# Patient Record
Sex: Female | Born: 1960 | Race: Asian | Hispanic: No | Marital: Married | Smoking: Former smoker
Health system: Southern US, Community
[De-identification: ages and names within clinical notes are randomized; demographics above are authoritative.]

## PROBLEM LIST (undated history)

## (undated) DIAGNOSIS — C541 Malignant neoplasm of endometrium: Secondary | ICD-10-CM

## (undated) DIAGNOSIS — D649 Anemia, unspecified: Secondary | ICD-10-CM

## (undated) DIAGNOSIS — I10 Essential (primary) hypertension: Secondary | ICD-10-CM

## (undated) DIAGNOSIS — C189 Malignant neoplasm of colon, unspecified: Secondary | ICD-10-CM

## (undated) DIAGNOSIS — R42 Dizziness and giddiness: Secondary | ICD-10-CM

## (undated) DIAGNOSIS — J301 Allergic rhinitis due to pollen: Secondary | ICD-10-CM

## (undated) DIAGNOSIS — M1990 Unspecified osteoarthritis, unspecified site: Secondary | ICD-10-CM

## (undated) DIAGNOSIS — T753XXA Motion sickness, initial encounter: Secondary | ICD-10-CM

## (undated) HISTORY — PX: ABDOMINAL HYSTERECTOMY: SHX81

## (undated) HISTORY — DX: Essential (primary) hypertension: I10

## (undated) HISTORY — DX: Allergic rhinitis due to pollen: J30.1

## (undated) HISTORY — DX: Malignant neoplasm of endometrium: C54.1

## (undated) MED FILL — Fosaprepitant Dimeglumine For IV Infusion 150 MG (Base Eq): INTRAVENOUS | Qty: 5 | Status: AC

---

## 2009-01-26 ENCOUNTER — Ambulatory Visit: Payer: Self-pay

## 2009-02-16 ENCOUNTER — Ambulatory Visit: Payer: Self-pay | Admitting: Pain Medicine

## 2009-03-01 ENCOUNTER — Ambulatory Visit: Payer: Self-pay | Admitting: Pain Medicine

## 2009-07-18 ENCOUNTER — Ambulatory Visit: Payer: Self-pay | Admitting: Family Medicine

## 2012-10-06 ENCOUNTER — Other Ambulatory Visit (HOSPITAL_COMMUNITY)
Admission: RE | Admit: 2012-10-06 | Discharge: 2012-10-06 | Disposition: A | Discharge status: Home / Self Care | Payer: BC Managed Care – PPO | Source: Ambulatory Visit | Attending: Family Medicine | Admitting: Family Medicine

## 2012-10-06 ENCOUNTER — Ambulatory Visit (INDEPENDENT_AMBULATORY_CARE_PROVIDER_SITE_OTHER): Payer: BC Managed Care – PPO | Admitting: Family Medicine

## 2012-10-06 ENCOUNTER — Encounter: Payer: Self-pay | Admitting: Family Medicine

## 2012-10-06 VITALS — BP 120/70 | HR 72 | Temp 98.2°F | Ht 62.5 in | Wt 162.5 lb

## 2012-10-06 DIAGNOSIS — R5383 Other fatigue: Secondary | ICD-10-CM

## 2012-10-06 DIAGNOSIS — Z01419 Encounter for gynecological examination (general) (routine) without abnormal findings: Secondary | ICD-10-CM

## 2012-10-06 DIAGNOSIS — Z1239 Encounter for other screening for malignant neoplasm of breast: Secondary | ICD-10-CM

## 2012-10-06 DIAGNOSIS — Z0000 Encounter for general adult medical examination without abnormal findings: Secondary | ICD-10-CM

## 2012-10-06 DIAGNOSIS — Z131 Encounter for screening for diabetes mellitus: Secondary | ICD-10-CM

## 2012-10-06 DIAGNOSIS — R5381 Other malaise: Secondary | ICD-10-CM

## 2012-10-06 DIAGNOSIS — J301 Allergic rhinitis due to pollen: Secondary | ICD-10-CM | POA: Insufficient documentation

## 2012-10-06 DIAGNOSIS — Z13220 Encounter for screening for lipoid disorders: Secondary | ICD-10-CM

## 2012-10-06 LAB — CBC WITH DIFFERENTIAL/PLATELET
Eosinophils Relative: 1.2 % (ref 0.0–5.0)
HCT: 27.9 % — ABNORMAL LOW (ref 36.0–46.0)
Lymphs Abs: 1.3 10*3/uL (ref 0.7–4.0)
Monocytes Relative: 9.1 % (ref 3.0–12.0)
Platelets: 314 10*3/uL (ref 150.0–400.0)
WBC: 4 10*3/uL — ABNORMAL LOW (ref 4.5–10.5)

## 2012-10-06 LAB — LDL CHOLESTEROL, DIRECT: Direct LDL: 127 mg/dL

## 2012-10-06 LAB — HEPATIC FUNCTION PANEL
ALT: 24 U/L (ref 0–35)
AST: 20 U/L (ref 0–37)
Alkaline Phosphatase: 32 U/L — ABNORMAL LOW (ref 39–117)
Bilirubin, Direct: 0 mg/dL (ref 0.0–0.3)
Total Bilirubin: 0.4 mg/dL (ref 0.3–1.2)

## 2012-10-06 LAB — BASIC METABOLIC PANEL
BUN: 10 mg/dL (ref 6–23)
GFR: 93.22 mL/min (ref >60.00)
Potassium: 3.8 mEq/L (ref 3.5–5.1)
Sodium: 140 mEq/L (ref 135–145)

## 2012-10-06 NOTE — Progress Notes (Signed)
Nature conservation officer at [facility] [address] [place] [address] Phone: [phone] Fax: [phone]  Date:  10/06/2012   Name:  Jaime Johnson   DOB:  1961/03/27   MRN:  [id number] Gender: female Age: 51 y.o.  PCP:  Hannah Beat, MD  Evaluating MD: Hannah Beat, MD   Chief Complaint: Establish Care   History of Present Illness:  Jaime Johnson is a 51 y.o. pleasant patient who presents with the following:  Used to go to Dr. Lorri Frederick in [place], had all checkups with him. Needs a mammogram.  Colonoscopy - declines referral.  Mammo - needs referral.   Health Maintenance Summary Reviewed and updated, unless pt declines services.  Tobacco History Reviewed. Non-smoker  Alcohol: No concerns, no excessive use Exercise Habits: Some activity STD concerns: none Drug Use: None Lumps or breast concerns: no  Patient Active Problem List  Diagnosis  . Allergic rhinitis due to pollen    Past Medical History  Diagnosis Date  . Allergic rhinitis due to pollen     Past Surgical History  Procedure Date  . Cesarean section     History  Substance Use Topics  . Smoking status: Former Games developer  . Smokeless tobacco: Not on file  . Alcohol Use: No    Family History  Problem Relation Age of Onset  . Hepatitis C Mother 60    d/c at 50  . Hepatitis C Brother   . Hypertension Father     No Known Allergies  Current Outpatient Prescriptions on File Prior to Visit  Medication Sig Dispense Refill  . loratadine (ALAVERT) 10 MG tablet Take 10 mg by mouth daily.         Review of Systems:   General: Denies fever, chills, sweats. No significant weight loss. Eyes: Denies blurring,significant itching ENT: Denies earache, sore throat, and hoarseness.  Cardiovascular: Denies chest pains, palpitations, dyspnea on exertion,  Respiratory: Denies cough, dyspnea at rest,wheeezing Breast: no concerns about lumps GI: Denies nausea, vomiting, diarrhea, constipation, change  in bowel habits, abdominal pain, melena, hematochezia GU: Denies dysuria, hematuria, urinary hesitancy, nocturia, denies STD risk, no concerns about discharge Musculoskeletal: Denies back pain, joint pain Derm: Denies rash, itching Neuro: Denies  paresthesias, frequent falls, frequent headaches Psych: Denies depression, anxiety Endocrine: Denies cold intolerance, heat intolerance, polydipsia Heme: Denies enlarged lymph nodes Allergy: No hayfever  Physical Examination: Filed Vitals:   10/06/12 1352  BP: 120/70  Pulse: 72  Temp: 98.2 F (36.8 C)  TempSrc: Oral  Height: 5' 2.5" (1.588 m)  Weight: 162 lb 8 oz (73.71 kg)  SpO2: 99%    Body mass index is 29.25 kg/(m^2). Ideal Body Weight: Weight in (lb) to have BMI = 25: 138.6    Wt Readings from Last 3 Encounters:  10/06/12 162 lb 8 oz (73.71 kg)    GEN: well developed, well nourished, no acute distress Eyes: conjunctiva and lids normal, PERRLA, EOMI ENT: TM clear, nares clear, oral exam WNL Neck: supple, no lymphadenopathy, no thyromegaly, no JVD Pulm: clear to auscultation and percussion, respiratory effort normal CV: regular rate and rhythm, S1-S2, no murmur, rub or gallop, no bruits Chest: no scars, masses, no lumps BREAST: no lumps, no axillary LAD, no nipple discharge GI: soft, non-tender; no hepatosplenomegaly, masses; active bowel sounds all quadrants GU: Normal external female genitalia. Cervix appears intact without lesions or irritation. Vaginal canal normal without ulceration or lesion. Cervix NT to exam. Ovaries neither enlarged nor tender. Lymph: no cervical, axillary or inguinal  adenopathy MSK: gait normal, muscle tone and strength WNL, no joint swelling, effusions, discoloration, crepitus  SKIN: clear, good turgor, color WNL, no rashes, lesions, or ulcerations Neuro: normal mental status, normal strength, sensation, and motion Psych: alert; oriented to person, place and time, normally interactive and not  anxious or depressed in appearance.  Assessment and Plan:  1. Routine general medical examination at a health care facility    2. Breast screening, unspecified  MM Digital Screening  3. Routine gynecological examination  Cytology - PAP  4. Screening for lipoid disorders    5. Screening for diabetes mellitus    6. Allergic rhinitis due to pollen     The patient's preventative maintenance and recommended screening tests for an annual wellness exam were reviewed in full today. Brought up to date unless services declined.  Counselled on the importance of diet, exercise, and its role in overall health and mortality. The patient's FH and SH was reviewed, including their home life, tobacco status, and drug and alcohol status.    Orders Today:  Orders Placed This Encounter  Procedures  . MM Digital Screening    Standing Status: Future     Number of Occurrences:      Standing Expiration Date: 12/06/2013    Order Specific Question:  Is the patient pregnant?    Answer:  No    Order Specific Question:  Preferred imaging location?    Answer:  External    Order Specific Question:  Reason for exam:    Answer:  [facility], screening mammo    Updated Medication List: (Includes new medications, updates to list, dose adjustments) Meds ordered this encounter  Medications  . loratadine (ALAVERT) 10 MG tablet    Sig: Take 10 mg by mouth daily.    Medications Discontinued: There are no discontinued medications.   Hannah Beat, MD

## 2012-10-06 NOTE — Addendum Note (Signed)
Addended by: Hannah Beat on: 10/06/2012 02:39 PM   Modules accepted: Orders

## 2012-10-07 ENCOUNTER — Ambulatory Visit: Payer: BC Managed Care – PPO

## 2012-10-07 DIAGNOSIS — D649 Anemia, unspecified: Secondary | ICD-10-CM

## 2012-10-07 LAB — IBC PANEL: Saturation Ratios: 2.4 % — ABNORMAL LOW (ref 20.0–50.0)

## 2012-10-13 ENCOUNTER — Encounter: Payer: Self-pay | Admitting: Family Medicine

## 2012-10-13 ENCOUNTER — Ambulatory Visit: Payer: Self-pay | Admitting: Family Medicine

## 2012-11-14 ENCOUNTER — Telehealth: Payer: Self-pay | Admitting: Family Medicine

## 2012-11-14 NOTE — Telephone Encounter (Signed)
Patient Information:  Caller Name: Early[place]  Phone: [phone]  Patient: Jaime Johnson, Jaime Johnson  Gender: Female  DOB: 04/10/1969  Age: 52 Years  PCP: Hannah Beat (Family Practice)  Pregnant: No  Office Follow Up:  Does the office need to follow up with this patient?: No  Instructions For The Office: N/A   Symptoms  Reason For Call & Symptoms: Pt calling that she has a cold x 1 week, since 11/07/12.   Started with a sore throat  and now she has a cough and is coughing so hard she is having urinary leakage.  She has tried some OTC couch meds and Alka Seltzer cold med.   She is calling to get an antibiotic and a cough med like Tussionex.  Can anything be called in for her?   Reviewed Health History In EMR: Yes  Reviewed Medications In EMR: Yes  Reviewed Allergies In EMR: N/A  Reviewed Surgeries / Procedures: Yes  Date of Onset of Symptoms: 11/07/2012  Treatments Tried: OTC cough meds and Alka Seltzer Cold  Treatments Tried Worked: No OB / GYN:  LMP: 10/28/2012  Guideline(s) Used:  Cough  Disposition Per Guideline:   See Today or Tomorrow in Office  Reason For Disposition Reached:   Continuous (nonstop) coughing interferes with work or school and no improvement using cough treatment per Care Advice  Advice Given:  Home care advice given for cough, steam, push fluids, rest and monitor temp. Call back instructions given.  Patient Refused Recommendation:  Patient Requests Prescription  Pt requesting an antibiotic and something like Tussionex for cough.  She does not wnat to go out in the cold to be seen because that makes the cough worse.

## 2012-11-17 NOTE — Telephone Encounter (Signed)
If she is still symptomatic, she should have office visit to check out -- sorry, but against office policy to ever call in ABX over phone

## 2012-11-17 NOTE — Telephone Encounter (Signed)
Patient advised via message on machine she will need appt before antibiotics can be given

## 2014-05-26 ENCOUNTER — Encounter: Payer: Self-pay | Admitting: Family Medicine

## 2014-05-26 ENCOUNTER — Ambulatory Visit (INDEPENDENT_AMBULATORY_CARE_PROVIDER_SITE_OTHER): Payer: BC Managed Care – PPO | Admitting: Family Medicine

## 2014-05-26 VITALS — BP 130/72 | HR 68 | Temp 97.8°F | Ht 62.5 in | Wt 153.5 lb

## 2014-05-26 DIAGNOSIS — R5381 Other malaise: Secondary | ICD-10-CM

## 2014-05-26 DIAGNOSIS — Z1211 Encounter for screening for malignant neoplasm of colon: Secondary | ICD-10-CM

## 2014-05-26 DIAGNOSIS — Z0000 Encounter for general adult medical examination without abnormal findings: Secondary | ICD-10-CM

## 2014-05-26 DIAGNOSIS — N950 Postmenopausal bleeding: Secondary | ICD-10-CM

## 2014-05-26 DIAGNOSIS — Z13220 Encounter for screening for lipoid disorders: Secondary | ICD-10-CM

## 2014-05-26 DIAGNOSIS — R5383 Other fatigue: Secondary | ICD-10-CM

## 2014-05-26 DIAGNOSIS — D62 Acute posthemorrhagic anemia: Secondary | ICD-10-CM

## 2014-05-26 LAB — CBC WITH DIFFERENTIAL/PLATELET
BASOS ABS: 0 10*3/uL (ref 0.0–0.1)
Basophils Relative: 0.4 % (ref 0.0–3.0)
Eosinophils Absolute: 0 10*3/uL (ref 0.0–0.7)
Eosinophils Relative: 0.7 % (ref 0.0–5.0)
HEMATOCRIT: 41 % (ref 36.0–46.0)
Hemoglobin: 14 g/dL (ref 12.0–15.0)
LYMPHS ABS: 1.5 10*3/uL (ref 0.7–4.0)
Lymphocytes Relative: 22.5 % (ref 12.0–46.0)
MCHC: 34 g/dL (ref 30.0–36.0)
MCV: 78.9 fl (ref 78.0–100.0)
MONOS PCT: 6.4 % (ref 3.0–12.0)
Monocytes Absolute: 0.4 10*3/uL (ref 0.1–1.0)
Neutro Abs: 4.6 10*3/uL (ref 1.4–7.7)
Neutrophils Relative %: 70 % (ref 43.0–77.0)
PLATELETS: 251 10*3/uL (ref 150.0–400.0)
RBC: 5.2 Mil/uL — ABNORMAL HIGH (ref 3.87–5.11)
RDW: 16.4 % — AB (ref 11.5–15.5)
WBC: 6.5 10*3/uL (ref 4.0–10.5)

## 2014-05-26 LAB — HEPATIC FUNCTION PANEL
ALK PHOS: 35 U/L — AB (ref 39–117)
ALT: 26 U/L (ref 0–35)
AST: 25 U/L (ref 0–37)
Albumin: 3.9 g/dL (ref 3.5–5.2)
BILIRUBIN DIRECT: 0 mg/dL (ref 0.0–0.3)
TOTAL PROTEIN: 6.9 g/dL (ref 6.0–8.3)
Total Bilirubin: 0.4 mg/dL (ref 0.2–1.2)

## 2014-05-26 LAB — BASIC METABOLIC PANEL
BUN: 9 mg/dL (ref 6–23)
CALCIUM: 8.9 mg/dL (ref 8.4–10.5)
CO2: 26 meq/L (ref 19–32)
Chloride: 103 mEq/L (ref 96–112)
Creatinine, Ser: 0.9 mg/dL (ref 0.4–1.2)
GFR: 74.04 mL/min (ref >60.00)
Glucose, Bld: 93 mg/dL (ref 70–99)
Potassium: 4.1 mEq/L (ref 3.5–5.1)
SODIUM: 138 meq/L (ref 135–145)

## 2014-05-26 LAB — FERRITIN: Ferritin: 10.2 ng/mL (ref 10.0–291.0)

## 2014-05-26 LAB — LIPID PANEL
CHOL/HDL RATIO: 4
Cholesterol: 209 mg/dL — ABNORMAL HIGH (ref 0–200)
HDL: 56.2 mg/dL (ref >39.00)
NONHDL: 152.8
Triglycerides: 248 mg/dL — ABNORMAL HIGH (ref 0.0–149.0)
VLDL: 49.6 mg/dL — AB (ref 0.0–40.0)

## 2014-05-26 LAB — LDL CHOLESTEROL, DIRECT: Direct LDL: 128 mg/dL

## 2014-05-26 LAB — IBC PANEL
Iron: 65 ug/dL (ref 42–145)
Saturation Ratios: 12.7 % — ABNORMAL LOW (ref 20.0–50.0)
Transferrin: 366.7 mg/dL — ABNORMAL HIGH (ref 212.0–360.0)

## 2014-05-26 LAB — TSH: TSH: 2.65 u[IU]/mL (ref 0.35–4.50)

## 2014-05-26 NOTE — Progress Notes (Signed)
[place] [address]                Phone: [phone]                  Fax: [phone] 05/26/2014  ID: JIAH BARI   MRN: [id number]  DOB: 1961-08-19  Primary Physician:  Owens Loffler, MD  Chief Complaint: Annual Exam and Menorrhagia  Subjective:   History of Present Illness:  Jaime Johnson is a 53 y.o. pleasant patient who presents with the following:  Health Maintenance Summary Reviewed and updated, unless pt declines services.  Tobacco History Reviewed. Non-smoker Alcohol: No concerns, no excessive use Exercise Habits: Some activity, rec at least 30 mins 5 times a week STD concerns: none Drug Use: None Birth control method: none Menses regular: no see below Lumps or breast concerns: no Breast Cancer Family History: no  Health Maintenance  Topic Date Due  . Tetanus/tdap  04/11/1979  . Mammogram  04/10/2010  . Influenza Vaccine  05/22/2014  . Colonoscopy  05/29/2015 (Originally 04/10/2010)  . Pap Smear  10/07/2015   No Mammo, no pap Colon - declines Labs needed  Post-menopausal bleeding vs. Menorrhagia: Bleeding 3 weeks out of the month. For about a year. Heavy almost all the time. More regular, extended period.  - bleeding more in 50's than in her 68's.  Occ dizzy sometimes.  H/o significant anemia last year.  Has been taking daily iron, and she is feeling better.     There is no immunization history on file for this patient.  Patient Active Problem List   Diagnosis Date Noted  . Acute blood loss anemia 05/26/2014  . Allergic rhinitis due to pollen    Past Medical History  Diagnosis Date  . Allergic rhinitis due to pollen    Past Surgical History  Procedure Laterality Date  . Cesarean section     History   Social History  . Marital Status: Married    Spouse Name: N/A    Number of Children: N/A  . Years of Education: N/A   Occupational History  . Not on file.   Social History Main Topics  .  Smoking status: Former Research scientist (life sciences)  . Smokeless tobacco: Not on file  . Alcohol Use: No  . Drug Use: No  . Sexual Activity: Not on file   Other Topics Concern  . Not on file   Social History Narrative  . No narrative on file   Family History  Problem Relation Age of Onset  . Hepatitis C Mother 7    d/c at 57  . Hepatitis C Brother   . Hypertension Father    No Known Allergies Medication list has been reviewed and updated.  Review of Systems:   General: Denies fever, chills, sweats. No significant weight loss. Eyes: Denies blurring,significant itching ENT: Denies earache, sore throat, and hoarseness.  Cardiovascular: Denies chest pains, palpitations, dyspnea on exertion,  Respiratory: Denies cough, dyspnea at rest,wheeezing Breast: no concerns about lumps GI: Denies nausea, vomiting, diarrhea, constipation, change in bowel habits, abdominal pain, melena, hematochezia GU: Denies dysuria, hematuria, urinary hesitancy, nocturia, denies STD risk, no concerns about discharge Musculoskeletal: Denies back pain, joint pain Derm: Denies rash, itching Neuro: Denies  paresthesias, frequent falls, frequent headaches Psych: Denies depression, anxiety Endocrine: Denies cold intolerance, heat intolerance, polydipsia Heme: Denies enlarged  lymph nodes Allergy: No hayfever  Objective:   Physical Examination: BP 130/72  Pulse 68  Temp(Src) 97.8 F (36.6 C) (Oral)  Ht 5' 2.5" (1.588 m)  Wt 153 lb 8 oz (69.627 kg)  BMI 27.61 kg/m2  SpO2 98%  LMP 05/26/2014  No exam data present  GEN: well developed, well nourished, no acute distress Eyes: conjunctiva and lids normal, PERRLA, EOMI ENT: TM clear, nares clear, oral exam WNL Neck: supple, no lymphadenopathy, no thyromegaly, no JVD Pulm: clear to auscultation and percussion, respiratory effort normal CV: regular rate and rhythm, S1-S2, no murmur, rub or gallop, no bruits Chest: no scars, masses, no lumps BREAST: breast exam  declined GI: soft, non-tender; no hepatosplenomegaly, masses; active bowel sounds all quadrants GU: GU exam declined Lymph: no cervical, axillary or inguinal adenopathy MSK: gait normal, muscle tone and strength WNL, no joint swelling, effusions, discoloration, crepitus  SKIN: clear, good turgor, color WNL, no rashes, lesions, or ulcerations Neuro: normal mental status, normal strength, sensation, and motion Psych: alert; oriented to person, place and time, normally interactive and not anxious or depressed in appearance.   All labs reviewed with patient. Lipids:    Component Value Date/Time   CHOL 209* 05/26/2014 1004   TRIG 248.0* 05/26/2014 1004   HDL 56.20 05/26/2014 1004   LDLDIRECT 128.0 05/26/2014 1004   VLDL 49.6* 05/26/2014 1004   CHOLHDL 4 05/26/2014 1004   CBC: CBC Latest Ref Rng 05/26/2014 10/06/2012  WBC 4.0 - 10.5 K/uL 6.5 4.0(L)  Hemoglobin 12.0 - 15.0 g/dL 14.0 8.6 Repeated and verified X2.(L)  Hematocrit 36.0 - 46.0 % 41.0 27.9(L)  Platelets 150.0 - 400.0 K/uL 251.0 329.9    Basic Metabolic Panel:    Component Value Date/Time   NA 138 05/26/2014 1004   K 4.1 05/26/2014 1004   CL 103 05/26/2014 1004   CO2 26 05/26/2014 1004   BUN 9 05/26/2014 1004   CREATININE 0.9 05/26/2014 1004   GLUCOSE 93 05/26/2014 1004   CALCIUM 8.9 05/26/2014 1004   Hepatic Function Latest Ref Rng 05/26/2014 10/06/2012  Total Protein 6.0 - 8.3 g/dL 6.9 7.0  Albumin 3.5 - 5.2 g/dL 3.9 4.2  AST 0 - 37 U/L 25 20  ALT 0 - 35 U/L 26 24  Alk Phosphatase 39 - 117 U/L 35(L) 32(L)  Total Bilirubin 0.2 - 1.2 mg/dL 0.4 0.4  Bilirubin, Direct 0.0 - 0.3 mg/dL 0.0 0.0    Lab Results  Component Value Date   TSH 2.65 05/26/2014    No results found.  Assessment & Plan:   Routine general medical examination at a health care facility  Special screening for malignant neoplasms, colon - Plan: Ambulatory referral to Gastroenterology, initially, the patient was not interested in pursuing colonoscopy, and after our  discussion she decided that this was a positive thing that could impact her health, so we have made a referral for this.  Acute blood loss anemia - Plan: Ferritin, IBC panel: anemia is improved from a hemoglobin of 8 last year. Ferritin is still borderline low.  Other malaise and fatigue - Plan: CBC with Differential, Basic metabolic panel, Hepatic function panel, TSH  Screening for lipoid disorders - Plan: Lipid panel, Perlon elevated cholesterol.  Postmenopausal bleeding - Plan: Ambulatory referral to Gynecology: I discussed all with the patient, and given her age at age 78 with such excessive bleeding, she needs to have a gynecological evaluation. Recommended gynecological evaluation and potential ultrasound and potential endometrial biopsy. I appreciate their assistance.  Health Maintenance Exam: The patient's preventative maintenance and recommended screening tests for an annual wellness exam were reviewed in full today. Brought up to date unless services declined.  Counselled on the importance of diet, exercise, and its role in overall health and mortality. The patient's FH and SH was reviewed, including their home life, tobacco status, and drug and alcohol status.  Follow-up: No Follow-up on file. Or follow-up in 1 year for complete physical examination  New Prescriptions   No medications on file   Discontinued Medications   LORATADINE (ALAVERT) 10 MG TABLET    Take 10 mg by mouth daily.   Modified Medications   No medications on file   Signed,  Maud Deed. Rushton Early, MD, [facility]  Orders Placed This Encounter  Procedures  . CBC with Differential  . Basic metabolic panel  . Hepatic function panel  . Lipid panel  . TSH  . Ferritin  . IBC panel  . LDL cholesterol, direct  . Ambulatory referral to Gastroenterology  . Ambulatory referral to Gynecology   Current Medications at Discharge:   Medication List    Notice As of 05/26/2014 11:59 PM   You have not  been prescribed any medications.

## 2014-05-26 NOTE — Progress Notes (Signed)
Pre visit review using our clinic review tool, if applicable. No additional management support is needed unless otherwise documented below in the visit note. 

## 2014-05-27 ENCOUNTER — Encounter: Payer: Self-pay | Admitting: *Deleted

## 2014-06-08 ENCOUNTER — Ambulatory Visit (INDEPENDENT_AMBULATORY_CARE_PROVIDER_SITE_OTHER): Payer: Self-pay | Admitting: Obstetrics & Gynecology

## 2014-06-08 ENCOUNTER — Encounter: Payer: Self-pay | Admitting: Obstetrics & Gynecology

## 2014-06-08 VITALS — BP 145/95 | HR 72 | Ht 62.0 in | Wt 156.0 lb

## 2014-06-08 DIAGNOSIS — N939 Abnormal uterine and vaginal bleeding, unspecified: Secondary | ICD-10-CM

## 2014-06-08 DIAGNOSIS — N926 Irregular menstruation, unspecified: Secondary | ICD-10-CM

## 2014-06-08 DIAGNOSIS — N852 Hypertrophy of uterus: Secondary | ICD-10-CM

## 2014-06-08 MED ORDER — MEGESTROL ACETATE 40 MG PO TABS: 40.0000 mg | ORAL_TABLET | Freq: Two times a day (BID) | ORAL | Status: DC

## 2014-06-08 NOTE — Patient Instructions (Signed)
Dysfunctional Uterine Bleeding Normally, menstrual periods begin between ages 11 to 17 in young women. A normal menstrual cycle/period may begin every 23 days up to 35 days and lasts from 1 to 7 days. Around 12 to 14 days before your menstrual period starts, ovulation (ovary produces an egg) occurs. When counting the time between menstrual periods, count from the first day of bleeding of the previous period to the first day of bleeding of the next period. Dysfunctional (abnormal) uterine bleeding is bleeding that is different from a normal menstrual period. Your periods may come earlier or later than usual. They may be lighter, have blood clots or be heavier. You may have bleeding between periods, or you may skip one period or more. You may have bleeding after sexual intercourse, bleeding after menopause, or no menstrual period. CAUSES   Pregnancy (normal, miscarriage, tubal).  IUDs (intrauterine device, birth control).  Birth control pills.  Hormone treatment.  Menopause.  Infection of the cervix.  Blood clotting problems.  Infection of the inside lining of the uterus.  Endometriosis, inside lining of the uterus growing in the pelvis and other female organs.  Adhesions (scar tissue) inside the uterus.  Obesity or severe weight loss.  Uterine polyps inside the uterus.  Cancer of the vagina, cervix, or uterus.  Ovarian cysts or polycystic ovary syndrome.  Medical problems (diabetes, thyroid disease).  Uterine fibroids (noncancerous tumor).  Problems with your female hormones.  Endometrial hyperplasia, very thick lining and enlarged cells inside of the uterus.  Medicines that interfere with ovulation.  Radiation to the pelvis or abdomen.  Chemotherapy. DIAGNOSIS   Your doctor will discuss the history of your menstrual periods, medicines you are taking, changes in your weight, stress in your life, and any medical problems you may have.  Your doctor will do a physical  and pelvic examination.  Your doctor may want to perform certain tests to make a diagnosis, such as:  Pap test.  Blood tests.  Cultures for infection.  CT scan.  Ultrasound.  Hysteroscopy.  Laparoscopy.  MRI.  Hysterosalpingography.  D and C.  Endometrial biopsy. TREATMENT  Treatment will depend on the cause of the dysfunctional uterine bleeding (DUB). Treatment may include:  Observing your menstrual periods for a couple of months.  Prescribing medicines for medical problems, including:  Antibiotics.  Hormones.  Birth control pills.  Removing an IUD (intrauterine device, birth control).  Surgery:  D and C (scrape and remove tissue from inside the uterus).  Laparoscopy (examine inside the abdomen with a lighted tube).  Uterine ablation (destroy lining of the uterus with electrical current, laser, heat, or freezing).  Hysteroscopy (examine cervix and uterus with a lighted tube).  Hysterectomy (remove the uterus). HOME CARE INSTRUCTIONS   If medicines were prescribed, take exactly as directed. Do not change or switch medicines without consulting your caregiver.  Long term heavy bleeding may result in iron deficiency. Your caregiver may have prescribed iron pills. They help replace the iron that your body lost from heavy bleeding. Take exactly as directed.  Do not take aspirin or medicines that contain aspirin one week before or during your menstrual period. Aspirin may make the bleeding worse.  If you need to change your sanitary pad or tampon more than once every 2 hours, stay in bed with your feet elevated and a cold pack on your lower abdomen. Rest as much as possible, until the bleeding stops or slows down.  Eat well-balanced meals. Eat foods high in iron. Examples   are:  Leafy green vegetables.  Whole-grain breads and cereals.  Eggs.  Meat.  Liver.  Do not try to lose weight until the abnormal bleeding has stopped and your blood iron level is  back to normal. Do not lift more than ten pounds or do strenuous activities when you are bleeding.  For a couple of months, make note on your calendar, marking the start and ending of your period, and the type of bleeding (light, medium, heavy, spotting, clots or missed periods). This is for your caregiver to better evaluate your problem. SEEK MEDICAL CARE IF:   You develop nausea (feeling sick to your stomach) and vomiting, dizziness, or diarrhea while you are taking your medicine.  You are getting lightheaded or weak.  You have any problems that may be related to the medicine you are taking.  You develop pain with your DUB.  You want to remove your IUD.  You want to stop or change your birth control pills or hormones.  You have any type of abnormal bleeding mentioned above.  You are over 16 years old and have not had a menstrual period yet.  You are 54 years old and you are still having menstrual periods.  You have any of the symptoms mentioned above.  You develop a rash. SEEK IMMEDIATE MEDICAL CARE IF:   An oral temperature above 102 F (38.9 C) develops.  You develop chills.  You are changing your sanitary pad or tampon more than once an hour.  You develop abdominal pain.  You pass out or faint. Document Released: 10/05/2000 Document Revised: 12/31/2011 Document Reviewed: 09/06/2009 ExitCare Patient Information 2015 [employer]. This information is not intended to replace advice given to you by your health care provider. Make sure you discuss any questions you have with your health care provider.  

## 2014-06-08 NOTE — Progress Notes (Signed)
   CLINIC ENCOUNTER NOTE  History:  53 y.o. G1P1001 here today for evaluation of AUB x 1 year. She had an episode of AUB in her 26s, had negative endometrial biopsy and evaluation.  Saw her PCP recently; Hemoglobin 14, Ferritin 10.2, normal TSH. Patient is taking oral iron therapy.  Bleeding is variable can have lots of clots one day and spotting the next; but always has some bleeding for most days in a month.  No symptoms of anemia. No history of any gynecologic disorders.  The following portions of the patient's history were reviewed and updated as appropriate: allergies, current medications, past family history, past medical history, past social history, past surgical history and problem list. Normal pap in 09/2012.  Review of Systems:  Pertinent items are noted in HPI.  Objective:  Physical Exam BP 145/95  Pulse 72  Ht 5\' 2"  (1.575 m)  Wt 156 lb (70.761 kg)  BMI 28.53 kg/m2  LMP 05/17/2014 Gen: NAD Abd: Soft, nontender and nondistended Pelvic: Normal appearing external genitalia; normal appearing vaginal mucosa and cervix.  Moderate amount of blood in vault, no active bleeding from cervix.  15 week sized uterus, no other palpable masses, no uterine or adnexal tenderness  Assessment & Plan:  Pelvic ultrasound ordered for evaluation of enlarged uterus Megace ordered for bleeding Patient declines endometrial biopsy today, will consider it at next visit Bleeding precautions reviewed.    Verita Schneiders, MD, [facility] Attending [place] for [employer], [place]

## 2014-06-18 ENCOUNTER — Ambulatory Visit (HOSPITAL_COMMUNITY)
Admission: RE | Admit: 2014-06-18 | Discharge: 2014-06-18 | Disposition: A | Discharge status: Home / Self Care | Payer: Self-pay | Source: Ambulatory Visit | Attending: Obstetrics & Gynecology | Admitting: Obstetrics & Gynecology

## 2014-06-18 DIAGNOSIS — N938 Other specified abnormal uterine and vaginal bleeding: Secondary | ICD-10-CM | POA: Insufficient documentation

## 2014-06-18 DIAGNOSIS — N939 Abnormal uterine and vaginal bleeding, unspecified: Secondary | ICD-10-CM

## 2014-06-18 DIAGNOSIS — N852 Hypertrophy of uterus: Secondary | ICD-10-CM

## 2014-06-18 DIAGNOSIS — N949 Unspecified condition associated with female genital organs and menstrual cycle: Secondary | ICD-10-CM | POA: Insufficient documentation

## 2014-06-22 ENCOUNTER — Ambulatory Visit (INDEPENDENT_AMBULATORY_CARE_PROVIDER_SITE_OTHER): Payer: Self-pay | Admitting: Obstetrics & Gynecology

## 2014-06-22 ENCOUNTER — Encounter: Payer: Self-pay | Admitting: Obstetrics & Gynecology

## 2014-06-22 VITALS — BP 130/100 | HR 78 | Ht 62.0 in | Wt 156.0 lb

## 2014-06-22 DIAGNOSIS — Z712 Person consulting for explanation of examination or test findings: Secondary | ICD-10-CM

## 2014-06-22 DIAGNOSIS — Z7189 Other specified counseling: Secondary | ICD-10-CM

## 2014-06-22 DIAGNOSIS — N939 Abnormal uterine and vaginal bleeding, unspecified: Secondary | ICD-10-CM

## 2014-06-22 DIAGNOSIS — N926 Irregular menstruation, unspecified: Secondary | ICD-10-CM

## 2014-06-22 NOTE — Progress Notes (Signed)
   CLINIC ENCOUNTER NOTE  History:  53 y.o. G1P1001 here today for follow up ultrasound results for AUB. Had heavy bleeding episode treated with Megace. No other symptoms.  The following portions of the patient's history were reviewed and updated as appropriate: allergies, current medications, past family history, past medical history, past social history, past surgical history and problem list.  Review of Systems:  Pertinent items are noted in HPI.  Objective:  BP 130/100  Pulse 78  Ht 5\' 2"  (1.575 m)  Wt 156 lb (70.761 kg)  BMI 28.53 kg/m2  LMP 05/17/2014 Physical Exam deferred  Labs and Imaging 06/18/2014    TRANSABDOMINAL AND TRANSVAGINAL ULTRASOUND OF PELVIS  CLINICAL DATA:  Abnormal uterine bleeding x1 year TECHNIQUE: Both transabdominal and transvaginal ultrasound examinations of the pelvis were performed. Transabdominal technique was performed for global imaging of the pelvis including uterus, ovaries, adnexal regions, and pelvic cul-de-sac. It was necessary to proceed with endovaginal exam following the transabdominal exam to visualize the endometrium.  COMPARISON:  None  FINDINGS: Uterus  Measurements: 11.7 x 5.7 x 6.9 cm. No fibroids or other mass visualized.  Endometrium  Thickness: 15 mm.  No focal abnormality visualized.  Right ovary  Measurements: 3.5 x 1.6 x 1.5 cm. Normal appearance/no adnexal mass.  Left ovary  Measurements: 3.0 x 1.5 x 1.8 cm. Normal appearance/no adnexal mass.  Other findings  No free fluid.  IMPRESSION: Negative pelvic ultrasound.   Electronically Signed   By: Julian Hy M.D.   On: 06/18/2014 12:10   Assessment & Plan:  Discussed other management options for abnormal uterine bleeding including Mirena IUD, endometrial ablation (Novasure/Hydrothermal Ablation) or hysterectomy as definitive surgical management.  Discussed risks and benefits of each method.  Printed patient education handouts were given to the patient to review at home.  Patient desires  to continue Megace for now,  bleeding precautions reviewed.     Verita Schneiders, MD, [place] Attending [place] for [employer], [place]

## 2014-06-22 NOTE — Patient Instructions (Signed)
Endometrial Ablation Endometrial ablation removes the lining of the uterus (endometrium). It is usually a same-day, outpatient treatment. Ablation helps avoid major surgery, such as surgery to remove the cervix and uterus (hysterectomy). After endometrial ablation, you will have little or no menstrual bleeding and may not be able to have children. However, if you are premenopausal, you will need to use a reliable method of birth control following the procedure because of the small chance that pregnancy can occur. There are different reasons to have this procedure, which include:  Heavy periods.  Bleeding that is causing anemia.  Irregular bleeding.  Bleeding fibroids on the lining inside the uterus if they are smaller than 3 centimeters. This procedure should not be done if:  You want children in the future.  You have severe cramps with your menstrual period.  You have precancerous or cancerous cells in your uterus.  You were recently pregnant.  You have gone through menopause.  You have had major surgery on the uterus, such as a cesarean delivery. LET YOUR HEALTH CARE PROVIDER KNOW ABOUT:  Any allergies you have.  All medicines you are taking, including vitamins, herbs, eye drops, creams, and over-the-counter medicines.  Previous problems you or members of your family have had with the use of anesthetics.  Any blood disorders you have.  Previous surgeries you have had.  Medical conditions you have. RISKS AND COMPLICATIONS  Generally, this is a safe procedure. However, as with any procedure, complications can occur. Possible complications include:  Perforation of the uterus.  Bleeding.  Infection of the uterus, bladder, or vagina.  Injury to surrounding organs.  An air bubble to the lung (air embolus).  Pregnancy following the procedure.  Failure of the procedure to help the problem, requiring hysterectomy.  Decreased ability to diagnose cancer in the lining of  the uterus. BEFORE THE PROCEDURE  The lining of the uterus must be tested to make sure there is no pre-cancerous or cancer cells present.  An ultrasound may be performed to look at the size of the uterus and to check for abnormalities.  Medicines may be given to thin the lining of the uterus. PROCEDURE  During the procedure, your health care provider will use a tool called a resectoscope to help see inside your uterus. There are different ways to remove the lining of your uterus.   Radiofrequency - This method uses a radiofrequency-alternating electric current to remove the lining of the uterus.  Cryotherapy - This method uses extreme cold to freeze the lining of the uterus.  Heated-Free Liquid - This method uses heated salt (saline) solution to remove the lining of the uterus.  Microwave - This method uses high-energy microwaves to heat up the lining of the uterus to remove it.  Thermal balloon - This method involves inserting a catheter with a balloon tip into the uterus. The balloon tip is filled with heated fluid to remove the lining of the uterus. AFTER THE PROCEDURE  After your procedure, do not have sexual intercourse or insert anything into your vagina until permitted by your health care provider. After the procedure, you may experience:  Cramps.  Vaginal discharge.  Frequent urination. Document Released: 08/17/2004 Document Revised: 06/10/2013 Document Reviewed: 03/11/2013 ExitCare Patient Information 2015 [employer]. This information is not intended to replace advice given to you by your health care provider. Make sure you discuss any questions you have with your health care provider.  

## 2014-08-23 ENCOUNTER — Encounter: Payer: Self-pay | Admitting: Obstetrics & Gynecology

## 2015-05-17 ENCOUNTER — Telehealth: Payer: Self-pay

## 2015-05-17 NOTE — Telephone Encounter (Signed)
Left a voicemail for patient in regards to scheduling a Mammogram.  

## 2016-06-28 ENCOUNTER — Telehealth: Payer: Self-pay | Admitting: Family Medicine

## 2016-06-28 NOTE — Telephone Encounter (Signed)
Pt has appt with Dr Diona Browner on 06/29/16 at 9 AM.

## 2016-06-28 NOTE — Telephone Encounter (Signed)
Patient Name: Jaime Johnson DOB: 12-15-1960 Initial Comment Caller states she was in [facility] 3 wks ago, dx pneumonia and shingles. Rx z-pack, shingles gone, still has cough. Went to UC again, pneumonia gone, but has a lot of mucus, also noticed heart enlarged. bp 190/90 at the time. SOB right now and coughing a lot, dizzy. Nurse Assessment Nurse: Ronnald Ramp, RN, [place] Date/Time (Eastern Time): 06/28/2016 10:36:44 AM Confirm and document reason for call. If symptomatic, describe symptoms. You must click the next button to save text entered. ---Caller states she was seen in UC 3 weeks ago and treated with antibiotics for Pneumonia. She was seen again on Sunday. Per the x-ray the pneumonia had cleared up but found that her heart was enlarged and her BP was high. They told her to follow up with her doctor. The last 2 days, she has been having heart palpations and SOB. She also still has a productive cough. Put on a new antibiotic on Sunday. Has the patient traveled out of the country within the last 30 days? ---No Does the patient have any new or worsening symptoms? ---Yes Will a triage be completed? ---Yes Related visit to physician within the last 2 weeks? ---Yes Does the PT have any chronic conditions? (i.e. diabetes, asthma, etc.) ---No Is this a behavioral health or substance abuse call? ---No Guidelines Guideline Title Affirmed Question Affirmed Notes Pneumonia on Antibiotic Post- Hospitalization Follow-up Call [1] MILD difficulty breathing (e.g., minimal/no SOB at rest, SOB with walking, pulse <100) AND [2] worse than when discharged from hospital Final Disposition User See Physician within 24 Hours Ronnald Ramp, RN, Miranda Comments Appt scheduled for 9am with Dr. Diona Browner. Referrals REFERRED TO PCP OFFICE Disagree/Comply: Comply

## 2016-06-29 ENCOUNTER — Encounter: Payer: Self-pay | Admitting: Family Medicine

## 2016-06-29 ENCOUNTER — Ambulatory Visit (INDEPENDENT_AMBULATORY_CARE_PROVIDER_SITE_OTHER): Payer: Self-pay | Admitting: Family Medicine

## 2016-06-29 VITALS — BP 142/90 | HR 80 | Temp 97.9°F | Ht 63.0 in | Wt 155.2 lb

## 2016-06-29 DIAGNOSIS — R0600 Dyspnea, unspecified: Secondary | ICD-10-CM | POA: Insufficient documentation

## 2016-06-29 DIAGNOSIS — R002 Palpitations: Secondary | ICD-10-CM | POA: Insufficient documentation

## 2016-06-29 DIAGNOSIS — H6982 Other specified disorders of Eustachian tube, left ear: Secondary | ICD-10-CM

## 2016-06-29 DIAGNOSIS — H6992 Unspecified Eustachian tube disorder, left ear: Secondary | ICD-10-CM | POA: Insufficient documentation

## 2016-06-29 DIAGNOSIS — I517 Cardiomegaly: Secondary | ICD-10-CM

## 2016-06-29 DIAGNOSIS — J9801 Acute bronchospasm: Secondary | ICD-10-CM | POA: Insufficient documentation

## 2016-06-29 DIAGNOSIS — J189 Pneumonia, unspecified organism: Secondary | ICD-10-CM | POA: Insufficient documentation

## 2016-06-29 MED ORDER — PREDNISONE 10 MG PO TABS: ORAL_TABLET | ORAL | 0 refills | Status: DC

## 2016-06-29 MED ORDER — GUAIFENESIN-CODEINE 100-10 MG/5ML PO SYRP: 5.0000 mL | ORAL_SOLUTION | Freq: Every evening | ORAL | 0 refills | Status: DC | PRN

## 2016-06-29 NOTE — Assessment & Plan Note (Signed)
If not improving with itme can fill rx for prednsione. Stop albuterol given SE.

## 2016-06-29 NOTE — Assessment & Plan Note (Signed)
Likely due to recent PNA and post infectious bronchospasm, but given cardiomegaly on CXR.. Will send for ECHO   Mod Well criteria  For PE: 0

## 2016-06-29 NOTE — Assessment & Plan Note (Signed)
Likely resolved or resolving on antibiotics.  Complete.

## 2016-06-29 NOTE — Assessment & Plan Note (Signed)
And increased BP likely from over use of albuterol.. Stop.

## 2016-06-29 NOTE — Assessment & Plan Note (Signed)
Use flonase.. If not improving can fill prednsione.

## 2016-06-29 NOTE — Patient Instructions (Addendum)
Stop albuterol for now given it may be causing heart racing and BP elevation. Continue Augmentin. Start nasal fluticasone 2 sprays per nostril daily. Stop at front desk for setting up ECHO to evaluate heart. If not improving as expected, can fill rx for prednisone.

## 2016-06-29 NOTE — Progress Notes (Signed)
Subjective:    Patient ID: Jaime Johnson, female    DOB: 01-04-1961, 55 y.o.   MRN: [id number]  HPI    55 year old female pt of Dr. Lillie Fragmin  With recent history of cough.Martin Majestic to [facility] 3 weeks ago, Dx with pneumonia ( Dx with PNA) and shingles.  Given Z-pack. Compliant with this.  Shingles is now gone but pt reports she continued to have cough, copius mucus, so she returned to [facility] 06/24/16 Told pneumonia resolved per CXR.  Also told that heart was enlarged on CXR. Given still with mucus. UC placed her on a new antibitoic ( Augmentin) on 06/25/16.  In the last 3 days she has continued to have coughing fits,  Continued shortness of breath but worse in last 3 days, dizziness as well as palpitations BP running 155-190/ 89-90 at times.  No swelling in ankles.  Modified wells for PE: 0 (3 but-3 given unlikely dx) No cancer, no hx of DVT/PE, no DVT symptoms,no hemoptysis, chest sore with cough in last few days, relative immobilization given illness.  She is currently using  Motrin and friends percocet for left ear pain. Also using albuterol  every 2-3 HOURS!  BP Readings from Last 3 Encounters:  06/29/16 (!) 142/90  06/22/14 (!) 130/100  06/08/14 (!) 145/95    Husband has same symptoms of cough.   Review of Systems  Constitutional: Positive for fatigue. Negative for fever.  HENT: Negative for nosebleeds.   Eyes: Negative for redness.  Respiratory: Positive for cough and shortness of breath. Negative for wheezing.   Cardiovascular: Positive for chest pain.  Gastrointestinal: Negative for abdominal distention.       Objective:   Physical Exam  Constitutional: Vital signs are normal. She appears well-developed and well-nourished. She is cooperative.  Non-toxic appearance. She does not appear ill. No distress.  HENT:  Head: Normocephalic.  Right Ear: Hearing, tympanic membrane, external ear and ear canal normal. Tympanic membrane is not erythematous, not retracted and not bulging.    Left Ear: Hearing, tympanic membrane, external ear and ear canal normal. Tympanic membrane is not erythematous, not retracted and not bulging.  Nose: No mucosal edema or rhinorrhea. Right sinus exhibits no maxillary sinus tenderness and no frontal sinus tenderness. Left sinus exhibits no maxillary sinus tenderness and no frontal sinus tenderness.  Mouth/Throat: Uvula is midline, oropharynx is clear and moist and mucous membranes are normal.  Eyes: Conjunctivae, EOM and lids are normal. Pupils are equal, round, and reactive to light. Lids are everted and swept, no foreign bodies found.  Neck: Trachea normal and normal range of motion. Neck supple. Carotid bruit is not present. No thyroid mass and no thyromegaly present.  Cardiovascular: Normal rate, regular rhythm, S1 normal, S2 normal, normal heart sounds, intact distal pulses and normal pulses.  Exam reveals no gallop and no friction rub.   No murmur heard. Pulmonary/Chest: Effort normal and breath sounds normal. No tachypnea. No respiratory distress. She has no decreased breath sounds. She has no wheezes. She has no rhonchi. She has no rales.  Abdominal: Soft. Normal appearance and bowel sounds are normal. There is no tenderness.  Neurological: She is alert.  Skin: Skin is warm, dry and intact. No rash noted.  Psychiatric: Her speech is normal and behavior is normal. Judgment and thought content normal. Her mood appears not anxious. Cognition and memory are normal. She does not exhibit a depressed mood.          Assessment & Plan:

## 2016-06-29 NOTE — Progress Notes (Signed)
Pre visit review using our clinic review tool, if applicable. No additional management support is needed unless otherwise documented below in the visit note. 

## 2016-07-04 ENCOUNTER — Ambulatory Visit: Payer: Self-pay | Admitting: Family Medicine

## 2016-07-16 ENCOUNTER — Ambulatory Visit (INDEPENDENT_AMBULATORY_CARE_PROVIDER_SITE_OTHER): Payer: Self-pay

## 2016-07-16 ENCOUNTER — Other Ambulatory Visit: Payer: Self-pay

## 2016-07-16 DIAGNOSIS — I517 Cardiomegaly: Secondary | ICD-10-CM

## 2016-07-16 NOTE — Progress Notes (Unsigned)
[place]

## 2016-07-18 ENCOUNTER — Other Ambulatory Visit: Payer: Self-pay | Admitting: Family Medicine

## 2016-07-18 ENCOUNTER — Telehealth: Payer: Self-pay | Admitting: Family Medicine

## 2016-07-18 DIAGNOSIS — K769 Liver disease, unspecified: Secondary | ICD-10-CM

## 2016-07-18 DIAGNOSIS — R931 Abnormal findings on diagnostic imaging of heart and coronary circulation: Secondary | ICD-10-CM

## 2016-07-18 NOTE — Telephone Encounter (Signed)
Left message for patient that I returned her call and will try to reach her again later on today.

## 2016-07-18 NOTE — Telephone Encounter (Signed)
Patient returned Donna's call. °

## 2016-07-18 NOTE — Telephone Encounter (Signed)
See result note from ECHO Cardiogram 07/16/2016.

## 2016-07-19 ENCOUNTER — Telehealth: Payer: Self-pay | Admitting: Family Medicine

## 2016-07-19 NOTE — Telephone Encounter (Signed)
-----   Message from Carter Kitten, [place] sent at 07/18/2016 11:31 AM EDT ----- Jerl Santos notified as instructed by telephone.  She would like to move forward with liver ultrasound.

## 2016-07-20 ENCOUNTER — Telehealth: Payer: Self-pay | Admitting: Family Medicine

## 2016-07-20 NOTE — Telephone Encounter (Signed)
-----   Message from Carter Kitten, [place] sent at 07/18/2016 11:31 AM EDT ----- Jerl Santos notified as instructed by telephone.  She would like to move forward with liver ultrasound.

## 2016-07-20 NOTE — Telephone Encounter (Signed)
Already ordered

## 2016-07-26 ENCOUNTER — Ambulatory Visit
Admission: RE | Admit: 2016-07-26 | Discharge: 2016-07-26 | Disposition: A | Discharge status: Home / Self Care | Payer: Self-pay | Source: Ambulatory Visit | Attending: Family Medicine | Admitting: Family Medicine

## 2016-07-26 DIAGNOSIS — K8020 Calculus of gallbladder without cholecystitis without obstruction: Secondary | ICD-10-CM | POA: Insufficient documentation

## 2016-07-26 DIAGNOSIS — K769 Liver disease, unspecified: Secondary | ICD-10-CM | POA: Insufficient documentation

## 2016-07-26 DIAGNOSIS — R931 Abnormal findings on diagnostic imaging of heart and coronary circulation: Secondary | ICD-10-CM | POA: Insufficient documentation

## 2016-07-27 ENCOUNTER — Telehealth: Payer: Self-pay | Admitting: *Deleted

## 2016-07-27 ENCOUNTER — Other Ambulatory Visit: Payer: Self-pay | Admitting: Family Medicine

## 2016-07-27 DIAGNOSIS — R160 Hepatomegaly, not elsewhere classified: Secondary | ICD-10-CM

## 2016-07-27 DIAGNOSIS — R932 Abnormal findings on diagnostic imaging of liver and biliary tract: Secondary | ICD-10-CM

## 2016-07-27 MED ORDER — DIAZEPAM 5 MG PO TABS: ORAL_TABLET | ORAL | 0 refills | Status: DC

## 2016-07-27 NOTE — Telephone Encounter (Signed)
-----   Message from Owens Loffler, MD sent at 07/27/2016 10:10 AM EDT ----- Spoke with patient - trying to set up scan for [place].   Butch Penny, can you call her in Valium 5 mg, 1 po 30 mins before MRI. #1, 0 ref  Thanks

## 2016-07-27 NOTE — Telephone Encounter (Signed)
Valium called into [facility] in [place] as instructed by Dr. Lorelei Pont.

## 2016-08-08 ENCOUNTER — Ambulatory Visit
Admission: RE | Admit: 2016-08-08 | Discharge: 2016-08-08 | Disposition: A | Discharge status: Home / Self Care | Payer: Self-pay | Source: Ambulatory Visit | Attending: Family Medicine | Admitting: Family Medicine

## 2016-08-08 DIAGNOSIS — K760 Fatty (change of) liver, not elsewhere classified: Secondary | ICD-10-CM | POA: Insufficient documentation

## 2016-08-08 DIAGNOSIS — K769 Liver disease, unspecified: Secondary | ICD-10-CM | POA: Insufficient documentation

## 2016-08-08 DIAGNOSIS — D3502 Benign neoplasm of left adrenal gland: Secondary | ICD-10-CM | POA: Insufficient documentation

## 2016-08-08 DIAGNOSIS — K8020 Calculus of gallbladder without cholecystitis without obstruction: Secondary | ICD-10-CM | POA: Insufficient documentation

## 2016-08-08 DIAGNOSIS — R160 Hepatomegaly, not elsewhere classified: Secondary | ICD-10-CM

## 2016-08-08 DIAGNOSIS — R932 Abnormal findings on diagnostic imaging of liver and biliary tract: Secondary | ICD-10-CM

## 2016-08-08 MED ORDER — GADOBENATE DIMEGLUMINE 529 MG/ML IV SOLN
15.0000 mL | Freq: Once | INTRAVENOUS | Status: AC | PRN
  Administered 2016-08-08: 14 mL via INTRAVENOUS

## 2016-09-20 ENCOUNTER — Other Ambulatory Visit: Payer: Self-pay

## 2016-09-24 ENCOUNTER — Encounter: Payer: Self-pay | Admitting: Family Medicine

## 2016-09-24 ENCOUNTER — Other Ambulatory Visit (INDEPENDENT_AMBULATORY_CARE_PROVIDER_SITE_OTHER): Payer: Self-pay

## 2016-09-24 DIAGNOSIS — E785 Hyperlipidemia, unspecified: Secondary | ICD-10-CM

## 2016-09-24 DIAGNOSIS — R5383 Other fatigue: Secondary | ICD-10-CM

## 2016-09-24 DIAGNOSIS — D649 Anemia, unspecified: Secondary | ICD-10-CM

## 2016-09-24 LAB — BASIC METABOLIC PANEL
BUN: 10 mg/dL (ref 6–23)
CALCIUM: 9.1 mg/dL (ref 8.4–10.5)
CO2: 25 mEq/L (ref 19–32)
Chloride: 106 mEq/L (ref 96–112)
Creatinine, Ser: 0.75 mg/dL (ref 0.40–1.20)
GFR: 84.82 mL/min (ref >60.00)
GLUCOSE: 102 mg/dL — AB (ref 70–99)
POTASSIUM: 4.2 meq/L (ref 3.5–5.1)
SODIUM: 139 meq/L (ref 135–145)

## 2016-09-24 LAB — TSH: TSH: 2.33 u[IU]/mL (ref 0.35–4.50)

## 2016-09-24 LAB — CBC WITH DIFFERENTIAL/PLATELET
BASOS PCT: 1.2 % (ref 0.0–3.0)
Basophils Absolute: 0 10*3/uL (ref 0.0–0.1)
EOS ABS: 0.1 10*3/uL (ref 0.0–0.7)
EOS PCT: 1.5 % (ref 0.0–5.0)
HCT: 29.7 % — ABNORMAL LOW (ref 36.0–46.0)
HEMOGLOBIN: 9.1 g/dL — AB (ref 12.0–15.0)
Lymphocytes Relative: 35.5 % (ref 12.0–46.0)
Lymphs Abs: 1.2 10*3/uL (ref 0.7–4.0)
MCHC: 30.7 g/dL (ref 30.0–36.0)
MCV: 58 fl — ABNORMAL LOW (ref 78.0–100.0)
MONO ABS: 0.3 10*3/uL (ref 0.1–1.0)
Monocytes Relative: 8.1 % (ref 3.0–12.0)
NEUTROS ABS: 1.8 10*3/uL (ref 1.4–7.7)
Neutrophils Relative %: 53.7 % (ref 43.0–77.0)
PLATELETS: 278 10*3/uL (ref 150.0–400.0)
RBC: 5.12 Mil/uL — ABNORMAL HIGH (ref 3.87–5.11)
RDW: 18.4 % — AB (ref 11.5–15.5)
WBC: 3.4 10*3/uL — ABNORMAL LOW (ref 4.0–10.5)

## 2016-09-24 LAB — LIPID PANEL
CHOLESTEROL: 199 mg/dL (ref 0–200)
HDL: 53 mg/dL (ref >39.00)
LDL CALC: 129 mg/dL — AB (ref 0–99)
NonHDL: 145.8
TRIGLYCERIDES: 83 mg/dL (ref 0.0–149.0)
Total CHOL/HDL Ratio: 4
VLDL: 16.6 mg/dL (ref 0.0–40.0)

## 2016-09-24 LAB — FERRITIN: Ferritin: 7.5 ng/mL — ABNORMAL LOW (ref 10.0–291.0)

## 2016-10-01 ENCOUNTER — Encounter: Payer: Self-pay | Admitting: Family Medicine

## 2016-10-01 ENCOUNTER — Ambulatory Visit (INDEPENDENT_AMBULATORY_CARE_PROVIDER_SITE_OTHER): Payer: Self-pay | Admitting: Family Medicine

## 2016-10-01 VITALS — BP 150/84 | HR 80 | Temp 98.2°F | Ht 62.0 in | Wt 158.2 lb

## 2016-10-01 DIAGNOSIS — D62 Acute posthemorrhagic anemia: Secondary | ICD-10-CM

## 2016-10-01 DIAGNOSIS — Z1211 Encounter for screening for malignant neoplasm of colon: Secondary | ICD-10-CM

## 2016-10-01 DIAGNOSIS — N950 Postmenopausal bleeding: Secondary | ICD-10-CM

## 2016-10-01 DIAGNOSIS — Z0000 Encounter for general adult medical examination without abnormal findings: Secondary | ICD-10-CM

## 2016-10-01 DIAGNOSIS — N939 Abnormal uterine and vaginal bleeding, unspecified: Secondary | ICD-10-CM

## 2016-10-01 MED ORDER — MELOXICAM 15 MG PO TABS: 15.0000 mg | ORAL_TABLET | Freq: Every day | ORAL | 5 refills | Status: DC

## 2016-10-01 MED ORDER — TRAMADOL HCL 50 MG PO TABS: 50.0000 mg | ORAL_TABLET | Freq: Four times a day (QID) | ORAL | 2 refills | Status: AC | PRN

## 2016-10-01 NOTE — Patient Instructions (Addendum)
Tart cherry concentrate capsules twice a day    REFERRALS TO SPECIALISTS, SPECIAL TESTS (MRI, CT, ULTRASOUNDS)  [facility] or [facility]DA will help you. ASK CHECK-IN FOR HELP.  Imaging / Special Testing referrals sometimes can be done same day if EMERGENCY, but others can take 2 or 3 days to get an appointment. Starting in 2015, many of the new Medicare plans and Obamacare plans take much longer.   Specialist appointment times vary a great deal, based on their schedule / openings. -- Some specialists have very long wait times. (Example. Dermatology. Multiple months  for non-cancer)     You do not need a referral to make a mammogram appointment, and you may call to make her own mammogram appointment directly around your schedule.  MAMMOGRAPHY IN [place]:  [place] [phone] [place], [place] [address]  [facility] (Formerly [facility]) [address] [place], [place] [address] Phone: [phone] Toll Free: [phone]  MAMMOGRAPHY IN [place]:  [facility] or [place]) [phone] Located on the campus of [facility])  [facility] Location) [place].  [place],  [id number]

## 2016-10-01 NOTE — Progress Notes (Signed)
Dr. Frederico Hamman T. Shaka Zech, MD, [facility] [place], [address] Phone: [phone] Fax: [phone]  10/01/2016  Patient: Jaime Johnson, MRN: [id number], DOB: 12/19/1960, 55 y.o.  Primary Physician:  Owens Loffler, MD   Chief Complaint  Patient presents with  . Annual Exam   Subjective:   Jaime Johnson is a 55 y.o. pleasant patient who presents with the following:  Health Maintenance Summary Reviewed and updated, unless pt declines services.  Tobacco History Reviewed. Non-smoker Alcohol: No concerns, no excessive use Exercise Habits: Some activity, rec at least 30 mins 5 times a week STD concerns: none Drug Use: None Menses regular: irregular Lumps or breast concerns: no Breast Cancer Family History: no  Hemoglobin of 9. The patient also had a low hemoglobin in the 8 range 3 years ago, she was referred to gynecology. She did have a transvaginal ultrasound that was negative at that point, the patient declined endometrial biopsy. She further evaluation and definitive management.  She also has never had a colonoscopy and she is at the age of 18. She denies blood or melena. Per rectum.  She also has excessive hand osteoarthritis and complaints about most joints in her hand as well as some angular changes as well as significant nodular changes.  Health Maintenance  Topic Date Due  . Hepatitis C Screening  18-Mar-1961  . HIV Screening  04/11/1975  . MAMMOGRAM  04/10/2010  . COLONOSCOPY  04/10/2010  . PAP SMEAR  10/07/2015  . TETANUS/TDAP  07/03/2023  . INFLUENZA VACCINE  Completed    Immunization History  Administered Date(s) Administered  . Influenza, Seasonal, Injecte, Preservative Fre 07/24/2016  . Tdap 07/02/2013   Patient Active Problem List   Diagnosis Date Noted  . Abnormal uterine bleeding (AUB) 06/22/2014  . Acute blood loss anemia 05/26/2014  . Allergic rhinitis due to pollen    Past Medical  History:  Diagnosis Date  . Allergic rhinitis due to pollen    Past Surgical History:  Procedure Laterality Date  . CESAREAN SECTION     Social History   Social History  . Marital status: Married    Spouse name: N/A  . Number of children: N/A  . Years of education: N/A   Occupational History  . Not on file.   Social History Main Topics  . Smoking status: Former Research scientist (life sciences)  . Smokeless tobacco: Never Used  . Alcohol use No  . Drug use: No  . Sexual activity: Yes    Partners: Male   Other Topics Concern  . Not on file   Social History Narrative  . No narrative on file   Family History  Problem Relation Age of Onset  . Hepatitis C Mother 34    d/c at 36  . Hepatitis C Brother   . Hypertension Father    No Known Allergies  Medication list has been reviewed and updated.   General: Denies fever, chills, sweats. No significant weight loss. FATIGUE Eyes: Denies blurring,significant itching ENT: Denies earache, sore throat, and hoarseness.  Cardiovascular: Denies chest pains, palpitations, dyspnea on exertion,  Respiratory: Denies cough, dyspnea at rest,wheeezing Breast: no concerns about lumps GI: Denies nausea, vomiting, diarrhea, constipation, change in bowel habits, abdominal pain, melena, hematochezia GU: Denies dysuria, hematuria, urinary hesitancy, nocturia, denies STD risk, no concerns about discharge Musculoskeletal: Denies back pain, DIFFUSE, MULTIPLE JOINT PAINS INCLUDING HANDS GYN - REGULAR BLEEDING Derm: Denies rash, itching Neuro: Denies  paresthesias, frequent  falls, frequent headaches Psych: Denies depression, anxiety Endocrine: Denies cold intolerance, heat intolerance, polydipsia Heme: Denies enlarged lymph nodes Allergy: No hayfever  Objective:   BP (!) 150/84   Pulse 80   Temp 98.2 F (36.8 C) (Oral)   Ht '5\' 2"'$  (1.575 m)   Wt 158 lb 4 oz (71.8 kg)   BMI 28.94 kg/m  No exam data present  GEN: well developed, well nourished, no acute  distress Eyes: conjunctiva and lids normal, PERRLA, EOMI ENT: TM clear, nares clear, oral exam WNL Neck: supple, no lymphadenopathy, no thyromegaly, no JVD Pulm: clear to auscultation and percussion, respiratory effort normal CV: regular rate and rhythm, S1-S2, no murmur, rub or gallop, no bruits Chest: no scars, masses, no lumps BREAST: breast exam declined GI: soft, non-tender; no hepatosplenomegaly, masses; active bowel sounds all quadrants GU: GU exam declined Lymph: no cervical, axillary or inguinal adenopathy MSK: gait normal, muscle tone and strength WNL, no joint swelling, effusions, discoloration, crepitus  SKIN: clear, good turgor, color WNL, no rashes, lesions, or ulcerations Neuro: normal mental status, normal strength, sensation, and motion Psych: alert; oriented to person, place and time, normally interactive and not anxious or depressed in appearance.  All labs reviewed with patient. Lipids:    Component Value Date/Time   CHOL 199 09/24/2016 1122   TRIG 83.0 09/24/2016 1122   HDL 53.00 09/24/2016 1122   LDLDIRECT 128.0 05/26/2014 1004   VLDL 16.6 09/24/2016 1122   CHOLHDL 4 09/24/2016 1122   CBC: CBC Latest Ref Rng & Units 09/24/2016 05/26/2014 10/06/2012  WBC 4.0 - 10.5 K/uL 3.4(L) 6.5 4.0(L)  Hemoglobin 12.0 - 15.0 g/dL 9.1(L) 14.0 8.6 Repeated and verified X2.(L)  Hematocrit 36.0 - 46.0 % 29.7(L) 41.0 27.9(L)  Platelets 150.0 - 400.0 K/uL 278.0 251.0 295.6    Basic Metabolic Panel:    Component Value Date/Time   NA 139 09/24/2016 1122   K 4.2 09/24/2016 1122   CL 106 09/24/2016 1122   CO2 25 09/24/2016 1122   BUN 10 09/24/2016 1122   CREATININE 0.75 09/24/2016 1122   GLUCOSE 102 (H) 09/24/2016 1122   CALCIUM 9.1 09/24/2016 1122   Hepatic Function Latest Ref Rng & Units 05/26/2014 10/06/2012  Total Protein 6.0 - 8.3 g/dL 6.9 7.0  Albumin 3.5 - 5.2 g/dL 3.9 4.2  AST 0 - 37 U/L 25 20  ALT 0 - 35 U/L 26 24  Alk Phosphatase 39 - 117 U/L 35(L) 32(L)  Total  Bilirubin 0.2 - 1.2 mg/dL 0.4 0.4  Bilirubin, Direct 0.0 - 0.3 mg/dL 0.0 0.0    Lab Results  Component Value Date   TSH 2.33 09/24/2016    Assessment and Plan:   Healthcare maintenance  Abnormal uterine bleeding (AUB) - Plan: Ambulatory referral to Gynecology, abnormal uterine bleeding with a hemoglobin of 9 and a postmenopausal patient, age 74. I advised the patient to have this evaluated further, consult gynecology, consider endometrial biopsy given that neoplasm cannot be excluded.  Acute blood loss anemia - Plan: Ambulatory referral to Gastroenterology, Ambulatory referral to Gynecology  Encounter for screening colonoscopy - Plan: Ambulatory referral to Gastroenterology, age 46, never had colonoscopy with a hemoglobin of 9.  Post-menopausal bleeding - Plan: Ambulatory referral to Gynecology  Increase the patient's iron to 325 milligrams by mouth 3 times a day.  Health Maintenance Exam: The patient's preventative maintenance and recommended screening tests for an annual wellness exam were reviewed in full today. Brought up to date unless services declined.  Counselled on  the importance of diet, exercise, and its role in overall health and mortality. The patient's FH and SH was reviewed, including their home life, tobacco status, and drug and alcohol status.  Follow-up in 1 year for physical exam or additional follow-up below.  Follow-up: No Follow-up on file. Or follow-up in 1 year if not noted.  Meds ordered this encounter  Medications  . meloxicam (MOBIC) 15 MG tablet    Sig: Take 1 tablet (15 mg total) by mouth daily.    Dispense:  30 tablet    Refill:  5  . traMADol (ULTRAM) 50 MG tablet    Sig: Take 1 tablet (50 mg total) by mouth every 6 (six) hours as needed.    Dispense:  50 tablet    Refill:  2   Medications Discontinued During This Encounter  Medication Reason  . amoxicillin-clavulanate (AUGMENTIN) 500-125 MG tablet Completed Course  . guaiFENesin-codeine  (ROBITUSSIN AC) 100-10 MG/5ML syrup Completed Course  . predniSONE (DELTASONE) 10 MG tablet Completed Course  . diazepam (VALIUM) 5 MG tablet One time medication  . gabapentin (NEURONTIN) 100 MG capsule No longer needed (for PRN medications)   Orders Placed This Encounter  Procedures  . Ambulatory referral to Gastroenterology  . Ambulatory referral to Gynecology   Patient Instructions  Tart cherry concentrate capsules twice a day    REFERRALS TO SPECIALISTS, SPECIAL TESTS (MRI, CT, ULTRASOUNDS)  [facility] or [facility] will help you. ASK CHECK-IN FOR HELP.  Imaging / Special Testing referrals sometimes can be done same day if EMERGENCY, but others can take 2 or 3 days to get an appointment. Starting in 2015, many of the new Medicare plans and Obamacare plans take much longer.   Specialist appointment times vary a great deal, based on their schedule / openings. -- Some specialists have very long wait times. (Example. Dermatology. Multiple months  for non-cancer)     You do not need a referral to make a mammogram appointment, and you may call to make her own mammogram appointment directly around your schedule.  MAMMOGRAPHY IN [place]:  [place] [phone] [place], [place] [address]  [facility] (Formerly [facility]) [address] [place], [place] [address] Phone: [phone] Toll Free: [phone]  MAMMOGRAPHY IN [place]:  [facility] or [place]) [phone] Located on the campus of [facility])  [facility]) [address]  [place], [place] [address]     Signed,  Frederico Hamman T. Kathlee Barnhardt, MD     Medication List       Accurate as of 10/01/16  5:43 PM. Always use your most recent med list.          meloxicam 15 MG tablet Commonly known as:  MOBIC Take 1 tablet (15 mg total) by mouth daily.   traMADol 50 MG  tablet Commonly known as:  ULTRAM Take 1 tablet (50 mg total) by mouth every 6 (six) hours as needed.

## 2016-10-01 NOTE — Progress Notes (Signed)
Pre visit review using our clinic review tool, if applicable. No additional management support is needed unless otherwise documented below in the visit note. 

## 2016-11-02 ENCOUNTER — Other Ambulatory Visit: Payer: Self-pay

## 2016-11-02 ENCOUNTER — Telehealth: Payer: Self-pay

## 2016-11-02 NOTE — Telephone Encounter (Signed)
Gastroenterology Pre-Procedure Review  Request Date:  Requesting Physician: Dr.   PATIENT REVIEW QUESTIONS: The patient responded to the following health history questions as indicated:    1. Are you having any GI issues? no 2. Do you have a personal history of Polyps? no 3. Do you have a family history of Colon Cancer or Polyps? no 4. Diabetes Mellitus? no 5. Joint replacements in the past 12 months?no 6. Major health problems in the past 3 months?no 7. Any artificial heart valves, MVP, or defibrillator?no    MEDICATIONS & ALLERGIES:    Patient reports the following regarding taking any anticoagulation/antiplatelet therapy:   Plavix, Coumadin, Eliquis, Xarelto, Lovenox, Pradaxa, Brilinta, or Effient? no Aspirin? no  Patient confirms/reports the following medications:  Current Outpatient Prescriptions  Medication Sig Dispense Refill  . meloxicam (MOBIC) 15 MG tablet Take 1 tablet (15 mg total) by mouth daily. 30 tablet 5   No current facility-administered medications for this visit.     Patient confirms/reports the following allergies:  No Known Allergies  No orders of the defined types were placed in this encounter.   AUTHORIZATION INFORMATION Primary Insurance: 1D#: Group #:  Secondary Insurance: 1D#: Group #:  SCHEDULE INFORMATION: Date: 11/12/16 Time: Location: [place]

## 2016-11-05 ENCOUNTER — Encounter: Payer: Self-pay | Admitting: *Deleted

## 2016-11-09 NOTE — Discharge Instructions (Signed)

## 2016-11-12 ENCOUNTER — Ambulatory Visit
Admission: RE | Admit: 2016-11-12 | Discharge: 2016-11-12 | Disposition: A | Discharge status: Home / Self Care | Payer: Self-pay | Source: Ambulatory Visit | Attending: Gastroenterology | Admitting: Gastroenterology

## 2016-11-12 ENCOUNTER — Ambulatory Visit: Payer: Self-pay | Admitting: Anesthesiology

## 2016-11-12 ENCOUNTER — Encounter
Admission: RE | Disposition: A | Discharge status: Home / Self Care | Payer: Self-pay | Source: Ambulatory Visit | Attending: Gastroenterology

## 2016-11-12 DIAGNOSIS — K621 Rectal polyp: Secondary | ICD-10-CM

## 2016-11-12 DIAGNOSIS — K635 Polyp of colon: Secondary | ICD-10-CM

## 2016-11-12 DIAGNOSIS — D128 Benign neoplasm of rectum: Secondary | ICD-10-CM | POA: Insufficient documentation

## 2016-11-12 DIAGNOSIS — Z87891 Personal history of nicotine dependence: Secondary | ICD-10-CM | POA: Insufficient documentation

## 2016-11-12 DIAGNOSIS — D125 Benign neoplasm of sigmoid colon: Secondary | ICD-10-CM

## 2016-11-12 DIAGNOSIS — Z79899 Other long term (current) drug therapy: Secondary | ICD-10-CM | POA: Insufficient documentation

## 2016-11-12 DIAGNOSIS — Z1211 Encounter for screening for malignant neoplasm of colon: Secondary | ICD-10-CM

## 2016-11-12 DIAGNOSIS — Z791 Long term (current) use of non-steroidal anti-inflammatories (NSAID): Secondary | ICD-10-CM | POA: Insufficient documentation

## 2016-11-12 DIAGNOSIS — D649 Anemia, unspecified: Secondary | ICD-10-CM | POA: Insufficient documentation

## 2016-11-12 HISTORY — PX: POLYPECTOMY: SHX5525

## 2016-11-12 HISTORY — PX: COLONOSCOPY WITH PROPOFOL: SHX5780

## 2016-11-12 HISTORY — DX: Unspecified osteoarthritis, unspecified site: M19.90

## 2016-11-12 HISTORY — DX: Anemia, unspecified: D64.9

## 2016-11-12 HISTORY — DX: Motion sickness, initial encounter: T75.3XXA

## 2016-11-12 SURGERY — COLONOSCOPY WITH PROPOFOL
Anesthesia: Monitor Anesthesia Care | Wound class: Contaminated

## 2016-11-12 MED ORDER — STERILE WATER FOR IRRIGATION IR SOLN
Status: DC | PRN
  Administered 2016-11-12: 09:00:00

## 2016-11-12 MED ORDER — PROPOFOL 10 MG/ML IV BOLUS
INTRAVENOUS | Status: DC | PRN
  Administered 2016-11-12 (×3): 50 mg via INTRAVENOUS
  Administered 2016-11-12: 100 mg via INTRAVENOUS
  Administered 2016-11-12 (×4): 50 mg via INTRAVENOUS

## 2016-11-12 MED ORDER — LIDOCAINE HCL (CARDIAC) 20 MG/ML IV SOLN
INTRAVENOUS | Status: DC | PRN
  Administered 2016-11-12: 40 mg via INTRAVENOUS

## 2016-11-12 MED ORDER — SODIUM CHLORIDE 0.9 % IJ SOLN
INTRAMUSCULAR | Status: DC | PRN
  Administered 2016-11-12: 15 mL

## 2016-11-12 MED ORDER — LACTATED RINGERS IV SOLN
INTRAVENOUS | Status: DC
  Administered 2016-11-12: 08:00:00 via INTRAVENOUS

## 2016-11-12 SURGICAL SUPPLY — 23 items
CANISTER SUCT 1200ML W/VALVE (MISCELLANEOUS) ×4 IMPLANT
CLIP HMST 235XBRD CATH ROT (MISCELLANEOUS) ×6 IMPLANT
CLIP RESOLUTION 360 11X235 (MISCELLANEOUS) ×6
FCP ESCP3.2XJMB 240X2.8X (MISCELLANEOUS)
FORCEPS BIOP RAD 4 LRG CAP 4 (CUTTING FORCEPS) ×4 IMPLANT
FORCEPS BIOP RJ4 240 W/NDL (MISCELLANEOUS)
FORCEPS ESCP3.2XJMB 240X2.8X (MISCELLANEOUS) IMPLANT
GOWN CVR UNV OPN BCK APRN NK (MISCELLANEOUS) ×4 IMPLANT
GOWN ISOL THUMB LOOP REG UNIV (MISCELLANEOUS) ×4
INJECTOR VARIJECT VIN23 (MISCELLANEOUS) ×2 IMPLANT
KIT DEFENDO VALVE AND CONN (KITS) IMPLANT
KIT ENDO PROCEDURE OLY (KITS) ×4 IMPLANT
MARKER SPOT ENDO TATTOO 5ML (MISCELLANEOUS) IMPLANT
PAD GROUND ADULT SPLIT (MISCELLANEOUS) ×4 IMPLANT
PROBE APC STR FIRE (PROBE) IMPLANT
RETRIEVER NET ROTH 2.5X230 LF (MISCELLANEOUS) IMPLANT
SNARE SHORT THROW 13M SML OVAL (MISCELLANEOUS) ×4 IMPLANT
SNARE SHORT THROW 30M LRG OVAL (MISCELLANEOUS) IMPLANT
SNARE SNG USE RND 15MM (INSTRUMENTS) IMPLANT
SPOT EX ENDOSCOPIC TATTOO (MISCELLANEOUS)
TRAP ETRAP POLY (MISCELLANEOUS) ×4 IMPLANT
VARIJECT INJECTOR VIN23 (MISCELLANEOUS) ×4
WATER STERILE IRR 250ML POUR (IV SOLUTION) ×4 IMPLANT

## 2016-11-12 NOTE — Anesthesia Preprocedure Evaluation (Signed)
Anesthesia Evaluation  Patient identified by MRN, date of birth, ID band Patient awake    Reviewed: Allergy & Precautions, H&P , NPO status , Patient's Chart, lab work & pertinent test results, reviewed documented beta blocker date and time   Airway Mallampati: II  TM Distance: >3 FB Neck ROM: full    Dental no notable dental hx.    Pulmonary neg pulmonary ROS, former smoker,    Pulmonary exam normal breath sounds clear to auscultation       Cardiovascular Exercise Tolerance: Good negative cardio ROS   Rhythm:regular Rate:Normal     Neuro/Psych negative neurological ROS  negative psych ROS   GI/Hepatic negative GI ROS, Neg liver ROS,   Endo/Other  negative endocrine ROS  Renal/GU negative Renal ROS  negative genitourinary   Musculoskeletal  (+) Arthritis ,   Abdominal   Peds  Hematology negative hematology ROS (+) anemia ,   Anesthesia Other Findings   Reproductive/Obstetrics negative OB ROS                             Anesthesia Physical Anesthesia Plan  ASA: I  Anesthesia Plan: MAC   Post-op Pain Management:    Induction:   Airway Management Planned:   Additional Equipment:   Intra-op Plan:   Post-operative Plan:   Informed Consent: I have reviewed the patients History and Physical, chart, labs and discussed the procedure including the risks, benefits and alternatives for the proposed anesthesia with the patient or authorized representative who has indicated his/her understanding and acceptance.   Dental Advisory Given  Plan Discussed with: CRNA  Anesthesia Plan Comments:         Anesthesia Quick Evaluation

## 2016-11-12 NOTE — Transfer of Care (Signed)
Immediate Anesthesia Transfer of Care Note  Patient: Jaime Johnson  Procedure(s) Performed: Procedure(s): COLONOSCOPY WITH PROPOFOL (N/A) POLYPECTOMY  Patient Location: PACU  Anesthesia Type: MAC  Level of Consciousness: awake, alert  and patient cooperative  Airway and Oxygen Therapy: Patient Spontanous Breathing and Patient connected to supplemental oxygen  Post-op Assessment: Post-op Vital signs reviewed, Patient's Cardiovascular Status Stable, Respiratory Function Stable, Patent Airway and No signs of Nausea or vomiting  Post-op Vital Signs: Reviewed and stable  Complications: No apparent anesthesia complications

## 2016-11-12 NOTE — Anesthesia Postprocedure Evaluation (Signed)
Anesthesia Post Note  Patient: Jaime Johnson  Procedure(s) Performed: Procedure(s) (LRB): COLONOSCOPY WITH PROPOFOL (N/A) POLYPECTOMY  Patient location during evaluation: PACU Anesthesia Type: MAC Level of consciousness: awake and alert Pain management: pain level controlled Vital Signs Assessment: post-procedure vital signs reviewed and stable Respiratory status: spontaneous breathing, nonlabored ventilation, respiratory function stable and patient connected to nasal cannula oxygen Cardiovascular status: stable and blood pressure returned to baseline Anesthetic complications: no    Alisa Graff

## 2016-11-12 NOTE — Op Note (Signed)
[facility] Gastroenterology Patient Name: Jaime Johnson Procedure Date: 11/12/2016 9:01 AM MRN: [id number] Account #: [id number] Date of Birth: April 15, 1961 Admit Type: Outpatient Age: 56 Room: [facility] OR ROOM 01 Gender: Female Note Status: Finalized Procedure:            Colonoscopy Indications:          Screening for colorectal malignant neoplasm Providers:            Lucilla Lame MD, MD Referring MD:         Maud Deed. Copland MD, MD (Referring MD) Medicines:            Propofol per Anesthesia Complications:        No immediate complications. Procedure:            Pre-Anesthesia Assessment:                       - Prior to the procedure, a History and Physical was                        performed, and patient medications and allergies were                        reviewed. The patient's tolerance of previous                        anesthesia was also reviewed. The risks and benefits of                        the procedure and the sedation options and risks were                        discussed with the patient. All questions were                        answered, and informed consent was obtained. Prior                        Anticoagulants: The patient has taken no previous                        anticoagulant or antiplatelet agents. ASA Grade                        Assessment: II - A patient with mild systemic disease.                        After reviewing the risks and benefits, the patient was                        deemed in satisfactory condition to undergo the                        procedure.                       After obtaining informed consent, the colonoscope was                        passed under direct vision. Throughout the procedure,  the patient's blood pressure, pulse, and oxygen                        saturations were monitored continuously. The was                        introduced through the anus and advanced to the the                       cecum, identified by appendiceal orifice and ileocecal                        valve. The colonoscopy was performed without                        difficulty. The patient tolerated the procedure well.                        The quality of the bowel preparation was excellent. Findings:      The perianal and digital rectal examinations were normal.      Two sessile polyps were found in the sigmoid colon. The polyps were 3 to       4 mm in size. These polyps were removed with a cold biopsy forceps.       Resection and retrieval were complete.      A 20 mm polyp was found in the rectum. The polyp was sessile. Area was       successfully injected with 15 mL saline for a lift polypectomy. The       polyp was removed with a hot snare. Resection and retrieval were       complete. To prevent bleeding post-intervention, three hemostatic clips       were successfully placed (MR conditional). There was no bleeding at the       end of the procedure. Impression:           - Two 3 to 4 mm polyps in the sigmoid colon, removed                        with a cold biopsy forceps. Resected and retrieved.                       - One 20 mm polyp in the rectum, removed with a hot                        snare. Resected and retrieved. Injected. Clips (MR                        conditional) were placed. Recommendation:       - Discharge patient to home.                       - Resume previous diet.                       - Continue present medications.                       - Await pathology results.                       -  Repeat colonoscopy in 3 months to check healing. Procedure Code(s):    --- Professional ---                       [phone], Colonoscopy, flexible; with removal of tumor(s),                        polyp(s), or other lesion(s) by snare technique                       45380, 30, Colonoscopy, flexible; with biopsy, single                        or multiple                        45381, Colonoscopy, flexible; with directed submucosal                        injection(s), any substance Diagnosis Code(s):    --- Professional ---                       Z12.11, Encounter for screening for malignant neoplasm                        of colon                       D12.5, Benign neoplasm of sigmoid colon                       K62.1, Rectal polyp CPT copyright 2016 American Medical Association. All rights reserved. The codes documented in this report are preliminary and upon coder review may  be revised to meet current compliance requirements. Lucilla Lame MD, MD 11/12/2016 9:39:52 AM This report has been signed electronically. Number of Addenda: 0 Note Initiated On: 11/12/2016 9:01 AM Scope Withdrawal Time: 0 hours 20 minutes 31 seconds  Total Procedure Duration: 0 hours 24 minutes 49 seconds       [facility]

## 2016-11-12 NOTE — Anesthesia Procedure Notes (Signed)
Procedure Name: MAC Date/Time: 11/12/2016 9:06 AM Performed by: Janna Arch Pre-anesthesia Checklist: Patient identified, Emergency Drugs available, Patient being monitored and Suction available Patient Re-evaluated:Patient Re-evaluated prior to inductionOxygen Delivery Method: Nasal cannula

## 2016-11-12 NOTE — H&P (Signed)
  Jaime Lame, MD [address] [place], [place] [address] Phone: [phone] Fax : [phone]  Primary Care Physician:  Owens Loffler, MD Primary Gastroenterologist:  Dr. Allen Norris  Pre-Procedure History & Physical: HPI:  Jaime Johnson is a 56 y.o. female is here for a screening colonoscopy.   Past Medical History:  Diagnosis Date  . Allergic rhinitis due to pollen   . Anemia   . Arthritis    Hands, knees, shoulder  . Motion sickness    cars    Past Surgical History:  Procedure Laterality Date  . CESAREAN SECTION      Prior to Admission medications   Medication Sig Start Date End Date Taking? Authorizing Provider  IRON PO Take by mouth.   Yes Historical Provider, MD  meloxicam (MOBIC) 15 MG tablet Take 1 tablet (15 mg total) by mouth daily. 10/01/16  Yes Spencer Copland, MD  traMADol (ULTRAM) 50 MG tablet Take by mouth every 6 (six) hours as needed.   Yes Historical Provider, MD    Allergies as of 11/02/2016  . (No Known Allergies)    Family History  Problem Relation Age of Onset  . Hepatitis C Mother 70    d/c at 56  . Hepatitis C Brother   . Hypertension Father     Social History   Social History  . Marital status: Married    Spouse name: N/A  . Number of children: N/A  . Years of education: N/A   Occupational History  . Not on file.   Social History Main Topics  . Smoking status: Former Smoker    Quit date: 10/22/1978  . Smokeless tobacco: Never Used  . Alcohol use No  . Drug use: No  . Sexual activity: Yes    Partners: Male   Other Topics Concern  . Not on file   Social History Narrative  . No narrative on file    Review of Systems: See HPI, otherwise negative ROS  Physical Exam: BP (!) 160/79   Pulse 65   Temp 97.1 F (36.2 C) (Temporal)   Resp 16   Ht 5\' 2"  (1.575 m)   Wt 154 lb (69.9 kg)   LMP 11/01/2016 (Exact Date) Comment: preg test neg  SpO2 100%   BMI 28.17 kg/m  General:   Alert,  pleasant and  cooperative in NAD Head:  Normocephalic and atraumatic. Neck:  Supple; no masses or thyromegaly. Lungs:  Clear throughout to auscultation.    Heart:  Regular rate and rhythm. Abdomen:  Soft, nontender and nondistended. Normal bowel sounds, without guarding, and without rebound.   Neurologic:  Alert and  oriented x4;  grossly normal neurologically.  Impression/Plan: Jaime Johnson is now here to undergo a screening colonoscopy.  Risks, benefits, and alternatives regarding colonoscopy have been reviewed with the patient.  Questions have been answered.  All parties agreeable.

## 2016-11-13 ENCOUNTER — Encounter: Payer: Self-pay | Admitting: Gastroenterology

## 2016-11-16 ENCOUNTER — Telehealth: Payer: Self-pay

## 2016-11-16 NOTE — Telephone Encounter (Signed)
-----   Message from Lucilla Lame, MD sent at 11/14/2016 10:45 AM EST ----- Please have the patient come in for a follow up.

## 2016-11-16 NOTE — Telephone Encounter (Signed)
LVM for pt to return my call to schedule follow up appt to discuss colonoscopy results.  

## 2016-11-19 NOTE — Telephone Encounter (Signed)
Pt scheduled for a follow up appt with Allen Norris on Feb 1st.

## 2016-11-22 ENCOUNTER — Ambulatory Visit: Payer: Self-pay | Admitting: Gastroenterology

## 2016-11-26 ENCOUNTER — Ambulatory Visit (INDEPENDENT_AMBULATORY_CARE_PROVIDER_SITE_OTHER): Payer: Self-pay | Admitting: Gastroenterology

## 2016-11-26 ENCOUNTER — Other Ambulatory Visit: Payer: Self-pay

## 2016-11-26 ENCOUNTER — Encounter: Payer: Self-pay | Admitting: Gastroenterology

## 2016-11-26 VITALS — BP 175/77 | HR 65 | Temp 98.0°F | Ht 62.0 in | Wt 154.0 lb

## 2016-11-26 DIAGNOSIS — D509 Iron deficiency anemia, unspecified: Secondary | ICD-10-CM

## 2016-11-26 DIAGNOSIS — K621 Rectal polyp: Secondary | ICD-10-CM

## 2016-11-26 NOTE — Progress Notes (Signed)
   Primary Care Physician: Owens Loffler, MD  Primary Gastroenterologist:  Dr. Lucilla Lame  Chief Complaint  Patient presents with  . Follow up colonoscopy results    HPI: Jaime Johnson is a 56 y.o. female here follow-up after her colonoscopy.  The patient had a colonoscopy with a large rectal polyp found.  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  There is no adenocarcinoma seen. The patient also reports that she has been told she has iron deficiency anemia.  Current Outpatient Prescriptions  Medication Sig Dispense Refill  . IRON PO Take by mouth.    . meloxicam (MOBIC) 15 MG tablet Take 1 tablet (15 mg total) by mouth daily. 30 tablet 5  . traMADol (ULTRAM) 50 MG tablet Take by mouth every 6 (six) hours as needed.     No current facility-administered medications for this visit.     Allergies as of 11/26/2016  . (No Known Allergies)    ROS:  General: Negative for anorexia, weight loss, fever, chills, fatigue, weakness. ENT: Negative for hoarseness, difficulty swallowing , nasal congestion. CV: Negative for chest pain, angina, palpitations, dyspnea on exertion, peripheral edema.  Respiratory: Negative for dyspnea at rest, dyspnea on exertion, cough, sputum, wheezing.  GI: See history of present illness. GU:  Negative for dysuria, hematuria, urinary incontinence, urinary frequency, nocturnal urination.  Endo: Negative for unusual weight change.    Physical Examination:   BP (!) 175/77   Pulse 65   Temp 98 F (36.7 C) (Oral)   Ht 5\' 2"  (1.575 m)   Wt 154 lb (69.9 kg)   LMP 11/01/2016 (Exact Date) Comment: preg test neg  BMI 28.17 kg/m   General: Well-nourished, well-developed in no acute distress.  Eyes: No icterus. Conjunctivae pink. Extremities: No lower extremity edema. No clubbing or deformities. Neuro: Alert and oriented x 3.  Grossly intact. Skin: Warm and dry, no jaundice.   Psych: Alert and cooperative, normal mood and  affect.  Labs:    Imaging Studies: No results found.  Assessment and Plan:   Jaime Johnson is a 56 y.o. 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  The patient will be set up for a repeat colonoscopy in 3 months.  She will also let me know if her anemia persists at which at that time may require her to undergo a upper endoscopy at the same time. The patient has been explaining the results and the plan and she agrees with it.    Lucilla Lame, MD. Marval Regal   Note: This dictation was prepared with Dragon dictation along with smaller phrase technology. Any transcriptional errors that result from this process are unintentional.

## 2016-11-26 NOTE — Patient Instructions (Signed)
You have been scheduled for a repeat colonoscopy at [facility] on Monday, May 14th. You will receive a preop call from the nurse 1 week prior.   If you have any questions or concerns prior to procedure, please give me a call.

## 2016-12-10 ENCOUNTER — Other Ambulatory Visit: Payer: Self-pay | Admitting: Family Medicine

## 2016-12-10 DIAGNOSIS — Z1231 Encounter for screening mammogram for malignant neoplasm of breast: Secondary | ICD-10-CM

## 2016-12-14 ENCOUNTER — Telehealth: Payer: Self-pay

## 2016-12-14 DIAGNOSIS — D619 Aplastic anemia, unspecified: Secondary | ICD-10-CM

## 2016-12-14 NOTE — Telephone Encounter (Signed)
Pt left v/m; pt has appt to be seen on 12/19/16 at 9AM with Dr Lorelei Pont to discuss iron; pt request to have iron lab test prior to appt so can discuss results at appt. Pt request cb.

## 2016-12-15 NOTE — Telephone Encounter (Signed)
  CBC with diff, ferritin, IBC panel: iron def anemia

## 2016-12-17 ENCOUNTER — Other Ambulatory Visit (INDEPENDENT_AMBULATORY_CARE_PROVIDER_SITE_OTHER): Payer: Self-pay

## 2016-12-17 DIAGNOSIS — D619 Aplastic anemia, unspecified: Secondary | ICD-10-CM

## 2016-12-17 LAB — CBC WITH DIFFERENTIAL/PLATELET
BASOS PCT: 1.7 % (ref 0.0–3.0)
Basophils Absolute: 0.1 10*3/uL (ref 0.0–0.1)
EOS PCT: 1.2 % (ref 0.0–5.0)
Eosinophils Absolute: 0.1 10*3/uL (ref 0.0–0.7)
HCT: 41.7 % (ref 36.0–46.0)
HEMOGLOBIN: 13.6 g/dL (ref 12.0–15.0)
LYMPHS ABS: 1.5 10*3/uL (ref 0.7–4.0)
Lymphocytes Relative: 31.3 % (ref 12.0–46.0)
MCHC: 32.5 g/dL (ref 30.0–36.0)
MCV: 74.2 fl — ABNORMAL LOW (ref 78.0–100.0)
MONO ABS: 0.3 10*3/uL (ref 0.1–1.0)
Monocytes Relative: 6.6 % (ref 3.0–12.0)
NEUTROS ABS: 2.9 10*3/uL (ref 1.4–7.7)
NEUTROS PCT: 59.2 % (ref 43.0–77.0)
PLATELETS: 286 10*3/uL (ref 150.0–400.0)
RBC: 5.62 Mil/uL — ABNORMAL HIGH (ref 3.87–5.11)
RDW: 22.2 % — AB (ref 11.5–15.5)
WBC: 4.9 10*3/uL (ref 4.0–10.5)

## 2016-12-17 LAB — FERRITIN: FERRITIN: 15.5 ng/mL (ref 10.0–291.0)

## 2016-12-17 LAB — IBC PANEL
Iron: 48 ug/dL (ref 42–145)
Saturation Ratios: 8.9 % — ABNORMAL LOW (ref 20.0–50.0)
Transferrin: 386 mg/dL — ABNORMAL HIGH (ref 212.0–360.0)

## 2016-12-17 NOTE — Telephone Encounter (Signed)
Orders entered

## 2016-12-18 ENCOUNTER — Ambulatory Visit: Admission: RE | Admit: 2016-12-18 | Payer: Self-pay | Source: Ambulatory Visit

## 2016-12-19 ENCOUNTER — Ambulatory Visit (INDEPENDENT_AMBULATORY_CARE_PROVIDER_SITE_OTHER): Payer: Self-pay | Admitting: Family Medicine

## 2016-12-19 ENCOUNTER — Encounter: Payer: Self-pay | Admitting: Family Medicine

## 2016-12-19 VITALS — BP 170/94 | HR 69 | Temp 97.4°F | Ht 62.0 in | Wt 154.5 lb

## 2016-12-19 DIAGNOSIS — I10 Essential (primary) hypertension: Secondary | ICD-10-CM | POA: Insufficient documentation

## 2016-12-19 DIAGNOSIS — D62 Acute posthemorrhagic anemia: Secondary | ICD-10-CM

## 2016-12-19 HISTORY — DX: Essential (primary) hypertension: I10

## 2016-12-19 MED ORDER — LISINOPRIL-HYDROCHLOROTHIAZIDE 10-12.5 MG PO TABS: 1.0000 | ORAL_TABLET | Freq: Every day | ORAL | 3 refills | Status: DC

## 2016-12-19 MED ORDER — TIZANIDINE HCL 4 MG PO TABS: 4.0000 mg | ORAL_TABLET | Freq: Every evening | ORAL | 2 refills | Status: AC

## 2016-12-19 NOTE — Progress Notes (Signed)
Pre visit review using our clinic review tool, if applicable. No additional management support is needed unless otherwise documented below in the visit note. 

## 2016-12-19 NOTE — Progress Notes (Signed)
Dr. Frederico Hamman T. Seanmichael Salmons, MD, [facility] [place], [address] Phone: [phone] Fax: [phone]  12/19/2016  Patient: Jaime Johnson, MRN: [id number], DOB: Jun 30, 1961, 56 y.o.  Primary Physician:  Owens Loffler, MD   Chief Complaint  Patient presents with  . Follow-up    Labs/Iron   Subjective:   Jaime Johnson is a 56 y.o. very pleasant female patient who presents with the following:  F/u iron def anemia.  F/u high-grade colonic dysplasia.   Results for orders placed or performed in visit on 12/17/16  CBC with Differential/Platelet  Result Value Ref Range   WBC 4.9 4.0 - 10.5 K/uL   RBC 5.62 (H) 3.87 - 5.11 Mil/uL   Hemoglobin 13.6 12.0 - 15.0 g/dL   HCT 41.7 36.0 - 46.0 %   MCV 74.2 (L) 78.0 - 100.0 fl   MCHC 32.5 30.0 - 36.0 g/dL   RDW 22.2 (H) 11.5 - 15.5 %   Platelets 286.0 150.0 - 400.0 K/uL   Neutrophils Relative % 59.2 43.0 - 77.0 %   Lymphocytes Relative 31.3 12.0 - 46.0 %   Monocytes Relative 6.6 3.0 - 12.0 %   Eosinophils Relative 1.2 0.0 - 5.0 %   Basophils Relative 1.7 0.0 - 3.0 %   Neutro Abs 2.9 1.4 - 7.7 K/uL   Lymphs Abs 1.5 0.7 - 4.0 K/uL   Monocytes Absolute 0.3 0.1 - 1.0 K/uL   Eosinophils Absolute 0.1 0.0 - 0.7 K/uL   Basophils Absolute 0.1 0.0 - 0.1 K/uL  Ferritin  Result Value Ref Range   Ferritin 15.5 10.0 - 291.0 ng/mL  IBC panel  Result Value Ref Range   Iron 48 42 - 145 ug/dL   Transferrin 386.0 (H) 212.0 - 360.0 mg/dL   Saturation Ratios 8.9 (L) 20.0 - 50.0 %     BP: 170/94 No BP meds right now Some HA and head fullness.  BP Readings from Last 3 Encounters:  12/19/16 120/80  11/26/16 (!) 175/77  11/12/16 139/71    4 readings today are elevated.    Past Medical History, Surgical History, Social History, Family History, Problem List, Medications, and Allergies have been reviewed and updated if relevant.  Patient Active Problem List   Diagnosis Date Noted    . Special screening for malignant neoplasms, colon   . Polyp of sigmoid colon   . Rectal polyp   . Abnormal uterine bleeding (AUB) 06/22/2014  . Acute blood loss anemia 05/26/2014  . Allergic rhinitis due to pollen     Past Medical History:  Diagnosis Date  . Allergic rhinitis due to pollen   . Anemia   . Arthritis    Hands, knees, shoulder  . Motion sickness    cars    Past Surgical History:  Procedure Laterality Date  . CESAREAN SECTION    . COLONOSCOPY WITH PROPOFOL N/A 11/12/2016   Procedure: COLONOSCOPY WITH PROPOFOL;  Surgeon: Lucilla Lame, MD;  Location: [place];  Service: Endoscopy;  Laterality: N/A;  . POLYPECTOMY  11/12/2016   Procedure: POLYPECTOMY;  Surgeon: Lucilla Lame, MD;  Location: [place];  Service: Endoscopy;;    Social History   Social History  . Marital status: Married    Spouse name: N/A  . Number of children: N/A  . Years of education: N/A   Occupational History  . Not on file.   Social History Main Topics  . Smoking status: Former Smoker  Quit date: 10/22/1978  . Smokeless tobacco: Never Used  . Alcohol use No  . Drug use: No  . Sexual activity: Yes    Partners: Male   Other Topics Concern  . Not on file   Social History Narrative  . No narrative on file    Family History  Problem Relation Age of Onset  . Hepatitis C Mother 12    d/c at 15  . Hepatitis C Brother   . Hypertension Father     No Known Allergies  Medication list reviewed and updated in full in [place].   GEN: No acute illnesses, no fevers, chills. GI: No n/v/d, eating normally Pulm: No SOB Interactive and getting along well at home.  Otherwise, ROS is as per the HPI.  Objective:   Vitals:   12/19/16 0903 12/19/16 1336  BP: 120/80 (!) 170/94  Pulse: 69   Temp: 97.4 F (36.3 C)   TempSrc: Oral   Weight: 154 lb 8 oz (70.1 kg)   Height: 5\' 2"  (1.575 m)     GEN: WDWN, NAD, Non-toxic, A & O x 3 HEENT: Atraumatic,  Normocephalic. Neck supple. No masses, No LAD. Ears and Nose: No external deformity. CV: RRR, No M/G/R. No JVD. No thrill. No extra heart sounds. PULM: CTA B, no wheezes, crackles, rhonchi. No retractions. No resp. distress. No accessory muscle use. EXTR: No c/c/e NEURO Normal gait.  PSYCH: Normally interactive. Conversant. Not depressed or anxious appearing.  Calm demeanor.   Laboratory and Imaging Data:  Assessment and Plan:   Acute blood loss anemia  Essential hypertension  Hgb normal - cont iron  New onset htn - poor control  Follow-up: Return in about 1 month (around 01/16/2017).  Meds ordered this encounter  Medications  . tiZANidine (ZANAFLEX) 4 MG tablet    Sig: Take 1 tablet (4 mg total) by mouth Nightly.    Dispense:  30 tablet    Refill:  2  . lisinopril-hydrochlorothiazide (PRINZIDE,ZESTORETIC) 10-12.5 MG tablet    Sig: Take 1 tablet by mouth daily.    Dispense:  90 tablet    Refill:  3   Medications Discontinued During This Encounter  Medication Reason  . traMADol (ULTRAM) 50 MG tablet     Signed,  Bernice Mcauliffe T. Javonn Gauger, MD   Allergies as of 12/19/2016   No Known Allergies     Medication List       Accurate as of 12/19/16  1:37 PM. Always use your most recent med list.          IRON PO Take by mouth.   lisinopril-hydrochlorothiazide 10-12.5 MG tablet Commonly known as:  PRINZIDE,ZESTORETIC Take 1 tablet by mouth daily.   meloxicam 15 MG tablet Commonly known as:  MOBIC Take 1 tablet (15 mg total) by mouth daily.   tiZANidine 4 MG tablet Commonly known as:  ZANAFLEX Take 1 tablet (4 mg total) by mouth Nightly.

## 2017-01-16 ENCOUNTER — Ambulatory Visit: Payer: Self-pay | Attending: Oncology

## 2017-01-16 ENCOUNTER — Ambulatory Visit
Admission: RE | Admit: 2017-01-16 | Discharge: 2017-01-16 | Disposition: A | Discharge status: Home / Self Care | Payer: Self-pay | Source: Ambulatory Visit | Attending: Oncology | Admitting: Oncology

## 2017-01-16 VITALS — BP 170/88 | HR 75 | Temp 94.3°F | Resp 18 | Ht 62.0 in | Wt 155.0 lb

## 2017-01-16 DIAGNOSIS — Z0000 Encounter for general adult medical examination without abnormal findings: Secondary | ICD-10-CM

## 2017-01-16 NOTE — Progress Notes (Signed)
Subjective:     Patient ID: Jaime Johnson, female   DOB: 04-03-1961, 56 y.o.   MRN: [id number]  HPI   Review of Systems     Objective:   Physical Exam  Pulmonary/Chest: Right breast exhibits no inverted nipple, no mass, no nipple discharge, no skin change and no tenderness. Left breast exhibits no inverted nipple, no mass, no nipple discharge, no skin change and no tenderness. Breasts are symmetrical.       Assessment:     56 year old [place] patient presents for [facility] clinic visit.  Patient fluent in [place]. Patient screened, and meets B[facility]CP eligibility.  Patient does not have insurance, Medicare or Medicaid.  Handout given on Affordable Care Act.  Instructed patient on breast self-exam using teach back method.  CBE unremarkable.  No mass or lump palpated.  Patient reports she had a pap with her primary physician at [facility] a couple of months ago.    Plan:     Sent for bilateral screening mammogram.

## 2017-01-21 ENCOUNTER — Telehealth: Payer: Self-pay | Admitting: Gastroenterology

## 2017-01-21 NOTE — Telephone Encounter (Signed)
Patient needs to reschedule colonoscopy. 

## 2017-01-21 NOTE — Progress Notes (Signed)
Letter mailed from [facility] to notify of normal mammogram results.  Patient to return in one year for annual screening.  Copy to [facility]. 

## 2017-01-22 NOTE — Telephone Encounter (Signed)
LVM for pt to return my call.

## 2017-01-22 NOTE — Telephone Encounter (Signed)
Pt rescheduled colonoscopy to 03/29/17 with [facility].   Please precert for:  Rectal polyp K62.1 Hx of colon polyps Z86.010  Colonoscopy: [phone]

## 2017-03-14 ENCOUNTER — Other Ambulatory Visit: Payer: Self-pay | Admitting: Family Medicine

## 2017-03-14 NOTE — Telephone Encounter (Signed)
Ok, 67, 2 ref

## 2017-03-14 NOTE — Telephone Encounter (Signed)
Last office visit 12/19/2016.  Not on current medication list.  Refill?

## 2017-03-14 NOTE — Telephone Encounter (Signed)
Tramadol called into [facility] [place], [address] AT [place] Phone: [phone]

## 2017-03-25 ENCOUNTER — Encounter: Payer: Self-pay | Admitting: *Deleted

## 2017-03-28 NOTE — Discharge Instructions (Signed)
General Anesthesia, Adult, Care After °These instructions provide you with information about caring for yourself after your procedure. Your health care provider may also give you more specific instructions. Your treatment has been planned according to current medical practices, but problems sometimes occur. Call your health care provider if you have any problems or questions after your procedure. °What can I expect after the procedure? °After the procedure, it is common to have: °· Vomiting. °· A sore throat. °· Mental slowness. ° °It is common to feel: °· Nauseous. °· Cold or shivery. °· Sleepy. °· Tired. °· Sore or achy, even in parts of your body where you did not have surgery. ° °Follow these instructions at home: °For at least 24 hours after the procedure: °· Do not: °? Participate in activities where you could fall or become injured. °? Drive. °? Use heavy machinery. °? Drink alcohol. °? Take sleeping pills or medicines that cause drowsiness. °? Make important decisions or sign legal documents. °? Take care of children on your own. °· Rest. °Eating and drinking °· If you vomit, drink water, juice, or soup when you can drink without vomiting. °· Drink enough fluid to keep your urine clear or pale yellow. °· Make sure you have little or no nausea before eating solid foods. °· Follow the diet recommended by your health care provider. °General instructions °· Have a responsible adult stay with you until you are awake and alert. °· Return to your normal activities as told by your health care provider. Ask your health care provider what activities are safe for you. °· Take over-the-counter and prescription medicines only as told by your health care provider. °· If you smoke, do not smoke without supervision. °· Keep all follow-up visits as told by your health care provider. This is important. °Contact a health care provider if: °· You continue to have nausea or vomiting at home, and medicines are not helpful. °· You  cannot drink fluids or start eating again. °· You cannot urinate after 8-12 hours. °· You develop a skin rash. °· You have fever. °· You have increasing redness at the site of your procedure. °Get help right away if: °· You have difficulty breathing. °· You have chest pain. °· You have unexpected bleeding. °· You feel that you are having a life-threatening or urgent problem. °This information is not intended to replace advice given to you by your health care provider. Make sure you discuss any questions you have with your health care provider. °Document Released: 01/14/2001 Document Revised: 03/12/2016 Document Reviewed: 09/22/2015 °Elsevier Interactive Patient Education © 2018 Elsevier Inc. ° °

## 2017-03-29 ENCOUNTER — Ambulatory Visit
Admission: RE | Admit: 2017-03-29 | Discharge: 2017-03-29 | Disposition: A | Discharge status: Home / Self Care | Payer: Self-pay | Source: Ambulatory Visit | Attending: Gastroenterology | Admitting: Gastroenterology

## 2017-03-29 ENCOUNTER — Ambulatory Visit: Payer: Self-pay | Admitting: Anesthesiology

## 2017-03-29 ENCOUNTER — Encounter
Admission: RE | Disposition: A | Discharge status: Home / Self Care | Payer: Self-pay | Source: Ambulatory Visit | Attending: Gastroenterology

## 2017-03-29 DIAGNOSIS — Z87891 Personal history of nicotine dependence: Secondary | ICD-10-CM | POA: Insufficient documentation

## 2017-03-29 DIAGNOSIS — Z1211 Encounter for screening for malignant neoplasm of colon: Secondary | ICD-10-CM | POA: Insufficient documentation

## 2017-03-29 DIAGNOSIS — Z79899 Other long term (current) drug therapy: Secondary | ICD-10-CM | POA: Insufficient documentation

## 2017-03-29 DIAGNOSIS — D125 Benign neoplasm of sigmoid colon: Secondary | ICD-10-CM | POA: Insufficient documentation

## 2017-03-29 DIAGNOSIS — I10 Essential (primary) hypertension: Secondary | ICD-10-CM | POA: Insufficient documentation

## 2017-03-29 DIAGNOSIS — D128 Benign neoplasm of rectum: Secondary | ICD-10-CM | POA: Insufficient documentation

## 2017-03-29 DIAGNOSIS — Z860100 Personal history of colon polyps, unspecified: Secondary | ICD-10-CM

## 2017-03-29 DIAGNOSIS — K621 Rectal polyp: Secondary | ICD-10-CM

## 2017-03-29 HISTORY — PX: POLYPECTOMY: SHX5525

## 2017-03-29 HISTORY — PX: COLONOSCOPY WITH PROPOFOL: SHX5780

## 2017-03-29 SURGERY — COLONOSCOPY WITH PROPOFOL
Anesthesia: Monitor Anesthesia Care | Wound class: Contaminated

## 2017-03-29 MED ORDER — LIDOCAINE HCL (CARDIAC) 20 MG/ML IV SOLN
INTRAVENOUS | Status: DC | PRN
  Administered 2017-03-29: 40 mg via INTRAVENOUS

## 2017-03-29 MED ORDER — STERILE WATER FOR IRRIGATION IR SOLN
Status: DC | PRN
  Administered 2017-03-29: 08:00:00

## 2017-03-29 MED ORDER — PROPOFOL 10 MG/ML IV BOLUS
INTRAVENOUS | Status: DC | PRN
  Administered 2017-03-29 (×2): 20 mg via INTRAVENOUS
  Administered 2017-03-29: 70 mg via INTRAVENOUS
  Administered 2017-03-29: 20 mg via INTRAVENOUS
  Administered 2017-03-29 (×2): 10 mg via INTRAVENOUS
  Administered 2017-03-29: 20 mg via INTRAVENOUS
  Administered 2017-03-29: 10 mg via INTRAVENOUS
  Administered 2017-03-29: 20 mg via INTRAVENOUS
  Administered 2017-03-29: 10 mg via INTRAVENOUS
  Administered 2017-03-29: 20 mg via INTRAVENOUS

## 2017-03-29 MED ORDER — LACTATED RINGERS IV SOLN
INTRAVENOUS | Status: DC
  Administered 2017-03-29: 08:00:00 via INTRAVENOUS

## 2017-03-29 SURGICAL SUPPLY — 23 items
CANISTER SUCT 1200ML W/VALVE (MISCELLANEOUS) ×4 IMPLANT
CLIP HMST 235XBRD CATH ROT (MISCELLANEOUS) IMPLANT
CLIP RESOLUTION 360 11X235 (MISCELLANEOUS)
FCP ESCP3.2XJMB 240X2.8X (MISCELLANEOUS)
FORCEPS BIOP RAD 4 LRG CAP 4 (CUTTING FORCEPS) ×4 IMPLANT
FORCEPS BIOP RJ4 240 W/NDL (MISCELLANEOUS)
FORCEPS ESCP3.2XJMB 240X2.8X (MISCELLANEOUS) IMPLANT
GOWN CVR UNV OPN BCK APRN NK (MISCELLANEOUS) ×4 IMPLANT
GOWN ISOL THUMB LOOP REG UNIV (MISCELLANEOUS) ×4
INJECTOR VARIJECT VIN23 (MISCELLANEOUS) IMPLANT
KIT DEFENDO VALVE AND CONN (KITS) IMPLANT
KIT ENDO PROCEDURE OLY (KITS) ×4 IMPLANT
MARKER SPOT ENDO TATTOO 5ML (MISCELLANEOUS) IMPLANT
PAD GROUND ADULT SPLIT (MISCELLANEOUS) ×4 IMPLANT
PROBE APC STR FIRE (PROBE) IMPLANT
RETRIEVER NET ROTH 2.5X230 LF (MISCELLANEOUS) IMPLANT
SNARE SHORT THROW 13M SML OVAL (MISCELLANEOUS) ×4 IMPLANT
SNARE SHORT THROW 30M LRG OVAL (MISCELLANEOUS) IMPLANT
SNARE SNG USE RND 15MM (INSTRUMENTS) IMPLANT
SPOT EX ENDOSCOPIC TATTOO (MISCELLANEOUS)
TRAP ETRAP POLY (MISCELLANEOUS) IMPLANT
VARIJECT INJECTOR VIN23 (MISCELLANEOUS)
WATER STERILE IRR 250ML POUR (IV SOLUTION) ×4 IMPLANT

## 2017-03-29 NOTE — H&P (Signed)
   Lucilla Lame, MD [address] [place], [place] [address] Phone:[phone] Fax : [phone]  Primary Care Physician:  Owens Loffler, MD Primary Gastroenterologist:  Dr. Allen Norris  Pre-Procedure History & Physical: HPI:  Jaime Johnson is a 56 y.o. female is here for an colonoscopy.   Past Medical History:  Diagnosis Date  . Allergic rhinitis due to pollen   . Anemia   . Arthritis    Hands, knees, shoulder  . Hypertension 12/19/2016  . Motion sickness    cars    Past Surgical History:  Procedure Laterality Date  . CESAREAN SECTION    . COLONOSCOPY WITH PROPOFOL N/A 11/12/2016   Procedure: COLONOSCOPY WITH PROPOFOL;  Surgeon: Lucilla Lame, MD;  Location: [facility];  Service: Endoscopy;  Laterality: N/A;  . POLYPECTOMY  11/12/2016   Procedure: POLYPECTOMY;  Surgeon: Lucilla Lame, MD;  Location: [facility];  Service: Endoscopy;;    Prior to Admission medications   Medication Sig Start Date End Date Taking? Authorizing Provider  IRON PO Take by mouth.   Yes [provider]  lisinopril-hydrochlorothiazide (PRINZIDE,ZESTORETIC) 10-12.5 MG tablet Take 1 tablet by mouth daily. 12/19/16  Yes Copland, Frederico Hamman, MD  traMADol (ULTRAM) 50 MG tablet TAKE 1 TABLET BY MOUTH EVERY 6 HOURS AS NEEDED 03/14/17  Yes Copland, Frederico Hamman, MD    Allergies as of 11/26/2016  . (No Known Allergies)    Family History  Problem Relation Age of Onset  . Hepatitis C Mother 56       d/c at 59  . Hepatitis C Brother   . Hypertension Father     Social History   Social History  . Marital status: Married    Spouse name: N/A  . Number of children: N/A  . Years of education: N/A   Occupational History  . Not on file.   Social History Main Topics  . Smoking status: Former Smoker    Quit date: 10/22/1978  . Smokeless tobacco: Never Used  . Alcohol use No  . Drug use: No  . Sexual activity: Yes    Partners: Male   Other Topics Concern  . Not on file    Social History Narrative  . No narrative on file    Review of Systems: See HPI, otherwise negative ROS  Physical Exam: BP (!) 158/80   Pulse 63   Temp 97.5 F (36.4 C) (Tympanic)   Resp 16   Ht 5\' 2"  (1.575 m)   Wt 152 lb (68.9 kg)   LMP 11/01/2016 (Exact Date) Comment: preg test neg  SpO2 96%   BMI 27.80 kg/m  General:   Alert,  pleasant and cooperative in NAD Head:  Normocephalic and atraumatic. Neck:  Supple; no masses or thyromegaly. Lungs:  Clear throughout to auscultation.    Heart:  Regular rate and rhythm. Abdomen:  Soft, nontender and nondistended. Normal bowel sounds, without guarding, and without rebound.   Neurologic:  Alert and  oriented x4;  grossly normal neurologically.  Impression/Plan: PATSIE MCCARDLE is here for an colonoscopy to be performed for history of rectal polyp  Risks, benefits, limitations, and alternatives regarding  colonoscopy have been reviewed with the patient.  Questions have been answered.  All parties agreeable.   Lucilla Lame, MD  03/29/2017, 7:31 AM

## 2017-03-29 NOTE — Anesthesia Procedure Notes (Signed)
Procedure Name: MAC Performed by: Lind Guest Pre-anesthesia Checklist: Patient identified, Emergency Drugs available, Suction available, Patient being monitored and Timeout performed Patient Re-evaluated:Patient Re-evaluated prior to inductionOxygen Delivery Method: Nasal cannula

## 2017-03-29 NOTE — Anesthesia Preprocedure Evaluation (Signed)
Anesthesia Evaluation  Patient identified by MRN, date of birth, ID band Patient awake    Airway Mallampati: II  TM Distance: >3 FB Neck ROM: Full    Dental   Pulmonary former smoker,    Pulmonary exam normal        Cardiovascular hypertension, Pt. on medications Normal cardiovascular exam     Neuro/Psych    GI/Hepatic   Endo/Other    Renal/GU      Musculoskeletal  (+) Arthritis , Osteoarthritis,    Abdominal   Peds  Hematology  (+) anemia ,   Anesthesia Other Findings   Reproductive/Obstetrics                             Anesthesia Physical Anesthesia Plan  ASA: II  Anesthesia Plan: MAC   Post-op Pain Management:    Induction: Intravenous  PONV Risk Score and Plan:   Airway Management Planned:   Additional Equipment:   Intra-op Plan:   Post-operative Plan:   Informed Consent: I have reviewed the patients History and Physical, chart, labs and discussed the procedure including the risks, benefits and alternatives for the proposed anesthesia with the patient or authorized representative who has indicated his/her understanding and acceptance.     Plan Discussed with: CRNA  Anesthesia Plan Comments:         Anesthesia Quick Evaluation

## 2017-03-29 NOTE — Transfer of Care (Signed)
Immediate Anesthesia Transfer of Care Note  Patient: Jaime Johnson  Procedure(s) Performed: Procedure(s): COLONOSCOPY WITH PROPOFOL (N/A) POLYPECTOMY  Patient Location: PACU  Anesthesia Type: MAC  Level of Consciousness: awake, alert  and patient cooperative  Airway and Oxygen Therapy: Patient Spontanous Breathing and Patient connected to supplemental oxygen  Post-op Assessment: Post-op Vital signs reviewed, Patient's Cardiovascular Status Stable, Respiratory Function Stable, Patent Airway and No signs of Nausea or vomiting  Post-op Vital Signs: Reviewed and stable  Complications: No apparent anesthesia complications  

## 2017-03-29 NOTE — Op Note (Signed)
[facility] Gastroenterology Patient Name: Jaime Johnson Procedure Date: 03/29/2017 7:38 AM MRN: [id number] Account #: [id number] Date of Birth: 1961-05-19 Admit Type: Outpatient Age: 56 Room: [facility] OR ROOM 01 Gender: Female Note Status: Finalized Procedure:            Colonoscopy Indications:          High risk colon cancer surveillance: Personal history                        of adenoma with high grade dysplasia Providers:            Lucilla Lame MD, MD Referring MD:         Maud Deed. Copland MD, MD (Referring MD) Medicines:            Propofol per Anesthesia Complications:        No immediate complications. Procedure:            Pre-Anesthesia Assessment:                       - Prior to the procedure, a History and Physical was                        performed, and patient medications and allergies were                        reviewed. The patient's tolerance of previous                        anesthesia was also reviewed. The risks and benefits of                        the procedure and the sedation options and risks were                        discussed with the patient. All questions were                        answered, and informed consent was obtained. Prior                        Anticoagulants: The patient has taken no previous                        anticoagulant or antiplatelet agents. ASA Grade                        Assessment: II - A patient with mild systemic disease.                        After reviewing the risks and benefits, the patient was                        deemed in satisfactory condition to undergo the                        procedure.                       After obtaining informed consent, the colonoscope was  passed under direct vision. Throughout the procedure,                        the patient's blood pressure, pulse, and oxygen                        saturations were monitored continuously. The [place] [id number]) was introduced through the                        anus and advanced to the the cecum, identified by                        appendiceal orifice and ileocecal valve. The                        colonoscopy was performed without difficulty. The                        patient tolerated the procedure well. The quality of                        the bowel preparation was excellent. Findings:      The perianal and digital rectal examinations were normal.      Two sessile polyps were found in the sigmoid colon. The polyps were 1 to       2 mm in size. These polyps were removed with a cold biopsy forceps.       Resection and retrieval were complete.      A 9 mm polyp was found in the rectum. The polyp was sessile. The polyp       was removed with a hot snare. Resection and retrieval were complete. Impression:           - Two 1 to 2 mm polyps in the sigmoid colon, removed                        with a cold biopsy forceps. Resected and retrieved.                       - One 9 mm polyp in the rectum, removed with a hot                        snare. Resected and retrieved. Recommendation:       - Discharge patient to home.                       - Resume previous diet.                       - Continue present medications.                       - Await pathology results.                       - Repeat colonoscopy in 3 years for surveillance. Procedure Code(s):    --- Professional ---  45385, Colonoscopy, flexible; with removal of tumor(s),                        polyp(s), or other lesion(s) by snare technique                       45380, 59, Colonoscopy, flexible; with biopsy, single                        or multiple Diagnosis Code(s):    --- Professional ---                       Z86.010, Personal history of colonic polyps                       D12.5, Benign neoplasm of sigmoid colon                       K62.1, Rectal polyp CPT  copyright 2016 American Medical Association. All rights reserved. The codes documented in this report are preliminary and upon coder review may  be revised to meet current compliance requirements. Lucilla Lame MD, MD 03/29/2017 8:21:52 AM This report has been signed electronically. Number of Addenda: 0 Note Initiated On: 03/29/2017 7:38 AM Scope Withdrawal Time: 0 hours 14 minutes 53 seconds  Total Procedure Duration: 0 hours 22 minutes 56 seconds       [facility]

## 2017-03-29 NOTE — Anesthesia Postprocedure Evaluation (Signed)
Anesthesia Post Note  Patient: Jaime Johnson  Procedure(s) Performed: Procedure(s) (LRB): COLONOSCOPY WITH PROPOFOL (N/A) POLYPECTOMY  Patient location during evaluation: PACU Anesthesia Type: MAC Level of consciousness: awake and alert Pain management: pain level controlled Vital Signs Assessment: post-procedure vital signs reviewed and stable Respiratory status: spontaneous breathing, nonlabored ventilation, respiratory function stable and patient connected to nasal cannula oxygen Cardiovascular status: stable and blood pressure returned to baseline Anesthetic complications: no    Marshell Levan

## 2017-04-02 ENCOUNTER — Encounter: Payer: Self-pay | Admitting: Gastroenterology

## 2017-04-05 ENCOUNTER — Encounter: Payer: Self-pay | Admitting: Gastroenterology

## 2017-04-23 ENCOUNTER — Telehealth: Payer: Self-pay

## 2017-04-23 NOTE — Telephone Encounter (Signed)
Dominica Severin, pt's [facility], called stating the summary he received did not have our address on it.  Therefore, their ins isn't accepting it.  He was adv by them if he could get a business card c our address and AMS's name it would be sufficient.  Dominica Severin aware business card is in the mail.

## 2017-12-10 ENCOUNTER — Other Ambulatory Visit: Payer: Self-pay | Admitting: Family Medicine

## 2017-12-10 NOTE — Telephone Encounter (Signed)
Last office visit 12/19/2016.  Last refilled 03/14/2017 for #50 with 2 refills.  Ok to refill?

## 2018-08-03 ENCOUNTER — Other Ambulatory Visit: Payer: Self-pay | Admitting: Family Medicine

## 2018-08-13 ENCOUNTER — Other Ambulatory Visit: Payer: Self-pay | Admitting: Family Medicine

## 2018-08-29 ENCOUNTER — Encounter: Payer: Self-pay | Admitting: Internal Medicine

## 2018-08-29 ENCOUNTER — Ambulatory Visit: Payer: Self-pay | Admitting: Internal Medicine

## 2018-08-29 VITALS — BP 158/100 | HR 62 | Temp 97.7°F | Wt 160.0 lb

## 2018-08-29 DIAGNOSIS — M069 Rheumatoid arthritis, unspecified: Secondary | ICD-10-CM

## 2018-08-29 DIAGNOSIS — I10 Essential (primary) hypertension: Secondary | ICD-10-CM

## 2018-08-29 MED ORDER — TRAMADOL HCL 50 MG PO TABS: 50.0000 mg | ORAL_TABLET | Freq: Two times a day (BID) | ORAL | 0 refills | Status: DC | PRN

## 2018-08-29 MED ORDER — LISINOPRIL-HYDROCHLOROTHIAZIDE 10-12.5 MG PO TABS: 1.0000 | ORAL_TABLET | Freq: Every day | ORAL | 0 refills | Status: DC

## 2018-08-29 NOTE — Progress Notes (Signed)
Subjective:    Patient ID: Jaime Johnson, female    DOB: April 22, 1961, 57 y.o.   MRN: [id number]  HPI  Pt presents to the clinic today for a refill of Tramadol. She reports she has rheumatoid arthritis. She takes Ibuprofen for mild to moderate pain. She takes Tramadol as needed for severe pain. She does not follow with rheumatology and her symptoms are not disabling at this time.  Of note, her BP today is 158/100. She has not taken her Lisinopril HCT in many months.She denies headache, chest pain or shortness of breath.   Review of Systems      Past Medical History:  Diagnosis Date  . Allergic rhinitis due to pollen   . Anemia   . Arthritis    Hands, knees, shoulder  . Hypertension 12/19/2016  . Motion sickness    cars    Current Outpatient Medications  Medication Sig Dispense Refill  . IRON PO Take by mouth.    . traMADol (ULTRAM) 50 MG tablet Take 1 tablet (50 mg total) by mouth every 12 (twelve) hours as needed. 30 tablet 0  . lisinopril-hydrochlorothiazide (PRINZIDE,ZESTORETIC) 10-12.5 MG tablet Take 1 tablet by mouth daily. 90 tablet 0   No current facility-administered medications for this visit.     No Known Allergies  Family History  Problem Relation Age of Onset  . Hepatitis C Mother 57       d/c at 68  . Hepatitis C Brother   . Hypertension Father     Social History   Socioeconomic History  . Marital status: Married    Spouse name: Not on file  . Number of children: Not on file  . Years of education: Not on file  . Highest education level: Not on file  Occupational History  . Not on file  Social Needs  . Financial resource strain: Not on file  . Food insecurity:    Worry: Not on file    Inability: Not on file  . Transportation needs:    Medical: Not on file    Non-medical: Not on file  Tobacco Use  . Smoking status: Former Smoker    Last attempt to quit: 10/22/1978    Years since quitting: 39.8  . Smokeless tobacco: Never Used  Substance and  Sexual Activity  . Alcohol use: No  . Drug use: No  . Sexual activity: Yes    Partners: Male  Lifestyle  . Physical activity:    Days per week: Not on file    Minutes per session: Not on file  . Stress: Not on file  Relationships  . Social connections:    Talks on phone: Not on file    Gets together: Not on file    Attends religious service: Not on file    Active member of club or organization: Not on file    Attends meetings of clubs or organizations: Not on file    Relationship status: Not on file  . Intimate partner violence:    Fear of current or ex partner: Not on file    Emotionally abused: Not on file    Physically abused: Not on file    Forced sexual activity: Not on file  Other Topics Concern  . Not on file  Social History Narrative  . Not on file     Constitutional: Denies fever, malaise, fatigue, headache or abrupt weight changes.  Respiratory: Denies difficulty breathing, shortness of breath, cough or sputum production.   Cardiovascular: Denies  chest pain, chest tightness, palpitations or swelling in the hands or feet.  Neurological: Denies dizziness, difficulty with memory, difficulty with speech or problems with balance and coordination.    No other specific complaints in a complete review of systems (except as listed in HPI above).  Objective:   Physical Exam   BP (!) 158/100   Pulse 62   Temp 97.7 F (36.5 C) (Oral)   Wt 160 lb (72.6 kg)   LMP 11/01/2016 (Exact Date) Comment: preg test neg  SpO2 98%   BMI 29.26 kg/m  Wt Readings from Last 3 Encounters:  08/29/18 160 lb (72.6 kg)  03/29/17 152 lb (68.9 kg)  01/16/17 155 lb (70.3 kg)    General: Appears her stated age, well developed, well nourished in NAD. Cardiovascular: Normal rate and rhythm. S1,S2 noted.  No murmur, rubs or gallops noted.  Pulmonary/Chest: Normal effort and positive vesicular breath sounds. No respiratory distress. No wheezes, rales or ronchi noted.  Neurological: Alert  and oriented.    BMET    Component Value Date/Time   NA 139 09/24/2016 1122   K 4.2 09/24/2016 1122   CL 106 09/24/2016 1122   CO2 25 09/24/2016 1122   GLUCOSE 102 (H) 09/24/2016 1122   BUN 10 09/24/2016 1122   CREATININE 0.75 09/24/2016 1122   CALCIUM 9.1 09/24/2016 1122    Lipid Panel     Component Value Date/Time   CHOL 199 09/24/2016 1122   TRIG 83.0 09/24/2016 1122   HDL 53.00 09/24/2016 1122   CHOLHDL 4 09/24/2016 1122   VLDL 16.6 09/24/2016 1122   LDLCALC 129 (H) 09/24/2016 1122    CBC    Component Value Date/Time   WBC 4.9 12/17/2016 1526   RBC 5.62 (H) 12/17/2016 1526   HGB 13.6 12/17/2016 1526   HCT 41.7 12/17/2016 1526   PLT 286.0 12/17/2016 1526   MCV 74.2 (L) 12/17/2016 1526   MCHC 32.5 12/17/2016 1526   RDW 22.2 (H) 12/17/2016 1526   LYMPHSABS 1.5 12/17/2016 1526   MONOABS 0.3 12/17/2016 1526   EOSABS 0.1 12/17/2016 1526   BASOSABS 0.1 12/17/2016 1526    Hgb A1C No results found for: HGBA1C         Assessment & Plan:   Rheumatoid Arthritis:  She declines referral to rheumatology at this time Continue Ibuprofen Tramadol refilled today  HTN:  Refilled Lisinopril HCT today Reinforced DASH diet and exercise for weight loss  Follow up with PCP for annual exam Webb Silversmith, NP

## 2018-08-29 NOTE — Patient Instructions (Signed)

## 2018-10-08 ENCOUNTER — Telehealth: Payer: Self-pay | Admitting: Family Medicine

## 2018-10-08 ENCOUNTER — Encounter: Payer: Self-pay | Admitting: Family Medicine

## 2018-10-08 NOTE — Telephone Encounter (Signed)
Left message asking pt to call office Please let pt know her appointment on 11/10/18 with dr copland time has been changed to 9:40 also mailed letter

## 2018-10-20 ENCOUNTER — Other Ambulatory Visit: Payer: Self-pay | Admitting: Family Medicine

## 2018-10-20 DIAGNOSIS — Z1159 Encounter for screening for other viral diseases: Secondary | ICD-10-CM

## 2018-10-20 DIAGNOSIS — Z0000 Encounter for general adult medical examination without abnormal findings: Secondary | ICD-10-CM

## 2018-10-27 ENCOUNTER — Other Ambulatory Visit (INDEPENDENT_AMBULATORY_CARE_PROVIDER_SITE_OTHER): Payer: Self-pay

## 2018-10-27 DIAGNOSIS — Z0000 Encounter for general adult medical examination without abnormal findings: Secondary | ICD-10-CM

## 2018-10-27 DIAGNOSIS — Z1159 Encounter for screening for other viral diseases: Secondary | ICD-10-CM

## 2018-10-27 LAB — CBC WITH DIFFERENTIAL/PLATELET
BASOS PCT: 0.8 % (ref 0.0–3.0)
Basophils Absolute: 0 10*3/uL (ref 0.0–0.1)
EOS ABS: 0.1 10*3/uL (ref 0.0–0.7)
EOS PCT: 2.7 % (ref 0.0–5.0)
HEMATOCRIT: 46.4 % — AB (ref 36.0–46.0)
HEMOGLOBIN: 15.7 g/dL — AB (ref 12.0–15.0)
LYMPHS PCT: 24.6 % (ref 12.0–46.0)
Lymphs Abs: 1.4 10*3/uL (ref 0.7–4.0)
MCHC: 33.9 g/dL (ref 30.0–36.0)
MCV: 83.7 fl (ref 78.0–100.0)
MONOS PCT: 9.5 % (ref 3.0–12.0)
Monocytes Absolute: 0.5 10*3/uL (ref 0.1–1.0)
Neutro Abs: 3.5 10*3/uL (ref 1.4–7.7)
Neutrophils Relative %: 62.4 % (ref 43.0–77.0)
Platelets: 234 10*3/uL (ref 150.0–400.0)
RBC: 5.54 Mil/uL — ABNORMAL HIGH (ref 3.87–5.11)
RDW: 13 % (ref 11.5–15.5)
WBC: 5.5 10*3/uL (ref 4.0–10.5)

## 2018-10-27 LAB — HEPATIC FUNCTION PANEL
ALBUMIN: 4.4 g/dL (ref 3.5–5.2)
ALT: 62 U/L — ABNORMAL HIGH (ref 0–35)
AST: 34 U/L (ref 0–37)
Alkaline Phosphatase: 56 U/L (ref 39–117)
Bilirubin, Direct: 0.1 mg/dL (ref 0.0–0.3)
Total Bilirubin: 0.6 mg/dL (ref 0.2–1.2)
Total Protein: 7.7 g/dL (ref 6.0–8.3)

## 2018-10-27 LAB — LIPID PANEL
Cholesterol: 224 mg/dL — ABNORMAL HIGH (ref 0–200)
HDL: 52.8 mg/dL (ref >39.00)
LDL Cholesterol: 138 mg/dL — ABNORMAL HIGH (ref 0–99)
NonHDL: 170.72
Total CHOL/HDL Ratio: 4
Triglycerides: 165 mg/dL — ABNORMAL HIGH (ref 0.0–149.0)
VLDL: 33 mg/dL (ref 0.0–40.0)

## 2018-10-27 LAB — BASIC METABOLIC PANEL
BUN: 9 mg/dL (ref 6–23)
CO2: 32 mEq/L (ref 19–32)
CREATININE: 0.78 mg/dL (ref 0.40–1.20)
Calcium: 9.7 mg/dL (ref 8.4–10.5)
Chloride: 104 mEq/L (ref 96–112)
GFR: 80.47 mL/min (ref >60.00)
GLUCOSE: 119 mg/dL — AB (ref 70–99)
POTASSIUM: 4.3 meq/L (ref 3.5–5.1)
Sodium: 143 mEq/L (ref 135–145)

## 2018-10-27 LAB — TSH: TSH: 5.92 u[IU]/mL — ABNORMAL HIGH (ref 0.35–4.50)

## 2018-10-28 LAB — HEPATITIS C ANTIBODY
Hepatitis C Ab: NONREACTIVE
SIGNAL TO CUT-OFF: 0.04 (ref <1.00)

## 2018-11-09 ENCOUNTER — Encounter: Payer: Self-pay | Admitting: Family Medicine

## 2018-11-09 NOTE — Progress Notes (Signed)
Dr. Frederico Hamman T. Shourya Macpherson, MD, [facility] [place], [address] Phone: [phone] Fax: [phone]  11/10/2018  Patient: Jaime Johnson, MRN: [id number], DOB: 11-01-1960, 58 y.o.  Primary Physician:  Owens Loffler, MD   Chief Complaint  Patient presents with  . Annual Exam   Subjective:   Jaime Johnson is a 58 y.o. pleasant patient who presents with the following:  Health Maintenance Summary Reviewed and updated, unless pt declines services.  Tobacco History Reviewed. Non-smoker Alcohol: No concerns, no excessive use Exercise Habits: Some activity, rec at least 30 mins 5 times a week STD concerns: none Drug Use: None Birth control method: Menses regular: no Lumps or breast concerns: no Breast Cancer Family History: no  [facility] care at [facility]  Hand arthritis some now Sx for about 10-15 minutes  Polyarthralgia and DIP and PIP arthritic changes.  Health Maintenance  Topic Date Due  . HIV Screening  04/11/1975  . PAP SMEAR-Modifier  10/07/2015  . INFLUENZA VACCINE  01/20/2019 (Originally 05/22/2018)  . MAMMOGRAM  01/17/2019  . COLONOSCOPY  03/29/2020  . TETANUS/TDAP  07/03/2023  . Hepatitis C Screening  Completed    Immunization History  Administered Date(s) Administered  . Influenza, Seasonal, Injecte, Preservative Fre 07/24/2016  . Pneumococcal Polysaccharide-23 09/21/2018  . Tdap 07/02/2013  . Zoster Recombinat (Shingrix) 06/23/2018, 09/15/2018   Patient Active Problem List   Diagnosis Date Noted  . Personal history of colonic polyps   . Hypertension 12/19/2016  . Special screening for malignant neoplasms, colon   . Polyp of sigmoid colon   . Rectal polyp   . Abnormal uterine bleeding (AUB) 06/22/2014  . Acute blood loss anemia 05/26/2014  . Allergic rhinitis due to pollen    Past Medical History:  Diagnosis Date  . Allergic rhinitis due to pollen   . Anemia   . Arthritis    Hands,  knees, shoulder  . Hypertension 12/19/2016  . Motion sickness    cars   Past Surgical History:  Procedure Laterality Date  . CESAREAN SECTION    . COLONOSCOPY WITH PROPOFOL N/A 11/12/2016   Procedure: COLONOSCOPY WITH PROPOFOL;  Surgeon: Lucilla Lame, MD;  Location: [place];  Service: Endoscopy;  Laterality: N/A;  . COLONOSCOPY WITH PROPOFOL N/A 03/29/2017   Procedure: COLONOSCOPY WITH PROPOFOL;  Surgeon: Lucilla Lame, MD;  Location: [place] [facility];  Service: Endoscopy;  Laterality: N/A;  . POLYPECTOMY  11/12/2016   Procedure: POLYPECTOMY;  Surgeon: Lucilla Lame, MD;  Location: [place];  Service: Endoscopy;;  . POLYPECTOMY  03/29/2017   Procedure: POLYPECTOMY;  Surgeon: Lucilla Lame, MD;  Location: [facility];  Service: Endoscopy;;   Social History   Socioeconomic History  . Marital status: Married    Spouse name: Not on file  . Number of children: Not on file  . Years of education: Not on file  . Highest education level: Not on file  Occupational History  . Not on file  Social Needs  . Financial resource strain: Not on file  . Food insecurity:    Worry: Not on file    Inability: Not on file  . Transportation needs:    Medical: Not on file    Non-medical: Not on file  Tobacco Use  . Smoking status: Former Smoker    Last attempt to quit: 10/22/1978    Years since quitting: 40.0  . Smokeless tobacco: Never Used  Substance and Sexual Activity  .  Alcohol use: No  . Drug use: No  . Sexual activity: Yes    Partners: Male  Lifestyle  . Physical activity:    Days per week: Not on file    Minutes per session: Not on file  . Stress: Not on file  Relationships  . Social connections:    Talks on phone: Not on file    Gets together: Not on file    Attends religious service: Not on file    Active member of club or organization: Not on file    Attends meetings of clubs or organizations: Not on file    Relationship status: Not on file  . Intimate  partner violence:    Fear of current or ex partner: Not on file    Emotionally abused: Not on file    Physically abused: Not on file    Forced sexual activity: Not on file  Other Topics Concern  . Not on file  Social History Narrative  . Not on file   Family History  Problem Relation Age of Onset  . Hepatitis C Mother 20       d/c at 59  . Hepatitis C Brother   . Hypertension Father    No Known Allergies  Medication list has been reviewed and updated.   General: Denies fever, chills, sweats. No significant weight loss. Eyes: Denies blurring,significant itching ENT: Denies earache, sore throat, and hoarseness.  Cardiovascular: Denies chest pains, palpitations, dyspnea on exertion,  Respiratory: Denies cough, dyspnea at rest,wheeezing Breast: no concerns about lumps GI: Denies nausea, vomiting, diarrhea, constipation, change in bowel habits, abdominal pain, melena, hematochezia GU: Denies dysuria, hematuria, urinary hesitancy, nocturia, denies STD risk, no concerns about discharge Musculoskeletal: Denies back pain, joint pain Derm: Denies rash, itching Neuro: Denies  paresthesias, frequent falls, frequent headaches Psych: Denies depression, anxiety Endocrine: Denies cold intolerance, heat intolerance, polydipsia Heme: Denies enlarged lymph nodes Allergy: No hayfever  Objective:   BP 140/90   Pulse 82   Temp 97.6 F (36.4 C) (Oral)   Ht 5' 2.5" (1.588 m)   Wt 156 lb 8 oz (71 kg)   LMP 11/01/2016 (Exact Date) Comment: preg test neg  SpO2 97%   BMI 28.17 kg/m  No exam data present  GEN: well developed, well nourished, no acute distress Eyes: conjunctiva and lids normal, PERRLA, EOMI ENT: TM clear, nares clear, oral exam WNL Neck: supple, no lymphadenopathy, no thyromegaly, no JVD Pulm: clear to auscultation and percussion, respiratory effort normal CV: regular rate and rhythm, S1-S2, no murmur, rub or gallop, no bruits Chest: no scars, masses, no lumps BREAST:  breast exam declined GI: soft, non-tender; no hepatosplenomegaly, masses; active bowel sounds all quadrants GU: GU exam declined Lymph: no cervical, axillary or inguinal adenopathy MSK: gait normal, muscle tone and strength WNL. Notable diffuse DIP > PIP enlargement, pain without synovitis SKIN: clear, good turgor, color WNL, no rashes, lesions, or ulcerations Neuro: normal mental status, normal strength, sensation, and motion Psych: alert; oriented to person, place and time, normally interactive and not anxious or depressed in appearance.   All labs reviewed with patient. Lipids: Lab Results  Component Value Date   CHOL 224 (H) 10/27/2018   Lab Results  Component Value Date   HDL 52.80 10/27/2018   Lab Results  Component Value Date   LDLCALC 138 (H) 10/27/2018   Lab Results  Component Value Date   TRIG 165.0 (H) 10/27/2018   Lab Results  Component Value Date   CHOLHDL  4 10/27/2018   CBC: CBC Latest Ref Rng & Units 10/27/2018 12/17/2016 09/24/2016  WBC 4.0 - 10.5 K/uL 5.5 4.9 3.4(L)  Hemoglobin 12.0 - 15.0 g/dL 15.7(H) 13.6 9.1(L)  Hematocrit 36.0 - 46.0 % 46.4(H) 41.7 29.7(L)  Platelets 150.0 - 400.0 K/uL 234.0 286.0 947.1    Basic Metabolic Panel:    Component Value Date/Time   NA 143 10/27/2018 1018   K 4.3 10/27/2018 1018   CL 104 10/27/2018 1018   CO2 32 10/27/2018 1018   BUN 9 10/27/2018 1018   CREATININE 0.78 10/27/2018 1018   GLUCOSE 119 (H) 10/27/2018 1018   CALCIUM 9.7 10/27/2018 1018   Hepatic Function Latest Ref Rng & Units 10/27/2018 05/26/2014 10/06/2012  Total Protein 6.0 - 8.3 g/dL 7.7 6.9 7.0  Albumin 3.5 - 5.2 g/dL 4.4 3.9 4.2  AST 0 - 37 U/L 34 25 20  ALT 0 - 35 U/L 62(H) 26 24  Alk Phosphatase 39 - 117 U/L 56 35(L) 32(L)  Total Bilirubin 0.2 - 1.2 mg/dL 0.6 0.4 0.4  Bilirubin, Direct 0.0 - 0.3 mg/dL 0.1 0.0 0.0    No results found for: HGBA1C Lab Results  Component Value Date   TSH 5.92 (H) 10/27/2018   No results found.  Assessment and  Plan:   Healthcare maintenance  Abnormal TSH - Plan: TSH, T4, free, T3, free  Polyarthralgia - Plan: ANA Screen,IFA,Reflex Titer/Pattern,Reflex Mplx 11 Ab Cascade with IdentRA, Sedimentation rate, High sensitivity CRP, CANCELED: Rheumatoid factor, CANCELED: Cyclic citrul peptide antibody, IgG  Further work-up for rheum do Recheck thyroid labs in 1 mo  Health Maintenance Exam: The patient's preventative maintenance and recommended screening tests for an annual wellness exam were reviewed in full today. Brought up to date unless services declined.  Counselled on the importance of diet, exercise, and its role in overall health and mortality. The patient's FH and SH was reviewed, including their home life, tobacco status, and drug and alcohol status.  Follow-up in 1 year for physical exam or additional follow-up below.  Follow-up: Return in about 1 month (around 12/11/2018) for bloodwork only. Or follow-up in 1 year if not noted.  Signed,  Maud Deed. Synthia Fairbank, MD   Allergies as of 11/10/2018   No Known Allergies     Medication List       Accurate as of November 10, 2018 11:12 AM. Always use your most recent med list.        IRON PO Take by mouth.   lisinopril-hydrochlorothiazide 10-12.5 MG tablet Commonly known as:  PRINZIDE,ZESTORETIC Take 1 tablet by mouth daily.   traMADol 50 MG tablet Commonly known as:  ULTRAM Take 1 tablet (50 mg total) by mouth every 12 (twelve) hours as needed.

## 2018-11-10 ENCOUNTER — Ambulatory Visit: Payer: Self-pay | Admitting: Family Medicine

## 2018-11-10 VITALS — BP 140/90 | HR 82 | Temp 97.6°F | Ht 62.5 in | Wt 156.5 lb

## 2018-11-10 DIAGNOSIS — Z0000 Encounter for general adult medical examination without abnormal findings: Secondary | ICD-10-CM

## 2018-11-10 DIAGNOSIS — R7989 Other specified abnormal findings of blood chemistry: Secondary | ICD-10-CM

## 2018-11-10 DIAGNOSIS — M2550 Pain in unspecified joint: Secondary | ICD-10-CM

## 2018-11-10 MED ORDER — TRAMADOL HCL 50 MG PO TABS: 50.0000 mg | ORAL_TABLET | Freq: Two times a day (BID) | ORAL | 2 refills | Status: DC | PRN

## 2018-11-10 MED ORDER — LISINOPRIL-HYDROCHLOROTHIAZIDE 10-12.5 MG PO TABS: 1.0000 | ORAL_TABLET | Freq: Every day | ORAL | 3 refills | Status: DC

## 2018-11-17 ENCOUNTER — Telehealth: Payer: Self-pay | Admitting: *Deleted

## 2018-11-17 NOTE — Telephone Encounter (Signed)
Record release was faxed to [facility] requesting last pap smear results on 11/10/2018.  Received request back today stating No Pap on File.

## 2018-12-15 ENCOUNTER — Other Ambulatory Visit (INDEPENDENT_AMBULATORY_CARE_PROVIDER_SITE_OTHER): Payer: Self-pay

## 2018-12-15 DIAGNOSIS — M2550 Pain in unspecified joint: Secondary | ICD-10-CM

## 2018-12-15 DIAGNOSIS — R7989 Other specified abnormal findings of blood chemistry: Secondary | ICD-10-CM

## 2018-12-15 LAB — TSH: TSH: 5.51 u[IU]/mL — ABNORMAL HIGH (ref 0.35–4.50)

## 2018-12-15 LAB — HIGH SENSITIVITY CRP: CRP, High Sensitivity: 1.37 mg/L (ref 0.000–5.000)

## 2018-12-15 LAB — T3, FREE: T3 FREE: 3.3 pg/mL (ref 2.3–4.2)

## 2018-12-15 LAB — T4, FREE: FREE T4: 0.84 ng/dL (ref 0.60–1.60)

## 2018-12-15 LAB — SEDIMENTATION RATE: SED RATE: 4 mm/h (ref 0–30)

## 2018-12-18 LAB — TIER 1
Chromatin (Nucleosomal) Antibody: <1 AI
DS DNA AB: 11 [IU]/mL — AB
ENA SM Ab Ser-aCnc: <1 AI
Ribonucleic Protein(ENA) Antibody, IgG: <1 AI
SM/RNP: NEGATIVE AI

## 2018-12-18 LAB — ANA SCREEN,IFA,REFLEX TITER/PATTERN,REFLEX MPLX 11 AB CASCADE
14-3-3 eta Protein: <0.2 ng/mL (ref <0.2)
Anti Nuclear Antibody(ANA): POSITIVE — AB
Cyclic Citrullin Peptide Ab: <16 UNITS
Rhuematoid fact SerPl-aCnc: <14 IU/mL (ref <14)

## 2018-12-18 LAB — INTERPRETATION

## 2018-12-18 LAB — ANTI-NUCLEAR AB-TITER (ANA TITER)

## 2018-12-22 ENCOUNTER — Telehealth: Payer: Self-pay | Admitting: *Deleted

## 2018-12-22 NOTE — Telephone Encounter (Signed)
Lab results discussed with patient via telephone.  See result note.

## 2018-12-22 NOTE — Telephone Encounter (Signed)
-----   Message from Owens Loffler, MD sent at 12/22/2018 10:47 AM EST ----- Please call.  On her rheumatological labs, the patient's ANA was positive as well as a another lupus test called double-stranded DNA.  All rheumatoid arthritis labs were negative.  I think that it would probably be a good idea to have her see rheumatology.  This is somewhat a problem, because most of the rheumatologist in our region do not accept patients who do not have medical insurance.  If she would like, I can see if 1 of them can see her either in [place], and if that is not an available option then possibly at [facility].  Thyroid testing is normal.  TSH test is mildly abnormal, but with a normal free T3 and normal free T4, this is essentially nothing to worry about.

## 2018-12-22 NOTE — Telephone Encounter (Signed)
Left message for Jaime Johnson to return my call in regards to her lab results.

## 2018-12-23 ENCOUNTER — Other Ambulatory Visit: Payer: Self-pay | Admitting: Family Medicine

## 2018-12-23 DIAGNOSIS — M2550 Pain in unspecified joint: Secondary | ICD-10-CM

## 2018-12-23 DIAGNOSIS — R768 Other specified abnormal immunological findings in serum: Secondary | ICD-10-CM

## 2018-12-23 NOTE — Progress Notes (Signed)
Consult rheum if possible

## 2019-02-26 ENCOUNTER — Telehealth: Payer: Self-pay | Admitting: Family Medicine

## 2019-02-26 NOTE — Telephone Encounter (Signed)
Left message for Zarrah with detailed information below from Dr. Lorelei Pont.

## 2019-02-26 NOTE — Telephone Encounter (Signed)
In our region, the only way to be tested outside of the hospital is to call [phone].  The health department will do this for high risk people by vehicle.  You have to make an appointment.  This is being done from 10 AM to 3 PM Monday - Friday at [facility].  This is dictated by [place] and the Health Department.  We are updated multiple times a day about Covid-19, and this is the only way to do it.

## 2019-02-26 NOTE — Telephone Encounter (Signed)
Patient called today in regards to being tested for [place] patient the phone number that we were provided that she could call and get schedule to be tested.  Patient declined and stated that [facility] would do the test for free they just needed permission from her PCP for them to do this test.   Patient would like a call back.    PHONE- [phone]

## 2019-04-13 DIAGNOSIS — G5793 Unspecified mononeuropathy of bilateral lower limbs: Secondary | ICD-10-CM | POA: Insufficient documentation

## 2019-04-13 DIAGNOSIS — Z13820 Encounter for screening for osteoporosis: Secondary | ICD-10-CM | POA: Insufficient documentation

## 2019-05-04 DIAGNOSIS — R200 Anesthesia of skin: Secondary | ICD-10-CM | POA: Insufficient documentation

## 2019-05-04 DIAGNOSIS — M159 Polyosteoarthritis, unspecified: Secondary | ICD-10-CM | POA: Insufficient documentation

## 2019-06-24 ENCOUNTER — Other Ambulatory Visit: Payer: Self-pay | Admitting: Family Medicine

## 2019-06-24 NOTE — Telephone Encounter (Signed)
Last office visit 11/10/2018 for CPE.  Last refilled 11/10/2018 for #40 with 2 refills.   No future appointments.

## 2019-08-25 ENCOUNTER — Ambulatory Visit: Payer: Self-pay | Admitting: Student in an Organized Health Care Education/Training Program

## 2019-09-24 NOTE — Progress Notes (Signed)
Patient's Name: Jaime Johnson  MRN: [id number]  Referring Provider: Marlowe Sax*  DOB: 08-May-1961  PCP: Owens Loffler, MD  DOS: 09/29/2019  Note by: Gillis Santa, MD  Service setting: Ambulatory outpatient  Specialty: Interventional Pain Management  Location: [facility] Virtual Visit  Visit type: Initial Patient Evaluation  Patient type: New Patient   Pain Management Virtual Encounter Note - Virtual Visit via Nelson (real-time audio visits between healthcare provider and patient).   Patient's Phone No.:  [phone] (home); [phone] (mobile); (Preferred) [phone] [email or web address]  [id number] [address], [place] AT [facility] OF [address] [place] [address] Phone: [phone] Fax: [phone]    Pre-screening note:  Our staff contacted [name] and offered her an "in person", "face-to-face" appointment versus a telephone encounter. She indicated preferring the telephone encounter, at this time.  Primary Reason(s) for Visit: Tele-Encounter for initial evaluation of one or more chronic problems (new to examiner) potentially causing chronic pain, and posing a threat to normal musculoskeletal function. (Level of risk: High) CC: Neck Pain  I contacted Jaime Johnson on 09/29/2019 via video conference.      I clearly identified myself as Gillis Santa, MD. I verified that I was speaking with the correct person using two identifiers (Name: Jaime Johnson, and date of birth: 09/18/1961).  Advanced Informed Consent I sought verbal advanced consent from Jaime Johnson for virtual visit interactions. I informed [name] of possible security and privacy concerns, risks, and limitations associated with providing "not-in-person" medical evaluation and management services. I also informed [name] of the availability of "in-person" appointments. Finally, I informed her that there would be a  charge for the virtual visit and that she could be  personally, fully or partially, financially responsible for it. [name] expressed understanding and agreed to proceed.   HPI  [name] is a 58 y.o. year old, female patient, contacted today for an initial evaluation of her chronic pain. She has Allergic rhinitis due to pollen; Acute blood loss anemia; Abnormal uterine bleeding (AUB); Special screening for malignant neoplasms, colon; Polyp of sigmoid colon; Rectal polyp; Hypertension; Personal history of colonic polyps; Cervical radicular pain (RIGHT); Foraminal stenosis of cervical region (RIGHT); Bilateral carpal tunnel syndrome; and Cervical radiculopathy on their problem list.  Pain Assessment: Location:   Neck Radiating: right arm with numbness Onset: More than a month ago Duration: Chronic pain Quality: Numbness Severity: 5 /10 (subjective, self-reported pain score)  Effect on ADL:   Timing: Constant Modifying factors:    Onset and Duration: Gradual and Present longer than 3 months Cause of pain: Unknown Severity: Getting worse, NAS-11 at its worse: 7/10, NAS-11 at its best: 3/10, NAS-11 now: 3/10 and NAS-11 on the average: 5/10 Timing: Night, During activity or exercise and After activity or exercise Aggravating Factors: gripping small objects Alleviating Factors: Hot packs and massage Associated Problems: Numbness Quality of Pain: Heavy and Numb Previous Examinations or Tests: X-rays, Neurological evaluation and Orthopedic evaluation Previous Treatments: Epidural steroid injections  Patient is a very pleasant 58 year old female who presents with a chief complaint of right-sided neck pain with radiation into her right arm and forearm and right hand.  This is been a chronic problem but is gone very severe over the last 6 months.  Patient is endorsing weakness and numbness of her right hand.  She is dropping items approximately once a day.  She is  having issues with fine motor  skills like brushing her hair and brushing her teeth.  Patient had similar pain complaints in 2010.  She had a cervical MRI done at that time, results are below which showed cervical neuroforaminal stenosis, moderate at C4-C5 and C5-C6.  She had a cervical epidural steroid injection done by my partner, Dr. Dossie Arbour which was very helpful for her.  She states that this provided her with significant pain relief over the last 9 years approximately.  Of note she has been evaluated by rheumatology and neurology.  Work-up has been negative for rheumatoid arthritis and lupus although she is ANA positive.  She did see a neurologist in August, Dr. Manuella Ghazi and EMG and nerve conduction velocity studies revealed minimal, grade 1 carpal tunnel syndrome bilaterally.   Patient utilizes ibuprofen as needed.  She also utilizes tramadol very sparingly, usually taken 1 tablet every 3 to 4 days when her pain is very severe.    Meds   Current Outpatient Medications:  .  gabapentin (NEURONTIN) 300 MG capsule, Take 1 capsule (300 mg total) by mouth at bedtime for 15 days, THEN 1 capsule (300 mg total) 2 (two) times daily., Disp: 75 capsule, Rfl: 0 .  IRON PO, Take by mouth., Disp: , Rfl:  .  tiZANidine (ZANAFLEX) 4 MG tablet, Take 1 tablet (4 mg total) by mouth at bedtime as needed for muscle spasms., Disp: 30 tablet, Rfl: 1  ROS  Cardiovascular: No reported cardiovascular signs or symptoms such as High blood pressure, coronary artery disease, abnormal heart rate or rhythm, heart attack, blood thinner therapy or heart weakness and/or failure Pulmonary or Respiratory: No reported pulmonary signs or symptoms such as wheezing and difficulty taking a deep full breath (Asthma), difficulty blowing air out (Emphysema), coughing up mucus (Bronchitis), persistent dry cough, or temporary stoppage of breathing during sleep Neurological: No reported neurological signs or symptoms such as seizures, abnormal skin sensations, urinary and/or  fecal incontinence, being born with an abnormal open spine and/or a tethered spinal cord Review of Past Neurological Studies: No results found for this or any previous visit. Psychological-Psychiatric: No reported psychological or psychiatric signs or symptoms such as difficulty sleeping, anxiety, depression, delusions or hallucinations (schizophrenial), mood swings (bipolar disorders) or suicidal ideations or attempts Gastrointestinal: No reported gastrointestinal signs or symptoms such as vomiting or evacuating blood, reflux, heartburn, alternating episodes of diarrhea and constipation, inflamed or scarred liver, or pancreas or irrregular and/or infrequent bowel movements Genitourinary: Difficulty emptying the bladder or controlling the flow of urine (Neurogenic bladder) Hematological: No reported hematological signs or symptoms such as prolonged bleeding, low or poor functioning platelets, bruising or bleeding easily, hereditary bleeding problems, low energy levels due to low hemoglobin or being anemic Endocrine: No reported endocrine signs or symptoms such as high or low blood sugar, rapid heart rate due to high thyroid levels, obesity or weight gain due to slow thyroid or thyroid disease Rheumatologic: No reported rheumatological signs and symptoms such as fatigue, joint pain, tenderness, swelling, redness, heat, stiffness, decreased range of motion, with or without associated rash Musculoskeletal: Negative for myasthenia gravis, muscular dystrophy, multiple sclerosis or malignant hyperthermia Work History: Retired  Allergies  Ms. [place] has No Known Allergies.  Laboratory Chemistry Profile    Inflammation (CRP: Acute Phase) (ESR: Chronic Phase) Lab Results  Component Value Date   ESRSEDRATE 4 12/15/2018  Rheumatology Lab Results  Component Value Date   RF <14 12/15/2018   ANA POSITIVE (A) 12/15/2018                        Renal Lab Results  Component  Value Date   BUN 9 10/27/2018   CREATININE 0.78 10/27/2018   GFR 80.47 10/27/2018                             Hepatic Lab Results  Component Value Date   AST 34 10/27/2018   ALT 62 (H) 10/27/2018   ALBUMIN 4.4 10/27/2018   ALKPHOS 56 10/27/2018                        Electrolytes Lab Results  Component Value Date   NA 143 10/27/2018   K 4.3 10/27/2018   CL 104 10/27/2018   CALCIUM 9.7 10/27/2018                         Coagulation Lab Results  Component Value Date   PLT 234.0 10/27/2018                        Cardiovascular Lab Results  Component Value Date   HGB 15.7 (H) 10/27/2018   HCT 46.4 (H) 10/27/2018                         ID No results found for: LYMEIGGIGMAB, HIV, [place], STAPHAUREUS, MRSAPCR, HCVAB, PREGTESTUR, MICROTEXT  Cancer No results found for: CEA, CA125, LABCA2                      Endocrine Lab Results  Component Value Date   TSH 5.51 (H) 12/15/2018   FREET4 0.84 12/15/2018                        Note: Lab results reviewed.  Imaging Review   RESULT:     Non-gadolinium MR imaging of the cervical spine is performed  in  the sagittal and axial planes. The study demonstrates disc protrusion  centrally and to the right at the C4-C5 level. There is disc protrusion  centrally and to the right at C5-C6 causing right-sided foraminal  narrowing  that is mild to moderate. There is obliteration of the CSF signal anterior  to the cord to the right side at this level. There is also central disc  herniation at the C6-C7 level which causes mild spinal canal narrowing  centrally and to the right. At other levels the spinal canal and foramina  appear to be patent. Spinal cord signal appears to be normal. There is no  syrinx. There does not appear to be significant flattening of the cord  itself. The marrow signal appears to be normal. Some hypertrophic  degenerative endplate spurring is present. The cervicomedullary junction  and  pons  show normal signal.   IMPRESSION:      Multilevel degenerative disc disease with areas of spinal  canal narrowing and foraminal narrowing worse centrally and to the right  as  described. Surgical correlation is recommended.   Thank you for the opportunity to contribute to the care of your patient   Complexity Note: Imaging results reviewed. Results shared with Ms. Norell, using Layman's terms.  PFSH  Drug: Ms. Coultas  reports no history of drug use. Alcohol:  reports no history of alcohol use. Tobacco:  reports that she quit smoking about 40 years ago. She has never used smokeless tobacco. Medical:  has a past medical history of Allergic rhinitis due to pollen, Anemia, Arthritis, Hypertension (12/19/2016), and Motion sickness. Family: family history includes Hepatitis C in her brother; Hepatitis C (age of onset: 41) in her mother; Hypertension in her father.  Past Surgical History:  Procedure Laterality Date  . CESAREAN SECTION    . COLONOSCOPY WITH PROPOFOL N/A 11/12/2016   Procedure: COLONOSCOPY WITH PROPOFOL;  Surgeon: Lucilla Lame, MD;  Location: [place];  Service: Endoscopy;  Laterality: N/A;  . COLONOSCOPY WITH PROPOFOL N/A 03/29/2017   Procedure: COLONOSCOPY WITH PROPOFOL;  Surgeon: Lucilla Lame, MD;  Location: [place];  Service: Endoscopy;  Laterality: N/A;  . POLYPECTOMY  11/12/2016   Procedure: POLYPECTOMY;  Surgeon: Lucilla Lame, MD;  Location: [place];  Service: Endoscopy;;  . POLYPECTOMY  03/29/2017   Procedure: POLYPECTOMY;  Surgeon: Lucilla Lame, MD;  Location: [place];  Service: Endoscopy;;   Active Ambulatory Problems    Diagnosis Date Noted  . Allergic rhinitis due to pollen   . Acute blood loss anemia 05/26/2014  . Abnormal uterine bleeding (AUB) 06/22/2014  . Special screening for malignant neoplasms, colon   . Polyp of sigmoid colon   . Rectal polyp   . Hypertension 12/19/2016  . Personal  history of colonic polyps   . Cervical radicular pain (RIGHT) 09/29/2019  . Foraminal stenosis of cervical region (RIGHT) 09/29/2019  . Bilateral carpal tunnel syndrome 09/29/2019  . Cervical radiculopathy 09/29/2019   Resolved Ambulatory Problems    Diagnosis Date Noted  . CAP (community acquired pneumonia) 06/29/2016  . Acute dysfunction of left eustachian tube 06/29/2016  . Palpitations 06/29/2016  . Post-infection bronchospasm 06/29/2016  . Dyspnea 06/29/2016   Past Medical History:  Diagnosis Date  . Anemia   . Arthritis   . Motion sickness    Assessment  Primary Diagnosis & Pertinent Problem List: The primary encounter diagnosis was Cervical radicular pain (RIGHT C4/5 and C5/6). Diagnoses of Foraminal stenosis of cervical region (RIGHT), Bilateral carpal tunnel syndrome, and Cervical radiculopathy were also pertinent to this visit.  Visit Diagnosis (New problems to examiner): 1. 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 Bilateral carpal tunnel syndrome   4. Cervical radiculopathy     General Recommendations: The pain condition that the patient suffers from is best treated with a multidisciplinary approach that involves an increase in physical activity to prevent de-conditioning and worsening of the pain cycle, as well as psychological counseling (formal and/or informal) to address the co-morbid psychological affects of pain. Treatment will often involve judicious use of pain medications and interventional procedures to decrease the pain, allowing the patient to participate in the physical activity that will ultimately produce long-lasting pain reductions. The goal of the multidisciplinary approach is to return the patient to a higher level of overall function and to restore their ability to perform activities of daily living.   1. Cervical radicular pain (RIGHT) -History of C4-C5 and C5-C6 right neuroforaminal  stenosis and associated right cervical radicular pain.  Positive therapeutic right cervical epidural steroid injection performed approximately 10 years ago with my partner, Dr. Dossie Arbour. -Given worsening of radicular symptoms and possible even myelopathic symptoms on the right, recommend repeat cervical  MRI without contrast. - gabapentin (NEURONTIN) 300 MG capsule; Take 1 capsule (300 mg total) by mouth at bedtime for 15 days, THEN 1 capsule (300 mg total) 2 (two) times daily.  Dispense: 75 capsule; Refill: 0 - tiZANidine (ZANAFLEX) 4 MG tablet; Take 1 tablet (4 mg total) by mouth at bedtime as needed for muscle spasms.  Dispense: 30 tablet; Refill: 1 - Ambulatory referral to Physical Therapy - MRI C-Spine w/o contrast; Future   Plan of Care (Initial workup plan)   Imaging Orders     MRI C-Spine w/o contrast  Referral Orders     Ambulatory referral to Physical Therapy Pharmacotherapy (current): Medications ordered:  Meds ordered this encounter  Medications  . gabapentin (NEURONTIN) 300 MG capsule    Sig: Take 1 capsule (300 mg total) by mouth at bedtime for 15 days, THEN 1 capsule (300 mg total) 2 (two) times daily.    Dispense:  75 capsule    Refill:  0  . tiZANidine (ZANAFLEX) 4 MG tablet    Sig: Take 1 tablet (4 mg total) by mouth at bedtime as needed for muscle spasms.    Dispense:  30 tablet    Refill:  1    Do not place this medication, or any other prescription from our practice, on "Automatic Refill". Patient may have prescription filled one day early if pharmacy is closed on scheduled refill date.   Medications administered during this visit: [facility] had no medications administered during this visit.   Pharmacological management options:  Opioid Analgesics: The patient was informed that there is no guarantee that she would be a candidate for opioid analgesics. The decision will be made following CDC guidelines. This decision will be based on the results of diagnostic  studies, as well as Ms. Marinaro's risk profile.   Membrane stabilizer: Trial of Neurontin  Muscle relaxant: Tizanidine as above  NSAID: Avoid given history of abnormal uterine bleeding   Other analgesic(s): To be determined at a later time   Interventional management options: Ms. Joffe was informed that there is no guarantee that she would be a candidate for interventional therapies. The decision will be based on the results of diagnostic studies, as well as Ms. Swendsen's risk profile.  Procedure(s) under consideration:  Cervical epidural steroid injection   Provider-requested follow-up: Return for After Imaging.  No future appointments.  Total duration of non-face-to-face encounter: 45 minutes.  Primary Care Physician: Owens Loffler, MD Location: [facility] Note by: Gillis Santa, MD Date: 09/29/2019; Time: 1:48 PM  Note: This dictation was prepared with Dragon dictation. Any transcriptional errors that may result from this process are unintentional.

## 2019-09-29 ENCOUNTER — Encounter: Payer: Self-pay | Admitting: Student in an Organized Health Care Education/Training Program

## 2019-09-29 ENCOUNTER — Other Ambulatory Visit: Payer: Self-pay

## 2019-09-29 ENCOUNTER — Ambulatory Visit
Payer: Self-pay | Attending: Student in an Organized Health Care Education/Training Program | Admitting: Student in an Organized Health Care Education/Training Program

## 2019-09-29 DIAGNOSIS — M4802 Spinal stenosis, cervical region: Secondary | ICD-10-CM | POA: Insufficient documentation

## 2019-09-29 DIAGNOSIS — G5603 Carpal tunnel syndrome, bilateral upper limbs: Secondary | ICD-10-CM

## 2019-09-29 DIAGNOSIS — M5412 Radiculopathy, cervical region: Secondary | ICD-10-CM | POA: Insufficient documentation

## 2019-09-29 MED ORDER — TIZANIDINE HCL 4 MG PO TABS: 4.0000 mg | ORAL_TABLET | Freq: Every evening | ORAL | 1 refills | Status: AC | PRN

## 2019-09-29 MED ORDER — GABAPENTIN 300 MG PO CAPS: ORAL_CAPSULE | ORAL | 0 refills | Status: DC

## 2019-11-05 ENCOUNTER — Other Ambulatory Visit: Payer: Self-pay

## 2019-11-05 ENCOUNTER — Ambulatory Visit
Admission: RE | Admit: 2019-11-05 | Discharge: 2019-11-05 | Disposition: A | Discharge status: Home / Self Care | Payer: Self-pay | Source: Ambulatory Visit | Attending: Student in an Organized Health Care Education/Training Program | Admitting: Student in an Organized Health Care Education/Training Program

## 2019-11-05 DIAGNOSIS — M5412 Radiculopathy, cervical region: Secondary | ICD-10-CM | POA: Insufficient documentation

## 2019-11-09 ENCOUNTER — Telehealth: Payer: Self-pay | Admitting: Student in an Organized Health Care Education/Training Program

## 2019-11-09 NOTE — Telephone Encounter (Signed)
Orders from NP visit are to return after MRI. She needs a VV to discuss.

## 2019-11-09 NOTE — Telephone Encounter (Signed)
Patient would like results of MRI

## 2019-11-23 ENCOUNTER — Telehealth: Payer: Self-pay

## 2019-11-23 ENCOUNTER — Encounter: Payer: Self-pay | Admitting: Student in an Organized Health Care Education/Training Program

## 2019-11-23 NOTE — Telephone Encounter (Signed)
Unable to leave message, mailbox is full.

## 2019-11-24 ENCOUNTER — Other Ambulatory Visit: Payer: Self-pay

## 2019-11-24 ENCOUNTER — Encounter: Payer: Self-pay | Admitting: Student in an Organized Health Care Education/Training Program

## 2019-11-24 ENCOUNTER — Ambulatory Visit
Payer: Self-pay | Attending: Student in an Organized Health Care Education/Training Program | Admitting: Student in an Organized Health Care Education/Training Program

## 2019-11-24 DIAGNOSIS — M4802 Spinal stenosis, cervical region: Secondary | ICD-10-CM

## 2019-11-24 DIAGNOSIS — M5412 Radiculopathy, cervical region: Secondary | ICD-10-CM

## 2019-11-24 NOTE — Progress Notes (Signed)
Patient: Jaime Johnson  Service Category: E/M  Provider: Gillis Santa, MD  DOB: 11-Mar-1961  DOS: 11/24/2019  Location: Office  MRN: [id number]  Setting: Ambulatory outpatient  Referring Provider: Owens Loffler, MD  Type: Established Patient  Specialty: Interventional Pain Management  PCP: Owens Loffler, MD  Location: Home  Delivery: TeleHealth     Virtual Encounter - Pain Management PROVIDER NOTE: Information contained herein reflects review and annotations entered in association with encounter. Interpretation of such information and data should be left to medically-trained personnel. Information provided to patient can be located elsewhere in the medical record under "Patient Instructions". Document created using STT-dictation technology, any transcriptional errors that may result from process are unintentional.    Contact & Pharmacy Preferred: [phone] Home: [phone] (home) Mobile: [phone] (mobile) E-mail: [name] [place], [address] [place] [address] Phone: [phone] Fax: [phone]   Pre-screening  Jaime Johnson offered "in-person" vs "virtual" encounter. She indicated preferring virtual for this encounter.   Reason COVID-19*  Social distancing based on CDC and AMA recommendations.   I contacted Jaime Johnson on 11/24/2019 via telephone.      I clearly identified myself as Gillis Santa, MD. I verified that I was speaking with the correct person using two identifiers (Name: Jaime Johnson, and date of birth: 1961/05/21).  This visit was completed via telephone due to the restrictions of the COVID-19 pandemic. All issues as above were discussed and addressed but no physical exam was performed. If it was felt that the patient should be evaluated in the office, they were directed there. The patient verbally consented to this visit. Patient was unable to complete an  audio/visual visit due to Technical difficulties and/or Lack of internet. Due to the catastrophic nature of the COVID-19 pandemic, this visit was done through audio contact only.  Location of the patient: home address (see Epic for details)  Location of the provider: office Consent I sought verbal advanced consent from Jaime Johnson for virtual visit interactions. I informed Jaime Johnson of possible security and privacy concerns, risks, and limitations associated with providing "not-in-person" medical evaluation and management services. I also informed Jaime Johnson of the availability of "in-person" appointments. Finally, I informed her that there would be a charge for the virtual visit and that she could be  personally, fully or partially, financially responsible for it. Jaime Johnson expressed understanding and agreed to proceed.   Historic Elements   Ms. Jaime Johnson is a 58 y.o. year old, female patient evaluated today after her last encounter by our practice on 11/23/2019. Jaime Johnson  has a past medical history of Allergic rhinitis due to pollen, Anemia, Arthritis, Hypertension (12/19/2016), and Motion sickness. She also  has a past surgical history that includes Cesarean section; Colonoscopy with propofol (N/A, 11/12/2016); polypectomy (11/12/2016); Colonoscopy with propofol (N/A, 03/29/2017); and polypectomy (03/29/2017). Jaime Johnson has a current medication list which includes the following prescription(s): gabapentin, ferrous sulfate, lisinopril-hydrochlorothiazide, lumify, systane complete, tizanidine, and tramadol. She  reports that she quit smoking about 41 years ago. She has never used smokeless tobacco. She reports that she does not drink alcohol or use drugs. Jaime Johnson is allergic to azithromycin.   HPI  Today, she is being contacted for follow-up evaluation   Reviewed MRI results as below Continuing to endorse neck pain and radiating arm pain (right arm worse than left) Right  arm radiates to  hands/fingers Left arm radiates to forearm Have difficulty with strength and fine motor movement  Laboratory Chemistry Profile (12 mo)  Renal: No results found for requested labs within last 8760 hours.  Lab Results  Component Value Date   GFR 80.47 10/27/2018   Hepatic: No results found for requested labs within last 8760 hours. Lab Results  Component Value Date   AST 34 10/27/2018   ALT 62 (H) 10/27/2018   Other: 12/15/2018: Sed Rate 4  Note: Above Lab results reviewed.  Imaging  MRI C-Spine w/o contrast CLINICAL DATA:  Neck pain extending down the right arm to the hand.  EXAM: MRI CERVICAL SPINE WITHOUT CONTRAST  TECHNIQUE: Multiplanar, multisequence MR imaging of the cervical spine was performed. No intravenous contrast was administered.  COMPARISON:  Cervical spine MRI dated 01/26/2009  FINDINGS: Alignment: Mild reversal of the normal cervical lordosis.  Vertebrae: No significant vertebral marrow edema is identified. Mild disc desiccation throughout the cervical spine. Congenitally short pedicles.  Cord: No significant abnormal spinal cord signal is observed.  Posterior Fossa, vertebral arteries, paraspinal tissues: 1.5 by 0.9 cm right thyroid nodule, previously 1.3 by 0.8 cm on 01/26/2009.  Disc levels:  C2-3: Borderline central narrowing of the thecal sac due to short pedicles and small central disc protrusion.  C3-4: Moderate left foraminal stenosis and mild central narrowing of the thecal sac due to disc bulge and short pedicles. The left foraminal impingement is worsened from prior.  C4-5: Mild central narrowing of the thecal sac due to disc bulge, right eccentric intervertebral spurring, and short pedicles. Not significantly changed from prior.  C5-6: Moderate to severe right foraminal stenosis with moderate right paracentral central narrowing of the thecal sac due to intervertebral spurring, right paracentral disc protrusion, disc bulge, short  pedicles. Impingement at this level is minimally worsened from prior.  C6-7: Moderate right foraminal stenosis and moderate central narrowing of the thecal sac due to disc bulge, intervertebral spurring, and short pedicles. Impingement mildly worsened from prior.  C7-T1: No impingement. Right facet arthropathy.  T1-2: Borderline central narrowing of the thecal sac due to disc bulge and short pedicles.  IMPRESSION: 1. Cervical spondylosis congenitally short pedicles, and degenerative disc disease, causing moderate to severe impingement at C5-6; moderate impingement at C3-4 and C6-7; and mild impingement at C4-5, as detailed above. 2. 1.6 by 0.9 cm right thyroid nodule. This lesion was minimally smaller on 01/26/2009. The near stability over the intervening nearly 11 years is compatible with a benign etiology.  Electronically Signed   By: Van Clines M.D.   On: 11/05/2019 14:17   Assessment  The primary encounter diagnosis was Cervical radicular pain (RIGHT C4/5 and C5/6). Diagnoses of Foraminal stenosis of cervical region (RIGHT) and Cervical radiculopathy were also pertinent to this visit.  Plan of Care  I am having Jaime Johnson maintain her IRON PO, gabapentin, tiZANidine, lisinopril-hydrochlorothiazide, traMADol, Systane Complete, and Lumify.  Had extensive discussion with the patient regarding her cervical MRI findings.  Patient has severe impingement at right C5 and C6 and moderate impingement at C3-C4 and C6-C7.  She has multilevel degenerative changes and associated cervical spondylosis.  For her cervical radicular symptoms, we discussed cervical epidural steroid injection.  She had this done approximately 10 years ago with my partner, Dr. Dossie Arbour.  Risks and benefits of this procedure were reviewed.  Patient states that she would like to discuss this with her husband and if she is interested, she will call our clinic  to get it scheduled.  I am okay if the patient has  this procedure done with either myself or my partner Dr. Dossie Arbour.  Orders:  Orders Placed This Encounter  Procedures  . Cervical Epidural Injection    Procedure: Cervical Epidural Steroid Injection/Block Purpose: Diagnostic Indication(s): Radiculitis and/or cervicalgia associater with cervical degenerative disc disease.    Standing Status:   Standing    Number of Occurrences:   6    Standing Expiration Date:   05/23/2021    Scheduling Instructions:     Level(s): C7-T1     Laterality: TBD     Sedation: Patient's choice.     Timeframe: PRN    Order Specific Question:   Where will this procedure be performed?    Answer:   [facility]    Comments:   Jaime Johnson   Follow-up plan:   Return if symptoms worsen or fail to improve.     Recent Visits Date Type Provider Dept  09/29/19 Office Visit Gillis Santa, MD [facility]  Showing recent visits within past 90 days and meeting all other requirements   Today's Visits Date Type Provider Dept  11/24/19 Office Visit Gillis Santa, MD [facility]  Showing today's visits and meeting all other requirements   Future Appointments No visits were found meeting these conditions.  Showing future appointments within next 90 days and meeting all other requirements   I discussed the assessment and treatment plan with the patient. The patient was provided an opportunity to ask questions and all were answered. The patient agreed with the plan and demonstrated an understanding of the instructions.  Patient advised to call back or seek an in-person evaluation if the symptoms or condition worsens.  Duration of encounter: 30 minutes.  Note by: Gillis Santa, MD Date: 11/24/2019; Time: 3:42 PM

## 2019-12-16 ENCOUNTER — Telehealth: Payer: Self-pay

## 2019-12-16 ENCOUNTER — Telehealth: Payer: Self-pay | Admitting: *Deleted

## 2019-12-16 NOTE — Telephone Encounter (Signed)
Attempted to reach patient, mailbox is full.

## 2019-12-16 NOTE — Telephone Encounter (Signed)
Attempted to call patient back, no answer Unable to leave message-mailbox full.

## 2019-12-16 NOTE — Telephone Encounter (Signed)
2nd attempt to reach patient, mailbox is full.

## 2019-12-16 NOTE — Telephone Encounter (Signed)
Attempted to call patient back, no answer, unable to leave message-mailbox full.

## 2019-12-17 ENCOUNTER — Other Ambulatory Visit: Payer: Self-pay

## 2019-12-17 ENCOUNTER — Ambulatory Visit
Payer: Self-pay | Attending: Student in an Organized Health Care Education/Training Program | Admitting: Student in an Organized Health Care Education/Training Program

## 2019-12-17 DIAGNOSIS — M792 Neuralgia and neuritis, unspecified: Secondary | ICD-10-CM

## 2019-12-17 DIAGNOSIS — M4802 Spinal stenosis, cervical region: Secondary | ICD-10-CM

## 2019-12-17 DIAGNOSIS — M5412 Radiculopathy, cervical region: Secondary | ICD-10-CM

## 2019-12-17 MED ORDER — PREGABALIN 50 MG PO CAPS: ORAL_CAPSULE | ORAL | 0 refills | Status: DC

## 2019-12-17 MED ORDER — PREDNISONE 20 MG PO TABS: ORAL_TABLET | ORAL | 0 refills | Status: AC

## 2019-12-17 NOTE — Progress Notes (Signed)
Patient: Jaime Johnson  Service Category: E/M  Provider: Gillis Santa, MD  DOB: 1961-06-22  DOS: 12/17/2019  Location: Office  MRN: [id number]  Setting: Ambulatory outpatient  Referring Provider: Owens Loffler, MD  Type: Established Patient  Specialty: Interventional Pain Management  PCP: Owens Loffler, MD  Location: Home  Delivery: TeleHealth     Virtual Encounter - Pain Management PROVIDER NOTE: Information contained herein reflects review and annotations entered in association with encounter. Interpretation of such information and data should be left to medically-trained personnel. Information provided to patient can be located elsewhere in the medical record under "Patient Instructions". Document created using STT-dictation technology, any transcriptional errors that may result from process are unintentional.    Contact & Pharmacy Preferred: [phone] Home: [phone] (home) Mobile: [phone] (mobile) E-mail: [email or web address] .[name] [place], [place] [address] [place] [address] Phone: [phone] Fax: [phone]   Pre-screening  Ms. Jaime Johnson offered "in-person" vs "virtual" encounter. She indicated preferring virtual for this encounter.   Reason COVID-19*  Social distancing based on CDC and AMA recommendations.   I contacted [name] on 12/17/2019 via telephone.      I clearly identified myself as Gillis Santa, MD. I verified that I was speaking with the correct person using two identifiers (Name: Jaime Johnson, and date of birth: 13-Jan-1961).  This visit was completed via telephone due to the restrictions of the COVID-19 pandemic. All issues as above were discussed and addressed but no physical exam was performed. If it was felt that the patient should be evaluated in the office, they were directed there. The patient verbally consented to this visit. Patient was unable to complete an  audio/visual visit due to Technical difficulties and/or Lack of internet. Due to the catastrophic nature of the COVID-19 pandemic, this visit was done through audio contact only.  Location of the patient: home address (see Epic for details)  Location of the provider: office Consent I sought verbal advanced consent from [name] for virtual visit interactions. I informed Jaime Johnson of possible security and privacy concerns, risks, and limitations associated with providing "not-in-person" medical evaluation and management services. I also informed Jaime Johnson of the availability of "in-person" appointments. Finally, I informed her that there would be a charge for the virtual visit and that she could be  personally, fully or partially, financially responsible for it. Jaime Johnson expressed understanding and agreed to proceed.   Historic Elements   Jaime Johnson is a 59 y.o. year old, female patient evaluated today after her last contact with our practice on 12/16/2019. Jaime Johnson  has a past medical history of Allergic rhinitis due to pollen, Anemia, Arthritis, Hypertension (12/19/2016), and Motion sickness. She also  has a past surgical history that includes Cesarean section; Colonoscopy with propofol (N/A, 11/12/2016); polypectomy (11/12/2016); Colonoscopy with propofol (N/A, 03/29/2017); and polypectomy (03/29/2017). Jaime Johnson has a current medication list which includes the following prescription(s): ferrous sulfate, lisinopril-hydrochlorothiazide, lumify, systane complete, tramadol, prednisone, and pregabalin. She  reports that she quit smoking about 41 years ago. She has never used smokeless tobacco. She reports that she does not drink alcohol or use drugs. Jaime Johnson is allergic to azithromycin.   HPI  Today, she is being contacted for worsening of previously known (established) problem   Patient describes worsening of her numbness and weakness in her hands bilaterally.  She is scheduled  for  a cervical epidural steroid injection April 5 for cervical radiculopathy.  She has severe impingement at right C5 and C6 and moderate impingement at C3-C4 and C6 and C7.  In the meantime, will prescribe prednisone taper as below.  Unfortunately, the patient did not receive any significant benefit with gabapentin titration 300 mg BID.  Will trial Lyrica as below.   Laboratory Chemistry Profile   Renal Lab Results  Component Value Date   BUN 9 10/27/2018   CREATININE 0.78 10/27/2018   GFR 80.47 10/27/2018    Hepatic Lab Results  Component Value Date   AST 34 10/27/2018   ALT 62 (H) 10/27/2018   ALBUMIN 4.4 10/27/2018   ALKPHOS 56 10/27/2018    Electrolytes Lab Results  Component Value Date   NA 143 10/27/2018   K 4.3 10/27/2018   CL 104 10/27/2018   CALCIUM 9.7 10/27/2018    Bone No results found for: VD25OH, VD125OH2TOT, YE2336PQ2, ES9753YY5, 25OHVITD1, 25OHVITD2, 25OHVITD3, TESTOFREE, TESTOSTERONE  Inflammation (CRP: Acute Phase) (ESR: Chronic Phase) Lab Results  Component Value Date   ESRSEDRATE 4 12/15/2018      Note: Above Lab results reviewed.  Imaging  MRI C-Spine w/o contrast CLINICAL DATA:  Neck pain extending down the right arm to the hand.  EXAM: MRI CERVICAL SPINE WITHOUT CONTRAST  TECHNIQUE: Multiplanar, multisequence MR imaging of the cervical spine was performed. No intravenous contrast was administered.  COMPARISON:  Cervical spine MRI dated 01/26/2009  FINDINGS: Alignment: Mild reversal of the normal cervical lordosis.  Vertebrae: No significant vertebral marrow edema is identified. Mild disc desiccation throughout the cervical spine. Congenitally short pedicles.  Cord: No significant abnormal spinal cord signal is observed.  Posterior Fossa, vertebral arteries, paraspinal tissues: 1.5 by 0.9 cm right thyroid nodule, previously 1.3 by 0.8 cm on 01/26/2009.  Disc levels:  C2-3: Borderline central narrowing of the thecal sac due to  short pedicles and small central disc protrusion.  C3-4: Moderate left foraminal stenosis and mild central narrowing of the thecal sac due to disc bulge and short pedicles. The left foraminal impingement is worsened from prior.  C4-5: Mild central narrowing of the thecal sac due to disc bulge, right eccentric intervertebral spurring, and short pedicles. Not significantly changed from prior.  C5-6: Moderate to severe right foraminal stenosis with moderate right paracentral central narrowing of the thecal sac due to intervertebral spurring, right paracentral disc protrusion, disc bulge, short pedicles. Impingement at this level is minimally worsened from prior.  C6-7: Moderate right foraminal stenosis and moderate central narrowing of the thecal sac due to disc bulge, intervertebral spurring, and short pedicles. Impingement mildly worsened from prior.  C7-T1: No impingement. Right facet arthropathy.  T1-2: Borderline central narrowing of the thecal sac due to disc bulge and short pedicles.  IMPRESSION: 1. Cervical spondylosis congenitally short pedicles, and degenerative disc disease, causing moderate to severe impingement at C5-6; moderate impingement at C3-4 and C6-7; and mild impingement at C4-5, as detailed above. 2. 1.6 by 0.9 cm right thyroid nodule. This lesion was minimally smaller on 01/26/2009. The near stability over the intervening nearly 11 years is compatible with a benign etiology.  Electronically Signed   By: Van Clines M.D.   On: 11/05/2019 14:17  Assessment  The primary encounter diagnosis was Cervical radicular pain (RIGHT C4/5 and C5/6). Diagnoses of Foraminal stenosis of cervical region (RIGHT), Cervical radiculopathy, and Neuropathic pain were also pertinent to this visit.  Plan of Care  Jaime Johnson has a  current medication list which includes the following long-term medication(s): pregabalin.  Pharmacotherapy (Medications  Ordered): Meds ordered this encounter  Medications  . predniSONE (DELTASONE) 20 MG tablet    Sig: Take 3 tablets (60 mg total) by mouth daily with breakfast for 3 days, THEN 2 tablets (40 mg total) daily with breakfast for 3 days, THEN 1 tablet (20 mg total) daily with breakfast for 3 days.    Dispense:  18 tablet    Refill:  0  . pregabalin (LYRICA) 50 MG capsule    Sig: Take 1 capsule (50 mg total) by mouth at bedtime for 20 days, THEN 1 capsule (50 mg total) 2 (two) times daily.    Dispense:  100 capsule    Refill:  0   Follow-up plan:   Return for Keep sch. appt.    Recent Visits Date Type Provider Dept  11/24/19 Office Visit Gillis Santa, MD [facility]  09/29/19 Office Visit Gillis Santa, MD [facility]  Showing recent visits within past 90 days and meeting all other requirements   Today's Visits Date Type Provider Dept  12/17/19 Office Visit Gillis Santa, MD [facility]  Showing today's visits and meeting all other requirements   Future Appointments Date Type Provider Dept  01/25/20 Appointment Gillis Santa, MD [facility]  Showing future appointments within next 90 days and meeting all other requirements   I discussed the assessment and treatment plan with the patient. The patient was provided an opportunity to ask questions and all were answered. The patient agreed with the plan and demonstrated an understanding of the instructions.  Patient advised to call backor seek an in-person evaluation if the symptoms or condition worsens.  Duration of encounter: 15 minutes.  Note by: Gillis Santa, MD Date: 12/17/2019; Time: 9:37 AM

## 2019-12-31 ENCOUNTER — Telehealth: Payer: Self-pay | Admitting: *Deleted

## 2019-12-31 MED ORDER — LISINOPRIL-HYDROCHLOROTHIAZIDE 10-12.5 MG PO TABS: 1.0000 | ORAL_TABLET | Freq: Every day | ORAL | 0 refills | Status: DC

## 2019-12-31 MED ORDER — TRAMADOL HCL 50 MG PO TABS: 50.0000 mg | ORAL_TABLET | Freq: Every day | ORAL | 0 refills | Status: DC

## 2019-12-31 NOTE — Telephone Encounter (Signed)
She should get a follow-up office visit with me this spring, physical or follow-up is ok, her preference

## 2019-12-31 NOTE — Telephone Encounter (Signed)
Last office visit 11/10/2018 for CPE.  Last refilled Tramadol 07/12/2019 for #40 with 2 refills.  Seen by Dr. Holley Raring at [facility] 12/17/2019.  No future appointments with PCP.  Refill?

## 2020-01-01 NOTE — Telephone Encounter (Signed)
Patient scheduled. Labs 4/19, CPE 4/26.

## 2020-01-25 ENCOUNTER — Ambulatory Visit
Admission: RE | Admit: 2020-01-25 | Discharge: 2020-01-25 | Disposition: A | Discharge status: Home / Self Care | Payer: Self-pay | Source: Ambulatory Visit | Attending: Student in an Organized Health Care Education/Training Program | Admitting: Student in an Organized Health Care Education/Training Program

## 2020-01-25 ENCOUNTER — Ambulatory Visit (HOSPITAL_BASED_OUTPATIENT_CLINIC_OR_DEPARTMENT_OTHER): Payer: Self-pay | Admitting: Student in an Organized Health Care Education/Training Program

## 2020-01-25 ENCOUNTER — Encounter: Payer: Self-pay | Admitting: Student in an Organized Health Care Education/Training Program

## 2020-01-25 ENCOUNTER — Other Ambulatory Visit: Payer: Self-pay

## 2020-01-25 VITALS — BP 161/85 | HR 66 | Temp 97.6°F | Resp 20 | Ht 62.0 in | Wt 156.0 lb

## 2020-01-25 DIAGNOSIS — M5412 Radiculopathy, cervical region: Secondary | ICD-10-CM | POA: Insufficient documentation

## 2020-01-25 DIAGNOSIS — M4802 Spinal stenosis, cervical region: Secondary | ICD-10-CM | POA: Insufficient documentation

## 2020-01-25 MED ORDER — FENTANYL CITRATE (PF) 100 MCG/2ML IJ SOLN
25.0000 ug | INTRAMUSCULAR | Status: DC | PRN
  Administered 2020-01-25: 50 ug via INTRAVENOUS

## 2020-01-25 MED ORDER — LIDOCAINE HCL 2 % IJ SOLN
INTRAMUSCULAR | Status: AC
  Filled 2020-01-25: qty 20

## 2020-01-25 MED ORDER — DEXAMETHASONE SODIUM PHOSPHATE 10 MG/ML IJ SOLN
INTRAMUSCULAR | Status: AC
  Filled 2020-01-25: qty 1

## 2020-01-25 MED ORDER — SODIUM CHLORIDE 0.9% FLUSH
1.0000 mL | Freq: Once | INTRAVENOUS | Status: AC
  Administered 2020-01-25: 1 mL

## 2020-01-25 MED ORDER — IOHEXOL 180 MG/ML  SOLN
INTRAMUSCULAR | Status: AC
  Filled 2020-01-25: qty 20

## 2020-01-25 MED ORDER — DEXAMETHASONE SODIUM PHOSPHATE 10 MG/ML IJ SOLN
10.0000 mg | Freq: Once | INTRAMUSCULAR | Status: AC
  Administered 2020-01-25: 10 mg

## 2020-01-25 MED ORDER — SODIUM CHLORIDE (PF) 0.9 % IJ SOLN
INTRAMUSCULAR | Status: AC
  Filled 2020-01-25: qty 10

## 2020-01-25 MED ORDER — ROPIVACAINE HCL 2 MG/ML IJ SOLN
1.0000 mL | Freq: Once | INTRAMUSCULAR | Status: AC
  Administered 2020-01-25: 1 mL via EPIDURAL

## 2020-01-25 MED ORDER — ROPIVACAINE HCL 2 MG/ML IJ SOLN
INTRAMUSCULAR | Status: AC
  Filled 2020-01-25: qty 10

## 2020-01-25 MED ORDER — IOHEXOL 180 MG/ML  SOLN
10.0000 mL | Freq: Once | INTRAMUSCULAR | Status: AC
  Administered 2020-01-25: 10 mL via EPIDURAL

## 2020-01-25 MED ORDER — LIDOCAINE HCL 2 % IJ SOLN
20.0000 mL | Freq: Once | INTRAMUSCULAR | Status: AC
  Administered 2020-01-25: 400 mg

## 2020-01-25 MED ORDER — FENTANYL CITRATE (PF) 100 MCG/2ML IJ SOLN
INTRAMUSCULAR | Status: AC
  Filled 2020-01-25: qty 2

## 2020-01-25 NOTE — Progress Notes (Signed)
Safety precautions to be maintained throughout the outpatient stay will include: orient to surroundings, keep bed in low position, maintain call bell within reach at all times, provide assistance with transfer out of bed and ambulation.  

## 2020-01-25 NOTE — Progress Notes (Signed)
PROVIDER NOTE: Information contained herein reflects review and annotations entered in association with encounter. Interpretation of such information and data should be left to medically-trained personnel. Information provided to patient can be located elsewhere in the medical record under "Patient Instructions". Document created using STT-dictation technology, any transcriptional errors that may result from process are unintentional.    Patient: Jaime Johnson  Service Category: Procedure  Provider: Gillis Santa, MD  DOB: [birth year]  DOS: 01/25/2020  Location: [facility]  MRN: [id number]  Setting: Ambulatory - outpatient  Referring Provider: Owens Loffler, MD  Type: Established Patient  Specialty: Interventional Pain Management  PCP: Owens Loffler, MD   Primary Reason for Visit: Interventional Pain Management Treatment. CC: Neck Pain (bilateral )  Procedure:          Anesthesia, Analgesia, Anxiolysis:  Type: Diagnostic, Inter-Laminar, Cervical Epidural Steroid Injection  #1  Region: Posterior Cervico-thoracic Region Level: C7-T1 Laterality: Midline Paramedial  Type: Moderate (Conscious) Sedation combined with Local Anesthesia Indication(s): Analgesia and Anxiety Route: Intravenous (IV) IV Access: Secured Sedation: Meaningful verbal contact was maintained at all times during the procedure  Local Anesthetic: Lidocaine 1-2%  Position: Prone with head of the table was raised to facilitate breathing.   Indications: 1. 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 Cervical radiculopathy    Pain Score: Pre-procedure: 5 /10 Post-procedure: 2 /10   Pre-op Assessment:  Jaime Johnson is a 59 y.o. (year old), female patient, seen today for interventional treatment. She  has a past surgical history that includes Cesarean section; Colonoscopy with propofol (N/A, 11/12/2016); polypectomy (11/12/2016); Colonoscopy with  propofol (N/A, 03/29/2017); and polypectomy (03/29/2017). Jaime Johnson has a current medication list which includes the following prescription(s): lisinopril-hydrochlorothiazide, lumify, pregabalin, systane complete, tramadol, and ferrous sulfate, and the following Facility-Administered Medications: fentanyl. Her primarily concern today is the Neck Pain (bilateral )  Initial Vital Signs:  Pulse/HCG Rate: 66ECG Heart Rate: 68 Temp: 97.7 F (36.5 C) Resp: 16 BP: (!) 166/81 SpO2: 100 %  BMI: Estimated body mass index is 28.53 kg/m as calculated from the following:   Height as of this encounter: 5\' 2"  (1.575 m).   Weight as of this encounter: 156 lb (70.8 kg).  Risk Assessment: Allergies: Reviewed. She is allergic to azithromycin.  Allergy Precautions: None required Coagulopathies: Reviewed. None identified.  Blood-thinner therapy: None at this time Active Infection(s): Reviewed. None identified. Jaime Johnson is afebrile  Site Confirmation: Jaime Johnson was asked to confirm the procedure and laterality before marking the site Procedure checklist: Completed Consent: Before the procedure and under the influence of no sedative(s), amnesic(s), or anxiolytics, the patient was informed of the treatment options, risks and possible complications. To fulfill our ethical and legal obligations, as recommended by the American Medical Association's Code of Ethics, I have informed the patient of my clinical impression; the nature and purpose of the treatment or procedure; the risks, benefits, and possible complications of the intervention; the alternatives, including doing nothing; the risk(s) and benefit(s) of the alternative treatment(s) or procedure(s); and the risk(s) and benefit(s) of doing nothing. The patient was provided information about the general risks and possible complications associated with the procedure. These may include, but are not limited to: failure to achieve desired goals, infection,  bleeding, organ or nerve damage, allergic reactions, paralysis, and death. In addition, the patient was informed of those risks and complications associated to Spine-related procedures, such as failure to decrease pain;  infection (i.e.: Meningitis, epidural or intraspinal abscess); bleeding (i.e.: epidural hematoma, subarachnoid hemorrhage, or any other type of intraspinal or peri-dural bleeding); organ or nerve damage (i.e.: Any type of peripheral nerve, nerve root, or spinal cord injury) with subsequent damage to sensory, motor, and/or autonomic systems, resulting in permanent pain, numbness, and/or weakness of one or several areas of the body; allergic reactions; (i.e.: anaphylactic reaction); and/or death. Furthermore, the patient was informed of those risks and complications associated with the medications. These include, but are not limited to: allergic reactions (i.e.: anaphylactic or anaphylactoid reaction(s)); adrenal axis suppression; blood sugar elevation that in diabetics may result in ketoacidosis or comma; water retention that in patients with history of congestive heart failure may result in shortness of breath, pulmonary edema, and decompensation with resultant heart failure; weight gain; swelling or edema; medication-induced neural toxicity; particulate matter embolism and blood vessel occlusion with resultant organ, and/or nervous system infarction; and/or aseptic necrosis of one or more joints. Finally, the patient was informed that Medicine is not an exact science; therefore, there is also the possibility of unforeseen or unpredictable risks and/or possible complications that may result in a catastrophic outcome. The patient indicated having understood very clearly. We have given the patient no guarantees and we have made no promises. Enough time was given to the patient to ask questions, all of which were answered to the patient's satisfaction. Jaime Johnson has indicated that she wanted to  continue with the procedure. Attestation: I, the ordering provider, attest that I have discussed with the patient the benefits, risks, side-effects, alternatives, likelihood of achieving goals, and potential problems during recovery for the procedure that I have provided informed consent. Date  Time: 01/25/2020  8:37 AM  Pre-Procedure Preparation:  Monitoring: As per clinic protocol. Respiration, ETCO2, SpO2, BP, heart rate and rhythm monitor placed and checked for adequate function Safety Precautions: Patient was assessed for positional comfort and pressure points before starting the procedure. Time-out: I initiated and conducted the "Time-out" before starting the procedure, as per protocol. The patient was asked to participate by confirming the accuracy of the "Time Out" information. Verification of the correct person, site, and procedure were performed and confirmed by me, the nursing staff, and the patient. "Time-out" conducted as per Joint Commission's Universal Protocol (UP.01.01.01). Time: 0916  Description of Procedure:          Target Area: For Epidural Steroid injections the target is the interlaminar space, initially targeting the lower border of the superior vertebral body lamina. Approach: Paramedial approach. Area Prepped: Entire PosteriorCervical Region DuraPrep (Iodine Povacrylex [0.7% available iodine] and Isopropyl Alcohol, 74% w/w) Safety Precautions: Aspiration looking for blood return was conducted prior to all injections. At no point did we inject any substances, as a needle was being advanced. No attempts were made at seeking any paresthesias. Safe injection practices and needle disposal techniques used. Medications properly checked for expiration dates. SDV (single dose vial) medications used. Description of the Procedure: Protocol guidelines were followed. The procedure needle was introduced through the skin, ipsilateral to the reported pain, and advanced to the target area.  Bone was contacted and the needle walked caudad, until the lamina was cleared. The epidural space was identified using "loss-of-resistance technique" with 2-3 ml of PF-NaCl (0.9% NSS), in a 5cc LOR glass syringe. Vitals:   01/25/20 0843 01/25/20 0914 01/25/20 0919 01/25/20 0923  BP: (!) 166/81 (!) 179/86 (!) 184/84 (!) 187/84  Pulse: 66     Resp: 16 16 18  18  Temp: 97.7 F (36.5 C)     TempSrc: Temporal     SpO2: 100% 100% 99% 100%  Weight: 156 lb (70.8 kg)     Height: 5\' 2"  (1.575 m)       Start Time: 0916 hrs. End Time: 0923 hrs. Materials:  Needle(s) Type: Epidural needle Gauge: 22G Length: 3.5-in Medication(s): Please see orders for medications and dosing details. 4 cc solution made of 2 cc of preservative-free saline, 1 cc of 0.2% ropivacaine, 1 cc of Decadron 10 mg/cc.  Imaging Guidance (Spinal):          Type of Imaging Technique: Fluoroscopy Guidance (Spinal) Indication(s): Assistance in needle guidance and placement for procedures requiring needle placement in or near specific anatomical locations not easily accessible without such assistance. Exposure Time: Please see nurses notes. Contrast: Before injecting any contrast, we confirmed that the patient did not have an allergy to iodine, shellfish, or radiological contrast. Once satisfactory needle placement was completed at the desired level, radiological contrast was injected. Contrast injected under live fluoroscopy. No contrast complications. See chart for type and volume of contrast used. Fluoroscopic Guidance: I was personally present during the use of fluoroscopy. "Tunnel Vision Technique" used to obtain the best possible view of the target area. Parallax error corrected before commencing the procedure. "Direction-depth-direction" technique used to introduce the needle under continuous pulsed fluoroscopy. Once target was reached, antero-posterior, oblique, and lateral fluoroscopic projection used confirm needle placement in  all planes. Images permanently stored in EMR. Interpretation: I personally interpreted the imaging intraoperatively. Adequate needle placement confirmed in multiple planes. Appropriate spread of contrast into desired area was observed. No evidence of afferent or efferent intravascular uptake. No intrathecal or subarachnoid spread observed. Permanent images saved into the patient's record.  Antibiotic Prophylaxis:   Anti-infectives (From admission, onward)   None     Indication(s): None identified  Post-operative Assessment:  Post-procedure Vital Signs:  Pulse/HCG Rate: 6674 Temp: 97.7 F (36.5 C) Resp: 18 BP: (!) 187/84 SpO2: 100 %  EBL: None  Complications: No immediate post-treatment complications observed by team, or reported by patient.  Note: The patient tolerated the entire procedure well. A repeat set of vitals were taken after the procedure and the patient was kept under observation following institutional policy, for this type of procedure. Post-procedural neurological assessment was performed, showing return to baseline, prior to discharge. The patient was provided with post-procedure discharge instructions, including a section on how to identify potential problems. Should any problems arise concerning this procedure, the patient was given instructions to immediately contact us, at any time, without hesitation. In any case, we plan to contact the patient by telephone for a follow-up status report regarding this interventional procedure.  Comments:  No additional relevant information.  5 out of 5 strength bilateral upper extremity: Shoulder abduction, elbow flexion, elbow extension, thumb extension.  Plan of Care  Orders:  Orders Placed This Encounter  Procedures  . DG PAIN CLIN[facility] C-ARM 1-60 MIN NO REPORT    Intraoperative interpretation by procedural physician at [facility].    Standing Status:   Standing    Number of Occurrences:   1    Order Specific  Question:   Reason for exam:    Answer:   Assistance in needle guidance and placement for procedures requiring needle placement in or near specific anatomical locations not easily accessible without such assistance.    Medications ordered for procedure: Meds ordered this encounter  Medications  . iohexol (OMNIPAQUE) 180 MG/ML  injection 10 mL    Must be Myelogram-compatible. If not available, you may substitute with a water-soluble, non-ionic, hypoallergenic, myelogram-compatible radiological contrast medium.  Marland Kitchen lidocaine (XYLOCAINE) 2 % (with pres) injection 400 mg  . fentaNYL (SUBLIMAZE) injection 25-50 mcg    Make sure Narcan is available in the pyxis when using this medication. In the event of respiratory depression (RR< 8/min): Titrate NARCAN (naloxone) in increments of 0.1 to 0.2 mg IV at 2-3 minute intervals, until desired degree of reversal.  . sodium chloride flush (NS) 0.9 % injection 1 mL  . ropivacaine (PF) 2 mg/mL (0.2%) (NAROPIN) injection 1 mL  . dexamethasone (DECADRON) injection 10 mg   Medications administered: We administered iohexol, lidocaine, fentaNYL, sodium chloride flush, ropivacaine (PF) 2 mg/mL (0.2%), and dexamethasone.  See the medical record for exact dosing, route, and time of administration.  Follow-up plan:   Return in about 4 weeks (around 02/22/2020) for Post Procedure Evaluation, virtual.     Recent Visits Date Type Provider Dept  12/17/19 Office Visit Gillis Santa, MD [facility]  11/24/19 Office Visit Gillis Santa, MD [facility]  Showing recent visits within past 90 days and meeting all other requirements   Today's Visits Date Type Provider Dept  01/25/20 Procedure visit Gillis Santa, MD [facility]  Showing today's visits and meeting all other requirements   Future Appointments No visits were found meeting these conditions.  Showing future appointments within next 90 days and meeting all other requirements    Disposition: Discharge home  Discharge (Date  Time): 01/25/2020;   hrs.   Primary Care Physician: Owens Loffler, MD Location: [facility] Note by: Gillis Santa, MD Date: 01/25/2020; Time: 9:28 AM  Disclaimer:  Medicine is not an exact science. The only guarantee in medicine is that nothing is guaranteed. It is important to note that the decision to proceed with this intervention was based on the information collected from the patient. The Data and conclusions were drawn from the patient's questionnaire, the interview, and the physical examination. Because the information was provided in large part by the patient, it cannot be guaranteed that it has not been purposely or unconsciously manipulated. Every effort has been made to obtain as much relevant data as possible for this evaluation. It is important to note that the conclusions that lead to this procedure are derived in large part from the available data. Always take into account that the treatment will also be dependent on availability of resources and existing treatment guidelines, considered by other Pain Management Practitioners as being common knowledge and practice, at the time of the intervention. For Medico-Legal purposes, it is also important to point out that variation in procedural techniques and pharmacological choices are the acceptable norm. The indications, contraindications, technique, and results of the above procedure should only be interpreted and judged by a Board-Certified Interventional Pain Specialist with extensive familiarity and expertise in the same exact procedure and technique.

## 2020-01-26 ENCOUNTER — Telehealth: Payer: Self-pay | Admitting: Student in an Organized Health Care Education/Training Program

## 2020-01-26 ENCOUNTER — Telehealth: Payer: Self-pay | Admitting: *Deleted

## 2020-01-26 MED ORDER — TIZANIDINE HCL 4 MG PO TABS: 2.0000 mg | ORAL_TABLET | Freq: Two times a day (BID) | ORAL | 1 refills | Status: DC | PRN

## 2020-01-26 NOTE — Telephone Encounter (Signed)
Dr. Holley Raring,                 Did you discuss a muscle relaxer with her? Please advise and I will call her back.

## 2020-01-26 NOTE — Telephone Encounter (Signed)
Called for post procedure follow up . No answer. LVM.

## 2020-01-26 NOTE — Telephone Encounter (Signed)
Patient returned Nurse call PP, she is doing well, she would like to check on getting a relaxer medication as this really helped her when she was here yesterday. Please call to discuss.

## 2020-01-26 NOTE — Telephone Encounter (Signed)
Patient called and LVM about her medication being called in.

## 2020-02-02 ENCOUNTER — Ambulatory Visit (INDEPENDENT_AMBULATORY_CARE_PROVIDER_SITE_OTHER): Payer: Self-pay | Admitting: Gastroenterology

## 2020-02-02 ENCOUNTER — Other Ambulatory Visit: Payer: Self-pay

## 2020-02-02 DIAGNOSIS — Z1211 Encounter for screening for malignant neoplasm of colon: Secondary | ICD-10-CM

## 2020-02-02 DIAGNOSIS — Z86010 Personal history of colonic polyps: Secondary | ICD-10-CM

## 2020-02-02 NOTE — Progress Notes (Signed)
Gastroenterology Pre-Procedure Review  Request Date: 03/14/20 Requesting Physician: Dr. Allen Norris  PATIENT REVIEW QUESTIONS: The patient responded to the following health history questions as indicated:    1. Are you having any GI issues? yes (change in bowel habits harder to eliminate stool.  Has to stay in bathroom longer) 2. Do you have a personal history of Polyps? yes (2018 polyps noted) 3. Do you have a family history of Colon Cancer or Polyps? no 4. Diabetes Mellitus? no 5. Joint replacements in the past 12 months?no 6. Major health problems in the past 3 months?no 7. Any artificial heart valves, MVP, or defibrillator?no    MEDICATIONS & ALLERGIES:    Patient reports the following regarding taking any anticoagulation/antiplatelet therapy:   Plavix, Coumadin, Eliquis, Xarelto, Lovenox, Pradaxa, Brilinta, or Effient? no Aspirin? no  Patient confirms/reports the following medications:  Current Outpatient Medications  Medication Sig Dispense Refill  . IRON PO Take by mouth.    Marland Kitchen lisinopril-hydrochlorothiazide (ZESTORETIC) 10-12.5 MG tablet Take 1 tablet by mouth daily. 90 tablet 0  . LUMIFY 0.025 % SOLN Apply 1 drop to eye as needed.     . pregabalin (LYRICA) 50 MG capsule Take 1 capsule (50 mg total) by mouth at bedtime for 20 days, THEN 1 capsule (50 mg total) 2 (two) times daily. 100 capsule 0  . SYSTANE COMPLETE 0.6 % SOLN     . tiZANidine (ZANAFLEX) 4 MG tablet Take 0.5-1 tablets (2-4 mg total) by mouth 2 (two) times daily as needed for muscle spasms. 60 tablet 1  . traMADol (ULTRAM) 50 MG tablet Take 1 tablet (50 mg total) by mouth daily. 30 tablet 0   No current facility-administered medications for this visit.    Patient confirms/reports the following allergies:  Allergies  Allergen Reactions  . Azithromycin Other (See Comments)    Gets a bad yeast infection from this.    No orders of the defined types were placed in this encounter.   AUTHORIZATION  INFORMATION Primary Insurance: 1D#: Group #:  Secondary Insurance: 1D#: Group #:  SCHEDULE INFORMATION: Date: Monday 03/14/20 Time: Location:[facility]

## 2020-02-08 ENCOUNTER — Other Ambulatory Visit: Payer: Self-pay

## 2020-02-08 ENCOUNTER — Other Ambulatory Visit (INDEPENDENT_AMBULATORY_CARE_PROVIDER_SITE_OTHER): Payer: Self-pay

## 2020-02-08 ENCOUNTER — Encounter: Payer: Self-pay | Admitting: Student in an Organized Health Care Education/Training Program

## 2020-02-08 DIAGNOSIS — Z0000 Encounter for general adult medical examination without abnormal findings: Secondary | ICD-10-CM

## 2020-02-08 DIAGNOSIS — Z131 Encounter for screening for diabetes mellitus: Secondary | ICD-10-CM

## 2020-02-08 LAB — BASIC METABOLIC PANEL
BUN: 10 mg/dL (ref 6–23)
CO2: 25 mEq/L (ref 19–32)
Calcium: 9.6 mg/dL (ref 8.4–10.5)
Chloride: 104 mEq/L (ref 96–112)
Creatinine, Ser: 0.76 mg/dL (ref 0.40–1.20)
GFR: 77.67 mL/min (ref >60.00)
Glucose, Bld: 95 mg/dL (ref 70–99)
Potassium: 3.8 mEq/L (ref 3.5–5.1)
Sodium: 140 mEq/L (ref 135–145)

## 2020-02-08 LAB — CBC WITH DIFFERENTIAL/PLATELET
Basophils Absolute: 0.1 10*3/uL (ref 0.0–0.1)
Basophils Relative: 1.2 % (ref 0.0–3.0)
Eosinophils Absolute: 0.1 10*3/uL (ref 0.0–0.7)
Eosinophils Relative: 1.7 % (ref 0.0–5.0)
HCT: 45.8 % (ref 36.0–46.0)
Hemoglobin: 15.6 g/dL — ABNORMAL HIGH (ref 12.0–15.0)
Lymphocytes Relative: 34.9 % (ref 12.0–46.0)
Lymphs Abs: 1.6 10*3/uL (ref 0.7–4.0)
MCHC: 34 g/dL (ref 30.0–36.0)
MCV: 83.6 fl (ref 78.0–100.0)
Monocytes Absolute: 0.2 10*3/uL (ref 0.1–1.0)
Monocytes Relative: 5.2 % (ref 3.0–12.0)
Neutro Abs: 2.7 10*3/uL (ref 1.4–7.7)
Neutrophils Relative %: 57 % (ref 43.0–77.0)
Platelets: 237 10*3/uL (ref 150.0–400.0)
RBC: 5.48 Mil/uL — ABNORMAL HIGH (ref 3.87–5.11)
RDW: 13.1 % (ref 11.5–15.5)
WBC: 4.7 10*3/uL (ref 4.0–10.5)

## 2020-02-08 LAB — HEPATIC FUNCTION PANEL
ALT: 42 U/L — ABNORMAL HIGH (ref 0–35)
AST: 29 U/L (ref 0–37)
Albumin: 4.7 g/dL (ref 3.5–5.2)
Alkaline Phosphatase: 57 U/L (ref 39–117)
Bilirubin, Direct: 0.1 mg/dL (ref 0.0–0.3)
Total Bilirubin: 0.8 mg/dL (ref 0.2–1.2)
Total Protein: 7.5 g/dL (ref 6.0–8.3)

## 2020-02-08 LAB — TSH: TSH: 4.19 u[IU]/mL (ref 0.35–4.50)

## 2020-02-08 LAB — LIPID PANEL
Cholesterol: 287 mg/dL — ABNORMAL HIGH (ref 0–200)
HDL: 61.2 mg/dL (ref >39.00)
LDL Cholesterol: 188 mg/dL — ABNORMAL HIGH (ref 0–99)
NonHDL: 226.09
Total CHOL/HDL Ratio: 5
Triglycerides: 188 mg/dL — ABNORMAL HIGH (ref 0.0–149.0)
VLDL: 37.6 mg/dL (ref 0.0–40.0)

## 2020-02-08 LAB — HEMOGLOBIN A1C: Hgb A1c MFr Bld: 5.2 % (ref 4.6–6.5)

## 2020-02-15 ENCOUNTER — Other Ambulatory Visit: Payer: Self-pay | Admitting: *Deleted

## 2020-02-15 ENCOUNTER — Other Ambulatory Visit: Payer: Self-pay

## 2020-02-15 ENCOUNTER — Encounter: Payer: Self-pay | Admitting: Family Medicine

## 2020-02-15 ENCOUNTER — Ambulatory Visit: Payer: Self-pay | Admitting: Family Medicine

## 2020-02-15 VITALS — BP 140/86 | HR 72 | Temp 98.0°F | Ht 62.0 in | Wt 154.2 lb

## 2020-02-15 DIAGNOSIS — E785 Hyperlipidemia, unspecified: Secondary | ICD-10-CM

## 2020-02-15 DIAGNOSIS — G8929 Other chronic pain: Secondary | ICD-10-CM

## 2020-02-15 DIAGNOSIS — M549 Dorsalgia, unspecified: Secondary | ICD-10-CM

## 2020-02-15 DIAGNOSIS — Z0000 Encounter for general adult medical examination without abnormal findings: Secondary | ICD-10-CM

## 2020-02-15 MED ORDER — TRAMADOL HCL 50 MG PO TABS: 50.0000 mg | ORAL_TABLET | Freq: Every day | ORAL | 2 refills | Status: DC

## 2020-02-15 MED ORDER — LISINOPRIL-HYDROCHLOROTHIAZIDE 10-12.5 MG PO TABS: 1.0000 | ORAL_TABLET | Freq: Every day | ORAL | 3 refills | Status: DC

## 2020-02-15 NOTE — Patient Instructions (Signed)
OSTEOARTHRITIS:  For symptomatic relief:  Tylenol: 2 tablets up to 3-4 times a day Regular NSAIDS are helpful (avoid in kidney disease and ulcers)  For example: Alleve 2 tabs by mouth two times a day over the counter: Take at least for 2 - 3 weeks. This is equal to a prescripton strength dose (GENERIC CHEAPER EQUIVALENT IS NAPROXEN SODIUM)   Over the counter: Topical Voltaren (generic) 1% gel: can take up to 4 times a day  - costs 9 dollars a tube  Topical Capzaicin Cream, as needed (wear glove to put on) - THIS IS EXCEPTIONALLY HOT Supplements: Tart cherry juice and Curcumin (Turmeric extract) have good scientific evidence  Glucosamine and Chondroitin often helpful - will take about 3 months to see if you have an effect. If you do, great, keep them up, if none at that point, no need to take in the future.  Omega-3 fish oils may help, 2 grams daily  Ice joints on bad days, 20 min, 2-3 x / day REGULAR EXERCISE: swimming, Yoga, Tai Chi, bicycle (NON-IMPACT activity)   Weight loss will always take stress off of the joints and back

## 2020-02-15 NOTE — Telephone Encounter (Signed)
Tramadol called into [facility], [place]

## 2020-02-15 NOTE — Progress Notes (Signed)
Jaime Johnson T. Jaime Dye, MD, [place] at [facility] [place], [address]  Phone: [phone]  FAX: [place] - 59 y.o. female  MRN [id number]  Date of Birth: [birth year]  Date: 02/15/2020  PCP: Owens Loffler, MD  Referral: Owens Loffler, MD  Chief Complaint  Patient presents with  . Annual Exam    This visit occurred during the SARS-CoV-2 public health emergency.  Safety protocols were in place, including screening questions prior to the visit, additional usage of staff PPE, and extensive cleaning of exam room while observing appropriate contact time as indicated for disinfecting solutions.   Patient Care Team: Owens Loffler, MD as PCP - General (Family Medicine) Rico Junker, RN as Registered Nurse Theodore Demark, RN as Registered Nurse Subjective:   Jaime Johnson is a 59 y.o. pleasant patient who presents with the following:  Health Maintenance Summary Reviewed and updated, unless pt declines services.  Tobacco History Reviewed. 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   Pain is back all over And CAD reviewed.  Tylenol med doe help for a little while Every night will take some tramadol Will keep awake  Did eat some keto diet -     Health Maintenance  Topic Date Due  . HIV Screening  Never done  . PAP SMEAR-Modifier  10/07/2015  . MAMMOGRAM  01/17/2019  . COLONOSCOPY  03/29/2020  . INFLUENZA VACCINE  05/22/2020  . TETANUS/TDAP  07/03/2023  . COVID-19 Vaccine  Completed  . Hepatitis C Screening  Completed    Immunization History  Administered Date(s) Administered  . Influenza Inj Mdck Quad Pf  09/24/2019  . Influenza, Seasonal, Injecte, Preservative Fre 07/24/2016  . Moderna SARS-COVID-2 Vaccination 12/05/2019, 12/26/2019  . Pneumococcal Polysaccharide-23 09/21/2018  . Tdap 07/02/2013  . Zoster Recombinat (Shingrix) 06/23/2018, 09/15/2018   Patient Active Problem List   Diagnosis Date Noted  . Hyperlipidemia LDL goal <100 02/16/2020  . Cervical radicular pain (RIGHT) 09/29/2019  . Bilateral carpal tunnel syndrome 09/29/2019  . Generalized osteoarthritis 05/04/2019  . Hypertension 12/19/2016  . Polyp of sigmoid colon   . Allergic rhinitis due to pollen     Past Medical History:  Diagnosis Date  . Allergic rhinitis due to pollen   . Anemia   . Arthritis    Hands, knees, shoulder  . Hyperlipidemia LDL goal <100 02/16/2020  . Hypertension 12/19/2016  . Motion sickness    cars    Past Surgical History:  Procedure Laterality Date  . CESAREAN SECTION    . COLONOSCOPY WITH PROPOFOL N/A 11/12/2016   Procedure: COLONOSCOPY WITH PROPOFOL;  Surgeon: Lucilla Lame, MD;  Location: [place];  Service: Endoscopy;  Laterality: N/A;  . COLONOSCOPY WITH PROPOFOL N/A 03/29/2017   Procedure: COLONOSCOPY WITH PROPOFOL;  Surgeon: Lucilla Lame, MD;  Location: [place];  Service: Endoscopy;  Laterality: N/A;  . POLYPECTOMY  11/12/2016   Procedure: POLYPECTOMY;  Surgeon: Lucilla Lame, MD;  Location: [place];  Service: Endoscopy;;  . POLYPECTOMY  03/29/2017   Procedure: POLYPECTOMY;  Surgeon: Lucilla Lame, MD;  Location: [place];  Service: Endoscopy;;    Family History  Problem Relation Age of Onset  . Hepatitis C Mother 43       d/c at 46  . Hepatitis C Brother   . Hypertension Father     Past Medical History, Surgical History, Social History, Family History, Problem List, Medications, and Allergies have been reviewed and updated if relevant.  Review of Systems: Pertinent positives are listed above.  Otherwise, a full 14 point review of systems  has been done in full and it is negative except where it is noted positive.  Objective:   BP 140/86   Pulse 72   Temp 98 F (36.7 C) (Temporal)   Ht 5\' 2"  (1.575 m)   Wt 154 lb 4 oz (70 kg)   LMP 11/01/2016 (Exact Date) Comment: preg test neg  SpO2 99%   BMI 28.21 kg/m  Ideal Body Weight: Weight in (lb) to have BMI = 25: 136.4 No exam data present Depression screen [facility] 2/9 09/28/2019 11/10/2018  Decreased Interest 0 0  Down, Depressed, Hopeless 0 0  PHQ - 2 Score 0 0     GEN: well developed, well nourished, no acute distress Eyes: conjunctiva and lids normal, PERRLA, EOMI ENT: TM clear, nares clear, oral exam WNL Neck: supple, no lymphadenopathy, no thyromegaly, no JVD Pulm: clear to auscultation and percussion, respiratory effort normal CV: regular rate and rhythm, S1-S2, no murmur, rub or gallop, no bruits Chest: no scars, masses, no lumps BREAST: breast exam declined GI: soft, non-tender; no hepatosplenomegaly, masses; active bowel sounds all quadrants GU: GU exam declined Lymph: no cervical, axillary or inguinal adenopathy MSK: gait normal, muscle tone and strength WNL, no joint swelling, effusions, discoloration, crepitus  SKIN: clear, good turgor, color WNL, no rashes, lesions, or ulcerations Neuro: normal mental status, normal strength, sensation, and motion Psych: alert; oriented to person, place and time, normally interactive and not anxious or depressed in appearance.   All labs reviewed with patient. Results for orders placed or performed in visit on 02/08/20  Lipid panel  Result Value Ref Range   Cholesterol 287 (H) 0 - 200 mg/dL   Triglycerides 188.0 (H) 0.0 - 149.0 mg/dL   HDL 61.20 >39.00 mg/dL   VLDL 37.6 0.0 - 40.0 mg/dL   LDL Cholesterol 188 (H) 0 - 99 mg/dL   Total CHOL/HDL Ratio 5    NonHDL 226.09   Hepatic function panel  Result Value Ref Range   Total Bilirubin 0.8 0.2 - 1.2 mg/dL   Bilirubin, Direct 0.1 0.0 - 0.3 mg/dL   Alkaline Phosphatase  57 39 - 117 U/L   AST 29 0 - 37 U/L   ALT 42 (H) 0 - 35 U/L   Total Protein 7.5 6.0 - 8.3 g/dL   Albumin 4.7 3.5 - 5.2 g/dL  Basic metabolic panel  Result Value Ref Range   Sodium 140 135 - 145 mEq/L   Potassium 3.8 3.5 - 5.1 mEq/L   Chloride 104 96 - 112 mEq/L   CO2 25 19 - 32 mEq/L   Glucose, Bld 95 70 - 99 mg/dL   BUN 10 6 - 23 mg/dL   Creatinine, Ser 0.76 0.40 - 1.20 mg/dL   GFR 77.67 >60.00 mL/min   Calcium 9.6 8.4 - 10.5 mg/dL  CBC with Differential/Platelet  Result Value Ref Range   WBC 4.7 4.0 - 10.5 K/uL   RBC 5.48 (H) 3.87 - 5.11 Mil/uL   Hemoglobin 15.6 (H) 12.0 - 15.0 g/dL   HCT 45.8 36.0 - 46.0 %   MCV  83.6 78.0 - 100.0 fl   MCHC 34.0 30.0 - 36.0 g/dL   RDW 13.1 11.5 - 15.5 %   Platelets 237.0 150.0 - 400.0 K/uL   Neutrophils Relative % 57.0 43.0 - 77.0 %   Lymphocytes Relative 34.9 12.0 - 46.0 %   Monocytes Relative 5.2 3.0 - 12.0 %   Eosinophils Relative 1.7 0.0 - 5.0 %   Basophils Relative 1.2 0.0 - 3.0 %   Neutro Abs 2.7 1.4 - 7.7 K/uL   Lymphs Abs 1.6 0.7 - 4.0 K/uL   Monocytes Absolute 0.2 0.1 - 1.0 K/uL   Eosinophils Absolute 0.1 0.0 - 0.7 K/uL   Basophils Absolute 0.1 0.0 - 0.1 K/uL  TSH  Result Value Ref Range   TSH 4.19 0.35 - 4.50 uIU/mL  Hemoglobin A1c  Result Value Ref Range   Hgb A1c MFr Bld 5.2 4.6 - 6.5 %   DG PAIN CLINIC C-ARM 1-60 MIN NO REPORT  Result Date: 01/25/2020 Fluoro was used, but no Radiologist interpretation will be provided. Please refer to "NOTES" tab for provider progress note.   Assessment and Plan:     ICD-10-CM   1. Healthcare maintenance  Z00.00   2. Chronic back pain greater than 3 months duration  M54.9    G89.29   3. Hyperlipidemia LDL goal <100  E78.5    She is still having a lot of problems with diffuse osteoarthritis as well as ongoing back pain.  I did refill her tramadol.  She is also going to see pain management, and possibly have a repeat epidural steroid injection.  Health Maintenance Exam: The  patient's preventative maintenance and recommended screening tests for an annual wellness exam were reviewed in full today. Brought up to date unless services declined.  Counselled on the importance of diet, exercise, and its role in overall health and mortality. The patient's FH and SH was reviewed, including their home life, tobacco status, and drug and alcohol status.  Follow-up in 1 year for physical exam or additional follow-up below.  Follow-up: No follow-ups on file. Or follow-up in 1 year if not noted.  Future Appointments  Date Time Provider [place]  02/22/2020  2:15 PM Gillis Santa, MD [facility] None    Meds ordered this encounter  Medications  . lisinopril-hydrochlorothiazide (ZESTORETIC) 10-12.5 MG tablet    Sig: Take 1 tablet by mouth daily.    Dispense:  90 tablet    Refill:  3   Medications Discontinued During This Encounter  Medication Reason  . pregabalin (LYRICA) 50 MG capsule Side effect (s)  . tiZANidine (ZANAFLEX) 4 MG tablet Completed Course  . IRON PO Completed Course  . lisinopril-hydrochlorothiazide (ZESTORETIC) 10-12.5 MG tablet Reorder   No orders of the defined types were placed in this encounter.   Signed,  Maud Deed. Garnette Greb, MD   Allergies as of 02/15/2020      Reactions   Azithromycin Other (See Comments)   Gets a bad yeast infection from this.      Medication List       Accurate as of February 15, 2020 11:59 PM. If you have any questions, ask your nurse or doctor.        STOP taking these medications   IRON PO Stopped by: Owens Loffler, MD   pregabalin 50 MG capsule Commonly known as: Lyrica Stopped by: Owens Loffler, MD   tiZANidine 4 MG tablet Commonly known as: Zanaflex Stopped by: Owens Loffler, MD     TAKE these medications  lisinopril-hydrochlorothiazide 10-12.5 MG tablet Commonly known as: ZESTORETIC Take 1 tablet by mouth daily.   Lumify 0.025 % Soln Generic drug: Brimonidine Tartrate Apply 1  drop to eye as needed.   Systane Complete 0.6 % Soln Generic drug: Propylene Glycol   traMADol 50 MG tablet Commonly known as: ULTRAM Take 1 tablet (50 mg total) by mouth daily.

## 2020-02-16 ENCOUNTER — Encounter: Payer: Self-pay | Admitting: Family Medicine

## 2020-02-16 DIAGNOSIS — E785 Hyperlipidemia, unspecified: Secondary | ICD-10-CM

## 2020-02-16 HISTORY — DX: Hyperlipidemia, unspecified: E78.5

## 2020-02-18 ENCOUNTER — Telehealth: Payer: Self-pay

## 2020-02-18 ENCOUNTER — Encounter: Payer: Self-pay | Admitting: Student in an Organized Health Care Education/Training Program

## 2020-02-18 NOTE — Telephone Encounter (Signed)
LM for patient to call office

## 2020-02-19 ENCOUNTER — Encounter: Payer: Self-pay | Admitting: Student in an Organized Health Care Education/Training Program

## 2020-02-22 ENCOUNTER — Encounter: Payer: Self-pay | Admitting: Student in an Organized Health Care Education/Training Program

## 2020-02-22 ENCOUNTER — Other Ambulatory Visit: Payer: Self-pay

## 2020-02-22 ENCOUNTER — Ambulatory Visit: Payer: Self-pay | Admitting: Pain Medicine

## 2020-02-22 ENCOUNTER — Ambulatory Visit
Payer: Self-pay | Attending: Student in an Organized Health Care Education/Training Program | Admitting: Student in an Organized Health Care Education/Training Program

## 2020-02-22 DIAGNOSIS — M5412 Radiculopathy, cervical region: Secondary | ICD-10-CM

## 2020-02-22 DIAGNOSIS — M4802 Spinal stenosis, cervical region: Secondary | ICD-10-CM

## 2020-02-22 NOTE — Progress Notes (Signed)
Patient: Jaime Johnson  Service Category: E/M  Provider: Gillis Santa, MD  DOB: Jan 20, 1961  DOS: 02/22/2020  Location: Office  MRN: [id number]  Setting: Ambulatory outpatient  Referring Provider: Owens Loffler, MD  Type: Established Patient  Specialty: Interventional Pain Management  PCP: Owens Loffler, MD  Location: Home  Delivery: TeleHealth     Virtual Encounter - Pain Management PROVIDER NOTE: Information contained herein reflects review and annotations entered in association with encounter. Interpretation of such information and data should be left to medically-trained personnel. Information provided to patient can be located elsewhere in the medical record under "Patient Instructions". Document created using STT-dictation technology, any transcriptional errors that may result from process are unintentional.    Contact & Pharmacy Preferred: [phone] Home: [phone] (home) Mobile: [phone] (mobile) E-mail: [email or web address] .[name] [address], [address] [place] [address] Phone: [phone] Fax: [phone]   Pre-screening  Ms. Corley offered "in-person" vs "virtual" encounter. She indicated preferring virtual for this encounter.   Reason COVID-19*  Social distancing based on CDC and AMA recommendations.   I contacted [name] on 02/22/2020 via video conference.      I clearly identified myself as Gillis Santa, MD. I verified that I was speaking with the correct person using two identifiers (Name: JEWELDEAN DROHAN, and date of birth: August 12, 1961).  Consent I sought verbal advanced consent from [name] for virtual visit interactions. I informed Ms. Canby of possible security and privacy concerns, risks, and limitations associated with providing "not-in-person" medical evaluation and management services. I also informed Ms. Motsinger of the availability of "in-person" appointments.  Finally, I informed her that there would be a charge for the virtual visit and that she could be  personally, fully or partially, financially responsible for it. Ms. Austin expressed understanding and agreed to proceed.   Historic Elements   Ms. LUCIANNA OSTLUND is a 59 y.o. year old, female patient evaluated today after her last contact with our practice on 02/18/2020. Ms. Schlesinger  has a past medical history of Allergic rhinitis due to pollen, Anemia, Arthritis, Hyperlipidemia LDL goal <100 (02/16/2020), Hypertension (12/19/2016), and Motion sickness. She also  has a past surgical history that includes Cesarean section; Colonoscopy with propofol (N/A, 11/12/2016); polypectomy (11/12/2016); Colonoscopy with propofol (N/A, 03/29/2017); and polypectomy (03/29/2017). Ms. Bazile has a current medication list which includes the following prescription(s): lisinopril-hydrochlorothiazide, lumify, systane complete, and tramadol. She  reports that she quit smoking about 41 years ago. She has never used smokeless tobacco. She reports that she does not drink alcohol or use drugs. Ms. Polinsky is allergic to azithromycin.   HPI  Today, she is being contacted for a post-procedure assessment.  Evaluation of last interventional procedure  01/26/2020 Procedure:   Type: Diagnostic, Inter-Laminar, Cervical Epidural Steroid Injection  #1  Region: Posterior Cervico-thoracic Region Level: C7-T1 Laterality: Midline Paramedial  Sedation: Please see nurses note for DOS. When no sedatives are used, the analgesic levels obtained are directly associated to the effectiveness of the local anesthetics. However, when sedation is provided, the level of analgesia obtained during the initial 1 hour following the intervention, is believed to be the result of a combination of factors. These factors may include, but are not limited to: 1. The effectiveness of the local anesthetics used. 2. The effects of the analgesic(s) and/or anxiolytic(s)  used. 3. The degree of discomfort experienced by the  patient at the time of the procedure. 4. The patients ability and reliability in recalling and recording the events. 5. The presence and influence of possible secondary gains and/or psychosocial factors. Reported result: Relief experienced during the 1st hour after the procedure: 20 % (Ultra-Short Term Relief)            Interpretative annotation: Clinically appropriate result. Analgesia during this period is likely to be Local Anesthetic and/or IV Sedative (Analgesic/Anxiolytic) related.          Effects of local anesthetic: The analgesic effects attained during this period are directly associated to the localized infiltration of local anesthetics and therefore cary significant diagnostic value as to the etiological location, or anatomical origin, of the pain. Expected duration of relief is directly dependent on the pharmacodynamics of the local anesthetic used. Long-acting (4-6 hours) anesthetics used.  Reported result: Relief during the next 4 to 6 hour after the procedure: 20 % (Short-Term Relief)            Interpretative annotation: Clinically appropriate result. Analgesia during this period is likely to be Local Anesthetic-related.          Long-term benefit: Defined as the period of time past the expected duration of local anesthetics (1 hour for short-acting and 4-6 hours for long-acting). With the possible exception of prolonged sympathetic blockade from the local anesthetics, benefits during this period are typically attributed to, or associated with, other factors such as analgesic sensory neuropraxia, antiinflammatory effects, or beneficial biochemical changes provided by agents other than the local anesthetics.  Reported result: Extended relief following procedure: 50 % in middle of her neck but continues to have radiating pain in arm and shoulders (Long-Term Relief)            Interpretative annotation: Clinically appropriate result.  Good relief. No permanent benefit expected. Inflammation plays a part in the etiology to the pain.          Laboratory Chemistry Profile   Renal Lab Results  Component Value Date   BUN 10 02/08/2020   CREATININE 0.76 02/08/2020   GFR 77.67 02/08/2020     Hepatic Lab Results  Component Value Date   AST 29 02/08/2020   ALT 42 (H) 02/08/2020   ALBUMIN 4.7 02/08/2020   ALKPHOS 57 02/08/2020     Electrolytes Lab Results  Component Value Date   NA 140 02/08/2020   K 3.8 02/08/2020   CL 104 02/08/2020   CALCIUM 9.6 02/08/2020     Bone No results found for: VD25OH, VD125OH2TOT, ZO1096EA5, WU9811BJ4, 25OHVITD1, 25OHVITD2, 25OHVITD3, TESTOFREE, TESTOSTERONE   Inflammation (CRP: Acute Phase) (ESR: Chronic Phase) Lab Results  Component Value Date   ESRSEDRATE 4 12/15/2018       Note: Above Lab results reviewed.   Assessment  The primary encounter diagnosis was Cervical radicular pain (RIGHT C4/5 and C5/6). Diagnoses of Foraminal stenosis of cervical region (RIGHT) and Cervical radiculopathy were also pertinent to this visit.  Plan of Care  [name] has a current medication list which includes the following long-term medication(s): lisinopril-hydrochlorothiazide.   1. Cervical radicular pain (RIGHT C4/5 and C5/6) -C-Spine MRI:. Cervical spondylosis congenitally short pedicles, and degenerative disc disease, causing moderate to severe impingement at C5-6; moderate impingement at C3-4 and C6-7; and mild impingement at C4-5, as detailed above. -Status post cervical epidural steroid injection #1 on 01/25/2020 which helped out with her neck pain the patient continues to have radiating shoulder and bilateral arm and forearm pain.  Discussed  repeating cervical epidural steroid injection #2.  Risks and benefits reviewed and patient like to proceed. - Cervical Epidural Injection; Future  2. Foraminal stenosis of cervical region (RIGHT) - Cervical Epidural Injection;  Future  3. Cervical radiculopathy - Cervical Epidural Injection; Future  Orders:  Orders Placed This Encounter  Procedures  . Cervical Epidural Injection    Level(s): C7-T1 Laterality: TBD Purpose: Diagnostic/Therapeutic Indication(s): Radiculitis and cervicalgia associater with cervical degenerative disc disease.    Standing Status:   Future    Standing Expiration Date:   03/24/2020    Scheduling Instructions:     Procedure: Cervical Epidural Steroid Injection/Block     Sedation: Patient's choice.     Timeframe: As soon as schedule allows    Order Specific Question:   Where will this procedure be performed?    Answer:   [facility]    Comments:   Zykira Matlack   Follow-up plan:   Return in about 2 weeks (around 03/07/2020) for C-ESI #2 (5 cc), with sedation.    Recent Visits Date Type Provider Dept  01/25/20 Procedure visit Gillis Santa, MD [facility]  12/17/19 Office Visit Gillis Santa, MD [facility]  11/24/19 Office Visit Gillis Santa, MD [facility]  Showing recent visits within past 90 days and meeting all other requirements   Today's Visits Date Type Provider Dept  02/22/20 Telemedicine Gillis Santa, MD [facility]  Showing today's visits and meeting all other requirements   Future Appointments No visits were found meeting these conditions.  Showing future appointments within next 90 days and meeting all other requirements   I discussed the assessment and treatment plan with the patient. The patient was provided an opportunity to ask questions and all were answered. The patient agreed with the plan and demonstrated an understanding of the instructions.  Patient advised to call back or seek an in-person evaluation if the symptoms or condition worsens.  Duration of encounter:25 minutes.  Note by: Gillis Santa, MD Date: 02/22/2020; Time: 2:33 PM

## 2020-03-07 ENCOUNTER — Encounter: Payer: Self-pay | Admitting: Gastroenterology

## 2020-03-07 ENCOUNTER — Other Ambulatory Visit: Payer: Self-pay

## 2020-03-10 ENCOUNTER — Other Ambulatory Visit: Payer: Self-pay

## 2020-03-10 ENCOUNTER — Other Ambulatory Visit
Admission: RE | Admit: 2020-03-10 | Discharge: 2020-03-10 | Disposition: A | Discharge status: Home / Self Care | Payer: HRSA Program | Source: Ambulatory Visit | Attending: Gastroenterology | Admitting: Gastroenterology

## 2020-03-10 DIAGNOSIS — Z20822 Contact with and (suspected) exposure to covid-19: Secondary | ICD-10-CM | POA: Insufficient documentation

## 2020-03-10 DIAGNOSIS — Z01812 Encounter for preprocedural laboratory examination: Secondary | ICD-10-CM | POA: Diagnosis present

## 2020-03-10 LAB — SARS CORONAVIRUS 2 (TAT 6-24 HRS): SARS Coronavirus 2: NEGATIVE

## 2020-03-11 NOTE — Discharge Instructions (Signed)
General Anesthesia, Adult, Care After This sheet gives you information about how to care for yourself after your procedure. Your health care provider may also give you more specific instructions. If you have problems or questions, contact your health care provider. What can I expect after the procedure? After the procedure, the following side effects are common:  Pain or discomfort at the IV site.  Nausea.  Vomiting.  Sore throat.  Trouble concentrating.  Feeling cold or chills.  Weak or tired.  Sleepiness and fatigue.  Soreness and body aches. These side effects can affect parts of the body that were not involved in surgery. Follow these instructions at home:  For at least 24 hours after the procedure:  Have a responsible adult stay with you. It is important to have someone help care for you until you are awake and alert.  Rest as needed.  Do not: ? Participate in activities in which you could fall or become injured. ? Drive. ? Use heavy machinery. ? Drink alcohol. ? Take sleeping pills or medicines that cause drowsiness. ? Make important decisions or sign legal documents. ? Take care of children on your own. Eating and drinking  Follow any instructions from your health care provider about eating or drinking restrictions.  When you feel hungry, start by eating small amounts of foods that are soft and easy to digest (bland), such as toast. Gradually return to your regular diet.  Drink enough fluid to keep your urine pale yellow.  If you vomit, rehydrate by drinking water, juice, or clear broth. General instructions  If you have sleep apnea, surgery and certain medicines can increase your risk for breathing problems. Follow instructions from your health care provider about wearing your sleep device: ? Anytime you are sleeping, including during daytime naps. ? While taking prescription pain medicines, sleeping medicines, or medicines that make you drowsy.  Return to  your normal activities as told by your health care provider. Ask your health care provider what activities are safe for you.  Take over-the-counter and prescription medicines only as told by your health care provider.  If you smoke, do not smoke without supervision.  Keep all follow-up visits as told by your health care provider. This is important. Contact a health care provider if:  You have nausea or vomiting that does not get better with medicine.  You cannot eat or drink without vomiting.  You have pain that does not get better with medicine.  You are unable to pass urine.  You develop a skin rash.  You have a fever.  You have redness around your IV site that gets worse. Get help right away if:  You have difficulty breathing.  You have chest pain.  You have blood in your urine or stool, or you vomit blood. Summary  After the procedure, it is common to have a sore throat or nausea. It is also common to feel tired.  Have a responsible adult stay with you for the first 24 hours after general anesthesia. It is important to have someone help care for you until you are awake and alert.  When you feel hungry, start by eating small amounts of foods that are soft and easy to digest (bland), such as toast. Gradually return to your regular diet.  Drink enough fluid to keep your urine pale yellow.  Return to your normal activities as told by your health care provider. Ask your health care provider what activities are safe for you. This information is not   intended to replace advice given to you by your health care provider. Make sure you discuss any questions you have with your health care provider. Document Revised: 10/11/2017 Document Reviewed: 05/24/2017 Elsevier Patient Education  2020 [employer]  

## 2020-03-14 ENCOUNTER — Encounter
Admission: RE | Disposition: A | Discharge status: Home / Self Care | Payer: Self-pay | Source: Home / Self Care | Attending: Gastroenterology

## 2020-03-14 ENCOUNTER — Encounter: Payer: Self-pay | Admitting: Gastroenterology

## 2020-03-14 ENCOUNTER — Other Ambulatory Visit: Payer: Self-pay

## 2020-03-14 ENCOUNTER — Ambulatory Visit
Admission: RE | Admit: 2020-03-14 | Discharge: 2020-03-14 | Disposition: A | Discharge status: Home / Self Care | Payer: Self-pay | Attending: Gastroenterology | Admitting: Gastroenterology

## 2020-03-14 ENCOUNTER — Ambulatory Visit: Payer: Self-pay | Admitting: Anesthesiology

## 2020-03-14 DIAGNOSIS — Z87891 Personal history of nicotine dependence: Secondary | ICD-10-CM | POA: Insufficient documentation

## 2020-03-14 DIAGNOSIS — E785 Hyperlipidemia, unspecified: Secondary | ICD-10-CM | POA: Insufficient documentation

## 2020-03-14 DIAGNOSIS — I10 Essential (primary) hypertension: Secondary | ICD-10-CM | POA: Insufficient documentation

## 2020-03-14 DIAGNOSIS — K648 Other hemorrhoids: Secondary | ICD-10-CM | POA: Insufficient documentation

## 2020-03-14 DIAGNOSIS — Z1211 Encounter for screening for malignant neoplasm of colon: Secondary | ICD-10-CM | POA: Insufficient documentation

## 2020-03-14 DIAGNOSIS — M1990 Unspecified osteoarthritis, unspecified site: Secondary | ICD-10-CM | POA: Insufficient documentation

## 2020-03-14 DIAGNOSIS — Z860100 Personal history of colon polyps, unspecified: Secondary | ICD-10-CM

## 2020-03-14 DIAGNOSIS — Z8719 Personal history of other diseases of the digestive system: Secondary | ICD-10-CM | POA: Insufficient documentation

## 2020-03-14 HISTORY — PX: COLONOSCOPY WITH PROPOFOL: SHX5780

## 2020-03-14 SURGERY — COLONOSCOPY WITH PROPOFOL
Anesthesia: General | Site: Rectum

## 2020-03-14 MED ORDER — LIDOCAINE HCL (CARDIAC) PF 100 MG/5ML IV SOSY
PREFILLED_SYRINGE | INTRAVENOUS | Status: DC | PRN
  Administered 2020-03-14: 30 mg via INTRAVENOUS

## 2020-03-14 MED ORDER — LACTATED RINGERS IV SOLN: INTRAVENOUS | Status: DC

## 2020-03-14 MED ORDER — CHLORHEXIDINE GLUCONATE 0.12 % MT SOLN: 15.0000 mL | Freq: Once | OROMUCOSAL | Status: DC

## 2020-03-14 MED ORDER — LACTATED RINGERS IV SOLN: 1000.0000 mL | Freq: Once | INTRAVENOUS | Status: DC

## 2020-03-14 MED ORDER — STERILE WATER FOR IRRIGATION IR SOLN
Status: DC | PRN
  Administered 2020-03-14: .05 mL

## 2020-03-14 MED ORDER — ORAL CARE MOUTH RINSE: 15.0000 mL | Freq: Once | OROMUCOSAL | Status: DC

## 2020-03-14 MED ORDER — SODIUM CHLORIDE 0.9 % IV SOLN: INTRAVENOUS | Status: DC

## 2020-03-14 MED ORDER — PROPOFOL 10 MG/ML IV BOLUS
INTRAVENOUS | Status: DC | PRN
  Administered 2020-03-14: 40 mg via INTRAVENOUS
  Administered 2020-03-14: 140 mg via INTRAVENOUS
  Administered 2020-03-14: 30 mg via INTRAVENOUS
  Administered 2020-03-14: 20 mg via INTRAVENOUS

## 2020-03-14 SURGICAL SUPPLY — 5 items
GOWN CVR UNV OPN BCK APRN NK (MISCELLANEOUS) ×2 IMPLANT
GOWN ISOL THUMB LOOP REG UNIV (MISCELLANEOUS) ×4
KIT ENDO PROCEDURE OLY (KITS) ×3 IMPLANT
MANIFOLD NEPTUNE II (INSTRUMENTS) ×2 IMPLANT
WATER STERILE IRR 250ML POUR (IV SOLUTION) ×3 IMPLANT

## 2020-03-14 NOTE — Anesthesia Postprocedure Evaluation (Signed)
Anesthesia Post Note  Patient: Jaime Johnson  Procedure(s) Performed: COLONOSCOPY WITH PROPOFOL (N/A Rectum)     Patient location during evaluation: PACU Anesthesia Type: General Level of consciousness: awake and alert Pain management: pain level controlled Vital Signs Assessment: post-procedure vital signs reviewed and stable Respiratory status: spontaneous breathing, nonlabored ventilation, respiratory function stable and patient connected to nasal cannula oxygen Cardiovascular status: blood pressure returned to baseline and stable Postop Assessment: no apparent nausea or vomiting Anesthetic complications: no    Alisa Graff

## 2020-03-14 NOTE — Transfer of Care (Signed)
Immediate Anesthesia Transfer of Care Note  Patient: Jaime Johnson  Procedure(s) Performed: COLONOSCOPY WITH PROPOFOL (N/A Rectum)  Patient Location: PACU  Anesthesia Type: General  Level of Consciousness: awake, alert  and patient cooperative  Airway and Oxygen Therapy: Patient Spontanous Breathing and Patient connected to supplemental oxygen  Post-op Assessment: Post-op Vital signs reviewed, Patient's Cardiovascular Status Stable, Respiratory Function Stable, Patent Airway and No signs of Nausea or vomiting  Post-op Vital Signs: Reviewed and stable  Complications: No apparent anesthesia complications

## 2020-03-14 NOTE — Anesthesia Preprocedure Evaluation (Signed)
Anesthesia Evaluation  Patient identified by MRN, date of birth, ID band Patient awake    Reviewed: Allergy & Precautions, H&P , NPO status , Patient's Chart, lab work & pertinent test results, reviewed documented beta blocker date and time   Airway Mallampati: II  TM Distance: >3 FB Neck ROM: full    Dental no notable dental hx.    Pulmonary neg pulmonary ROS, former smoker,    Pulmonary exam normal breath sounds clear to auscultation       Cardiovascular Exercise Tolerance: Good hypertension,  Rhythm:regular Rate:Normal     Neuro/Psych negative neurological ROS  negative psych ROS   GI/Hepatic negative GI ROS, Neg liver ROS,   Endo/Other  negative endocrine ROS  Renal/GU negative Renal ROS  negative genitourinary   Musculoskeletal  (+) Arthritis ,   Abdominal   Peds  Hematology  (+) Blood dyscrasia, anemia ,   Anesthesia Other Findings   Reproductive/Obstetrics negative OB ROS                             Anesthesia Physical Anesthesia Plan  ASA: II  Anesthesia Plan: General   Post-op Pain Management:    Induction:   PONV Risk Score and Plan: 3 and Propofol infusion, TIVA and Treatment may vary due to age or medical condition  Airway Management Planned:   Additional Equipment:   Intra-op Plan:   Post-operative Plan:   Informed Consent: I have reviewed the patients History and Physical, chart, labs and discussed the procedure including the risks, benefits and alternatives for the proposed anesthesia with the patient or authorized representative who has indicated his/her understanding and acceptance.     Dental Advisory Given  Plan Discussed with: CRNA  Anesthesia Plan Comments:         Anesthesia Quick Evaluation

## 2020-03-14 NOTE — Op Note (Signed)
[facility] Patient Name: Jaime Johnson Procedure Date: 03/14/2020 8:34 AM MRN: [id number] Account #: [id number] Date of Birth: 1961-09-20 Admit Type: Outpatient Age: 59 Room: [facility] OR ROOM 01 Gender: Female Note Status: Finalized Procedure:             Colonoscopy Indications:           High risk colon cancer surveillance: Personal history                         of colonic polyps Providers:             Lucilla Lame MD, MD Referring MD:          Maud Deed. Copland MD, MD (Referring MD) Medicines:             Propofol per Anesthesia Complications:         No immediate complications. Procedure:             Pre-Anesthesia Assessment:                        - Prior to the procedure, a History and Physical was                         performed, and patient medications and allergies were                         reviewed. The patient's tolerance of previous                         anesthesia was also reviewed. The risks and benefits                         of the procedure and the sedation options and risks                         were discussed with the patient. All questions were                         answered, and informed consent was obtained. Prior                         Anticoagulants: The patient has taken no previous                         anticoagulant or antiplatelet agents. ASA Grade                         Assessment: II - A patient with mild systemic disease.                         After reviewing the risks and benefits, the patient                         was deemed in satisfactory condition to undergo the                         procedure.  After obtaining informed consent, the colonoscope was                         passed under direct vision. Throughout the procedure,                         the patient's blood pressure, pulse, and oxygen                         saturations were monitored continuously. The was                         introduced through the anus and advanced to the the                         cecum, identified by appendiceal orifice and ileocecal                         valve. The colonoscopy was performed without                         difficulty. The patient tolerated the procedure well.                         The quality of the bowel preparation was excellent. Findings:      The perianal and digital rectal examinations were normal.      Non-bleeding internal hemorrhoids were found during retroflexion. The       hemorrhoids were Grade I (internal hemorrhoids that do not prolapse). Impression:            - Non-bleeding internal hemorrhoids.                        - No specimens collected. Recommendation:        - Discharge patient to home.                        - Resume previous diet.                        - Continue present medications.                        - Repeat colonoscopy in 5 years for surveillance. Procedure Code(s):     --- Professional ---                        [phone], Colonoscopy, flexible; diagnostic, including                         collection of specimen(s) by brushing or washing, when                         performed (separate procedure) Diagnosis Code(s):     --- Professional ---                        Z86.010, Personal history of colonic polyps CPT copyright 2019 [employer] Association. All rights reserved. The codes documented in this report are preliminary and upon coder review may  be revised to  meet current compliance requirements. Lucilla Lame MD, MD 03/14/2020 9:01:22 AM This report has been signed electronically. Number of Addenda: 0 Note Initiated On: 03/14/2020 8:34 AM Scope Withdrawal Time: 0 hours 7 minutes 47 seconds  Total Procedure Duration: 0 hours 12 minutes 30 seconds  Estimated Blood Loss:  Estimated blood loss: none.      [facility]

## 2020-03-14 NOTE — H&P (Signed)
Lucilla Lame, MD [address] [place], [place] [address] Phone:[phone] Fax : [phone]  Primary Care Physician:  Owens Loffler, MD Primary Gastroenterologist:  Dr. Allen Norris  Pre-Procedure History & Physical: HPI:  Jaime Johnson is a 59 y.o. female is here for an colonoscopy.   Past Medical History:  Diagnosis Date  . Allergic rhinitis due to pollen   . Anemia   . Arthritis    Hands, knees, shoulder  . Hyperlipidemia LDL goal <100 02/16/2020  . Hypertension 12/19/2016  . Motion sickness    cars    Past Surgical History:  Procedure Laterality Date  . CESAREAN SECTION    . COLONOSCOPY WITH PROPOFOL N/A 11/12/2016   Procedure: COLONOSCOPY WITH PROPOFOL;  Surgeon: Lucilla Lame, MD;  Location: [place];  Service: Endoscopy;  Laterality: N/A;  . COLONOSCOPY WITH PROPOFOL N/A 03/29/2017   Procedure: COLONOSCOPY WITH PROPOFOL;  Surgeon: Lucilla Lame, MD;  Location: [place];  Service: Endoscopy;  Laterality: N/A;  . POLYPECTOMY  11/12/2016   Procedure: POLYPECTOMY;  Surgeon: Lucilla Lame, MD;  Location: [place];  Service: Endoscopy;;  . POLYPECTOMY  03/29/2017   Procedure: POLYPECTOMY;  Surgeon: Lucilla Lame, MD;  Location: [place];  Service: Endoscopy;;    Prior to Admission medications   Medication Sig Start Date End Date Taking? Authorizing Provider  lisinopril-hydrochlorothiazide (ZESTORETIC) 10-12.5 MG tablet Take 1 tablet by mouth daily. 02/15/20  Yes Copland, Frederico Hamman, MD  LUMIFY 0.025 % SOLN Apply 1 drop to eye as needed.  09/24/19  Yes [provider]  Multiple Vitamins-Minerals (MULTIVITAMIN GUMMIES ADULTS PO) Take by mouth daily.   Yes [provider]  SYSTANE COMPLETE 0.6 % SOLN  09/24/19  Yes [provider]  traMADol (ULTRAM) 50 MG tablet Take 1 tablet (50 mg total) by mouth daily. 02/15/20  Yes Copland, Frederico Hamman, MD    Allergies as of 02/02/2020 - Review Complete 02/02/2020  Allergen  Reaction Noted  . Azithromycin Other (See Comments) 04/13/2019    Family History  Problem Relation Age of Onset  . Hepatitis C Mother 2       d/c at 34  . Hepatitis C Brother   . Hypertension Father     Social History   Socioeconomic History  . Marital status: Married    Spouse name: Not on file  . Number of children: Not on file  . Years of education: Not on file  . Highest education level: Not on file  Occupational History  . Not on file  Tobacco Use  . Smoking status: Former Smoker    Quit date: 10/22/1978    Years since quitting: 41.4  . Smokeless tobacco: Never Used  Substance and Sexual Activity  . Alcohol use: No  . Drug use: No  . Sexual activity: Yes    Partners: Male  Other Topics Concern  . Not on file  Social History Narrative  . Not on file   Social Determinants of Health   Financial Resource Strain:   . Difficulty of Paying Living Expenses:   Food Insecurity:   . Worried About Charity fundraiser in the Last Year:   . Arboriculturist in the Last Year:   Transportation Needs:   . Film/video editor (Medical):   Marland Kitchen Lack of Transportation (Non-Medical):   Physical Activity:   . Days of Exercise per Week:   . Minutes of Exercise per Session:   Stress:   . Feeling of  Stress :   Social Connections:   . Frequency of Communication with Friends and Family:   . Frequency of Social Gatherings with Friends and Family:   . Attends Religious Services:   . Active Member of Clubs or Organizations:   . Attends Archivist Meetings:   Marland Kitchen Marital Status:   Intimate Partner Violence:   . Fear of Current or Ex-Partner:   . Emotionally Abused:   Marland Kitchen Physically Abused:   . Sexually Abused:     Review of Systems: See HPI, otherwise negative ROS  Physical Exam: BP (!) 165/87   Pulse 65   Ht 5\' 2"  (1.575 m)   Wt 68.9 kg   LMP 02/29/2020 (Approximate) Comment: Has had random spotting; hadn't had a period 1.5 yrs prior  SpO2 99%   BMI 27.80 kg/m   General:   Alert,  pleasant and cooperative in NAD Head:  Normocephalic and atraumatic. Neck:  Supple; no masses or thyromegaly. Lungs:  Clear throughout to auscultation.    Heart:  Regular rate and rhythm. Abdomen:  Soft, nontender and nondistended. Normal bowel sounds, without guarding, and without rebound.   Neurologic:  Alert and  oriented x4;  grossly normal neurologically.  Impression/Plan: Jaime Johnson is here for an colonoscopy to be performed for history of adenomatous polyps in 03/2017  Risks, benefits, limitations, and alternatives regarding  colonoscopy have been reviewed with the patient.  Questions have been answered.  All parties agreeable.   Lucilla Lame, MD  03/14/2020, 8:34 AM

## 2020-03-14 NOTE — Anesthesia Procedure Notes (Signed)
Date/Time: 03/14/2020 8:43 AM Performed by: Cameron Ali, CRNA Pre-anesthesia Checklist: Patient identified, Emergency Drugs available, Suction available, Timeout performed and Patient being monitored Patient Re-evaluated:Patient Re-evaluated prior to induction Oxygen Delivery Method: Nasal cannula Placement Confirmation: positive ETCO2

## 2020-03-15 ENCOUNTER — Encounter: Payer: Self-pay | Admitting: *Deleted

## 2020-03-16 ENCOUNTER — Ambulatory Visit
Admission: RE | Admit: 2020-03-16 | Discharge: 2020-03-16 | Disposition: A | Discharge status: Home / Self Care | Payer: Self-pay | Source: Ambulatory Visit | Attending: Student in an Organized Health Care Education/Training Program | Admitting: Student in an Organized Health Care Education/Training Program

## 2020-03-16 ENCOUNTER — Encounter: Payer: Self-pay | Admitting: Student in an Organized Health Care Education/Training Program

## 2020-03-16 ENCOUNTER — Ambulatory Visit (HOSPITAL_BASED_OUTPATIENT_CLINIC_OR_DEPARTMENT_OTHER): Payer: Self-pay | Admitting: Student in an Organized Health Care Education/Training Program

## 2020-03-16 ENCOUNTER — Other Ambulatory Visit: Payer: Self-pay

## 2020-03-16 ENCOUNTER — Other Ambulatory Visit: Payer: Self-pay | Admitting: Student in an Organized Health Care Education/Training Program

## 2020-03-16 ENCOUNTER — Ambulatory Visit: Payer: Self-pay | Admitting: Student in an Organized Health Care Education/Training Program

## 2020-03-16 VITALS — BP 180/83 | HR 68 | Temp 98.3°F | Resp 16 | Ht 62.0 in | Wt 152.0 lb

## 2020-03-16 DIAGNOSIS — M5412 Radiculopathy, cervical region: Secondary | ICD-10-CM

## 2020-03-16 DIAGNOSIS — M4802 Spinal stenosis, cervical region: Secondary | ICD-10-CM | POA: Insufficient documentation

## 2020-03-16 DIAGNOSIS — R52 Pain, unspecified: Secondary | ICD-10-CM

## 2020-03-16 MED ORDER — MIDAZOLAM HCL 5 MG/5ML IJ SOLN
INTRAMUSCULAR | Status: AC
  Filled 2020-03-16: qty 5

## 2020-03-16 MED ORDER — DEXAMETHASONE SODIUM PHOSPHATE 10 MG/ML IJ SOLN
INTRAMUSCULAR | Status: AC
  Filled 2020-03-16: qty 1

## 2020-03-16 MED ORDER — IOHEXOL 180 MG/ML  SOLN
INTRAMUSCULAR | Status: AC
  Filled 2020-03-16: qty 20

## 2020-03-16 MED ORDER — SODIUM CHLORIDE (PF) 0.9 % IJ SOLN
INTRAMUSCULAR | Status: AC
  Filled 2020-03-16: qty 10

## 2020-03-16 MED ORDER — LIDOCAINE HCL 2 % IJ SOLN
INTRAMUSCULAR | Status: AC
  Filled 2020-03-16: qty 20

## 2020-03-16 MED ORDER — DEXAMETHASONE SODIUM PHOSPHATE 10 MG/ML IJ SOLN
10.0000 mg | Freq: Once | INTRAMUSCULAR | Status: AC
  Administered 2020-03-16: 10 mg

## 2020-03-16 MED ORDER — ROPIVACAINE HCL 2 MG/ML IJ SOLN
INTRAMUSCULAR | Status: AC
  Filled 2020-03-16: qty 10

## 2020-03-16 MED ORDER — FENTANYL CITRATE (PF) 100 MCG/2ML IJ SOLN
INTRAMUSCULAR | Status: AC
  Filled 2020-03-16: qty 2

## 2020-03-16 MED ORDER — LIDOCAINE HCL 2 % IJ SOLN
20.0000 mL | Freq: Once | INTRAMUSCULAR | Status: AC
  Administered 2020-03-16: 400 mg

## 2020-03-16 MED ORDER — DIAZEPAM 5 MG PO TABS: 5.0000 mg | ORAL_TABLET | Freq: Two times a day (BID) | ORAL | 0 refills | Status: AC | PRN

## 2020-03-16 MED ORDER — MIDAZOLAM HCL 5 MG/5ML IJ SOLN: 1.0000 mg | INTRAMUSCULAR | Status: DC | PRN

## 2020-03-16 MED ORDER — FENTANYL CITRATE (PF) 100 MCG/2ML IJ SOLN
25.0000 ug | INTRAMUSCULAR | Status: DC | PRN
  Administered 2020-03-16: 50 ug via INTRAVENOUS

## 2020-03-16 MED ORDER — SODIUM CHLORIDE 0.9% FLUSH
1.0000 mL | Freq: Once | INTRAVENOUS | Status: AC
  Administered 2020-03-16: 1 mL

## 2020-03-16 MED ORDER — ROPIVACAINE HCL 2 MG/ML IJ SOLN
1.0000 mL | Freq: Once | INTRAMUSCULAR | Status: AC
  Administered 2020-03-16: 1 mL via EPIDURAL

## 2020-03-16 NOTE — Progress Notes (Signed)
Safety precautions to be maintained throughout the outpatient stay will include: orient to surroundings, keep bed in low position, maintain call bell within reach at all times, provide assistance with transfer out of bed and ambulation.  

## 2020-03-16 NOTE — Progress Notes (Signed)
Amyjo Mizrachi NOTE: Information contained herein reflects review and annotations entered in association with encounter. Interpretation of such information and data should be left to medically-trained personnel. Information provided to patient can be located elsewhere in the medical record under "Patient Instructions". Document created using STT-dictation technology, any transcriptional errors that may result from process are unintentional.    Patient: Jaime Johnson  Service Category: Procedure  Neshia Mckenzie: Gillis Santa, MD  DOB: October 31, 1960  DOS: 03/16/2020  Location: [facility]  MRN: [id number]  Setting: Ambulatory - outpatient  Referring Sparsh Callens: Owens Loffler, MD  Type: Established Patient  Specialty: Interventional Pain Management  PCP: Owens Loffler, MD   Primary Reason for Visit: Interventional Pain Management Treatment. CC: Neck Pain, Shoulder Pain (bilateral), and Arm Pain (bilateral)  Procedure:          Anesthesia, Analgesia, Anxiolysis:  Type: Diagnostic, Inter-Laminar, Cervical Epidural Steroid Injection  #2  Region: Posterior Cervico-thoracic Region Level: C7-T1 Laterality: Midline Paramedial  Type: Moderate (Conscious) Sedation combined with Local Anesthesia Indication(s): Analgesia and Anxiety Route: Intravenous (IV) IV Access: Secured Sedation: Meaningful verbal contact was maintained at all times during the procedure  Local Anesthetic: Lidocaine 1-2%  Position: Prone with head of the table was raised to facilitate breathing.   Indications: 1. 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 Cervical radiculopathy    Pain Score: Pre-procedure: 4 /10 Post-procedure: 0-No pain/10   4 out of 5 strength RIGHT upper extremity: Shoulder abduction, elbow flexion, elbow extension, thumb extension. 5 out of 5 strength LEFT upper extremity extremity: Shoulder abduction, elbow flexion, elbow extension, thumb  extension.   Pre-op Assessment:  Jaime Johnson is a 59 y.o. (year old), female patient, seen today for interventional treatment. She  has a past surgical history that includes Cesarean section; Colonoscopy with propofol (N/A, 11/12/2016); polypectomy (11/12/2016); Colonoscopy with propofol (N/A, 03/29/2017); polypectomy (03/29/2017); and Colonoscopy with propofol (N/A, 03/14/2020). [name] has a current medication list which includes the following prescription(s): lisinopril-hydrochlorothiazide, lumify, multiple vitamins-minerals, systane complete, tramadol, and diazepam, and the following Facility-Administered Medications: fentanyl and midazolam. Her primarily concern today is the Neck Pain, Shoulder Pain (bilateral), and Arm Pain (bilateral)  Initial Vital Signs:  Pulse/HCG Rate: 68ECG Heart Rate: 68 Temp: 97.8 F (36.6 C) Resp: 18 BP: (!) 187/88 SpO2: 100 %  BMI: Estimated body mass index is 27.8 kg/m as calculated from the following:   Height as of this encounter: 5\' 2"  (1.575 m).   Weight as of this encounter: 152 lb (68.9 kg).  Risk Assessment: Allergies: Reviewed. She is allergic to azithromycin.  Allergy Precautions: None required Coagulopathies: Reviewed. None identified.  Blood-thinner therapy: None at this time Active Infection(s): Reviewed. None identified. Ms. Purse is afebrile  Site Confirmation: Jaime Johnson was asked to confirm the procedure and laterality before marking the site Procedure checklist: Completed Consent: Before the procedure and under the influence of no sedative(s), amnesic(s), or anxiolytics, the patient was informed of the treatment options, risks and possible complications. To fulfill our ethical and legal obligations, as recommended by the American Medical Association's Code of Ethics, I have informed the patient of my clinical impression; the nature and purpose of the treatment or procedure; the risks, benefits, and possible complications of the  intervention; the alternatives, including doing nothing; the risk(s) and benefit(s) of the alternative treatment(s) or procedure(s); and the risk(s) and benefit(s) of doing nothing. The patient was provided information about the general risks  and possible complications associated with the procedure. These may include, but are not limited to: failure to achieve desired goals, infection, bleeding, organ or nerve damage, allergic reactions, paralysis, and death. In addition, the patient was informed of those risks and complications associated to Spine-related procedures, such as failure to decrease pain; infection (i.e.: Meningitis, epidural or intraspinal abscess); bleeding (i.e.: epidural hematoma, subarachnoid hemorrhage, or any other type of intraspinal or peri-dural bleeding); organ or nerve damage (i.e.: Any type of peripheral nerve, nerve root, or spinal cord injury) with subsequent damage to sensory, motor, and/or autonomic systems, resulting in permanent pain, numbness, and/or weakness of one or several areas of the body; allergic reactions; (i.e.: anaphylactic reaction); and/or death. Furthermore, the patient was informed of those risks and complications associated with the medications. These include, but are not limited to: allergic reactions (i.e.: anaphylactic or anaphylactoid reaction(s)); adrenal axis suppression; blood sugar elevation that in diabetics may result in ketoacidosis or comma; water retention that in patients with history of congestive heart failure may result in shortness of breath, pulmonary edema, and decompensation with resultant heart failure; weight gain; swelling or edema; medication-induced neural toxicity; particulate matter embolism and blood vessel occlusion with resultant organ, and/or nervous system infarction; and/or aseptic necrosis of one or more joints. Finally, the patient was informed that Medicine is not an exact science; therefore, there is also the possibility of  unforeseen or unpredictable risks and/or possible complications that may result in a catastrophic outcome. The patient indicated having understood very clearly. We have given the patient no guarantees and we have made no promises. Enough time was given to the patient to ask questions, all of which were answered to the patient's satisfaction. Ms. Claytor has indicated that she wanted to continue with the procedure. Attestation: I, the ordering Sigurd Pugh, attest that I have discussed with the patient the benefits, risks, side-effects, alternatives, likelihood of achieving goals, and potential problems during recovery for the procedure that I have provided informed consent. Date  Time: 03/16/2020 12:57 PM  Pre-Procedure Preparation:  Monitoring: As per clinic protocol. Respiration, ETCO2, SpO2, BP, heart rate and rhythm monitor placed and checked for adequate function Safety Precautions: Patient was assessed for positional comfort and pressure points before starting the procedure. Time-out: I initiated and conducted the "Time-out" before starting the procedure, as per protocol. The patient was asked to participate by confirming the accuracy of the "Time Out" information. Verification of the correct person, site, and procedure were performed and confirmed by me, the nursing staff, and the patient. "Time-out" conducted as per Joint Commission's Universal Protocol (UP.01.01.01). Time: 1358  Description of Procedure:          Target Area: For Epidural Steroid injections the target is the interlaminar space, initially targeting the lower border of the superior vertebral body lamina. Approach: Paramedial approach. Area Prepped: Entire PosteriorCervical Region DuraPrep (Iodine Povacrylex [0.7% available iodine] and Isopropyl Alcohol, 74% w/w) Safety Precautions: Aspiration looking for blood return was conducted prior to all injections. At no point did we inject any substances, as a needle was being advanced. No  attempts were made at seeking any paresthesias. Safe injection practices and needle disposal techniques used. Medications properly checked for expiration dates. SDV (single dose vial) medications used. Description of the Procedure: Protocol guidelines were followed. The procedure needle was introduced through the skin, ipsilateral to the reported pain, and advanced to the target area. Bone was contacted and the needle walked caudad, until the lamina was cleared. The epidural space was  identified using "loss-of-resistance technique" with 2-3 ml of PF-NaCl (0.9% NSS), in a 5cc LOR glass syringe. Vitals:   03/16/20 1410 03/16/20 1412 03/16/20 1422 03/16/20 1432  BP: (!) 176/129 (!) 177/83 (!) 178/78 (!) 180/83  Pulse:      Resp: 16 13 20 16   Temp:  98.3 F (36.8 C)  98.3 F (36.8 C)  TempSrc:      SpO2: 98% 99% 99% 100%  Weight:      Height:        Start Time: 1358 hrs. End Time: 1406 hrs. Materials:  Needle(s) Type: Epidural needle Gauge: 22G Length: 3.5-in Medication(s): Please see orders for medications and dosing details. 5 cc solution made of 2 cc of preservative-free saline, 2 cc of 0.2% ropivacaine, 1 cc of Decadron 10 mg/cc.  Imaging Guidance (Spinal):          Type of Imaging Technique: Fluoroscopy Guidance (Spinal) Indication(s): Assistance in needle guidance and placement for procedures requiring needle placement in or near specific anatomical locations not easily accessible without such assistance. Exposure Time: Please see nurses notes. Contrast: Before injecting any contrast, we confirmed that the patient did not have an allergy to iodine, shellfish, or radiological contrast. Once satisfactory needle placement was completed at the desired level, radiological contrast was injected. Contrast injected under live fluoroscopy. No contrast complications. See chart for type and volume of contrast used. Fluoroscopic Guidance: I was personally present during the use of fluoroscopy.  "Tunnel Vision Technique" used to obtain the best possible view of the target area. Parallax error corrected before commencing the procedure. "Direction-depth-direction" technique used to introduce the needle under continuous pulsed fluoroscopy. Once target was reached, antero-posterior, oblique, and lateral fluoroscopic projection used confirm needle placement in all planes. Images permanently stored in EMR. Interpretation: I personally interpreted the imaging intraoperatively. Adequate needle placement confirmed in multiple planes. Appropriate spread of contrast into desired area was observed. No evidence of afferent or efferent intravascular uptake. No intrathecal or subarachnoid spread observed. Permanent images saved into the patient's record.  Antibiotic Prophylaxis:   Anti-infectives (From admission, onward)   None     Indication(s): None identified  Post-operative Assessment:  Post-procedure Vital Signs:  Pulse/HCG Rate: 6867 Temp: 98.3 F (36.8 C) Resp: 16 BP: (!) 180/83 SpO2: 100 %  EBL: None  Complications: No immediate post-treatment complications observed by team, or reported by patient.  Note: The patient tolerated the entire procedure well. A repeat set of vitals were taken after the procedure and the patient was kept under observation following institutional policy, for this type of procedure. Post-procedural neurological assessment was performed, showing return to baseline, prior to discharge. The patient was provided with post-procedure discharge instructions, including a section on how to identify potential problems. Should any problems arise concerning this procedure, the patient was given instructions to immediately contact us, at any time, without hesitation. In any case, we plan to contact the patient by telephone for a follow-up status report regarding this interventional procedure.  Comments:  No additional relevant information.  4 out of 5 strength RIGHT upper  extremity: Shoulder abduction, elbow flexion, elbow extension, thumb extension. 5 out of 5 strength LEFT upper extremity extremity: Shoulder abduction, elbow flexion, elbow extension, thumb extension. Same as baseline  Plan of Care    Medications ordered for procedure: Meds ordered this encounter  Medications  . sodium chloride flush (NS) 0.9 % injection 1 mL  . ropivacaine (PF) 2 mg/mL (0.2%) (NAROPIN) injection 1 mL  . dexamethasone (DECADRON)  injection 10 mg  . lidocaine (XYLOCAINE) 2 % (with pres) injection 400 mg  . midazolam (VERSED) 5 MG/5ML injection 1-2 mg    Make sure Flumazenil is available in the pyxis when using this medication. If oversedation occurs, administer 0.2 mg IV over 15 sec. If after 45 sec no response, administer 0.2 mg again over 1 min; may repeat at 1 min intervals; not to exceed 4 doses (1 mg)  . diazepam (VALIUM) 5 MG tablet    Sig: Take 1 tablet (5 mg total) by mouth every 12 (twelve) hours as needed for up to 7 days for muscle spasms.    Dispense:  14 tablet    Refill:  0  . fentaNYL (SUBLIMAZE) injection 25-50 mcg    Make sure Narcan is available in the pyxis when using this medication. In the event of respiratory depression (RR< 8/min): Titrate NARCAN (naloxone) in increments of 0.1 to 0.2 mg IV at 2-3 minute intervals, until desired degree of reversal.     Follow-up plan:   Return in about 4 weeks (around 04/13/2020) for Post Procedure Evaluation, in person.     Recent Visits Date Type [facility] Dept  02/22/20 Telemedicine Gillis Santa, MD [facility]  01/25/20 Procedure visit Gillis Santa, MD [facility]  12/17/19 Office Visit Gillis Santa, MD [facility]  Showing recent visits within past 90 days and meeting all other requirements   Today's Visits Date Type [facility] Dept  03/16/20 Procedure visit Gillis Santa, MD [facility]  Showing today's visits and meeting all other requirements   Future  Appointments Date Type [facility] Dept  04/13/20 Appointment Gillis Santa, MD [facility]  Showing future appointments within next 90 days and meeting all other requirements   Disposition: Discharge home  Discharge (Date  Time): 03/16/2020; 1433 hrs.   Primary Care Physician: Owens Loffler, MD Location: [facility] Note by: Gillis Santa, MD Date: 03/16/2020; Time: 3:41 PM  Disclaimer:  Medicine is not an exact science. The only guarantee in medicine is that nothing is guaranteed. It is important to note that the decision to proceed with this intervention was based on the information collected from the patient. The Data and conclusions were drawn from the patient's questionnaire, the interview, and the physical examination. Because the information was provided in large part by the patient, it cannot be guaranteed that it has not been purposely or unconsciously manipulated. Every effort has been made to obtain as much relevant data as possible for this evaluation. It is important to note that the conclusions that lead to this procedure are derived in large part from the available data. Always take into account that the treatment will also be dependent on availability of resources and existing treatment guidelines, considered by other Pain Management Practitioners as being common knowledge and practice, at the time of the intervention. For Medico-Legal purposes, it is also important to point out that variation in procedural techniques and pharmacological choices are the acceptable norm. The indications, contraindications, technique, and results of the above procedure should only be interpreted and judged by a Board-Certified Interventional Pain Specialist with extensive familiarity and expertise in the same exact procedure and technique.

## 2020-03-16 NOTE — Patient Instructions (Signed)

## 2020-03-17 ENCOUNTER — Telehealth: Payer: Self-pay

## 2020-03-17 NOTE — Telephone Encounter (Signed)
Denies any needs at this time/ States she is feeling a lot better. Instructed to call if needed

## 2020-04-13 ENCOUNTER — Ambulatory Visit
Payer: Self-pay | Attending: Student in an Organized Health Care Education/Training Program | Admitting: Student in an Organized Health Care Education/Training Program

## 2020-04-13 ENCOUNTER — Encounter: Payer: Self-pay | Admitting: Student in an Organized Health Care Education/Training Program

## 2020-04-13 ENCOUNTER — Other Ambulatory Visit: Payer: Self-pay

## 2020-04-13 VITALS — BP 160/93 | HR 84 | Temp 97.2°F | Resp 18 | Ht 62.0 in | Wt 149.0 lb

## 2020-04-13 DIAGNOSIS — M5412 Radiculopathy, cervical region: Secondary | ICD-10-CM

## 2020-04-13 DIAGNOSIS — G5603 Carpal tunnel syndrome, bilateral upper limbs: Secondary | ICD-10-CM

## 2020-04-13 DIAGNOSIS — M4802 Spinal stenosis, cervical region: Secondary | ICD-10-CM

## 2020-04-13 DIAGNOSIS — M792 Neuralgia and neuritis, unspecified: Secondary | ICD-10-CM

## 2020-04-13 NOTE — Progress Notes (Signed)
Safety precautions to be maintained throughout the outpatient stay will include: orient to surroundings, keep bed in low position, maintain call bell within reach at all times, provide assistance with transfer out of bed and ambulation.  

## 2020-04-13 NOTE — Progress Notes (Signed)
PROVIDER NOTE: Information contained herein reflects review and annotations entered in association with encounter. Interpretation of such information and data should be left to medically-trained personnel. Information provided to patient can be located elsewhere in the medical record under "Patient Instructions". Document created using STT-dictation technology, any transcriptional errors that may result from process are unintentional.    Patient: Jaime Johnson  Service Category: E/M  Provider: Gillis Santa, MD  DOB: 1961-01-22  DOS: 04/13/2020  Specialty: Interventional Pain Management  MRN: [id number]  Setting: Ambulatory outpatient  PCP: Owens Loffler, MD  Type: Established Patient    Referring Provider: Owens Loffler, MD  Location: Office  Delivery: Face-to-face     HPI  Reason for encounter: Jaime Johnson, a 59 y.o. year old female, is here today for evaluation and management of her Cervical radicular pain [M54.12]. Ms. Sylva primary complain today is Back Pain (low), Shoulder Pain (bilateral, right is worse), Arm Pain (bilateral, right is worse), and Hand Pain (bilateral, right is worse) Last encounter: Practice (03/17/2020). My last encounter with her was on 03/16/2020. Pertinent problems: Jaime Johnson has Cervical radicular pain (RIGHT); Bilateral carpal tunnel syndrome; and Foraminal stenosis of cervical region (RIGHT) on their pertinent problem list. Pain Assessment: Severity of Chronic pain is reported as a 2 /10. Location: Shoulder Right, Left (right is worse)/radiates from shoulder to fingertips. Onset: More than a month ago. Quality: Other (Comment), Numbness (stiffness, solid). Timing: Constant. Modifying factor(s): rest. Vitals:  height is _0  (1.575 m) and weight is 149 lb (67.6 kg). Her temperature is 97.2 F (36.2 C) (abnormal). Her blood pressure is 160/93 (abnormal) and her pulse is 84. Her respiration is 18 and oxygen saturation is 100%.    Post-Procedure Evaluation   Procedure:  Type: Diagnostic, Inter-Laminar, Cervical Epidural Steroid Injection  #2  Region: Posterior Cervico-thoracic Region Level: C7-T1 Laterality: Midline Paramedial  Sedation: Please see nurses note.  Effectiveness during initial hour after procedure(Ultra-Short Term Relief): 100 %   Local anesthetic used: Long-acting (4-6 hours) Effectiveness: Defined as any analgesic benefit obtained secondary to the administration of local anesthetics. This carries significant diagnostic value as to the etiological location, or anatomical origin, of the pain. Duration of benefit is expected to coincide with the duration of the local anesthetic used.  Effectiveness during initial 4-6 hours after procedure(Short-Term Relief): 100 %   Long-term benefit: Defined as any relief past the pharmacologic duration of the local anesthetics.  Effectiveness past the initial 6 hours after procedure(Long-Term Relief): 25 % (ongoing)   Current benefits: Defined as benefit that persist at this time.   Analgesia:  <50% better; 25 to 30% Function: Somewhat improved ROM: Somewhat improved   ROS  Constitutional: Denies any fever or chills Gastrointestinal: No reported hemesis, hematochezia, vomiting, or acute GI distress Musculoskeletal: Denies any acute onset joint swelling, redness, loss of ROM, or weakness Neurological: No reported episodes of acute onset apraxia, aphasia, dysarthria, agnosia, amnesia, paralysis, loss of coordination, or loss of consciousness  Medication Review  Brimonidine Tartrate, Multiple Vitamins-Minerals, Propylene Glycol, lisinopril-hydrochlorothiazide, and traMADol  History Review  Allergy: Jaime Johnson is allergic to azithromycin. Drug: Jaime Johnson  reports no history of drug use. Alcohol:  reports no history of alcohol use. Tobacco:  reports that she quit smoking about 41 years ago. She has never used smokeless tobacco. Social: Jaime Johnson  reports that she quit smoking about 41  years ago. She has never used smokeless tobacco. She reports that she does not drink alcohol and does  not use drugs. Medical:  has a past medical history of Allergic rhinitis due to pollen, Anemia, Arthritis, Hyperlipidemia LDL goal <100 (02/16/2020), Hypertension (12/19/2016), and Motion sickness. Surgical: Jaime Johnson  has a past surgical history that includes Cesarean section; Colonoscopy with propofol (N/A, 11/12/2016); polypectomy (11/12/2016); Colonoscopy with propofol (N/A, 03/29/2017); polypectomy (03/29/2017); and Colonoscopy with propofol (N/A, 03/14/2020). Family: family history includes Hepatitis C in her brother; Hepatitis C (age of onset: 51) in her mother; Hypertension in her father.  Laboratory Chemistry Profile   Renal Lab Results  Component Value Date   BUN 10 02/08/2020   CREATININE 0.76 02/08/2020   GFR 77.67 02/08/2020     Hepatic Lab Results  Component Value Date   AST 29 02/08/2020   ALT 42 (H) 02/08/2020   ALBUMIN 4.7 02/08/2020   ALKPHOS 57 02/08/2020     Electrolytes Lab Results  Component Value Date   NA 140 02/08/2020   K 3.8 02/08/2020   CL 104 02/08/2020   CALCIUM 9.6 02/08/2020     Bone No results found for: VD25OH, VD125OH2TOT, [id number], [id number], 25OHVITD1, 25OHVITD2, 25OHVITD3, TESTOFREE, TESTOSTERONE   Inflammation (CRP: Acute Phase) (ESR: Chronic Phase) Lab Results  Component Value Date   ESRSEDRATE 4 12/15/2018       Note: Above Lab results reviewed.  Recent Imaging Review  DG PAIN CLINIC C-ARM 1-60 MIN NO REPORT Fluoro was used, but no Radiologist interpretation will be provided.  Please refer to "NOTES" tab for provider progress note. Note: Reviewed        Physical Exam  General appearance: Well nourished, well developed, and well hydrated. In no apparent acute distress Mental status: Alert, oriented x 3 (person, place, & time)       Respiratory: No evidence of acute respiratory distress Eyes: PERLA Vitals: BP (!) 160/93   Pulse 84    Temp (!) 97.2 F (36.2 C)   Resp 18   Ht _0  (1.575 m)   Wt 149 lb (67.6 kg)   LMP 02/29/2020 (Approximate) Comment: Has had random spotting; hadn't had a period 1.5 yrs prior  SpO2 100%   BMI 27.25 kg/m  BMI: Estimated body mass index is 27.25 kg/m as calculated from the following:   Height as of this encounter: _1  (1.575 m).   Weight as of this encounter: 149 lb (67.6 kg). Ideal: Ideal body weight: 50.1 kg (110 lb 7.2 oz) Adjusted ideal body weight: 57.1 kg (125 lb 13.9 oz) Cervical Spine Area Exam  Skin & Axial Inspection: No masses, redness, edema, swelling, or associated skin lesions Alignment: Symmetrical Functional ROM: Pain restricted ROM, to the right Stability: No instability detected Muscle Tone/Strength: Functionally intact. No obvious neuro-muscular anomalies detected. Sensory (Neurological): Dermatomal pain pattern Palpation: No palpable anomalies             Upper Extremity (UE) Exam    Side: Right upper extremity  Side: Left upper extremity  Skin & Extremity Inspection: Skin color, temperature, and hair growth are WNL. No peripheral edema or cyanosis. No masses, redness, swelling, asymmetry, or associated skin lesions. No contractures.  Skin & Extremity Inspection: Skin color, temperature, and hair growth are WNL. No peripheral edema or cyanosis. No masses, redness, swelling, asymmetry, or associated skin lesions. No contractures.  Functional ROM: Pain restricted ROM for shoulder and elbow  Functional ROM: Unrestricted ROM          Muscle Tone/Strength: Movement possible against some resistance (4/5)  Muscle Tone/Strength: Normal strength (5/5)  Sensory (Neurological):  Dermatomal pain pattern          Sensory (Neurological): Unimpaired          Palpation: No palpable anomalies              Palpation: No palpable anomalies              Provocative Test(s):  Phalen's test: deferred Tinel's test: deferred Apley's scratch test (touch opposite shoulder):  Action 1  (Across chest): deferred Action 2 (Overhead): deferred Action 3 (LB reach): deferred   Provocative Test(s):  Phalen's test: deferred Tinel's test: deferred Apley's scratch test (touch opposite shoulder):  Action 1 (Across chest): deferred Action 2 (Overhead): deferred Action 3 (LB reach): deferred   4 out of 5 strength R  upper extremity: Shoulder abduction, elbow flexion, elbow extension, thumb extension 5 out of 5 strength L upper extremity: Shoulder abduction, elbow flexion, elbow extension, thumb extension. .  Assessment   Status Diagnosis  Persistent Persistent Persistent 1. 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 Cervical radiculopathy   4. Neuropathic pain   5. Bilateral carpal tunnel syndrome      Updated Problems: Problem  Foraminal stenosis of cervical region (RIGHT)  Cervical radicular pain (RIGHT)  Bilateral Carpal Tunnel Syndrome    Plan of Care   Jaime Johnson has a current medication list which includes the following long-term medication(s): lisinopril-hydrochlorothiazide.   Jaime Johnson is a pleasant 59 year old female with right C5-C6 cervical radiculopathy who presents for postprocedural follow-up status post C7-T1 ESI #2 performed on 03/16/2020.  Patient states that the pain relief and functional improvement that she obtained with her second epidural was superior to her first 1.  She states that for the first week to week and a half she had significant pain relief rated as approximately greater than 75% which has now reduced to approximately 25 to 30%.  She does endorse improvement in performing household chores and daily function although she continues to have numbness and burning pain present.  We discussed neck steps which could include neurosurgical consult and/or repeating cervical ESI #3.  The patient states that she is still experiencing functional and pain relief from her second ESI so we will  continue to monitor.  We will place as needed order if patient would like to repeat.  In regards to tramadol, patient does find analgesic benefit with this medication however it results in insomnia.  I recommend the patient take this medication in the morning or in the afternoon for analgesic benefit.  Patient will follow up in 6 weeks.  Orders:  Orders Placed This Encounter  Procedures  . Cervical Epidural Injection    Procedure: Cervical Epidural Steroid Injection/Block Purpose: Diagnostic Indication(s): Radiculitis and/or cervicalgia associater with cervical degenerative disc disease.    Standing Status:   Standing    Number of Occurrences:   6    Standing Expiration Date:   04/13/2021    Scheduling Instructions:     Level(s): C7-T1     Laterality: TBD     Sedation: Patient's choice.     Timeframe: PRN    Order Specific Question:   Where will this procedure be performed?    Answer:   [facility]    Comments:   [place]   Follow-up plan:   Return in about 6 weeks (around 05/25/2020) for Medication Management, in person.   Recent Visits Date Type Provider Dept  03/16/20 Procedure visit  Gillis Santa, MD [facility]  02/22/20 Telemedicine Gillis Santa, MD [facility]  01/25/20 Procedure visit Gillis Santa, MD [facility]  Showing recent visits within past 90 days and meeting all other requirements Today's Visits Date Type Provider Dept  04/13/20 Office Visit Gillis Santa, MD [facility]  Showing today's visits and meeting all other requirements Future Appointments Date Type Provider Dept  05/26/20 Appointment Gillis Santa, MD [facility]  Showing future appointments within next 90 days and meeting all other requirements  I discussed the assessment and treatment plan with the patient. The patient was provided an opportunity to ask questions and all were answered. The patient agreed with the plan and demonstrated an  understanding of the instructions.  Patient advised to call back or seek an in-person evaluation if the symptoms or condition worsens.  Duration of encounter: 65mnutes.  Note by: BGillis Santa MD Date: 04/13/2020; Time: 2:20 PM

## 2020-05-26 ENCOUNTER — Other Ambulatory Visit: Payer: Self-pay

## 2020-05-26 ENCOUNTER — Ambulatory Visit
Payer: Self-pay | Attending: Student in an Organized Health Care Education/Training Program | Admitting: Student in an Organized Health Care Education/Training Program

## 2020-05-26 ENCOUNTER — Encounter: Payer: Self-pay | Admitting: Student in an Organized Health Care Education/Training Program

## 2020-05-26 DIAGNOSIS — M7918 Myalgia, other site: Secondary | ICD-10-CM

## 2020-05-26 DIAGNOSIS — M792 Neuralgia and neuritis, unspecified: Secondary | ICD-10-CM

## 2020-05-26 DIAGNOSIS — M4802 Spinal stenosis, cervical region: Secondary | ICD-10-CM

## 2020-05-26 DIAGNOSIS — M5412 Radiculopathy, cervical region: Secondary | ICD-10-CM

## 2020-05-26 MED ORDER — DIAZEPAM 5 MG PO TABS: 5.0000 mg | ORAL_TABLET | Freq: Every day | ORAL | 0 refills | Status: AC | PRN

## 2020-05-26 NOTE — Progress Notes (Signed)
Patient: Jaime Johnson  Service Category: E/M  Provider: Bilal Lateef, MD  DOB: 09/14/1961  DOS: 05/26/2020  Location: Office  MRN: [id number]  Setting: Ambulatory outpatient  Referring Provider: Copland, Spencer, MD  Type: Established Patient  Specialty: Interventional Pain Management  PCP: Copland, Spencer, MD  Location: Home  Delivery: TeleHealth     Virtual Encounter - Pain Management PROVIDER NOTE: Information contained herein reflects review and annotations entered in association with encounter. Interpretation of such information and data should be left to medically-trained personnel. Information provided to patient can be located elsewhere in the medical record under "Patient Instructions". Document created using STT-dictation technology, any transcriptional errors that may result from process are unintentional.    Contact & Pharmacy Preferred: [phone] Home: [phone] (home) Mobile: [phone] (mobile) E-mail: [email or web address]  [id number] - [place], [place] - [address] [place] [address] Phone: [phone] Fax: [phone]   Pre-screening  Ms. Jaime Johnson offered "in-person" vs "virtual" encounter. She indicated preferring virtual for this encounter.   Reason COVID-19*  Social distancing based on CDC and AMA recommendations.   I contacted [place] on 05/26/2020 via video conference.      I clearly identified myself as Bilal Lateef, MD. I verified that I was speaking with the correct person using two identifiers (Name: Justyna S Searing, and date of birth: 02/21/1961).  Consent I sought verbal advanced consent from Zemirah S Bethards for virtual visit interactions. I informed Ms. Coupland of possible security and privacy concerns, risks, and limitations associated with providing "not-in-person" medical evaluation and management services. I also informed Ms. Tetrault of the availability of "in-person" appointments.  Finally, I informed her that there would be a charge for the virtual visit and that she could be  personally, fully or partially, financially responsible for it. Ms. Doom expressed understanding and agreed to proceed.   Historic Elements   Jaime Johnson is a 59 y.o. year old, female patient evaluated today after her last contact with our practice on 04/13/2020. Jaime Johnson  has a past medical history of Allergic rhinitis due to pollen, Anemia, Arthritis, Hyperlipidemia LDL goal <100 (02/16/2020), Hypertension (12/19/2016), and Motion sickness. She also  has a past surgical history that includes Cesarean section; Colonoscopy with propofol (N/A, 11/12/2016); polypectomy (11/12/2016); Colonoscopy with propofol (N/A, 03/29/2017); polypectomy (03/29/2017); and Colonoscopy with propofol (N/A, 03/14/2020). Ms. Buske has a current medication list which includes the following prescription(s): diazepam, lisinopril-hydrochlorothiazide, lumify, multiple vitamins-minerals, systane complete, and tramadol. She  reports that she quit smoking about 41 years ago. She has never used smokeless tobacco. She reports that she does not drink alcohol and does not use drugs. Jaime Johnson is allergic to azithromycin.   HPI  Today, she is being contacted for medication management.   Since last visit, has been utilizing Tramadol during the day but states that is makes her "too active" and feels that she is drained by the end of the day. States that 2.5 mg Valium QHS is helpful at night Has experienced benefit after her cervical epidural steroid injections for cervical radicular pain Wants to avoid surgery Given improvement in upper extremity pain, states that performing ADLs is a bit easier.  Pharmacotherapy Assessment  Analgesic: 02/15/2020  1   02/15/2020  Tramadol Hcl 50 MG Tablet  30.00  30 Sp Cop   [id number]   Wal ([id number])   0/2  5.00 MME  Comm Ins     [place]     Monitoring: [place] PMP: PDMP reviewed during this encounter.        Pharmacotherapy: No side-effects or adverse reactions reported. Compliance: No problems identified. Effectiveness: Clinically acceptable. Plan: Refer to "POC".  UDS: No results found for: SUMMARY  Laboratory Chemistry Profile   Renal Lab Results  Component Value Date   BUN 10 02/08/2020   CREATININE 0.76 02/08/2020   GFR 77.67 02/08/2020     Hepatic Lab Results  Component Value Date   AST 29 02/08/2020   ALT 42 (H) 02/08/2020   ALBUMIN 4.7 02/08/2020   ALKPHOS 57 02/08/2020     Electrolytes Lab Results  Component Value Date   NA 140 02/08/2020   K 3.8 02/08/2020   CL 104 02/08/2020   CALCIUM 9.6 02/08/2020     Bone No results found for: VD25OH, VD125OH2TOT, [id number], [id number], 25OHVITD1, 25OHVITD2, 25OHVITD3, TESTOFREE, TESTOSTERONE   Inflammation (CRP: Acute Phase) (ESR: Chronic Phase) Lab Results  Component Value Date   ESRSEDRATE 4 12/15/2018       Note: Above Lab results reviewed.    Assessment  The primary encounter diagnosis was Cervical radicular pain (RIGHT C4/5 and C5/6). Diagnoses of Foraminal stenosis of cervical region (RIGHT), Cervical radiculopathy, Neuropathic pain, and Cervical myofascial pain syndrome were also pertinent to this visit.  Plan of Care  Jaime Johnson has a current medication list which includes the following long-term medication(s): lisinopril-hydrochlorothiazide.  Pharmacotherapy (Medications Ordered): Meds ordered this encounter  Medications  . diazepam (VALIUM) 5 MG tablet    Sig: Take 1 tablet (5 mg total) by mouth daily as needed for muscle spasms.    Dispense:  30 tablet    Refill:  0   Orders:  Orders Placed This Encounter  Procedures  . Cervical Epidural Injection    Procedure: Cervical Epidural Steroid Injection/Block Purpose: Diagnostic Indication(s): Radiculitis and/or cervicalgia associater with cervical degenerative disc disease.    Standing Status:   Standing    Number of Occurrences:   6     Standing Expiration Date:   05/26/2021    Scheduling Instructions:     Level(s): C7-T1     Laterality: TBD     Sedation: Patient's choice.     Timeframe: PRN    Order Specific Question:   Where will this procedure be performed?    Answer:   [facility]    Comments:   [facility]   Follow-up plan:   Return if symptoms worsen or fail to improve.   Recent Visits Date Type Provider Dept  04/13/20 Office Visit Gillis Santa, MD [facility]  03/16/20 Procedure visit Gillis Santa, MD [facility]  Showing recent visits within past 90 days and meeting all other requirements Today's Visits Date Type Provider Dept  05/26/20 Telemedicine Gillis Santa, MD [facility]  Showing today's visits and meeting all other requirements Future Appointments No visits were found meeting these conditions. Showing future appointments within next 90 days and meeting all other requirements  I discussed the assessment and treatment plan with the patient. The patient was provided an opportunity to ask questions and all were answered. The patient agreed with the plan and demonstrated an understanding of the instructions.  Patient advised to call back or seek an in-person evaluation if the symptoms or condition worsens.  Duration of encounter: 35 minutes.  Note by: Gillis Santa, MD Date: 05/26/2020; Time: 2:33 PM

## 2020-10-22 DIAGNOSIS — U071 COVID-19: Secondary | ICD-10-CM

## 2020-10-22 HISTORY — DX: COVID-19: U07.1

## 2020-12-02 ENCOUNTER — Other Ambulatory Visit: Payer: Self-pay | Admitting: Family Medicine

## 2020-12-02 NOTE — Telephone Encounter (Signed)
Last office visit 02/15/2020 fpr CPE.  Last refilled 02/15/2020 for #30 with 2 refills.  No future appointments.

## 2021-02-27 ENCOUNTER — Other Ambulatory Visit: Payer: Self-pay | Admitting: Family Medicine

## 2021-02-27 ENCOUNTER — Other Ambulatory Visit: Payer: Self-pay | Admitting: Student in an Organized Health Care Education/Training Program

## 2021-02-27 DIAGNOSIS — M7918 Myalgia, other site: Secondary | ICD-10-CM

## 2021-02-27 DIAGNOSIS — M5412 Radiculopathy, cervical region: Secondary | ICD-10-CM

## 2021-02-27 DIAGNOSIS — M4802 Spinal stenosis, cervical region: Secondary | ICD-10-CM

## 2021-02-27 NOTE — Telephone Encounter (Signed)
Left voice message to call the office  

## 2021-02-27 NOTE — Telephone Encounter (Signed)
Please schedule CPE with fasting labs prior for Dr. Copland.  

## 2021-03-03 ENCOUNTER — Other Ambulatory Visit: Payer: Self-pay | Admitting: Family Medicine

## 2021-03-03 DIAGNOSIS — Z114 Encounter for screening for human immunodeficiency virus [HIV]: Secondary | ICD-10-CM

## 2021-03-03 DIAGNOSIS — Z13220 Encounter for screening for lipoid disorders: Secondary | ICD-10-CM

## 2021-03-03 DIAGNOSIS — Z79899 Other long term (current) drug therapy: Secondary | ICD-10-CM

## 2021-03-03 DIAGNOSIS — Z131 Encounter for screening for diabetes mellitus: Secondary | ICD-10-CM

## 2021-03-14 ENCOUNTER — Other Ambulatory Visit: Payer: Self-pay

## 2021-03-14 ENCOUNTER — Other Ambulatory Visit (INDEPENDENT_AMBULATORY_CARE_PROVIDER_SITE_OTHER): Payer: Self-pay

## 2021-03-14 DIAGNOSIS — Z79899 Other long term (current) drug therapy: Secondary | ICD-10-CM

## 2021-03-14 DIAGNOSIS — Z13220 Encounter for screening for lipoid disorders: Secondary | ICD-10-CM

## 2021-03-14 DIAGNOSIS — Z114 Encounter for screening for human immunodeficiency virus [HIV]: Secondary | ICD-10-CM

## 2021-03-14 DIAGNOSIS — Z131 Encounter for screening for diabetes mellitus: Secondary | ICD-10-CM

## 2021-03-14 NOTE — Addendum Note (Signed)
Addended by: Ellamae Sia on: 03/14/2021 09:46 AM   Modules accepted: Orders

## 2021-03-15 LAB — BASIC METABOLIC PANEL
BUN: 16 mg/dL (ref 7–25)
CO2: 29 mmol/L (ref 20–32)
Calcium: 9.6 mg/dL (ref 8.6–10.4)
Chloride: 104 mmol/L (ref 98–110)
Creat: 0.86 mg/dL (ref 0.50–0.99)
Glucose, Bld: 107 mg/dL — ABNORMAL HIGH (ref 65–99)
Potassium: 4.2 mmol/L (ref 3.5–5.3)
Sodium: 142 mmol/L (ref 135–146)

## 2021-03-15 LAB — CBC WITH DIFFERENTIAL/PLATELET
Absolute Monocytes: 293 cells/uL (ref 200–950)
Basophils Absolute: 48 cells/uL (ref 0–200)
Basophils Relative: 1 %
Eosinophils Absolute: 48 cells/uL (ref 15–500)
Eosinophils Relative: 1 %
HCT: 43.8 % (ref 35.0–45.0)
Hemoglobin: 14.5 g/dL (ref 11.7–15.5)
Lymphs Abs: 1253 cells/uL (ref 850–3900)
MCH: 27.1 pg (ref 27.0–33.0)
MCHC: 33.1 g/dL (ref 32.0–36.0)
MCV: 81.9 fL (ref 80.0–100.0)
MPV: 8.7 fL (ref 7.5–12.5)
Monocytes Relative: 6.1 %
Neutro Abs: 3158 cells/uL (ref 1500–7800)
Neutrophils Relative %: 65.8 %
Platelets: 290 10*3/uL (ref 140–400)
RBC: 5.35 10*6/uL — ABNORMAL HIGH (ref 3.80–5.10)
RDW: 12.8 % (ref 11.0–15.0)
Total Lymphocyte: 26.1 %
WBC: 4.8 10*3/uL (ref 3.8–10.8)

## 2021-03-15 LAB — HEPATIC FUNCTION PANEL
AG Ratio: 1.4 (calc) (ref 1.0–2.5)
ALT: 31 U/L — ABNORMAL HIGH (ref 6–29)
AST: 24 U/L (ref 10–35)
Albumin: 4.5 g/dL (ref 3.6–5.1)
Alkaline phosphatase (APISO): 50 U/L (ref 37–153)
Bilirubin, Direct: 0.1 mg/dL (ref 0.0–0.2)
Globulin: 3.3 g/dL (calc) (ref 1.9–3.7)
Indirect Bilirubin: 0.5 mg/dL (calc) (ref 0.2–1.2)
Total Bilirubin: 0.6 mg/dL (ref 0.2–1.2)
Total Protein: 7.8 g/dL (ref 6.1–8.1)

## 2021-03-15 LAB — LIPID PANEL
Cholesterol: 265 mg/dL — ABNORMAL HIGH (ref <200)
HDL: 59 mg/dL (ref >=50)
LDL Cholesterol (Calc): 176 mg/dL (calc) — ABNORMAL HIGH
Non-HDL Cholesterol (Calc): 206 mg/dL (calc) — ABNORMAL HIGH (ref <130)
Total CHOL/HDL Ratio: 4.5 (calc) (ref <5.0)
Triglycerides: 151 mg/dL — ABNORMAL HIGH (ref <150)

## 2021-03-15 LAB — HEMOGLOBIN A1C
Hgb A1c MFr Bld: 5.3 % of total Hgb (ref <5.7)
Mean Plasma Glucose: 105 mg/dL
eAG (mmol/L): 5.8 mmol/L

## 2021-03-15 LAB — TSH: TSH: 3.15 mIU/L (ref 0.40–4.50)

## 2021-03-15 LAB — HIV ANTIBODY (ROUTINE TESTING W REFLEX): HIV 1&2 Ab, 4th Generation: NONREACTIVE

## 2021-03-21 ENCOUNTER — Encounter: Payer: Self-pay | Admitting: Family Medicine

## 2021-03-21 NOTE — Progress Notes (Signed)
Coen Miyasato T. Jatorian Renault, MD, [place] at [facility] [place], [address]  Phone: [phone]  FAX: [place] - 60 y.o. female  MRN [id number]  Date of Birth: 1960/12/30  Date: 03/22/2021  PCP: Owens Loffler, MD  Referral: Owens Loffler, MD  Chief Complaint  Patient presents with  . Annual Exam    This visit occurred during the SARS-CoV-2 public health emergency.  Safety protocols were in place, including screening questions prior to the visit, additional usage of staff PPE, and extensive cleaning of exam room while observing appropriate contact time as indicated for disinfecting solutions.   Patient Care Team: Owens Loffler, MD as PCP - General (Family Medicine) Rico Junker, RN as Registered Nurse Theodore Demark, RN as Registered Nurse Subjective:   Jaime Johnson is a 60 y.o. pleasant patient who presents with the following:  Health Maintenance Summary Reviewed and updated, unless pt declines services.  Tobacco History Reviewed. Non-smoker Alcohol: No concerns, no excessive use Exercise Habits: Some activity, rec at least 30 mins 5 times a week, she is limited somewhat by pain. STD concerns: none Drug Use: None Lumps or breast concerns: no  Mammogram Recent pap?  Has put it off, had a biopsy.  Went 3-4 years ago.  She has been reluctant to go back.  She tells me that this was precancerous. Covid #3 - on the second vaccine.  Felt sick.  Does not want to do it.  Saw Reche Dixon about 15 years ago.   Lipids:  Start crestor Lab Results  Component Value Date   CHOL 265 (H) 03/14/2021   CHOL 287 (H) 02/08/2020   CHOL 224 (H) 10/27/2018   Lab Results  Component Value Date   HDL 59 03/14/2021   HDL 61.20 02/08/2020   HDL 52.80 10/27/2018   Lab Results  Component Value Date   LDLCALC 176 (H) 03/14/2021   LDLCALC 188 (H) 02/08/2020    LDLCALC 138 (H) 10/27/2018   Lab Results  Component Value Date   TRIG 151 (H) 03/14/2021   TRIG 188.0 (H) 02/08/2020   TRIG 165.0 (H) 10/27/2018   Lab Results  Component Value Date   CHOLHDL 4.5 03/14/2021   CHOLHDL 5 02/08/2020   CHOLHDL 4 10/27/2018   Lab Results  Component Value Date   LDLDIRECT 128.0 05/26/2014   LDLDIRECT 127.0 10/06/2012    The 10-year ASCVD risk score Mikey Bussing DC Jr., et al., 2013) is: 5.1%   Values used to calculate the score:     Age: 53 years     Sex: Female     Is Non-Hispanic African American: No     Diabetic: No     Tobacco smoker: No     Systolic Blood Pressure: 175 mmHg     Is BP treated: Yes     HDL Cholesterol: 59 mg/dL     Total Cholesterol: 265 mg/dL   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  She is unable to make a full composite fist.  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  No rheumatological component.  Health Maintenance  Topic Date Due  . MAMMOGRAM  01/17/2019  . PAP SMEAR-Modifier  10/24/2019  . COVID-19 Vaccine (3 - Booster) 04/07/2021 (Originally 05/27/2020)  .  INFLUENZA VACCINE  05/22/2021  . TETANUS/TDAP  07/03/2023  . COLONOSCOPY (Pts 45-54yrs Insurance coverage will need to be confirmed)  03/14/2025  . Hepatitis C Screening  Completed  . HIV Screening  Completed  . Zoster Vaccines- Shingrix  Completed  . HPV VACCINES  Aged Out    Immunization History  Administered Date(s) Administered  . Influenza Inj Mdck Quad Pf 09/24/2019  . Influenza, Seasonal, Injecte, Preservative Fre 07/24/2016  . Moderna Sars-Covid-2 Vaccination 12/05/2019, 12/26/2019  . Pneumococcal Polysaccharide-23 09/21/2018  . Tdap 07/02/2013  . Zoster Recombinat (Shingrix) 06/23/2018, 09/15/2018   Patient Active Problem List   Diagnosis Date Noted  . Foraminal stenosis of cervical region (RIGHT) 03/16/2020   . Personal history of colonic polyps   . Hyperlipidemia LDL goal <100 02/16/2020  . Cervical radicular pain (RIGHT) 09/29/2019  . Bilateral carpal tunnel syndrome 09/29/2019  . Hypertension 12/19/2016  . Allergic rhinitis due to pollen     Past Medical History:  Diagnosis Date  . Allergic rhinitis due to pollen   . Hyperlipidemia LDL goal <100 02/16/2020  . Hypertension 12/19/2016    Past Surgical History:  Procedure Laterality Date  . CESAREAN SECTION    . COLONOSCOPY WITH PROPOFOL N/A 11/12/2016   Procedure: COLONOSCOPY WITH PROPOFOL;  Surgeon: Lucilla Lame, MD;  Location: [place];  Service: Endoscopy;  Laterality: N/A;  . COLONOSCOPY WITH PROPOFOL N/A 03/29/2017   Procedure: COLONOSCOPY WITH PROPOFOL;  Surgeon: Lucilla Lame, MD;  Location: [place];  Service: Endoscopy;  Laterality: N/A;  . COLONOSCOPY WITH PROPOFOL N/A 03/14/2020   Procedure: COLONOSCOPY WITH PROPOFOL;  Surgeon: Lucilla Lame, MD;  Location: [place];  Service: Endoscopy;  Laterality: N/A;  priority 4  . POLYPECTOMY  11/12/2016   Procedure: POLYPECTOMY;  Surgeon: Lucilla Lame, MD;  Location: [place];  Service: Endoscopy;;  . POLYPECTOMY  03/29/2017   Procedure: POLYPECTOMY;  Surgeon: Lucilla Lame, MD;  Location: [place];  Service: Endoscopy;;    Family History  Problem Relation Age of Onset  . Hepatitis C Mother 62       d/c at 27  . Hepatitis C Brother   . Hypertension Father     Past Medical History, Surgical History, Social History, Family History, Problem List, Medications, and Allergies have been reviewed and updated if relevant.  Review of Systems: Pertinent positives are listed above.  Otherwise, a full 14 point review of systems has been done in full and it is negative except where it is noted positive.  Objective:   BP 126/78   Pulse 67   Temp 97.8 F (36.6 C) (Temporal)   Ht 5\' 2"  (1.575 m)   Wt 151 lb 12.8 oz (68.9 kg)   LMP 02/29/2020  (Approximate) Comment: Has had random spotting; hadn't had a period 1.5 yrs prior  SpO2 98%   BMI 27.76 kg/m  Ideal Body Weight: Weight in (lb) to have BMI = 25: 136.4  Hearing Screening   125Hz  250Hz  500Hz  1000Hz  2000Hz  3000Hz  4000Hz  6000Hz  8000Hz   Right ear:   20 20 20  20     Left ear:   20 20 20  20     Vision Screening Comments: Patient wears glasses  Depression screen [facility] 2/9 04/13/2020 09/28/2019 11/10/2018  Decreased Interest 0 0 0  Down, Depressed, Hopeless 0 0 0  PHQ - 2 Score 0 0 0     GEN: well developed, well nourished, no acute distress Eyes: conjunctiva and lids normal, PERRLA, EOMI ENT: TM  clear, nares clear, oral exam WNL Neck: supple, no lymphadenopathy, no thyromegaly, no JVD Pulm: clear to auscultation and percussion, respiratory effort normal CV: regular rate and rhythm, S1-S2, no murmur, rub or gallop, no bruits Chest: no scars, masses, no lumps BREAST: breast exam declined GI: soft, non-tender; no hepatosplenomegaly, masses; active bowel sounds all quadrants GU: GU exam declined Lymph: no cervical, axillary or inguinal adenopathy MSK: gait normal, muscle tone and strength WNL  She has diffuse swelling and arthritic changes in virtually all joints of the right and left hand including PIP, DIP, and MCP joints.  She is unable to make a complete composite fist with either hand, worse on the right.  She also has decreased grip strength.  I do not appreciate any ongoing triggering or Dupuytren's contractures.  SKIN: clear, good turgor, color WNL, no rashes, lesions, or ulcerations Neuro: normal mental status, normal strength, sensation, and motion Psych: alert; oriented to person, place and time, normally interactive and not anxious or depressed in appearance.   All labs reviewed with patient. Results for orders placed or performed in visit on 03/14/21  Lipid panel  Result Value Ref Range   Cholesterol 265 (H) <200 mg/dL   HDL 59 > OR = 50 mg/dL   Triglycerides  151 (H) <150 mg/dL   LDL Cholesterol (Calc) 176 (H) mg/dL (calc)   Total CHOL/HDL Ratio 4.5 <5.0 (calc)   Non-HDL Cholesterol (Calc) 206 (H) <130 mg/dL (calc)  Hemoglobin A1c  Result Value Ref Range   Hgb A1c MFr Bld 5.3 <5.7 % of total Hgb   Mean Plasma Glucose 105 mg/dL   eAG (mmol/L) 5.8 mmol/L  CBC with Differential/Platelet  Result Value Ref Range   WBC 4.8 3.8 - 10.8 Thousand/uL   RBC 5.35 (H) 3.80 - 5.10 Million/uL   Hemoglobin 14.5 11.7 - 15.5 g/dL   HCT 43.8 35.0 - 45.0 %   MCV 81.9 80.0 - 100.0 fL   MCH 27.1 27.0 - 33.0 pg   MCHC 33.1 32.0 - 36.0 g/dL   RDW 12.8 11.0 - 15.0 %   Platelets 290 140 - 400 Thousand/uL   MPV 8.7 7.5 - 12.5 fL   Neutro Abs 3,158 1,500 - 7,800 cells/uL   Lymphs Abs 1,253 850 - 3,900 cells/uL   Absolute Monocytes 293 200 - 950 cells/uL   Eosinophils Absolute 48 15 - 500 cells/uL   Basophils Absolute 48 0 - 200 cells/uL   Neutrophils Relative % 65.8 %   Total Lymphocyte 26.1 %   Monocytes Relative 6.1 %   Eosinophils Relative 1.0 %   Basophils Relative 1.0 %  Hepatic function panel  Result Value Ref Range   Total Protein 7.8 6.1 - 8.1 g/dL   Albumin 4.5 3.6 - 5.1 g/dL   Globulin 3.3 1.9 - 3.7 g/dL (calc)   AG Ratio 1.4 1.0 - 2.5 (calc)   Total Bilirubin 0.6 0.2 - 1.2 mg/dL   Bilirubin, Direct 0.1 0.0 - 0.2 mg/dL   Indirect Bilirubin 0.5 0.2 - 1.2 mg/dL (calc)   Alkaline phosphatase (APISO) 50 37 - 153 U/L   AST 24 10 - 35 U/L   ALT 31 (H) 6 - 29 U/L  Basic metabolic panel  Result Value Ref Range   Glucose, Bld 107 (H) 65 - 99 mg/dL   BUN 16 7 - 25 mg/dL   Creat 0.86 0.50 - 0.99 mg/dL   BUN/Creatinine Ratio NOT APPLICABLE 6 - 22 (calc)   Sodium 142 135 - 146 mmol/L  Potassium 4.2 3.5 - 5.3 mmol/L   Chloride 104 98 - 110 mmol/L   CO2 29 20 - 32 mmol/L   Calcium 9.6 8.6 - 10.4 mg/dL  TSH  Result Value Ref Range   TSH 3.15 0.40 - 4.50 mIU/L  HIV Antibody (routine testing w rflx)  Result Value Ref Range   HIV 1&2 Ab, 4th  Generation NON-REACTIVE NON-REACTIVE   No results found.  Assessment and Plan:     ICD-10-CM   1. Healthcare maintenance  Z00.00   2. Hand arthritis  M19.049 Ambulatory referral to Hand Surgery  3. Decreased range of motion of fingers of both hands  M25.641 Ambulatory referral to Hand Surgery   M25.642    Her cholesterol is very high.  We talked about risks and benefits, and working to start her on some Crestor.  Refilled all other medications including tramadol and occasional Valium use.  She has some of the most extensive hand arthritis that I can recall at age.  She did have a mildly elevated ANA and an elevated double-stranded DNA, but rheumatology felt that this was not a rheumatological process.  Negative she might benefit from talking to hand surgery to talk about options.  I think doing small joint replacement when she is so extensive would be challenging.  She is not going to get any more COVID-vaccine  Health Maintenance Exam: The patient's preventative maintenance and recommended screening tests for an annual wellness exam were reviewed in full today. Brought up to date unless services declined.  Counselled on the importance of diet, exercise, and its role in overall health and mortality. The patient's FH and SH was reviewed, including their home life, tobacco status, and drug and alcohol status.  Follow-up in 1 year for physical exam or additional follow-up below.  Follow-up: No follow-ups on file. Or follow-up in 1 year if not noted.  No future appointments.  Meds ordered this encounter  Medications  . rosuvastatin (CRESTOR) 10 MG tablet    Sig: Take 1 tablet (10 mg total) by mouth daily.    Dispense:  90 tablet    Refill:  3  . lisinopril-hydrochlorothiazide (ZESTORETIC) 10-12.5 MG tablet    Sig: Take 1 tablet by mouth daily.    Dispense:  90 tablet    Refill:  3  . traMADol (ULTRAM) 50 MG tablet    Sig: Take 1 tablet (50 mg total) by mouth daily.     Dispense:  30 tablet    Refill:  3  . diazepam (VALIUM) 5 MG tablet    Sig: Take 1 tablet (5 mg total) by mouth every 6 (six) hours as needed for anxiety.    Dispense:  30 tablet    Refill:  1   Medications Discontinued During This Encounter  Medication Reason  . traMADol (ULTRAM) 50 MG tablet Reorder  . lisinopril-hydrochlorothiazide (ZESTORETIC) 10-12.5 MG tablet Reorder  . diazepam (VALIUM) 5 MG tablet Reorder   Orders Placed This Encounter  Procedures  . Ambulatory referral to Hand Surgery    Signed,  Frederico Hamman T. Lauren Aguayo, MD   Allergies as of 03/22/2021      Reactions   Azithromycin Other (See Comments)   Gets a bad yeast infection from this.      Medication List       Accurate as of March 22, 2021 11:59 PM. If you have any questions, ask your nurse or doctor.        diazepam 5 MG tablet Commonly known as:  VALIUM Take 1 tablet (5 mg total) by mouth every 6 (six) hours as needed for anxiety.   lisinopril-hydrochlorothiazide 10-12.5 MG tablet Commonly known as: ZESTORETIC Take 1 tablet by mouth daily.   Lumify 0.025 % Soln Generic drug: Brimonidine Tartrate Apply 1 drop to eye as needed.   MULTIVITAMIN GUMMIES ADULTS PO Take by mouth daily.   rosuvastatin 10 MG tablet Commonly known as: Crestor Take 1 tablet (10 mg total) by mouth daily. Started by: Owens Loffler, MD   Systane Complete 0.6 % Soln Generic drug: Propylene Glycol   traMADol 50 MG tablet Commonly known as: ULTRAM Take 1 tablet (50 mg total) by mouth daily.

## 2021-03-22 ENCOUNTER — Other Ambulatory Visit: Payer: Self-pay

## 2021-03-22 ENCOUNTER — Ambulatory Visit (INDEPENDENT_AMBULATORY_CARE_PROVIDER_SITE_OTHER): Payer: Self-pay | Admitting: Family Medicine

## 2021-03-22 ENCOUNTER — Encounter: Payer: Self-pay | Admitting: Family Medicine

## 2021-03-22 VITALS — BP 126/78 | HR 67 | Temp 97.8°F | Ht 62.0 in | Wt 151.8 lb

## 2021-03-22 DIAGNOSIS — Z0000 Encounter for general adult medical examination without abnormal findings: Secondary | ICD-10-CM

## 2021-03-22 DIAGNOSIS — M25642 Stiffness of left hand, not elsewhere classified: Secondary | ICD-10-CM

## 2021-03-22 DIAGNOSIS — M19049 Primary osteoarthritis, unspecified hand: Secondary | ICD-10-CM

## 2021-03-22 DIAGNOSIS — M25641 Stiffness of right hand, not elsewhere classified: Secondary | ICD-10-CM

## 2021-03-22 MED ORDER — TRAMADOL HCL 50 MG PO TABS: 50.0000 mg | ORAL_TABLET | Freq: Every day | ORAL | 3 refills | Status: DC

## 2021-03-22 MED ORDER — LISINOPRIL-HYDROCHLOROTHIAZIDE 10-12.5 MG PO TABS: 1.0000 | ORAL_TABLET | Freq: Every day | ORAL | 3 refills | Status: DC

## 2021-03-22 MED ORDER — DIAZEPAM 5 MG PO TABS: 5.0000 mg | ORAL_TABLET | Freq: Four times a day (QID) | ORAL | 1 refills | Status: DC | PRN

## 2021-03-22 MED ORDER — ROSUVASTATIN CALCIUM 10 MG PO TABS: 10.0000 mg | ORAL_TABLET | Freq: Every day | ORAL | 3 refills | Status: DC

## 2021-04-25 ENCOUNTER — Other Ambulatory Visit: Payer: Self-pay | Admitting: Internal Medicine

## 2021-04-25 DIAGNOSIS — M1990 Unspecified osteoarthritis, unspecified site: Secondary | ICD-10-CM

## 2021-05-09 ENCOUNTER — Ambulatory Visit
Admission: RE | Admit: 2021-05-09 | Discharge: 2021-05-09 | Disposition: A | Discharge status: Home / Self Care | Payer: Disability Insurance | Source: Ambulatory Visit | Attending: Internal Medicine | Admitting: Internal Medicine

## 2021-05-09 ENCOUNTER — Ambulatory Visit
Admission: RE | Admit: 2021-05-09 | Discharge: 2021-05-09 | Disposition: A | Discharge status: Home / Self Care | Payer: Disability Insurance | Attending: Internal Medicine | Admitting: Internal Medicine

## 2021-05-09 DIAGNOSIS — M1990 Unspecified osteoarthritis, unspecified site: Secondary | ICD-10-CM | POA: Insufficient documentation

## 2021-05-25 ENCOUNTER — Other Ambulatory Visit: Payer: Self-pay | Admitting: Family Medicine

## 2021-05-25 DIAGNOSIS — Z1231 Encounter for screening mammogram for malignant neoplasm of breast: Secondary | ICD-10-CM

## 2021-06-05 ENCOUNTER — Ambulatory Visit
Admission: RE | Admit: 2021-06-05 | Discharge: 2021-06-05 | Disposition: A | Discharge status: Home / Self Care | Payer: Self-pay | Source: Ambulatory Visit | Attending: Family Medicine | Admitting: Family Medicine

## 2021-06-05 ENCOUNTER — Other Ambulatory Visit: Payer: Self-pay

## 2021-06-05 DIAGNOSIS — Z1231 Encounter for screening mammogram for malignant neoplasm of breast: Secondary | ICD-10-CM | POA: Insufficient documentation

## 2021-06-05 HISTORY — DX: Malignant neoplasm of colon, unspecified: C18.9

## 2021-07-23 IMAGING — CR DG HAND 2V*L*
2 series · 2 of 2 positions shown · non-contrast
Comparison: None.

CLINICAL DATA: Arthritis

EXAM:
LEFT HAND - 2 VIEW

[hand ap]
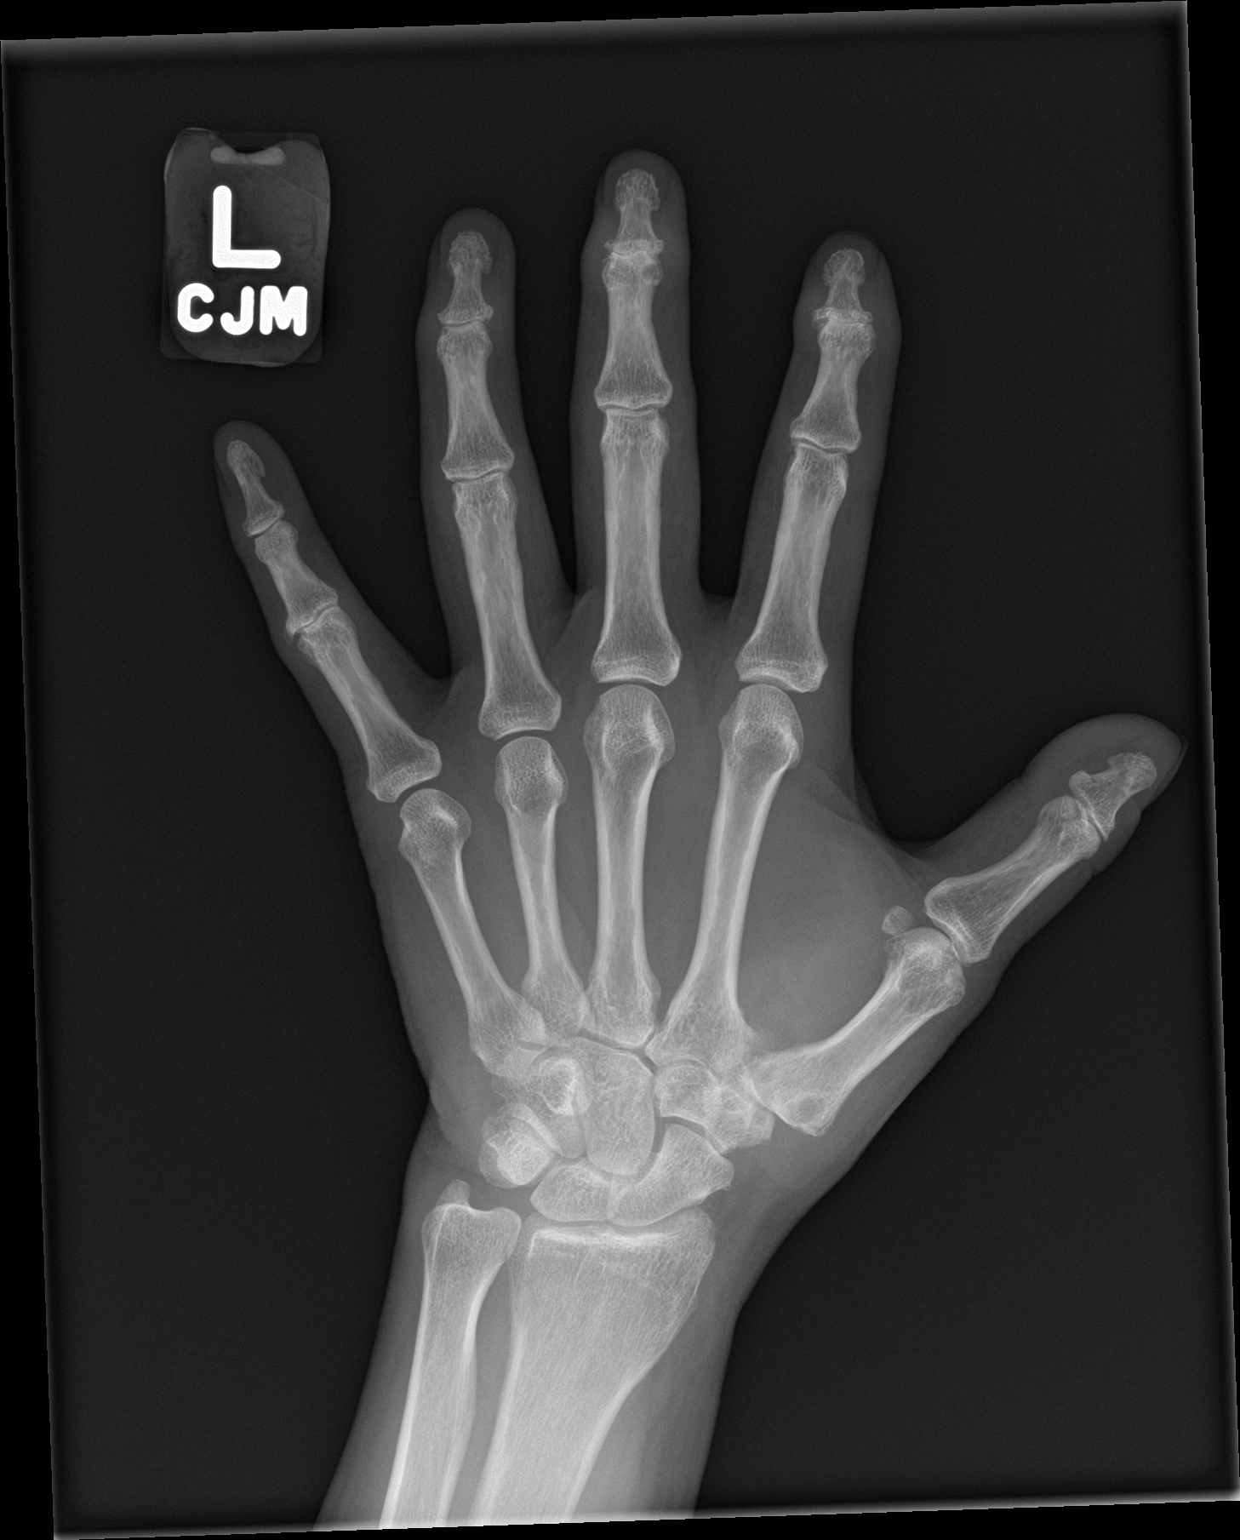

[hand lat]
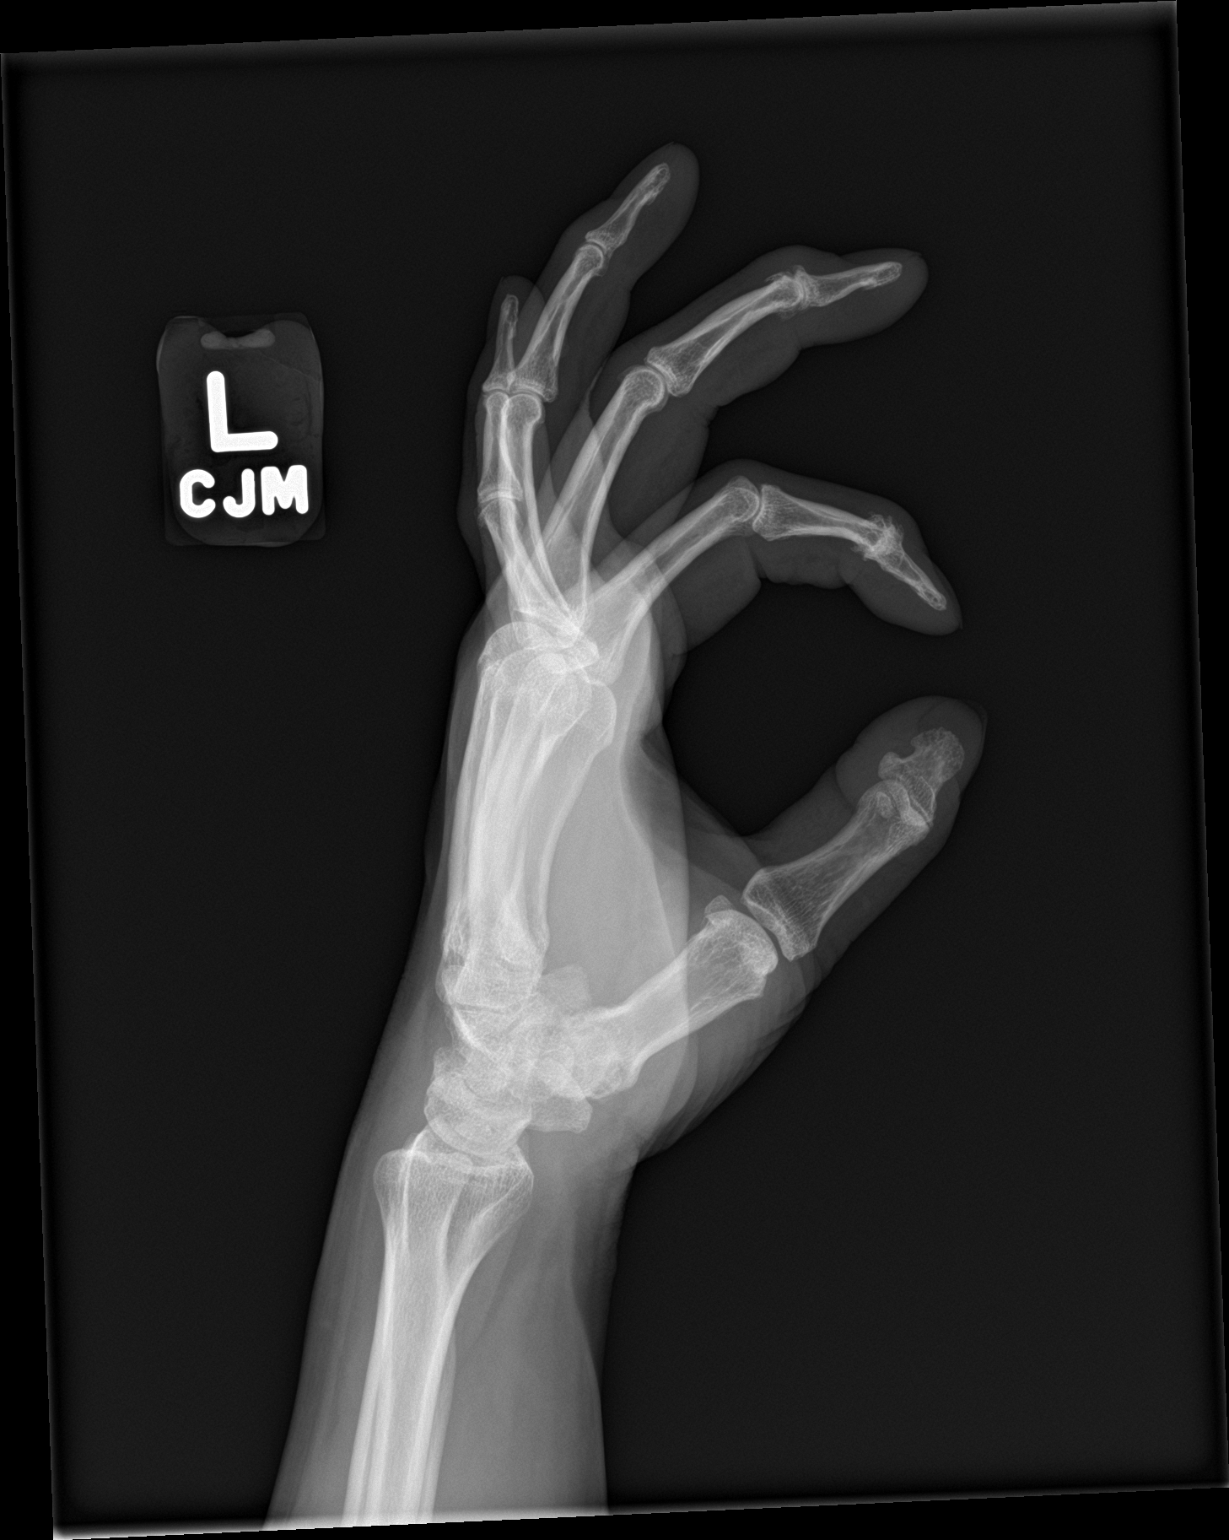

[2 of 2 positions shown; findings below may reference images not displayed]

FINDINGS: No fracture or malalignment. Moderate joint space narrowing and
osteophyte at the second and third D IP joints. Moderate arthritis
at the first CMC joint. Digital soft tissue swelling
IMPRESSION: Soft tissue swelling with arthritis

## 2021-07-23 IMAGING — CR DG KNEE 1-2V*R*
2 series · 2 of 2 positions shown · non-contrast
Comparison: Report 04/13/2019

CLINICAL DATA: Arthritis

EXAM:
RIGHT KNEE - 1-2 VIEW

[knee ap]
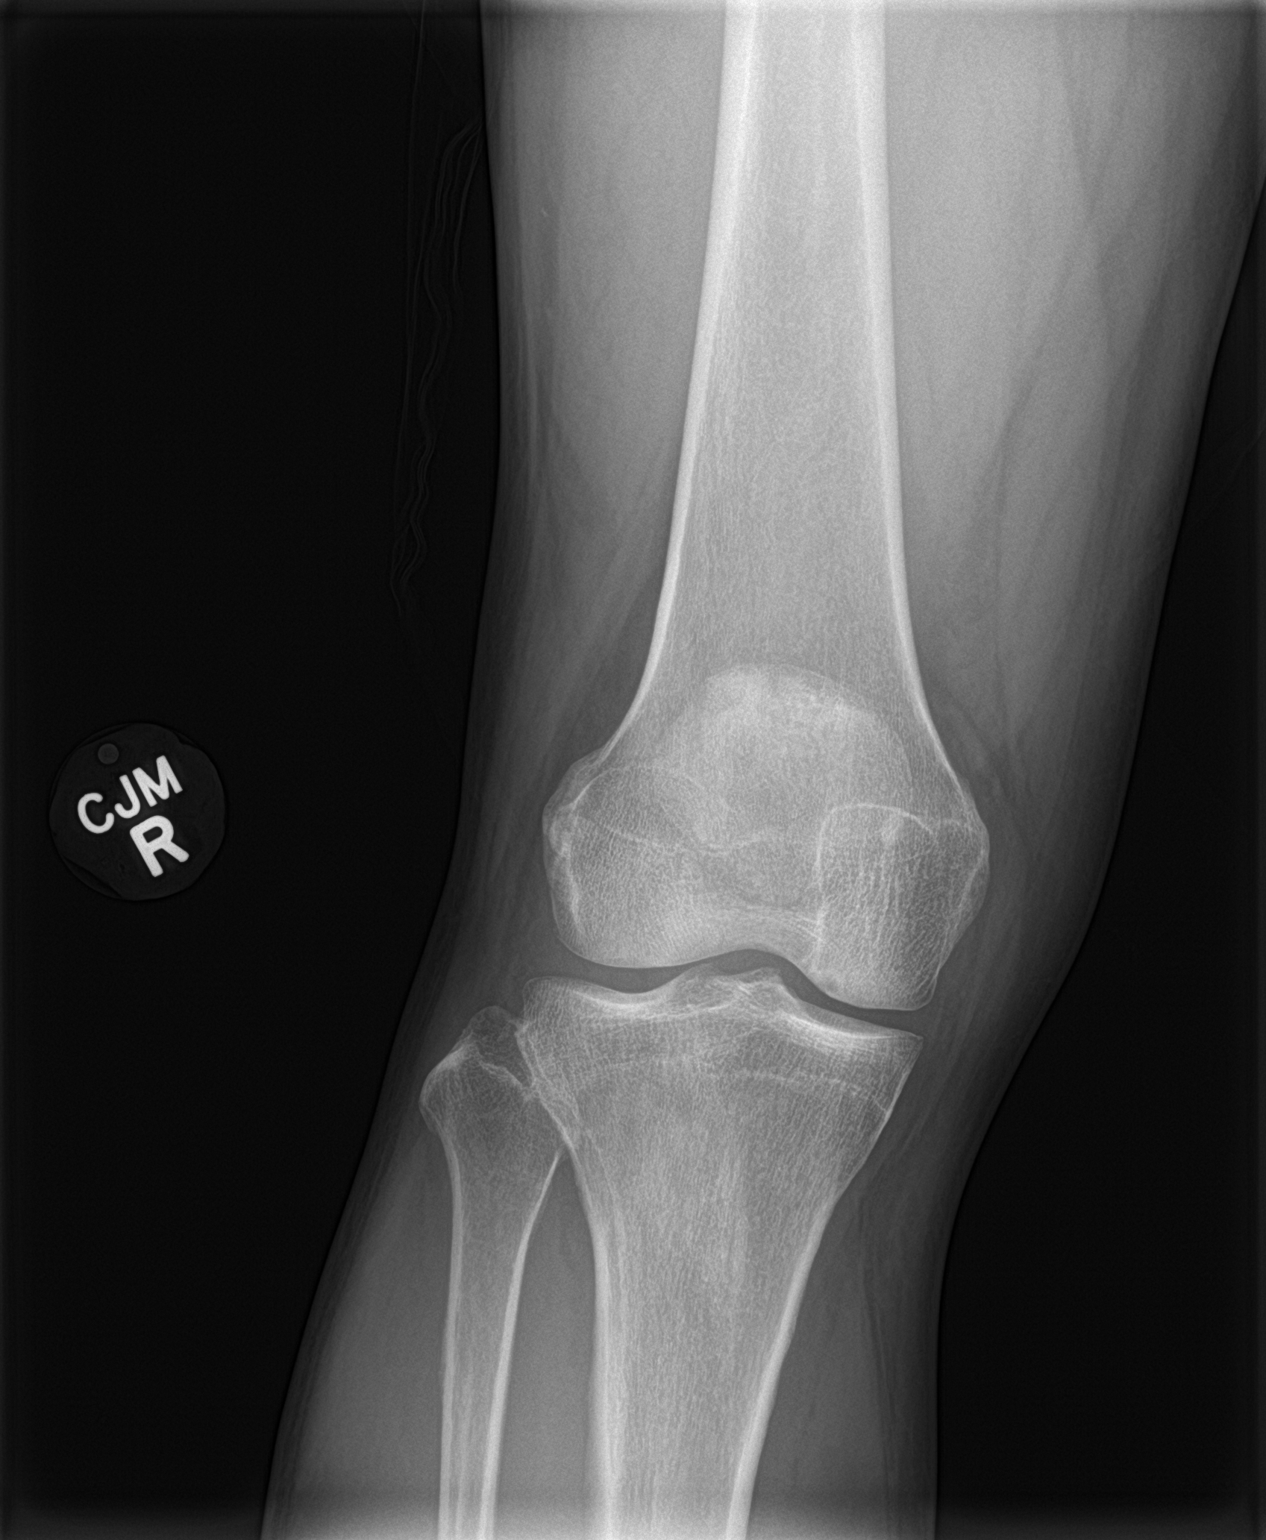

[knee lat]
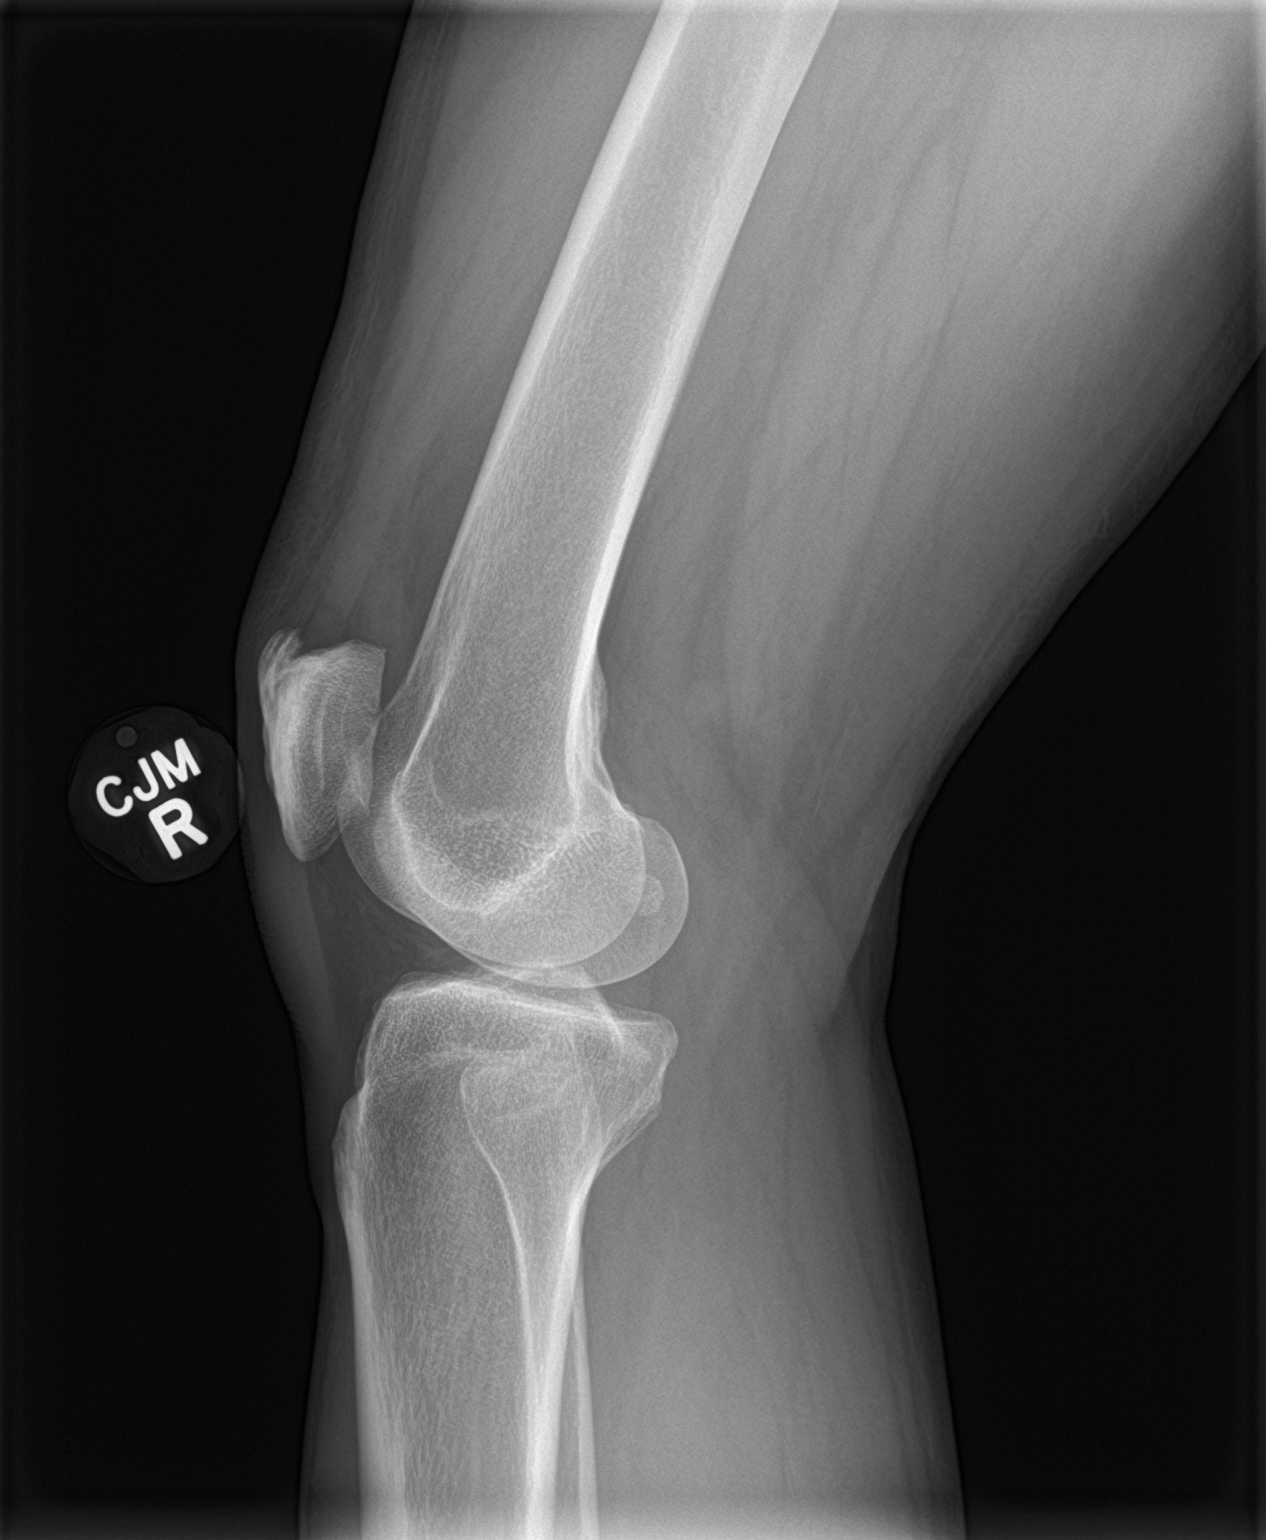

[2 of 2 positions shown; findings below may reference images not displayed]

FINDINGS: No fracture or malalignment. Minimal patellofemoral degenerative
change with trace effusion
IMPRESSION: Minimal patellofemoral degenerative change.  Trace effusion

## 2021-07-23 IMAGING — CR DG KNEE 1-2V*L*
2 series · 2 of 2 positions shown · non-contrast
Comparison: Report 04/13/2019

CLINICAL DATA: Arthritis

EXAM:
LEFT KNEE - 1-2 VIEW

[knee ap]
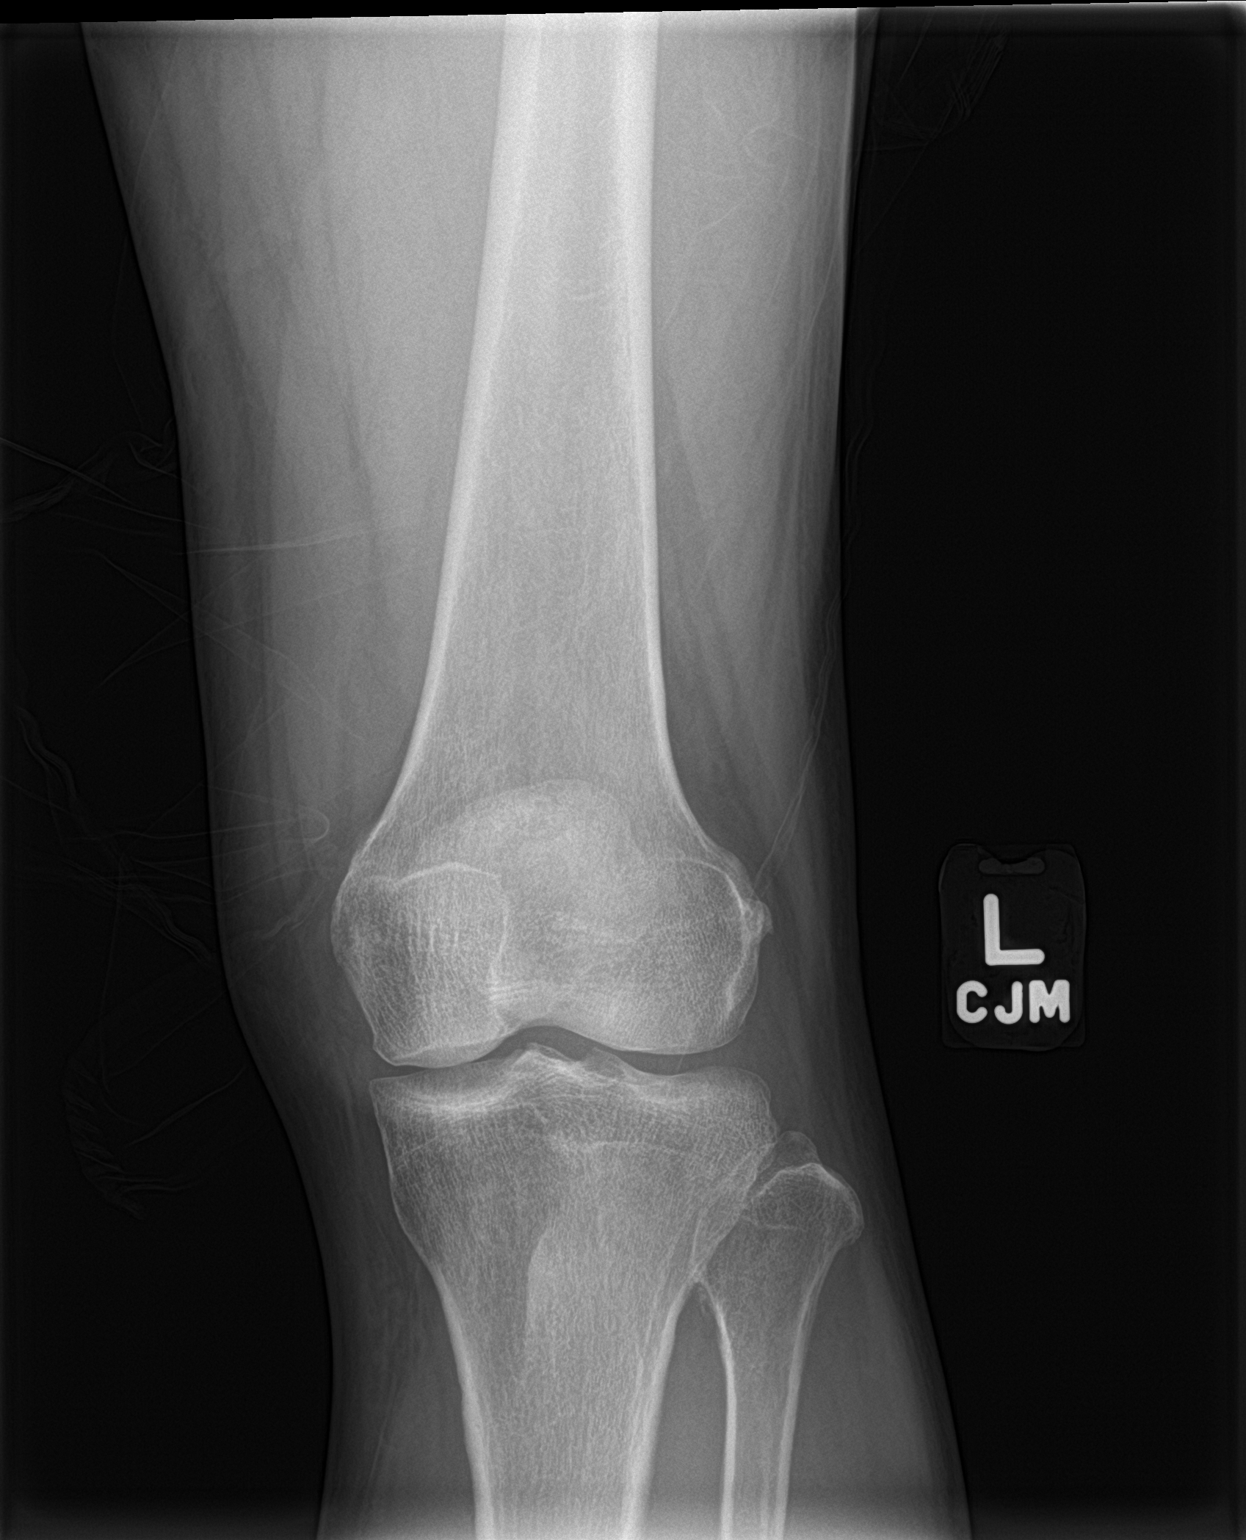

[knee lat]
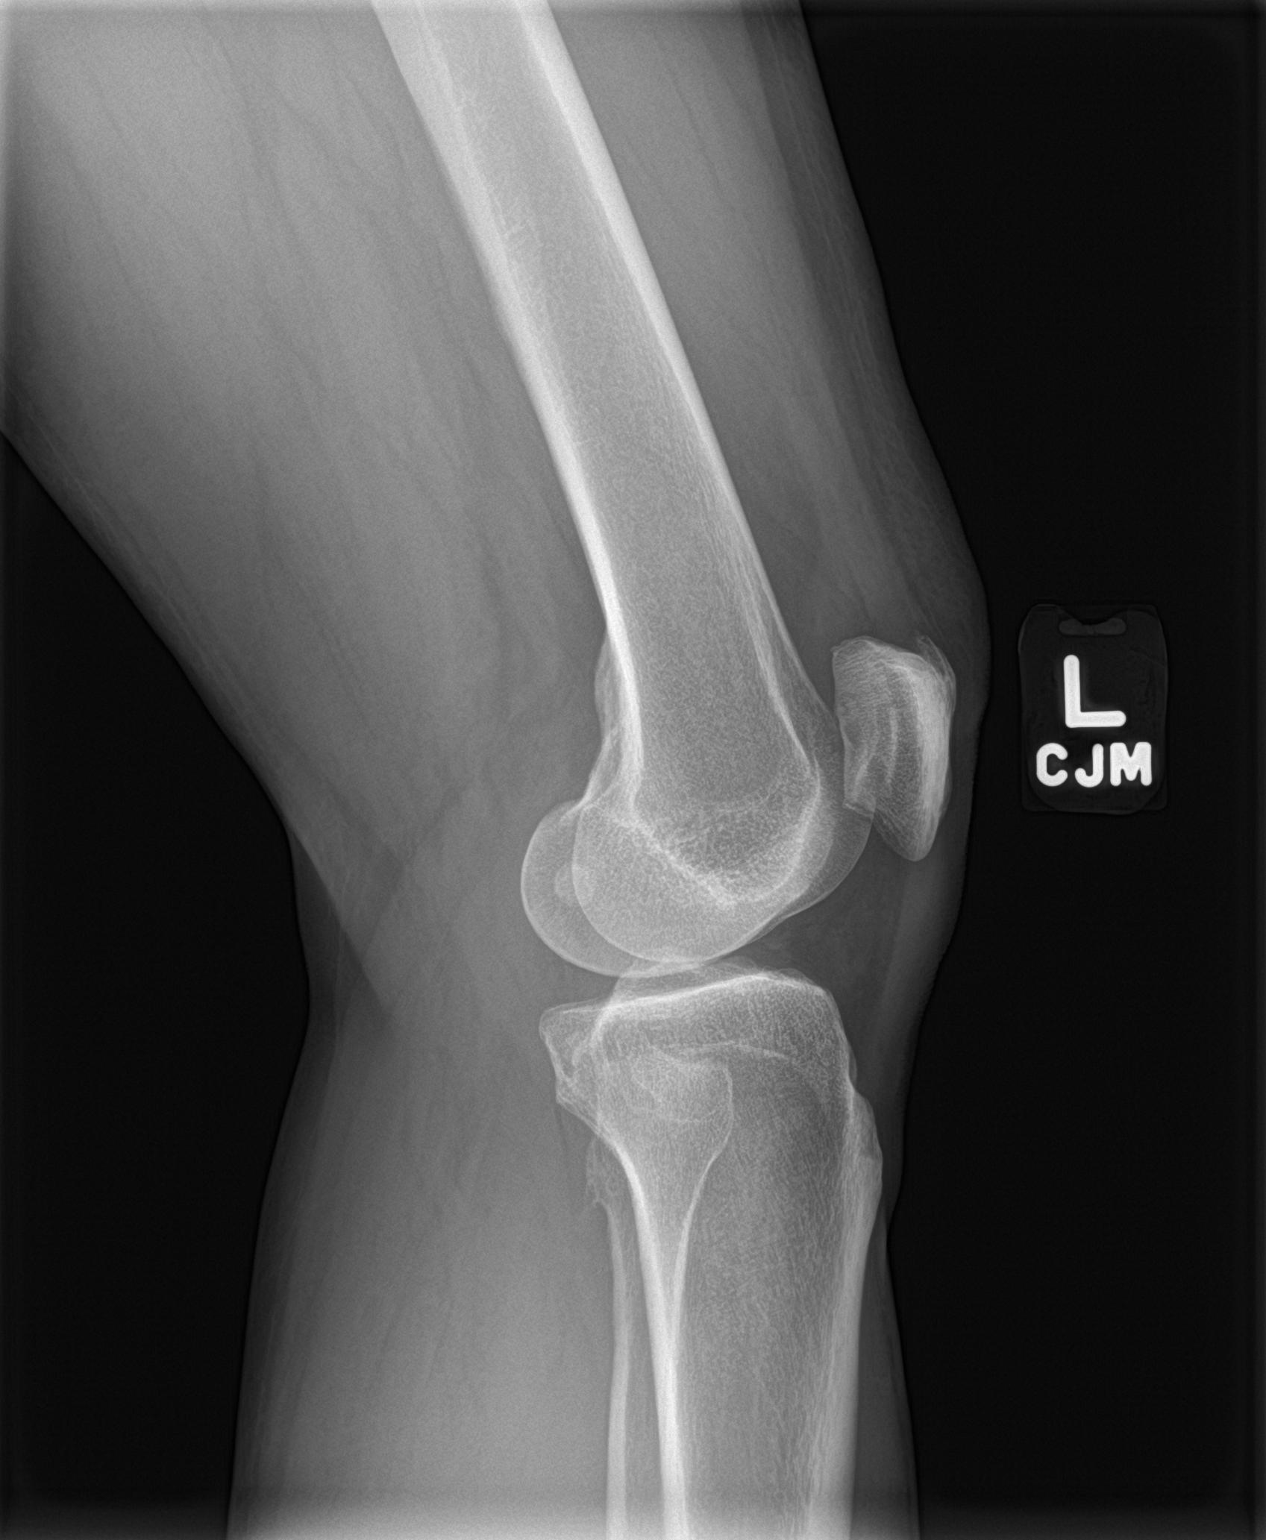

[2 of 2 positions shown; findings below may reference images not displayed]

FINDINGS: No fracture or malalignment. Mild medial and patellofemoral
degenerative change. Small effusion
IMPRESSION: Mild degenerative change with trace effusion

## 2021-08-25 ENCOUNTER — Other Ambulatory Visit: Payer: Self-pay | Admitting: Family Medicine

## 2021-10-22 DIAGNOSIS — C541 Malignant neoplasm of endometrium: Secondary | ICD-10-CM

## 2021-10-22 HISTORY — DX: Malignant neoplasm of endometrium: C54.1

## 2022-01-29 ENCOUNTER — Other Ambulatory Visit: Payer: Self-pay | Admitting: Family Medicine

## 2022-01-29 NOTE — Telephone Encounter (Signed)
Last office visit 03/22/21 for CPE.  Last refilled 03/22/21 for #30 with 3 refills.  CPE scheduled 04/05/22.  ?

## 2022-02-16 DIAGNOSIS — N950 Postmenopausal bleeding: Secondary | ICD-10-CM | POA: Insufficient documentation

## 2022-02-16 DIAGNOSIS — R102 Pelvic and perineal pain: Secondary | ICD-10-CM | POA: Insufficient documentation

## 2022-03-09 DIAGNOSIS — C541 Malignant neoplasm of endometrium: Secondary | ICD-10-CM | POA: Insufficient documentation

## 2022-03-27 ENCOUNTER — Other Ambulatory Visit: Payer: Disability Insurance

## 2022-03-27 ENCOUNTER — Other Ambulatory Visit: Payer: Self-pay | Admitting: Family Medicine

## 2022-03-27 DIAGNOSIS — Z131 Encounter for screening for diabetes mellitus: Secondary | ICD-10-CM

## 2022-03-27 DIAGNOSIS — Z79899 Other long term (current) drug therapy: Secondary | ICD-10-CM

## 2022-03-27 DIAGNOSIS — Z13220 Encounter for screening for lipoid disorders: Secondary | ICD-10-CM

## 2022-04-05 ENCOUNTER — Encounter: Payer: Disability Insurance | Admitting: Family Medicine

## 2022-04-16 ENCOUNTER — Telehealth: Payer: Self-pay

## 2022-05-22 ENCOUNTER — Inpatient Hospital Stay: Payer: Self-pay

## 2022-05-22 ENCOUNTER — Inpatient Hospital Stay: Payer: Disability Insurance | Attending: Oncology | Admitting: Oncology

## 2022-05-22 ENCOUNTER — Encounter: Payer: Self-pay | Admitting: Oncology

## 2022-05-22 VITALS — BP 156/80 | HR 79 | Temp 97.5°F | Resp 18 | Ht 62.5 in | Wt 144.0 lb

## 2022-05-22 DIAGNOSIS — C189 Malignant neoplasm of colon, unspecified: Secondary | ICD-10-CM | POA: Insufficient documentation

## 2022-05-22 DIAGNOSIS — Z79899 Other long term (current) drug therapy: Secondary | ICD-10-CM | POA: Insufficient documentation

## 2022-05-22 DIAGNOSIS — Z7189 Other specified counseling: Secondary | ICD-10-CM | POA: Insufficient documentation

## 2022-05-22 DIAGNOSIS — Z87891 Personal history of nicotine dependence: Secondary | ICD-10-CM | POA: Insufficient documentation

## 2022-05-22 DIAGNOSIS — C541 Malignant neoplasm of endometrium: Secondary | ICD-10-CM | POA: Insufficient documentation

## 2022-05-22 DIAGNOSIS — Z5111 Encounter for antineoplastic chemotherapy: Secondary | ICD-10-CM | POA: Insufficient documentation

## 2022-05-22 HISTORY — DX: Malignant neoplasm of colon, unspecified: C18.9

## 2022-05-22 NOTE — Progress Notes (Signed)
  Pt states she has not taken any BP meds since her surgery in June. Cancerous polyps: biopsed: colonscopy after: 2019, 2020.

## 2022-05-22 NOTE — Progress Notes (Signed)
Called and requested that Dr. Claiborne Billings call Dr. Janese Banks today regarding mutual patient.

## 2022-05-22 NOTE — Progress Notes (Addendum)
Hematology/Oncology Consult note [facility] [phone] [phone]  Patient Care Team: Owens Loffler, MD as PCP - General (Family Medicine) Rico Junker, RN as Registered Nurse Theodore Demark, RN (Inactive) as Registered Nurse Clent Jacks, RN as Oncology Nurse Navigator   Name of the patient: Jaime Johnson  [id number]  07/31/1961    Reason for referral-new diagnosis of endometrial cancer   Referring physician-Dr. Copland  Date of visit: 05/22/22   History of presenting illness-patient is a 61 year old female who presented with postmenopausal bleeding to GYN.Biopsy showed endometrial cancer and patient was referred to Dr. Claiborne Billings from GYN oncology at [facility].  She underwent robotic surgery on 03/30/2022.  Final pathology showed:  Final Pathologic Diagnosis  A. SENTINEL LYMPH NODE, RIGHT EXTERNAL ILIAC ARTERY, RESECTION: Two lymph nodes, negative for metastatic carcinoma (0/2).    B. SENTINEL LYMPH NODE, LEFT EXTERNAL ILIAC ARTERY, RESECTION: One lymph node, negative for metastatic carcinoma (0/1).    C. UTERUS, FALLOPIAN TUBE AND OVARIES, HYSTERECTOMY WITH BILATERAL SALPINGO-OOPHORECTOMY: Endometrioid adenocarcinoma, FIGO grade 2. Tumor measures 5 cm in greatest dimension.  Tumor invades 14 of 17 mm thick myometrium (greater than 50%).  Margins of resection are uninvolved by tumor.  Tumor involves lower uterine segment.  Cervix is uninvolved by tumor.  Benign right fallopian tube and ovary.  Left ovary with tumor located within lymphovascular spaces, no tissue invasion identified.  Left fallopian tube with tumor within lymphovascular spaces, no tissue invasion identified. Free floating tumor located inside left fallopian tube.  pT1b pN0   Patient states that her intraoperative course was complicated by a lost needle in the pelvis during surgery which required prolonged surgery for around 7 hours.  She then required  an Fpz artemether to retrieve the needle.  Her postoperative course was then complicated by fever requiring hospitalization for another week.  She was recommended adjuvant chemotherapy with Norma Fredrickson Taxol for 3 cycles by Dr. Claiborne Billings followed by vaginal brachytherapy.  Patient states that she does not wish to travel all the way to [facility] given her ongoing fatigue as well as discomfort from episiotomy and would prefer to get treatment locally.  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  She is not a diabetic.  She has baseline rheumatoid arthritis  ECOG PS- 1  Pain scale- 0   Review of systems- Review of Systems  Constitutional:  Positive for malaise/fatigue. Negative for chills, fever and weight loss.  HENT:  Negative for congestion, ear discharge and nosebleeds.   Eyes:  Negative for blurred vision.  Respiratory:  Negative for cough, hemoptysis, sputum production, shortness of breath and wheezing.   Cardiovascular:  Negative for chest pain, palpitations, orthopnea and claudication.  Gastrointestinal:  Negative for abdominal pain, blood in stool, constipation, diarrhea, heartburn, melena, nausea and vomiting.  Genitourinary:  Negative for dysuria, flank pain, frequency, hematuria and urgency.  Musculoskeletal:  Negative for back pain, joint pain and myalgias.  Skin:  Negative for rash.  Neurological:  Positive for sensory change (Peripheral neuropathy). Negative for dizziness, tingling, focal weakness, seizures, weakness and headaches.  Endo/Heme/Allergies:  Does not bruise/bleed easily.  Psychiatric/Behavioral:  Negative for depression and suicidal ideas. The patient does not have insomnia.     Allergies  Allergen Reactions   Azithromycin Other (See Comments)    Gets a bad yeast infection from this.    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   Personal history of colonic polyps    Hyperlipidemia LDL goal <100 02/16/2020   Cervical radicular pain (RIGHT) 09/29/2019   Bilateral carpal tunnel syndrome 09/29/2019   Hypertension 12/19/2016   Allergic rhinitis due to pollen      Past Medical History:  Diagnosis Date   Allergic rhinitis due to pollen    Colon cancer ([place])    Hyperlipidemia LDL goal <100 02/16/2020   Hypertension 12/19/2016     Past Surgical History:  Procedure Laterality Date   CESAREAN SECTION     COLONOSCOPY WITH PROPOFOL N/A 11/12/2016   Procedure: COLONOSCOPY WITH PROPOFOL;  Surgeon: Lucilla Lame, MD;  Location: [place];  Service: Endoscopy;  Laterality: N/A;   COLONOSCOPY WITH PROPOFOL N/A 03/29/2017   Procedure: COLONOSCOPY WITH PROPOFOL;  Surgeon: Lucilla Lame, MD;  Location: [place];  Service: Endoscopy;  Laterality: N/A;   COLONOSCOPY WITH PROPOFOL N/A 03/14/2020   Procedure: COLONOSCOPY WITH PROPOFOL;  Surgeon: Lucilla Lame, MD;  Location: [place];  Service: Endoscopy;  Laterality: N/A;  priority 4   POLYPECTOMY  11/12/2016   Procedure: POLYPECTOMY;  Surgeon: Lucilla Lame, MD;  Location: [place];  Service: Endoscopy;;   POLYPECTOMY  03/29/2017   Procedure: POLYPECTOMY;  Surgeon: Lucilla Lame, MD;  Location: [place];  Service: Endoscopy;;    Social History   Socioeconomic History   Marital status: Married    Spouse name: Not on file   Number of children: Not on file   Years of education: Not on file   Highest education level: Not on file  Occupational History   Not on file  Tobacco Use   Smoking status: Former    Types: Cigarettes    Quit date: 10/22/1978    Years since quitting: 43.6   Smokeless tobacco: Never  Vaping Use   Vaping Use: Never used  Substance and Sexual Activity   Alcohol use: No   Drug use: No    Sexual activity: Yes    Partners: Male  Other Topics Concern   Not on file  Social History Narrative   Not on file   Social Determinants of Health   Financial Resource Strain: Not on file  Food Insecurity: Not on file  Transportation Needs: Not on file  Physical Activity: Not on file  Stress: Not on file  Social Connections: Not on file  Intimate Partner Violence: Not on file     Family History  Problem Relation Age of Onset   Hepatitis C Mother 33       d/c at 78   Hypertension Father    Hepatitis C Brother    Breast cancer Neg Hx      Current Outpatient Medications:    ferrous sulfate 325 (65 FE) MG tablet, Take by mouth., Disp: , Rfl:    Multiple Vitamin (QUINTABS) TABS, Take 1 tablet by mouth daily., Disp: , Rfl:    Multiple Vitamins-Minerals (MULTIVITAMIN GUMMIES ADULTS PO), Take by mouth daily., Disp: , Rfl:    oxyCODONE-acetaminophen (PERCOCET/ROXICET) 5-325 MG tablet, Take 1 tablet by mouth every 6 (six) hours as needed., Disp: , Rfl:    traMADol (ULTRAM) 50 MG tablet, TAKE 1 TABLET(50 MG) BY MOUTH DAILY, Disp: 30 tablet, Rfl: 3   diazepam (VALIUM) 5 MG tablet, TAKE 1 TABLET(5 MG) BY MOUTH EVERY  6 HOURS AS NEEDED FOR ANXIETY (Patient not taking: Reported on 05/22/2022), Disp: 30 tablet, Rfl: 0   lisinopril-hydrochlorothiazide (ZESTORETIC) 10-12.5 MG tablet, Take 1 tablet by mouth daily. (Patient not taking: Reported on 05/22/2022), Disp: 90 tablet, Rfl: 3   LUMIFY 0.025 % SOLN, Apply 1 drop to eye as needed.  (Patient not taking: Reported on 05/22/2022), Disp: , Rfl:    oxyCODONE (OXY IR/ROXICODONE) 5 MG immediate release tablet, Take 5 mg by mouth every 4 (four) hours as needed. (Patient not taking: Reported on 05/22/2022), Disp: , Rfl:    polyethylene glycol powder (GLYCOLAX/MIRALAX) 17 GM/SCOOP powder, Take by mouth. (Patient not taking: Reported on 05/22/2022), Disp: , Rfl:    PSYLLIUM PO, Take by mouth. (Patient not taking: Reported on 05/22/2022), Disp: , Rfl:     rosuvastatin (CRESTOR) 10 MG tablet, Take 1 tablet (10 mg total) by mouth daily. (Patient not taking: Reported on 05/22/2022), Disp: 90 tablet, Rfl: 3   senna-docusate (SENOKOT-S) 8.6-50 MG tablet, Take by mouth. (Patient not taking: Reported on 05/22/2022), Disp: , Rfl:    SYSTANE COMPLETE 0.6 % SOLN, , Disp: , Rfl:    Physical exam:  Vitals:   05/22/22 1057  BP: (!) 156/80  Pulse: 79  Resp: 18  Temp: (!) 97.5 F (36.4 C)  SpO2: 100%  Weight: 144 lb (65.3 kg)  Height: 5' 2.5" (1.588 m)   Physical Exam Constitutional:      General: She is not in acute distress. Cardiovascular:     Rate and Rhythm: Normal rate and regular rhythm.     Heart sounds: Normal heart sounds.  Pulmonary:     Effort: Pulmonary effort is normal.     Breath sounds: Normal breath sounds.  Abdominal:     General: Bowel sounds are normal.     Palpations: Abdomen is soft.     Comments: Scar of recent laparoscopic surgery healing well  Skin:    General: Skin is warm and dry.  Neurological:     Mental Status: She is alert and oriented to person, place, and time.           Latest Ref Rng & Units 03/14/2021    9:49 AM  CMP  Glucose 65 - 99 mg/dL 107   BUN 7 - 25 mg/dL 16   Creatinine 0.50 - 0.99 mg/dL 0.86   Sodium 135 - 146 mmol/L 142   Potassium 3.5 - 5.3 mmol/L 4.2   Chloride 98 - 110 mmol/L 104   CO2 20 - 32 mmol/L 29   Calcium 8.6 - 10.4 mg/dL 9.6   Total Protein 6.1 - 8.1 g/dL 7.8   Total Bilirubin 0.2 - 1.2 mg/dL 0.6   AST 10 - 35 U/L 24   ALT 6 - 29 U/L 31       Latest Ref Rng & Units 03/14/2021    9:49 AM  CBC  WBC 3.8 - 10.8 Thousand/uL 4.8   Hemoglobin 11.7 - 15.5 g/dL 14.5   Hematocrit 35.0 - 45.0 % 43.8   Platelets 140 - 400 Thousand/uL 290     Assessment and plan- Patient is a 61 y.o. 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 Claiborne Billings GYN oncology from [facility] to  get clarification if patient is stage Ib or stage III.  Based on pathology report there was lymphovascular involvement of the left ovary and fallopian tube but there was  no frank tissue invasion identified.  This would therefore constitute T1b not T3 disease.  I am therefore not certain that I can classify her as having stage III cancer.  I would like our pathologist here at [facility] to review the slides from [facility] to clarify if this is T3 versus T1b disease.  As per my conversation with Dr. Claiborne Billings I am being told that her case was discussed tumor board and it was thought that the fallopian tube and ovary was involved but it was not reflected as such in her pathology report.  Management for endometrial cancer will change based on T3 versus T1b disease.  If she truly has T3 disease she would benefit from adjuvant chemotherapy despite a grade 2 endometrioid histology.  She is already 6 to 7 weeks out of her surgery and should ideally be starting chemotherapy for maximal benefit if she has stage III disease.  However if she has stage Ib disease adjuvant chemotherapy is not recommended in this setting. Either vaginal brachytherapy or EBRT is recommended for stage I disease alone. Typically EBRT is recommended in those with >23 years of age and with LV invasion.   At this time I am awaiting further clarification from the pathology team at [facility] as well as second opinion pathology slide consult here at [facility] before considering chemotherapy and follow-up will be decided based on that.  I had communicated all this to the patient in great detail after she left the clinic as well   Cancer Staging  Endometrial carcinoma [facility]) Staging form: Corpus Uteri - Carcinoma and Carcinosarcoma, AJCC 8th Edition - Pathologic stage from 05/22/2022: FIGO Stage IB (pT1b, pN0, cM0) - Signed by Sindy Guadeloupe, MD on 05/22/2022 Histologic grade (G): G2 Histologic grading system: 3 grade system Residual  tumor (R): R0 - None     Thank you for this kind referral and the opportunity to participate in the care of this patient   Visit Diagnosis 1. Endometrial carcinoma ([place])   2. Goals of care, counseling/discussion     Dr. Randa Evens, MD, MPH [facility] at [facility] [phone] 05/22/2022 5:05 PM

## 2022-05-23 ENCOUNTER — Telehealth: Payer: Self-pay

## 2022-05-23 ENCOUNTER — Telehealth: Payer: Self-pay | Admitting: *Deleted

## 2022-05-23 NOTE — Telephone Encounter (Signed)
I called her

## 2022-05-23 NOTE — Telephone Encounter (Signed)
Patient called asking for Dr Janese Banks to call her and let her know if and what was discussed with Dr Claiborne Billings and the outcome of said discussion.

## 2022-05-23 NOTE — Telephone Encounter (Signed)
Slides for case [id number], collected 03/30/2022 request from [facility] pathology.

## 2022-05-24 ENCOUNTER — Other Ambulatory Visit: Payer: Self-pay

## 2022-05-24 ENCOUNTER — Other Ambulatory Visit: Payer: Self-pay | Admitting: *Deleted

## 2022-05-24 DIAGNOSIS — C541 Malignant neoplasm of endometrium: Secondary | ICD-10-CM

## 2022-05-25 ENCOUNTER — Encounter: Payer: Self-pay | Admitting: Obstetrics & Gynecology

## 2022-05-29 ENCOUNTER — Telehealth: Payer: Self-pay

## 2022-05-29 NOTE — Telephone Encounter (Signed)
On 05/22/2022, Dr. Claiborne Billings was asking [facility] pathology to review case and issue an addendum to pathology report. Unable to see outcome in care everywhere. Called Dr. Evette Georges office and left message for update. Case is also being reviewed at [facility] pathology.

## 2022-05-30 ENCOUNTER — Encounter: Payer: Self-pay | Admitting: Obstetrics & Gynecology

## 2022-05-30 ENCOUNTER — Telehealth: Payer: Self-pay

## 2022-05-30 NOTE — Telephone Encounter (Signed)
Case discussed with Dr. Fransisca Connors, Dr. Garner Nash and Alease Medina NP. Added to NP schedule for chemo treatment planning.

## 2022-05-31 ENCOUNTER — Other Ambulatory Visit: Payer: Self-pay | Admitting: Radiology

## 2022-05-31 ENCOUNTER — Encounter: Payer: Self-pay | Admitting: *Deleted

## 2022-05-31 ENCOUNTER — Ambulatory Visit
Admission: RE | Admit: 2022-05-31 | Discharge: 2022-05-31 | Disposition: A | Discharge status: Home / Self Care | Payer: Self-pay | Source: Ambulatory Visit | Attending: Radiation Oncology | Admitting: Radiation Oncology

## 2022-05-31 ENCOUNTER — Inpatient Hospital Stay (HOSPITAL_BASED_OUTPATIENT_CLINIC_OR_DEPARTMENT_OTHER): Payer: Self-pay | Admitting: Nurse Practitioner

## 2022-05-31 ENCOUNTER — Other Ambulatory Visit: Payer: Self-pay

## 2022-05-31 ENCOUNTER — Encounter: Payer: Self-pay | Admitting: Radiation Oncology

## 2022-05-31 VITALS — BP 181/74 | HR 62 | Temp 98.0°F | Resp 16

## 2022-05-31 VITALS — BP 186/77 | HR 67 | Temp 98.0°F | Ht 62.5 in | Wt 143.2 lb

## 2022-05-31 DIAGNOSIS — Z79899 Other long term (current) drug therapy: Secondary | ICD-10-CM | POA: Insufficient documentation

## 2022-05-31 DIAGNOSIS — Z85038 Personal history of other malignant neoplasm of large intestine: Secondary | ICD-10-CM | POA: Insufficient documentation

## 2022-05-31 DIAGNOSIS — C541 Malignant neoplasm of endometrium: Secondary | ICD-10-CM

## 2022-05-31 DIAGNOSIS — I10 Essential (primary) hypertension: Secondary | ICD-10-CM | POA: Insufficient documentation

## 2022-05-31 DIAGNOSIS — Z87891 Personal history of nicotine dependence: Secondary | ICD-10-CM | POA: Insufficient documentation

## 2022-05-31 DIAGNOSIS — E785 Hyperlipidemia, unspecified: Secondary | ICD-10-CM | POA: Insufficient documentation

## 2022-05-31 NOTE — Progress Notes (Signed)
Here today to discuss treatment planning for recently diagnosed endometrial cancer.

## 2022-05-31 NOTE — Progress Notes (Signed)
Patient called back, to come at 1000 for 1100 procedure.

## 2022-05-31 NOTE — Progress Notes (Signed)
Patient on schedule for port placement, called and spoke with patient on phone with pre procedure instructions given. Made aware to be here at 0900, NPO after MN tonight as well as driver post procedure/recovery/discharge. Stated understanding.

## 2022-05-31 NOTE — Consult Note (Signed)
NEW PATIENT EVALUATION  Name: Jaime Johnson  MRN: [id number]  Date:   05/31/2022     DOB: 06/20/1961   This 61 y.o. female patient presents to the clinic for initial evaluation of stage III endometrial cancer status post robotic surgery at [place]: Owens Loffler, MD  CHIEF COMPLAINT:  Chief Complaint  Patient presents with   endometrial cancer    DIAGNOSIS: The encounter diagnosis was Endometrial carcinoma ([place]).   PREVIOUS INVESTIGATIONS:  Pathology reports reviewed CT scans and PET CT scan reviewed Clinical notes reviewed   HPI: Patient is a 61 year old female who originally presented with abdominal pelvic pressure fecal urgency and pain.  Workup revealed endometrial carcinoma.  She eventually underwent a TAH and BSO with sentinel lymph node biopsy done robotically.  She had a FIGO grade 2 endometrial adenocarcinoma tumor measuring 5 cm in greatest dimension.  There was 14 of 17 mm of myometrial invasion greater than 50%.  There was a free-floating tumor inside the fallopian tube.  Sentinel lymph nodes of right external iliac and left external iliac nodes were negative.  There was lymph-vascular invasion of the left loping tube as well as left ovary with tumor located within the lymph-vascular space.  Her postoperative course was complicated by loss needle requiring additional surgery.  Her postoperative course was also complicated by fever requiring hospitalization.  [facility] 3 cycles of CarboTaxol followed by vaginal brachytherapy.  She is seen here for consultation.  I have reviewed her case with Dr. Fransisca Connors who believes this is a stage III rather than a stage I disease based on the fallopian tube and ovarian involvement.  She continues to have some abdominal pain and discomfort is slowly recovering from her surgery.  She specifically denies any lower urinary tract symptoms diarrhea.  PLANNED TREATMENT REGIMEN: Chemotherapy followed  by vaginal brachytherapy  PAST MEDICAL HISTORY:  has a past medical history of Allergic rhinitis due to pollen, Colon cancer ([place]), Hyperlipidemia LDL goal <100 (02/16/2020), and Hypertension (12/19/2016).    PAST SURGICAL HISTORY:  Past Surgical History:  Procedure Laterality Date   CESAREAN SECTION     COLONOSCOPY WITH PROPOFOL N/A 11/12/2016   Procedure: COLONOSCOPY WITH PROPOFOL;  Surgeon: Lucilla Lame, MD;  Location: [place];  Service: Endoscopy;  Laterality: N/A;   COLONOSCOPY WITH PROPOFOL N/A 03/29/2017   Procedure: COLONOSCOPY WITH PROPOFOL;  Surgeon: Lucilla Lame, MD;  Location: [place];  Service: Endoscopy;  Laterality: N/A;   COLONOSCOPY WITH PROPOFOL N/A 03/14/2020   Procedure: COLONOSCOPY WITH PROPOFOL;  Surgeon: Lucilla Lame, MD;  Location: [place];  Service: Endoscopy;  Laterality: N/A;  priority 4   POLYPECTOMY  11/12/2016   Procedure: POLYPECTOMY;  Surgeon: Lucilla Lame, MD;  Location: [place];  Service: Endoscopy;;   POLYPECTOMY  03/29/2017   Procedure: POLYPECTOMY;  Surgeon: Lucilla Lame, MD;  Location: [place];  Service: Endoscopy;;    FAMILY HISTORY: family history includes Hepatitis C in her brother; Hepatitis C (age of onset: 31) in her mother; Hypertension in her father.  SOCIAL HISTORY:  reports that she quit smoking about 43 years ago. Her smoking use included cigarettes. She has never used smokeless tobacco. She reports that she does not drink alcohol and does not use drugs.  ALLERGIES: Azithromycin  MEDICATIONS:  Current Outpatient Medications  Medication Sig Dispense Refill   ferrous sulfate 325 (65 FE) MG tablet Take by mouth.     lisinopril-hydrochlorothiazide (ZESTORETIC) 10-12.5 MG  tablet Take 1 tablet by mouth daily. 90 tablet 3   Multiple Vitamin (QUINTABS) TABS Take 1 tablet by mouth daily.     Multiple Vitamins-Minerals (MULTIVITAMIN GUMMIES ADULTS PO) Take by mouth daily.     rosuvastatin  (CRESTOR) 10 MG tablet Take 1 tablet (10 mg total) by mouth daily. 90 tablet 3   SYSTANE COMPLETE 0.6 % SOLN      traMADol (ULTRAM) 50 MG tablet TAKE 1 TABLET(50 MG) BY MOUTH DAILY 30 tablet 3   No current facility-administered medications for this encounter.    ECOG PERFORMANCE STATUS:  1 - Symptomatic but completely ambulatory  REVIEW OF SYSTEMS: Patient denies any weight loss, fatigue, weakness, fever, chills or night sweats. Patient denies any loss of vision, blurred vision. Patient denies any ringing  of the ears or hearing loss. No irregular heartbeat. Patient denies heart murmur or history of fainting. Patient denies any chest pain or pain radiating to her upper extremities. Patient denies any shortness of breath, difficulty breathing at night, cough or hemoptysis. Patient denies any swelling in the lower legs. Patient denies any nausea vomiting, vomiting of blood, or coffee ground material in the vomitus. Patient denies any stomach pain. Patient states has had normal bowel movements no significant constipation or diarrhea. Patient denies any dysuria, hematuria or significant nocturia. Patient denies any problems walking, swelling in the joints or loss of balance. Patient denies any skin changes, loss of hair or loss of weight. Patient denies any excessive worrying or anxiety or significant depression. Patient denies any problems with insomnia. Patient denies excessive thirst, polyuria, polydipsia. Patient denies any swollen glands, patient denies easy bruising or easy bleeding. Patient denies any recent infections, allergies or URI. Patient "s visual fields have not changed significantly in recent time.  Her PET CT scan did show evidence of endometrial carcinoma with hypermetabolic lymph nodes in the left internal obturator and internal iliac and external iliac nodes concerning for malignancy.  She also had some slight uptake in the adrenal gland unclear whether this represented metastatic  disease.   PHYSICAL EXAM: BP (!) 186/77 (BP Location: Right Arm, Patient Position: Sitting, Cuff Size: Normal)   Pulse 67   Temp 98 F (36.7 C) (Tympanic)   Ht 5' 2.5" (1.588 m) Comment: stated HT  Wt 143 lb 3.2 oz (65 kg)   LMP 02/29/2020 (Approximate) Comment: Has had random spotting; hadn't had a period 1.5 yrs prior  BMI 25.77 kg/m  Well-developed well-nourished patient in NAD. HEENT reveals PERLA, EOMI, discs not visualized.  Oral cavity is clear. No oral mucosal lesions are identified. Neck is clear without evidence of cervical or supraclavicular adenopathy. Lungs are clear to A&P. Cardiac examination is essentially unremarkable with regular rate and rhythm without murmur rub or thrill. Abdomen is benign with no organomegaly or masses noted. Motor sensory and DTR levels are equal and symmetric in the upper and lower extremities. Cranial nerves II through XII are grossly intact. Proprioception is intact. No peripheral adenopathy or edema is identified. No motor or sensory levels are noted. Crude visual fields are within normal range.  LABORATORY DATA: Pathology reports reviewed    RADIOLOGY RESULTS: PET CT scan reviewed   IMPRESSION: Stage III endometrial adenocarcinoma and 61 year old female  PLAN: This time we discussed the case with Dr. Fransisca Connors who agrees with this being a stage III disease.  He would recommend systemic chemotherapy followed by vaginal brachytherapy.  Patient is to see medical oncology today.  I have briefly gone over risks  and benefits of vaginal brachytherapy.  Will reevaluate her after completion of chemo.  I believe a follow-up PET scan in the future may be beneficial to again follow-up on some of the PET findings initially with possible pelvic lymph node involvement.  Patient comprehends my recommendations well.  I would like to take this opportunity to thank you for allowing me to participate in the care of your patient.Noreene Filbert, MD

## 2022-05-31 NOTE — Progress Notes (Signed)
Hematology/Oncology Consult Note [facility] Telephone:([phone] [phone]  Patient Care Team: Owens Loffler, MD as PCP - General (Family Medicine) Rico Junker, RN as Registered Nurse Theodore Demark, RN (Inactive) as Registered Nurse Clent Jacks, RN as Oncology Nurse Navigator   Name of the patient: Jaime Johnson  [id number]  [birth year]   Reason for referral- Endometrial cancer   Referring physician-Dr. Copland  Date of visit: 05/31/22   History of presenting illness- Patient is 61 year old female diagnosed with endometrial cancer, s/p RA-TLH-BSO with SLND and perineal repair at [facility] on 03/31/22 who returns to clinic for discussion of management.   Patient is a 61 year old female who presented with postmenopausal bleeding to GYN.Biopsy showed endometrial cancer and patient was referred to Dr. Claiborne Billings from GYN oncology at [facility].  She underwent robotic surgery on 03/30/2022.  Final pathology showed:  Final Pathologic Diagnosis  A. SENTINEL LYMPH NODE, RIGHT EXTERNAL ILIAC ARTERY, RESECTION: Two lymph nodes, negative for metastatic carcinoma (0/2).    B. SENTINEL LYMPH NODE, LEFT EXTERNAL ILIAC ARTERY, RESECTION: One lymph node, negative for metastatic carcinoma (0/1).    C. UTERUS, FALLOPIAN TUBE AND OVARIES, HYSTERECTOMY WITH BILATERAL SALPINGO-OOPHORECTOMY: Endometrioid adenocarcinoma, FIGO grade 2. Tumor measures 5 cm in greatest dimension.  Tumor invades 14 of 17 mm thick myometrium (greater than 50%).  Margins of resection are uninvolved by tumor.  Tumor involves lower uterine segment.  Cervix is uninvolved by tumor.  Benign right fallopian tube and ovary.  Left ovary with tumor located within lymphovascular spaces, no tissue invasion identified.  Left fallopian tube with tumor within lymphovascular spaces, no tissue invasion identified. Free floating tumor located inside left fallopian tube.  pT1b  pN0  Patient states that her intraoperative course was complicated by a lost needle in the pelvis during surgery which required prolonged surgery for around 7 hours.  She then required an Fpz artemether to retrieve the needle.  Her postoperative course was then complicated by fever requiring hospitalization for another week.  She was recommended adjuvant chemotherapy with Norma Fredrickson Taxol for 3 cycles by Dr. Claiborne Billings followed by vaginal brachytherapy.  Patient states that she does not wish to travel all the way to [facility] given her ongoing fatigue as well as discomfort from episiotomy and would prefer to get treatment locally.  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  She is not a diabetic.  She has baseline rheumatoid arthritis  Interval History: Patient returns to clinic for further evaluation, discussion of pathology and treatment planning. She feels well and is asymptomatic. She is anxious about starting treatment. She has a good appetite. Post surgical pain is improving and she is starting to feel mor elike herself. She is accompanied by her husband.   ECOG PS- 1  Pain scale- 0   Review of systems- Review of Systems  Constitutional:  Positive for malaise/fatigue. Negative for chills, fever and weight loss.  HENT:  Negative for congestion, ear discharge and nosebleeds.   Eyes:  Negative for blurred vision.  Respiratory:  Negative for cough, hemoptysis, sputum production, shortness of breath and wheezing.   Cardiovascular:  Negative for chest pain, palpitations, orthopnea and claudication.  Gastrointestinal:  Negative for abdominal pain, blood in stool, constipation, diarrhea, heartburn, melena, nausea and vomiting.  Genitourinary:  Negative for dysuria, flank pain, frequency, hematuria and urgency.  Musculoskeletal:  Negative for back pain, joint pain and myalgias.  Skin:  Negative for rash.  Neurological:  Positive  for sensory change (Peripheral neuropathy). Negative for  dizziness, tingling, focal weakness, seizures, weakness and headaches.  Endo/Heme/Allergies:  Does not bruise/bleed easily.  Psychiatric/Behavioral:  Negative for depression and suicidal ideas. The patient does not have insomnia.    Allergies  Allergen Reactions   Azithromycin Other (See Comments)    Gets a bad yeast infection from this.   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   Personal history of colonic polyps    Hyperlipidemia LDL goal <100 02/16/2020   Cervical radicular pain (RIGHT) 09/29/2019   Bilateral carpal tunnel syndrome 09/29/2019   Hypertension 12/19/2016   Allergic rhinitis due to pollen    Past Medical History:  Diagnosis Date   Allergic rhinitis due to pollen    Colon cancer ([place])    Hyperlipidemia LDL goal <100 02/16/2020   Hypertension 12/19/2016   Past Surgical History:  Procedure Laterality Date   CESAREAN SECTION     COLONOSCOPY WITH PROPOFOL N/A 11/12/2016   Procedure: COLONOSCOPY WITH PROPOFOL;  Surgeon: Lucilla Lame, MD;  Location: [place];  Service: Endoscopy;  Laterality: N/A;   COLONOSCOPY WITH PROPOFOL N/A 03/29/2017   Procedure: COLONOSCOPY WITH PROPOFOL;  Surgeon: Lucilla Lame, MD;  Location: [place];  Service: Endoscopy;  Laterality: N/A;   COLONOSCOPY WITH PROPOFOL N/A 03/14/2020   Procedure: COLONOSCOPY WITH PROPOFOL;  Surgeon: Lucilla Lame, MD;  Location: [place];  Service: Endoscopy;  Laterality: N/A;  priority 4   POLYPECTOMY  11/12/2016   Procedure: POLYPECTOMY;  Surgeon: Lucilla Lame, MD;  Location: [place];  Service: Endoscopy;;   POLYPECTOMY  03/29/2017   Procedure: POLYPECTOMY;  Surgeon: Lucilla Lame, MD;  Location: [place];   Service: Endoscopy;;   Social History   Socioeconomic History   Marital status: Married    Spouse name: Not on file   Number of children: Not on file   Years of education: Not on file   Highest education level: Not on file  Occupational History   Not on file  Tobacco Use   Smoking status: Former    Types: Cigarettes    Quit date: 10/22/1978    Years since quitting: 43.6   Smokeless tobacco: Never  Vaping Use   Vaping Use: Never used  Substance and Sexual Activity   Alcohol use: No   Drug use: No   Sexual activity: Yes    Partners: Male  Other Topics Concern   Not on file  Social History Narrative   Not on file   Social Determinants of Health   Financial Resource Strain: Not on file  Food Insecurity: Not on file  Transportation Needs: Not on file  Physical Activity: Not on file  Stress: Not on file  Social Connections: Not on file  Intimate Partner Violence: Not on file    Family History  Problem Relation Age of Onset   Hepatitis C Mother 39       d/c at 79   Hypertension Father    Hepatitis C Brother    Breast cancer Neg Hx    Current Outpatient Medications:    ferrous sulfate 325 (65 FE) MG tablet, Take by mouth., Disp: , Rfl:    lisinopril-hydrochlorothiazide (ZESTORETIC) 10-12.5 MG tablet, Take 1 tablet by mouth daily., Disp:  90 tablet, Rfl: 3   Multiple Vitamin (QUINTABS) TABS, Take 1 tablet by mouth daily., Disp: , Rfl:    Multiple Vitamins-Minerals (MULTIVITAMIN GUMMIES ADULTS PO), Take by mouth daily., Disp: , Rfl:    rosuvastatin (CRESTOR) 10 MG tablet, Take 1 tablet (10 mg total) by mouth daily., Disp: 90 tablet, Rfl: 3   SYSTANE COMPLETE 0.6 % SOLN, , Disp: , Rfl:    traMADol (ULTRAM) 50 MG tablet, TAKE 1 TABLET(50 MG) BY MOUTH DAILY, Disp: 30 tablet, Rfl: 3   Physical exam:  Vitals:   05/31/22 1034  BP: (!) 181/74  Pulse: 62  Resp: 16  Temp: 98 F (36.7 C)   Physical Exam Constitutional:      General: She is not in acute  distress. Cardiovascular:     Rate and Rhythm: Normal rate and regular rhythm.     Heart sounds: Normal heart sounds.  Pulmonary:     Effort: Pulmonary effort is normal.     Breath sounds: Normal breath sounds.  Abdominal:     General: Bowel sounds are normal.     Palpations: Abdomen is soft.     Comments: Scar of recent laparoscopic surgery healing well  Skin:    General: Skin is warm and dry.  Neurological:     Mental Status: She is alert and oriented to person, place, and time.        Latest Ref Rng & Units 03/14/2021    9:49 AM  CMP  Glucose 65 - 99 mg/dL 781   BUN 7 - 25 mg/dL 16   Creatinine 6.41 - 0.99 mg/dL 4.78   Sodium 454 - 312 mmol/L 142   Potassium 3.5 - 5.3 mmol/L 4.2   Chloride 98 - 110 mmol/L 104   CO2 20 - 32 mmol/L 29   Calcium 8.6 - 10.4 mg/dL 9.6   Total Protein 6.1 - 8.1 g/dL 7.8   Total Bilirubin 0.2 - 1.2 mg/dL 0.6   AST 10 - 35 U/L 24   ALT 6 - 29 U/L 31       Latest Ref Rng & Units 03/14/2021    9:49 AM  CBC  WBC 3.8 - 10.8 Thousand/uL 4.8   Hemoglobin 11.7 - 15.5 g/dL 39.3   Hematocrit 11.1 - 45.0 % 43.8   Platelets 140 - 400 Thousand/uL 290     Assessment and plan- Patient is a 61 y.o. 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 This is a high risk stage Ib endometrial carcinoma based on pathology review from [facility]. Case is currently pending second opinion with Dr. Oneita Kras. Preliminary suggestive of high risk features. Also reviewed with Dr. Johnnette Litter, gyn onc who feels that while patient is technically a stage 1b, she has evidence of tumor into the lymphovascular space. Additionally, isolated tumor cells in 1 lymph node. Clinically, high risk. Consensus opinion is for adjuvant chemotherapy and likely regimen is carbo-taxol. Reviewed with Dr Orlie Dakin who will see patient for final chemotherapy planning. In interim, will plan to have port placed and order PET scan. Will  order HER2.   Recommended referral to social work as currently patient does not have health insurance. Reports SSD as income.   Hypertension- asymptomatic but reports hx of uncontrolled bp. Encouraged her to monitor at home and follow up with pcp.   Disposition:  Monday Pet Thursday - [place] Next available- port/lab (cbc, cmp), [place], new start carbo-taxol (every 3 weeks)- [place]  Cancer Staging  Endometrial carcinoma [facility]) Staging form: Corpus Uteri - Carcinoma and Carcinosarcoma, AJCC 8th Edition - Pathologic stage from 05/22/2022: FIGO Stage IB (pT1b, pN0, cM0) - Signed by Sindy Guadeloupe, MD on 05/22/2022 Histologic grade (G): G2 Histologic grading system: 3 grade system Residual tumor (R): R0 - None  Thank you for this kind referral and the opportunity to participate in the care of this patient   Visit Diagnosis 1. Endometrial carcinoma ([place])    Beckey Rutter, DNP, AGNP-C [place] [facility] [phone] (clinic) 05/31/2022

## 2022-06-01 ENCOUNTER — Ambulatory Visit
Admission: RE | Admit: 2022-06-01 | Discharge: 2022-06-01 | Disposition: A | Discharge status: Home / Self Care | Payer: Self-pay | Source: Ambulatory Visit | Attending: Nurse Practitioner | Admitting: Nurse Practitioner

## 2022-06-01 ENCOUNTER — Encounter: Payer: Self-pay | Admitting: Radiology

## 2022-06-01 ENCOUNTER — Ambulatory Visit
Admission: RE | Admit: 2022-06-01 | Discharge: 2022-06-01 | Disposition: A | Discharge status: Home / Self Care | Payer: Disability Insurance | Source: Ambulatory Visit | Attending: Nurse Practitioner | Admitting: Nurse Practitioner

## 2022-06-01 ENCOUNTER — Other Ambulatory Visit: Payer: Self-pay

## 2022-06-01 DIAGNOSIS — I10 Essential (primary) hypertension: Secondary | ICD-10-CM | POA: Insufficient documentation

## 2022-06-01 DIAGNOSIS — E785 Hyperlipidemia, unspecified: Secondary | ICD-10-CM | POA: Insufficient documentation

## 2022-06-01 DIAGNOSIS — Z87891 Personal history of nicotine dependence: Secondary | ICD-10-CM | POA: Insufficient documentation

## 2022-06-01 DIAGNOSIS — C541 Malignant neoplasm of endometrium: Secondary | ICD-10-CM | POA: Insufficient documentation

## 2022-06-01 DIAGNOSIS — Z85038 Personal history of other malignant neoplasm of large intestine: Secondary | ICD-10-CM | POA: Insufficient documentation

## 2022-06-01 HISTORY — PX: IR IMAGING GUIDED PORT INSERTION: IMG5740

## 2022-06-01 MED ORDER — MIDAZOLAM HCL 2 MG/2ML IJ SOLN
INTRAMUSCULAR | Status: AC | PRN
  Administered 2022-06-01 (×2): 1 mg via INTRAVENOUS

## 2022-06-01 MED ORDER — HEPARIN SOD (PORK) LOCK FLUSH 100 UNIT/ML IV SOLN
INTRAVENOUS | Status: AC
  Filled 2022-06-01: qty 5

## 2022-06-01 MED ORDER — FENTANYL CITRATE (PF) 100 MCG/2ML IJ SOLN
INTRAMUSCULAR | Status: AC | PRN
  Administered 2022-06-01 (×2): 50 ug via INTRAVENOUS

## 2022-06-01 MED ORDER — SODIUM CHLORIDE 0.9 % IV SOLN: INTRAVENOUS | Status: DC

## 2022-06-01 MED ORDER — FENTANYL CITRATE (PF) 100 MCG/2ML IJ SOLN
INTRAMUSCULAR | Status: AC
  Filled 2022-06-01: qty 2

## 2022-06-01 MED ORDER — LIDOCAINE-EPINEPHRINE 1 %-1:100000 IJ SOLN
14.0000 mL | Freq: Once | INTRAMUSCULAR | Status: AC
  Administered 2022-06-01: 14 mL via INTRADERMAL

## 2022-06-01 MED ORDER — LIDOCAINE-EPINEPHRINE 1 %-1:100000 IJ SOLN
INTRAMUSCULAR | Status: AC
  Filled 2022-06-01: qty 1

## 2022-06-01 MED ORDER — MIDAZOLAM HCL 2 MG/2ML IJ SOLN
INTRAMUSCULAR | Status: AC
  Filled 2022-06-01: qty 2

## 2022-06-01 MED ORDER — HEPARIN SOD (PORK) LOCK FLUSH 100 UNIT/ML IV SOLN
500.0000 [IU] | Freq: Once | INTRAVENOUS | Status: AC
  Administered 2022-06-01: 500 [IU] via INTRAVENOUS
  Filled 2022-06-01: qty 5

## 2022-06-01 NOTE — H&P (Signed)
Chief Complaint: Patient was seen in consultation today for port placement.  Referring Physician(s): Allen,Lauren G  Supervising Physician: Juliet Rude  Patient Status: [facility] - Out-pt  History of Present Illness: Jaime Johnson is a 61 y.o. female with a past medical history significant for RA, HTN, HLD, colon cancer and endometrial cancer s/p total abdominal hysterectomy with BSO with SLND and perineal repair who presents today for port placement. Jaime Johnson began to have lower abdominal pain and postmenopausal bleeding earlier this year and was seen by ger PCP in April for this reason, she underwent an ultrasound on 02/22/22 which showed an endometrial echocomplex of approximately 20 mm. She underwent a hysteroscopy D&C on 03/06/22 and was found to have endometrioid adenocarcinoma. She underwent TAH with BSO on 03/30/22 with Dr. Rayford Halsted and was referred to medical oncology for chemotherapy. IR has been consulted for port placement for ongoing venous access.  Past Medical History:  Diagnosis Date   Allergic rhinitis due to pollen    Colon cancer ([place])    Hyperlipidemia LDL goal <100 02/16/2020   Hypertension 12/19/2016    Past Surgical History:  Procedure Laterality Date   CESAREAN SECTION     COLONOSCOPY WITH PROPOFOL N/A 11/12/2016   Procedure: COLONOSCOPY WITH PROPOFOL;  Surgeon: Lucilla Lame, MD;  Location: [place];  Service: Endoscopy;  Laterality: N/A;   COLONOSCOPY WITH PROPOFOL N/A 03/29/2017   Procedure: COLONOSCOPY WITH PROPOFOL;  Surgeon: Lucilla Lame, MD;  Location: [place];  Service: Endoscopy;  Laterality: N/A;   COLONOSCOPY WITH PROPOFOL N/A 03/14/2020   Procedure: COLONOSCOPY WITH PROPOFOL;  Surgeon: Lucilla Lame, MD;  Location: [place];  Service: Endoscopy;  Laterality: N/A;  priority 4   POLYPECTOMY  11/12/2016   Procedure: POLYPECTOMY;  Surgeon: Lucilla Lame, MD;  Location: [place];  Service: Endoscopy;;    POLYPECTOMY  03/29/2017   Procedure: POLYPECTOMY;  Surgeon: Lucilla Lame, MD;  Location: [place];  Service: Endoscopy;;    Allergies: Azithromycin  Medications: Prior to Admission medications   Medication Sig Start Date End Date Taking? Authorizing Provider  ferrous sulfate 325 (65 FE) MG tablet Take by mouth. 03/31/22 06/29/22  [provider]  lisinopril-hydrochlorothiazide (ZESTORETIC) 10-12.5 MG tablet Take 1 tablet by mouth daily. 03/22/21   Copland, Frederico Hamman, MD  Multiple Vitamin (QUINTABS) TABS Take 1 tablet by mouth daily.    [provider]  Multiple Vitamins-Minerals (MULTIVITAMIN GUMMIES ADULTS PO) Take by mouth daily.    [provider]  rosuvastatin (CRESTOR) 10 MG tablet Take 1 tablet (10 mg total) by mouth daily. 03/22/21   Copland, Frederico Hamman, MD  SYSTANE COMPLETE 0.6 % SOLN  09/24/19   [provider]  traMADol (ULTRAM) 50 MG tablet TAKE 1 TABLET(50 MG) BY MOUTH DAILY 01/29/22   Copland, Frederico Hamman, MD     Family History  Problem Relation Age of Onset   Hepatitis C Mother 62       d/c at 5   Hypertension Father    Hepatitis C Brother    Breast cancer Neg Hx     Social History   Socioeconomic History   Marital status: Married    Spouse name: Not on file   Number of children: Not on file   Years of education: Not on file   Highest education level: Not on file  Occupational History   Not on file  Tobacco Use   Smoking status: Former    Types: Cigarettes  Quit date: 10/22/1978    Years since quitting: 43.6   Smokeless tobacco: Never  Vaping Use   Vaping Use: Never used  Substance and Sexual Activity   Alcohol use: No   Drug use: No   Sexual activity: Yes    Partners: Male  Other Topics Concern   Not on file  Social History Narrative   Not on file   Social Determinants of Health   Financial Resource Strain: Not on file  Food Insecurity: Not on file  Transportation Needs: Not on file  Physical Activity: Not on file   Stress: Not on file  Social Connections: Not on file     Review of Systems: A 12 point ROS discussed and pertinent positives are indicated in the HPI above.  All other systems are negative.  Review of Systems  Constitutional:  Negative for chills and fever.  Respiratory:  Negative for cough and shortness of breath.   Cardiovascular:  Negative for chest pain.  Gastrointestinal:  Negative for abdominal pain, diarrhea, nausea and vomiting.  Musculoskeletal:  Negative for back pain.  Neurological:  Negative for dizziness and headaches.    Vital Signs: BP (!) 166/74   Pulse 63   Temp 97.6 F (36.4 C) (Oral)   Resp 14   Ht 5' 2.5" (1.588 m)   Wt 142 lb 8 oz (64.6 kg)   LMP 02/29/2020 (Approximate) Comment: Has had random spotting; hadn't had a period 1.5 yrs prior  SpO2 100%   BMI 25.65 kg/m   Physical Exam Vitals reviewed.  Constitutional:      General: She is not in acute distress.    Appearance: Normal appearance.  HENT:     Head: Normocephalic.     Mouth/Throat:     Mouth: Mucous membranes are moist.     Pharynx: Oropharynx is clear. No oropharyngeal exudate or posterior oropharyngeal erythema.  Cardiovascular:     Rate and Rhythm: Normal rate and regular rhythm.  Pulmonary:     Effort: Pulmonary effort is normal.     Breath sounds: Normal breath sounds.  Abdominal:     General: There is no distension.     Palpations: Abdomen is soft.     Tenderness: There is no abdominal tenderness.  Skin:    General: Skin is warm and dry.  Neurological:     Mental Status: She is alert and oriented to person, place, and time.  Psychiatric:        Mood and Affect: Mood normal.        Behavior: Behavior normal.        Thought Content: Thought content normal.        Judgment: Judgment normal.      MD Evaluation Airway: WNL Heart: WNL Abdomen: WNL Chest/ Lungs: WNL ASA  Classification: 2 Mallampati/Airway Score: One   Imaging: No results found.  Labs:  CBC: No  results for input(s): "WBC", "HGB", "HCT", "PLT" in the last 8760 hours.  COAGS: No results for input(s): "INR", "APTT" in the last 8760 hours.  BMP: No results for input(s): "NA", "K", "CL", "CO2", "GLUCOSE", "BUN", "CALCIUM", "CREATININE", "GFRNONAA", "GFRAA" in the last 8760 hours.  Invalid input(s): "CMP"  LIVER FUNCTION TESTS: No results for input(s): "BILITOT", "AST", "ALT", "ALKPHOS", "PROT", "ALBUMIN" in the last 8760 hours.  TUMOR MARKERS: No results for input(s): "AFPTM", "CEA", "CA199", "CHROMGRNA" in the last 8760 hours.  Assessment and Plan:  61 y/o F with recently diagnosed endometrial cancer s/p TAH and BSO who presents today  for port placement prior to beginning chemotherapy.  Risks and benefits of image-guided Port-a-catheter placement were discussed with the patient including, but not limited to bleeding, infection, pneumothorax, or fibrin sheath development and need for additional procedures.  All of the patient's questions were answered, patient is agreeable to proceed.  Consent signed and in chart.  Thank you for this interesting consult.  I greatly enjoyed meeting Jaime Johnson and look forward to participating in their care.  A copy of this report was sent to the requesting provider on this date.  Electronically Signed: Joaquim Nam, PA-C 06/01/2022, 8:12 AM   I spent a total of 30 Minutes  in face to face in clinical consultation, greater than 50% of which was counseling/coordinating care for port placement.

## 2022-06-01 NOTE — Procedures (Signed)
Interventional Radiology Procedure Note  Date of Procedure: 06/01/2022  Procedure: Port placement   Findings:  1. Port placement, right chest    Complications: No immediate complications noted.   Estimated Blood Loss: minimal  Follow-up and Recommendations: 1. Bedrest 1hour    Albin Felling, MD  Vascular & Interventional Radiology  06/01/2022 12:44 PM

## 2022-06-02 NOTE — Progress Notes (Unsigned)
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  INTERVAL HISTORY: Patient returns to clinic today for further evaluation, discussion of her final pathology results, and treatment planning.  She currently feels well and is asymptomatic.  She had no neurologic complaints.  She denies any recent fevers or illnesses.  She has a good appetite and denies weight loss.  She has no chest pain, shortness of breath, cough, or hemoptysis.  She denies any nausea, vomiting, constipation, or diarrhea.  She has no urinary complaints.  Patient feels at her baseline and offers no specific complaints today.  REVIEW OF SYSTEMS:   Review of Systems  Constitutional: Negative.  Negative for fever, malaise/fatigue and weight loss.  Respiratory: Negative.  Negative for cough, hemoptysis and shortness of breath.   Cardiovascular: Negative.  Negative for chest pain and leg swelling.  Gastrointestinal: Negative.  Negative for abdominal pain.  Genitourinary: Negative.  Negative for dysuria.  Musculoskeletal: Negative.  Negative for back pain.  Skin: Negative.  Negative for rash.  Neurological: Negative.  Negative for dizziness, focal weakness, weakness and headaches.  Psychiatric/Behavioral: Negative.  The patient is not nervous/anxious.     As per HPI. Otherwise, a complete review of systems is negative.  PAST MEDICAL HISTORY: Past Medical History:  Diagnosis Date   Allergic rhinitis due to pollen    Colon cancer ([place])    Hyperlipidemia LDL goal <100 02/16/2020   Hypertension 12/19/2016    PAST SURGICAL HISTORY: Past Surgical History:   Procedure Laterality Date   CESAREAN SECTION     COLONOSCOPY WITH PROPOFOL N/A 11/12/2016   Procedure: COLONOSCOPY WITH PROPOFOL;  Surgeon: Lucilla Lame, MD;  Location: [place];  Service: Endoscopy;  Laterality: N/A;   COLONOSCOPY WITH PROPOFOL N/A 03/29/2017   Procedure: COLONOSCOPY WITH PROPOFOL;  Surgeon: Lucilla Lame, MD;  Location: [place];  Service: Endoscopy;  Laterality: N/A;   COLONOSCOPY WITH PROPOFOL N/A 03/14/2020   Procedure: COLONOSCOPY WITH PROPOFOL;  Surgeon: Lucilla Lame, MD;  Location: [place];  Service: Endoscopy;  Laterality: N/A;  priority 4   IR IMAGING GUIDED PORT INSERTION  06/01/2022   POLYPECTOMY  11/12/2016   Procedure: POLYPECTOMY;  Surgeon: Lucilla Lame, MD;  Location: [place];  Service: Endoscopy;;   POLYPECTOMY  03/29/2017   Procedure: POLYPECTOMY;  Surgeon: Lucilla Lame, MD;  Location: [place];  Service: Endoscopy;;    FAMILY HISTORY: Family History  Problem Relation Age of Onset   Hepatitis C Mother 13       d/c at 20   Hypertension Father    Hepatitis C Brother    Breast cancer Neg Hx     ADVANCED DIRECTIVES (Y/N):  N  HEALTH MAINTENANCE: Social History   Tobacco Use   Smoking status: Former    Types: Cigarettes    Quit date: 10/22/1978    Years since quitting: 43.6   Smokeless tobacco: Never  Vaping Use   Vaping Use: Never used  Substance Use Topics   Alcohol use: No   Drug use: No     Colonoscopy:  PAP:  Bone density:  Lipid panel:  Allergies  Allergen Reactions   Azithromycin  Other (See Comments)    Gets a bad yeast infection from this.    Current Outpatient Medications  Medication Sig Dispense Refill   ferrous sulfate 325 (65 FE) MG tablet Take by mouth.     lisinopril-hydrochlorothiazide (ZESTORETIC) 10-12.5 MG tablet Take 1 tablet by mouth daily. 90 tablet 3   Multiple Vitamin (QUINTABS) TABS Take 1 tablet by mouth daily.     Multiple Vitamins-Minerals (MULTIVITAMIN  GUMMIES ADULTS PO) Take by mouth daily.     rosuvastatin (CRESTOR) 10 MG tablet Take 1 tablet (10 mg total) by mouth daily. 90 tablet 3   SYSTANE COMPLETE 0.6 % SOLN      traMADol (ULTRAM) 50 MG tablet TAKE 1 TABLET(50 MG) BY MOUTH DAILY 30 tablet 3   No current facility-administered medications for this visit.    OBJECTIVE: Vitals:   06/07/22 1352  BP: (!) 156/75  Pulse: 75  Temp: 98.1 F (36.7 C)     Body mass index is 25.74 kg/m.    ECOG FS:0 - Asymptomatic  General: Well-developed, well-nourished, no acute distress. Eyes: Pink conjunctiva, anicteric sclera. HEENT: Normocephalic, moist mucous membranes. Lungs: No audible wheezing or coughing. Heart: Regular rate and rhythm. Abdomen: Soft, nontender, no obvious distention. Musculoskeletal: No edema, cyanosis, or clubbing. Neuro: Alert, answering all questions appropriately. Cranial nerves grossly intact. Skin: No rashes or petechiae noted. Psych: Normal affect. Lymphatics: No cervical, calvicular, axillary or inguinal LAD.   LAB RESULTS:  Lab Results  Component Value Date   NA 142 03/14/2021   K 4.2 03/14/2021   CL 104 03/14/2021   CO2 29 03/14/2021   GLUCOSE 107 (H) 03/14/2021   BUN 16 03/14/2021   CREATININE 0.86 03/14/2021   CALCIUM 9.6 03/14/2021   PROT 7.8 03/14/2021   ALBUMIN 4.7 02/08/2020   AST 24 03/14/2021   ALT 31 (H) 03/14/2021   ALKPHOS 57 02/08/2020   BILITOT 0.6 03/14/2021    Lab Results  Component Value Date   WBC 4.8 03/14/2021   NEUTROABS 3,158 03/14/2021   HGB 14.5 03/14/2021   HCT 43.8 03/14/2021   MCV 81.9 03/14/2021   PLT 290 03/14/2021     STUDIES: NM PET Image Restag (PS) Skull Base To Thigh  Result Date: 06/05/2022 CLINICAL DATA:  Subsequent treatment strategy for endometrial carcinoma status post hysterectomy March 30, 2022. EXAM: NUCLEAR MEDICINE PET SKULL BASE TO THIGH TECHNIQUE: 8.1 mCi F-18 FDG was injected intravenously. Full-ring PET imaging was performed from the skull  base to thigh after the radiotracer. CT data was obtained and used for attenuation correction and anatomic localization. Fasting blood glucose: 100 mg/dl COMPARISON:  None Available. FINDINGS: Mediastinal blood pool activity: SUV max 1.73 Liver activity: SUV max NA NECK: No hypermetabolic cervical adenopathy. Homogeneous hypermetabolic activity throughout the thyroid gland with a max SUV of 3.3, commonly reflects thyroiditis. Hypodense 14 mm nodule in the right lobe of the thyroid which demonstrates some peripheral calcifications and activity slightly greater than that of background thyroid with a max SUV of 4.9. Incidental CT findings: None. CHEST: No hypermetabolic thoracic adenopathy. No hypermetabolic pulmonary nodules or masses. Incidental CT findings: Right chest Port-A-Cath with tip near the superior cavoatrial junction. No suspicious pulmonary nodules on this motion degraded examination. ABDOMEN/PELVIS: Prior hysterectomy without suspicious hypermetabolic soft tissue nodularity along the vaginal cuff. No abnormal hypermetabolic activity within the liver, pancreas, adrenal glands, or spleen. No hypermetabolic lymph nodes in the abdomen or pelvis. Incidental CT findings: Mild mesenteric and omental stranding without FDG avid nodularity. SKELETON:  No focal hypermetabolic activity to suggest skeletal metastasis. Incidental CT findings: None. IMPRESSION: 1. Status post hysterectomy without suspicious hypermetabolic soft tissue nodularity along the vaginal cuff to suggest residual disease. 2. No convincing evidence of hypermetabolic metastatic disease. 3. Mild mesenteric and omental stranding without abnormal FDG avid nodularity possibly reflecting postsurgical change, suggest attention on follow-up imaging. 4. Incidental 1.4 cm right thyroid nodule with slightly increased uptake. Recommend nonemergent thyroid US and biopsy of nodule if detected. Reference: J Am Coll Radiol. 2015 Feb;12(2): 143-50 5. Slightly  increased background thyroid activity is nonspecific but may reflect thyroiditis recommend correlation with laboratory values. 6.  Aortic Atherosclerosis (ICD10-I70.0). Electronically Signed   By: Dahlia Bailiff M.D.   On: 06/05/2022 12:56   IR IMAGING GUIDED PORT INSERTION  Result Date: 06/01/2022 INDICATION: Chemotherapy infusion EXAM: Port placement using ultrasound and fluoroscopic guidance MEDICATIONS: Per EMR ANESTHESIA/SEDATION: Moderate (conscious) sedation was employed during this procedure. A total of Versed 2 mg and Fentanyl 100 mcg was administered intravenously. Moderate Sedation Time: 34 minutes. The patient's level of consciousness and vital signs were monitored continuously by radiology nursing throughout the procedure under my direct supervision. FLUOROSCOPY TIME:  Fluoroscopy Time: 0.3 minutes (1.3 mGy) COMPLICATIONS: None immediate. PROCEDURE: Informed written consent was obtained from the patient after a thorough discussion of the procedural risks, benefits and alternatives. All questions were addressed. Maximal Sterile Barrier Technique was utilized including caps, mask, sterile gowns, sterile gloves, sterile drape, hand hygiene and skin antiseptic. A timeout was performed prior to the initiation of the procedure. The patient was placed supine on the exam table. The right neck and chest was prepped and draped in the standard sterile fashion. A preliminary ultrasound of the right neck was performed and demonstrates a patent right internal jugular vein. A permanent ultrasound image was stored in the electronic medical record. The overlying skin was anesthetized with 1% Lidocaine. Using ultrasound guidance, access was obtained into the right internal jugular vein using a 21 gauge micropuncture set. A wire was advanced into the SVC, a short incision was made at the puncture site, and serial dilatation performed. Next, in an ipsilateral infraclavicular location, an incision was made at the site  of the subcutaneous reservoir. Blunt dissection was used to open a pocket to contain the reservoir. A subcutaneous tunnel was then created from the port site to the puncture site. A(n) 8 Fr single lumen catheter was advanced through the tunnel. The catheter was attached to the port and this was placed in the subcutaneous pocket. Under fluoroscopic guidance, a peel away sheath was placed, and the catheter was trimmed to the appropriate length and was advanced into the central veins. The catheter length is 22 cm. The tip of the catheter lies near the superior cavoatrial junction. The port flushes and aspirates appropriately. The port was flushed and locked with heparinized saline. The port pocket was closed in 2 layers using 3-0 and 4-0 Vicryl/absorbable suture. Dermabond was also applied to both incisions. The patient tolerated the procedure well and was transferred to recovery in stable condition. IMPRESSION: Successful placement of a right-sided chest port via the right internal jugular vein. The port is ready for immediate use. Electronically Signed   By: Albin Felling M.D.   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  Because of this, recommendation is to proceed with adjuvant chemotherapy using carboplatin and Taxol every 3 weeks.  PET scan results from June 05, 2022 reviewed independently and reported as above with hypermetabolic soft tissue nodularity along the vaginal, suggestive of residual disease.  Patient has already had port placement.  Return to clinic next week for further evaluation and consideration of cycle 1. Thyroid nodule: 1.4 cm nodule noted on PET scan with slightly increased uptake.  Recommendation  is a nonemergent ultrasound with biopsy.  I spent a total of 30 minutes reviewing chart data, face-to-face evaluation with the patient, counseling and coordination of care as detailed above.   Patient expressed understanding and was in agreement with this plan. She also understands that She can call clinic at any time with any questions, concerns, or complaints.    Cancer Staging  Endometrial carcinoma [facility]) Staging form: Corpus Uteri - Carcinoma and Carcinosarcoma, AJCC 8th Edition - Pathologic stage from 05/22/2022: FIGO Stage IB (pT1b, pN0(i+), cM0) - Signed by Lloyd Huger, MD on 06/07/2022 Histologic grade (G): G2 Histologic grading system: 3 grade system Residual tumor (R): R0 - None   Lloyd Huger, MD   06/07/2022 4:19 PM

## 2022-06-04 ENCOUNTER — Inpatient Hospital Stay: Payer: Self-pay

## 2022-06-04 ENCOUNTER — Encounter (HOSPITAL_COMMUNITY)
Admission: RE | Admit: 2022-06-04 | Discharge: 2022-06-04 | Disposition: A | Discharge status: Home / Self Care | Payer: Self-pay | Source: Ambulatory Visit | Attending: Nurse Practitioner | Admitting: Nurse Practitioner

## 2022-06-04 DIAGNOSIS — C541 Malignant neoplasm of endometrium: Secondary | ICD-10-CM | POA: Insufficient documentation

## 2022-06-04 LAB — GLUCOSE, CAPILLARY: Glucose-Capillary: 100 mg/dL — ABNORMAL HIGH (ref 70–99)

## 2022-06-04 MED ORDER — FLUDEOXYGLUCOSE F - 18 (FDG) INJECTION
7.1000 | Freq: Once | INTRAVENOUS | Status: AC
  Administered 2022-06-04: 8.1 via INTRAVENOUS

## 2022-06-06 ENCOUNTER — Inpatient Hospital Stay (HOSPITAL_BASED_OUTPATIENT_CLINIC_OR_DEPARTMENT_OTHER): Payer: Self-pay | Admitting: Hospice and Palliative Medicine

## 2022-06-06 ENCOUNTER — Ambulatory Visit: Payer: Disability Insurance | Admitting: Oncology

## 2022-06-06 ENCOUNTER — Other Ambulatory Visit: Payer: Disability Insurance

## 2022-06-06 DIAGNOSIS — C541 Malignant neoplasm of endometrium: Secondary | ICD-10-CM

## 2022-06-06 NOTE — Progress Notes (Signed)
Multidisciplinary Oncology Council Documentation  Jaime Johnson was presented by our [facility] on 06/06/2022, which included representatives from:  Palliative Care Dietitian  Physical/Occupational Therapist Nurse Navigator Genetics Speech Therapist Social work Survivorship RN Financial Navigator Research RN   Jaime Johnson currently presents with history of endometrial cancer  We reviewed previous medical and familial history, history of present illness, and recent lab results along with all available histopathologic and imaging studies. The Jaime Johnson considered available treatment options and made the following recommendations/referrals:  SW, nutrition   The MOC is a meeting of clinicians from various specialty areas who evaluate and discuss patients for whom a multidisciplinary approach is being considered. Final determinations in the plan of care are those of the provider(s).   Today's extended care, comprehensive team conference, Jaime Johnson was not present for the discussion and was not examined.

## 2022-06-07 ENCOUNTER — Inpatient Hospital Stay (HOSPITAL_BASED_OUTPATIENT_CLINIC_OR_DEPARTMENT_OTHER): Payer: Self-pay | Admitting: Oncology

## 2022-06-07 ENCOUNTER — Encounter: Payer: Self-pay | Admitting: Oncology

## 2022-06-07 ENCOUNTER — Ambulatory Visit: Payer: Disability Insurance

## 2022-06-07 ENCOUNTER — Telehealth: Payer: Self-pay | Admitting: *Deleted

## 2022-06-07 VITALS — BP 156/75 | HR 75 | Temp 98.1°F | Wt 143.0 lb

## 2022-06-07 DIAGNOSIS — C541 Malignant neoplasm of endometrium: Secondary | ICD-10-CM

## 2022-06-07 MED ORDER — ONDANSETRON HCL 8 MG PO TABS: 8.0000 mg | ORAL_TABLET | Freq: Three times a day (TID) | ORAL | 2 refills | Status: DC | PRN

## 2022-06-07 MED ORDER — LIDOCAINE-PRILOCAINE 2.5-2.5 % EX CREA: TOPICAL_CREAM | CUTANEOUS | 3 refills | Status: DC

## 2022-06-07 MED ORDER — PROCHLORPERAZINE MALEATE 10 MG PO TABS: 10.0000 mg | ORAL_TABLET | Freq: Four times a day (QID) | ORAL | 2 refills | Status: DC | PRN

## 2022-06-07 NOTE — Telephone Encounter (Signed)
Received incoming referral from [facility] for SW, nutrition

## 2022-06-07 NOTE — Telephone Encounter (Signed)
Apt has already been scheduled for nutrition.

## 2022-06-07 NOTE — Progress Notes (Signed)
START ON PATHWAY REGIMEN - Uterine     A cycle is every 21 days:     Paclitaxel      Carboplatin   **Always confirm dose/schedule in your pharmacy ordering system**  Patient Characteristics: Endometrioid, Newly Diagnosed, Postoperative (Pathologic Staging), Stage IIIA - Grade 1, 2, or 3, MSS/pMMR Histology: Endometrioid Therapeutic Status: Newly Diagnosed, Postoperative (Pathologic Staging) AJCC M Category: cM0 AJCC 8 Stage Grouping: IIIA AJCC T Category: pT3a AJCC N Category: pN0 Microsatellite/Mismatch Repair Status: MSS/pMMR Intent of Therapy: Curative Intent, Discussed with Patient

## 2022-06-08 ENCOUNTER — Encounter: Payer: Self-pay | Admitting: Licensed Clinical Social Worker

## 2022-06-08 ENCOUNTER — Other Ambulatory Visit: Payer: Self-pay

## 2022-06-08 NOTE — Progress Notes (Signed)
[facility] Clinical Social Work  Clinical Social Work was referred by medical provider for assessment of psychosocial needs.  Clinical Social Worker attempted to contact patient by phone  to offer support and assess for needs.  CSW left voicemail with contact information and request for a return call.   FA  Ayako Tapanes, LCSW  Clinical Social Worker [facility]          

## 2022-06-10 ENCOUNTER — Other Ambulatory Visit: Payer: Self-pay

## 2022-06-11 ENCOUNTER — Ambulatory Visit: Payer: Disability Insurance | Admitting: Oncology

## 2022-06-11 ENCOUNTER — Other Ambulatory Visit: Payer: Disability Insurance

## 2022-06-12 ENCOUNTER — Ambulatory Visit: Payer: Disability Insurance

## 2022-06-13 ENCOUNTER — Encounter: Payer: Self-pay | Admitting: Oncology

## 2022-06-13 ENCOUNTER — Inpatient Hospital Stay: Payer: Self-pay

## 2022-06-13 ENCOUNTER — Inpatient Hospital Stay (HOSPITAL_BASED_OUTPATIENT_CLINIC_OR_DEPARTMENT_OTHER): Payer: Self-pay | Admitting: Oncology

## 2022-06-13 VITALS — BP 140/71 | HR 68 | Temp 97.0°F | Resp 16 | Wt 144.8 lb

## 2022-06-13 DIAGNOSIS — Z5111 Encounter for antineoplastic chemotherapy: Secondary | ICD-10-CM

## 2022-06-13 DIAGNOSIS — C541 Malignant neoplasm of endometrium: Secondary | ICD-10-CM

## 2022-06-13 DIAGNOSIS — D509 Iron deficiency anemia, unspecified: Secondary | ICD-10-CM

## 2022-06-13 DIAGNOSIS — Z95828 Presence of other vascular implants and grafts: Secondary | ICD-10-CM

## 2022-06-13 LAB — CBC WITH DIFFERENTIAL/PLATELET
Abs Immature Granulocytes: 0 10*3/uL (ref 0.00–0.07)
Basophils Absolute: 0 10*3/uL (ref 0.0–0.1)
Basophils Relative: 1 %
Eosinophils Absolute: 0.1 10*3/uL (ref 0.0–0.5)
Eosinophils Relative: 2 %
HCT: 41.2 % (ref 36.0–46.0)
Hemoglobin: 13.3 g/dL (ref 12.0–15.0)
Immature Granulocytes: 0 %
Lymphocytes Relative: 38 %
Lymphs Abs: 1.7 10*3/uL (ref 0.7–4.0)
MCH: 25.6 pg — ABNORMAL LOW (ref 26.0–34.0)
MCHC: 32.3 g/dL (ref 30.0–36.0)
MCV: 79.4 fL — ABNORMAL LOW (ref 80.0–100.0)
Monocytes Absolute: 0.3 10*3/uL (ref 0.1–1.0)
Monocytes Relative: 7 %
Neutro Abs: 2.3 10*3/uL (ref 1.7–7.7)
Neutrophils Relative %: 52 %
Platelets: 247 10*3/uL (ref 150–400)
RBC: 5.19 MIL/uL — ABNORMAL HIGH (ref 3.87–5.11)
RDW: 14.6 % (ref 11.5–15.5)
WBC: 4.4 10*3/uL (ref 4.0–10.5)
nRBC: 0 % (ref 0.0–0.2)

## 2022-06-13 LAB — COMPREHENSIVE METABOLIC PANEL
ALT: 21 U/L (ref 0–44)
AST: 25 U/L (ref 15–41)
Albumin: 4.3 g/dL (ref 3.5–5.0)
Alkaline Phosphatase: 52 U/L (ref 38–126)
Anion gap: 9 (ref 5–15)
BUN: 13 mg/dL (ref 8–23)
CO2: 27 mmol/L (ref 22–32)
Calcium: 9.5 mg/dL (ref 8.9–10.3)
Chloride: 104 mmol/L (ref 98–111)
Creatinine, Ser: 0.77 mg/dL (ref 0.44–1.00)
GFR, Estimated: >60 mL/min (ref >60)
Glucose, Bld: 102 mg/dL — ABNORMAL HIGH (ref 70–99)
Potassium: 3.8 mmol/L (ref 3.5–5.1)
Sodium: 140 mmol/L (ref 135–145)
Total Bilirubin: 0.5 mg/dL (ref 0.3–1.2)
Total Protein: 7.7 g/dL (ref 6.5–8.1)

## 2022-06-13 MED ORDER — SODIUM CHLORIDE 0.9% FLUSH
10.0000 mL | Freq: Once | INTRAVENOUS | Status: AC
  Administered 2022-06-13: 10 mL via INTRAVENOUS
  Filled 2022-06-13: qty 10

## 2022-06-13 MED ORDER — HEPARIN SOD (PORK) LOCK FLUSH 100 UNIT/ML IV SOLN
500.0000 [IU] | Freq: Once | INTRAVENOUS | Status: AC
  Administered 2022-06-13: 500 [IU] via INTRAVENOUS
  Filled 2022-06-13: qty 5

## 2022-06-13 MED FILL — Dexamethasone Sodium Phosphate Inj 100 MG/10ML: INTRAMUSCULAR | Qty: 1 | Status: AC

## 2022-06-13 MED FILL — Fosaprepitant Dimeglumine For IV Infusion 150 MG (Base Eq): INTRAVENOUS | Qty: 5 | Status: AC

## 2022-06-13 NOTE — Progress Notes (Unsigned)
Pt states she has pulled a muscle in her back about four days ago; due to port insertion and not trying to use the rt side of her body.

## 2022-06-14 ENCOUNTER — Inpatient Hospital Stay: Payer: Self-pay

## 2022-06-14 ENCOUNTER — Other Ambulatory Visit: Payer: Self-pay

## 2022-06-14 ENCOUNTER — Other Ambulatory Visit: Payer: Disability Insurance

## 2022-06-14 ENCOUNTER — Encounter: Payer: Self-pay | Admitting: Oncology

## 2022-06-14 VITALS — BP 138/62 | HR 72 | Temp 96.9°F | Resp 18

## 2022-06-14 DIAGNOSIS — C541 Malignant neoplasm of endometrium: Secondary | ICD-10-CM

## 2022-06-14 LAB — SLIDE CONSULT, PATHOLOGY ARMC

## 2022-06-14 MED ORDER — SODIUM CHLORIDE 0.9 % IV SOLN
10.0000 mg | Freq: Once | INTRAVENOUS | Status: AC
  Administered 2022-06-14: 10 mg via INTRAVENOUS
  Filled 2022-06-14: qty 10

## 2022-06-14 MED ORDER — DIPHENHYDRAMINE HCL 50 MG/ML IJ SOLN
25.0000 mg | Freq: Once | INTRAMUSCULAR | Status: AC
  Administered 2022-06-14: 25 mg via INTRAVENOUS
  Filled 2022-06-14: qty 1

## 2022-06-14 MED ORDER — FAMOTIDINE IN NACL 20-0.9 MG/50ML-% IV SOLN
20.0000 mg | Freq: Once | INTRAVENOUS | Status: AC
  Administered 2022-06-14: 20 mg via INTRAVENOUS
  Filled 2022-06-14: qty 50

## 2022-06-14 MED ORDER — SODIUM CHLORIDE 0.9 % IV SOLN
300.0000 mg | Freq: Once | INTRAVENOUS | Status: AC
  Administered 2022-06-14: 300 mg via INTRAVENOUS
  Filled 2022-06-14: qty 50

## 2022-06-14 MED ORDER — PALONOSETRON HCL INJECTION 0.25 MG/5ML
0.2500 mg | Freq: Once | INTRAVENOUS | Status: AC
  Administered 2022-06-14: 0.25 mg via INTRAVENOUS
  Filled 2022-06-14: qty 5

## 2022-06-14 MED ORDER — SODIUM CHLORIDE 0.9 % IV SOLN
498.5000 mg | Freq: Once | INTRAVENOUS | Status: AC
  Administered 2022-06-14: 500 mg via INTRAVENOUS
  Filled 2022-06-14: qty 50

## 2022-06-14 MED ORDER — SODIUM CHLORIDE 0.9 % IV SOLN
Freq: Once | INTRAVENOUS | Status: AC
  Filled 2022-06-14: qty 250

## 2022-06-14 MED ORDER — SODIUM CHLORIDE 0.9% FLUSH
10.0000 mL | INTRAVENOUS | Status: DC | PRN
  Administered 2022-06-14: 10 mL
  Filled 2022-06-14: qty 10

## 2022-06-14 MED ORDER — SODIUM CHLORIDE 0.9 % IV SOLN
150.0000 mg | Freq: Once | INTRAVENOUS | Status: AC
  Administered 2022-06-14: 150 mg via INTRAVENOUS
  Filled 2022-06-14: qty 150

## 2022-06-14 MED ORDER — HEPARIN SOD (PORK) LOCK FLUSH 100 UNIT/ML IV SOLN
500.0000 [IU] | Freq: Once | INTRAVENOUS | Status: AC | PRN
  Administered 2022-06-14: 500 [IU]
  Filled 2022-06-14: qty 5

## 2022-06-14 NOTE — Progress Notes (Signed)
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 Claiborne Billings from GYN oncology at [facility].  She underwent robotic surgery on 03/30/2022.  Final pathology showed:  Final Pathologic Diagnosis  A. SENTINEL LYMPH NODE, RIGHT EXTERNAL ILIAC ARTERY, RESECTION: Two lymph nodes, negative for metastatic carcinoma (0/2).    B. SENTINEL LYMPH NODE, LEFT EXTERNAL ILIAC ARTERY, RESECTION: One lymph node, negative for metastatic carcinoma (0/1).    C. UTERUS, FALLOPIAN TUBE AND OVARIES, HYSTERECTOMY WITH BILATERAL SALPINGO-OOPHORECTOMY: Endometrioid adenocarcinoma, FIGO grade 2. Tumor measures 5 cm in greatest dimension.  Tumor invades 14 of 17 mm thick myometrium (greater than 50%).  Margins of resection are uninvolved by tumor.  Tumor involves lower uterine segment.  Cervix is uninvolved by tumor.  Benign right fallopian tube and ovary.  Left ovary with tumor located within lymphovascular spaces, no tissue invasion identified.  Left fallopian tube with tumor within lymphovascular spaces, no tissue invasion identified. Free floating tumor located inside left fallopian tube.  pT1b pN0      She was recommended adjuvant chemotherapy with Norma Fredrickson Taxol for 3 cycles by Dr. Claiborne Billings followed by vaginal  brachytherapy.  Patient states that she does not wish to travel all the way to [facility] given her ongoing fatigue as well as discomfort from episiotomy and would prefer to get treatment locally.  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  She is not a diabetic.  She has baseline rheumatoid arthritis    Interval history-she is doing well overall.  Fatigue is improving.  She has mild neuropathy in her feet which is intermittent and well controlled without medications  ECOG PS- 1 Pain scale- 0   Review of systems- Review of Systems  Constitutional:  Positive for malaise/fatigue. Negative for chills, fever and weight loss.  HENT:  Negative for congestion, ear discharge and nosebleeds.   Eyes:  Negative for blurred vision.  Respiratory:  Negative for cough, hemoptysis, sputum production, shortness of breath and wheezing.   Cardiovascular:  Negative for chest pain, palpitations, orthopnea and claudication.  Gastrointestinal:  Negative for abdominal pain, blood in stool, constipation, diarrhea, heartburn, melena, nausea and vomiting.  Genitourinary:  Negative for dysuria, flank pain, frequency, hematuria and urgency.  Musculoskeletal:  Negative for back pain, joint pain and myalgias.  Skin:  Negative for rash.  Neurological:  Negative for dizziness, tingling, focal weakness, seizures, weakness and headaches.  Endo/Heme/Allergies:  Does not bruise/bleed easily.  Psychiatric/Behavioral:  Negative for depression and suicidal ideas. The patient does not have insomnia.       Allergies  Allergen Reactions   Azithromycin Other (See Comments)    Gets a bad yeast infection from this.     Past Medical History:  Diagnosis Date   Allergic rhinitis due to pollen    Colon cancer ([place])    Hyperlipidemia LDL goal <100 02/16/2020   Hypertension  12/19/2016     Past Surgical History:  Procedure Laterality Date   CESAREAN SECTION     COLONOSCOPY WITH PROPOFOL N/A 11/12/2016    Procedure: COLONOSCOPY WITH PROPOFOL;  Surgeon: Lucilla Lame, MD;  Location: [place];  Service: Endoscopy;  Laterality: N/A;   COLONOSCOPY WITH PROPOFOL N/A 03/29/2017   Procedure: COLONOSCOPY WITH PROPOFOL;  Surgeon: Lucilla Lame, MD;  Location: [place];  Service: Endoscopy;  Laterality: N/A;   COLONOSCOPY WITH PROPOFOL N/A 03/14/2020   Procedure: COLONOSCOPY WITH PROPOFOL;  Surgeon: Lucilla Lame, MD;  Location: [place];  Service: Endoscopy;  Laterality: N/A;  priority 4   IR IMAGING GUIDED PORT INSERTION  06/01/2022   POLYPECTOMY  11/12/2016   Procedure: POLYPECTOMY;  Surgeon: Lucilla Lame, MD;  Location: [place];  Service: Endoscopy;;   POLYPECTOMY  03/29/2017   Procedure: POLYPECTOMY;  Surgeon: Lucilla Lame, MD;  Location: [place];  Service: Endoscopy;;    Social History   Socioeconomic History   Marital status: Married    Spouse name: Not on file   Number of children: Not on file   Years of education: Not on file   Highest education level: Not on file  Occupational History   Not on file  Tobacco Use   Smoking status: Former    Types: Cigarettes    Quit date: 10/22/1978    Years since quitting: 43.6   Smokeless tobacco: Never  Vaping Use   Vaping Use: Never used  Substance and Sexual Activity   Alcohol use: No   Drug use: No   Sexual activity: Yes    Partners: Male  Other Topics Concern   Not on file  Social History Narrative   Not on file   Social Determinants of Health   Financial Resource Strain: Not on file  Food Insecurity: Not on file  Transportation Needs: Not on file  Physical Activity: Not on file  Stress: Not on file  Social Connections: Not on file  Intimate Partner Violence: Not on file    Family History  Problem Relation Age of Onset   Hepatitis C Mother 73       d/c at 47   Hypertension Father    Hepatitis C Brother    Breast cancer Neg Hx      Current Outpatient Medications:    albuterol  (VENTOLIN HFA) 108 (90 Base) MCG/ACT inhaler, Inhale 2 puffs into the lungs every 6 (six) hours as needed., Disp: , Rfl:    ferrous sulfate 325 (65 FE) MG tablet, Take by mouth., Disp: , Rfl:    lidocaine-prilocaine (EMLA) cream, Apply to affected area once, Disp: 30 g, Rfl: 3   lisinopril-hydrochlorothiazide (ZESTORETIC) 10-12.5 MG tablet, Take 1 tablet by mouth daily., Disp: 90 tablet, Rfl: 3   Multiple Vitamin (QUINTABS) TABS, Take 1 tablet by mouth daily., Disp: , Rfl:    Multiple Vitamins-Minerals (MULTIVITAMIN GUMMIES ADULTS PO), Take by mouth daily., Disp: , Rfl:    SYSTANE COMPLETE 0.6 % SOLN, , Disp: , Rfl:    traMADol (ULTRAM) 50 MG tablet, TAKE 1 TABLET(50 MG) BY MOUTH DAILY, Disp: 30 tablet, Rfl: 3   ibuprofen (ADVIL) 600 MG tablet, Take 600 mg by mouth every 6 (six) hours as needed. (Patient not taking: Reported on 06/13/2022), Disp: , Rfl:    ondansetron (ZOFRAN) 8 MG tablet, Take 1 tablet (8 mg total) by mouth every 8 (eight) hours as needed for nausea or vomiting. (Patient not taking: Reported on 06/13/2022), Disp:  60 tablet, Rfl: 2   ondansetron (ZOFRAN-ODT) 8 MG disintegrating tablet, Take 8 mg by mouth every 8 (eight) hours as needed. (Patient not taking: Reported on 06/13/2022), Disp: , Rfl:    prochlorperazine (COMPAZINE) 10 MG tablet, Take 1 tablet (10 mg total) by mouth every 6 (six) hours as needed for nausea or vomiting. (Patient not taking: Reported on 06/13/2022), Disp: 60 tablet, Rfl: 2   rosuvastatin (CRESTOR) 10 MG tablet, Take 1 tablet (10 mg total) by mouth daily. (Patient not taking: Reported on 06/13/2022), Disp: 90 tablet, Rfl: 3 No current facility-administered medications for this visit.  Facility-Administered Medications Ordered in Other Visits:    CARBOplatin (PARAPLATIN) 500 mg in sodium chloride 0.9 % 250 mL chemo infusion, 500 mg, Intravenous, Once, Sindy Guadeloupe, MD   heparin lock flush 100 unit/mL, 500 Units, Intracatheter, Once PRN, Grayland Ormond, Kathlene November, MD    PACLitaxel (TAXOL) 300 mg in sodium chloride 0.9 % 250 mL chemo infusion (> '80mg'$ /m2), 300 mg, Intravenous, Once, Finnegan, Kathlene November, MD   sodium chloride flush (NS) 0.9 % injection 10 mL, 10 mL, Intracatheter, PRN, Lloyd Huger, MD, 10 mL at 06/14/22 0810  Physical exam:  Vitals:   06/13/22 1311  BP: (!) 140/71  Pulse: 68  Resp: 16  Temp: (!) 97 F (36.1 C)  SpO2: 98%  Weight: 144 lb 12.8 oz (65.7 kg)   Physical Exam Constitutional:      General: She is not in acute distress. Cardiovascular:     Rate and Rhythm: Normal rate and regular rhythm.     Heart sounds: Normal heart sounds.  Pulmonary:     Effort: Pulmonary effort is normal.  Skin:    General: Skin is warm and dry.  Neurological:     Mental Status: She is alert and oriented to person, place, and time.         Latest Ref Rng & Units 06/13/2022   12:41 PM  CMP  Glucose 70 - 99 mg/dL 102   BUN 8 - 23 mg/dL 13   Creatinine 0.44 - 1.00 mg/dL 0.77   Sodium 135 - 145 mmol/L 140   Potassium 3.5 - 5.1 mmol/L 3.8   Chloride 98 - 111 mmol/L 104   CO2 22 - 32 mmol/L 27   Calcium 8.9 - 10.3 mg/dL 9.5   Total Protein 6.5 - 8.1 g/dL 7.7   Total Bilirubin 0.3 - 1.2 mg/dL 0.5   Alkaline Phos 38 - 126 U/L 52   AST 15 - 41 U/L 25   ALT 0 - 44 U/L 21       Latest Ref Rng & Units 06/13/2022   12:41 PM  CBC  WBC 4.0 - 10.5 K/uL 4.4   Hemoglobin 12.0 - 15.0 g/dL 13.3   Hematocrit 36.0 - 46.0 % 41.2   Platelets 150 - 400 K/uL 247     No images are attached to the encounter.  NM PET Image Restag (PS) Skull Base To Thigh  Result Date: 06/05/2022 CLINICAL DATA:  Subsequent treatment strategy for endometrial carcinoma status post hysterectomy March 30, 2022. EXAM: NUCLEAR MEDICINE PET SKULL BASE TO THIGH TECHNIQUE: 8.1 mCi F-18 FDG was injected intravenously. Full-ring PET imaging was performed from the skull base to thigh after the radiotracer. CT data was obtained and used for attenuation correction and anatomic  localization. Fasting blood glucose: 100 mg/dl COMPARISON:  None Available. FINDINGS: Mediastinal blood pool activity: SUV max 1.73 Liver activity: SUV max NA NECK: No hypermetabolic cervical  adenopathy. Homogeneous hypermetabolic activity throughout the thyroid gland with a max SUV of 3.3, commonly reflects thyroiditis. Hypodense 14 mm nodule in the right lobe of the thyroid which demonstrates some peripheral calcifications and activity slightly greater than that of background thyroid with a max SUV of 4.9. Incidental CT findings: None. CHEST: No hypermetabolic thoracic adenopathy. No hypermetabolic pulmonary nodules or masses. Incidental CT findings: Right chest Port-A-Cath with tip near the superior cavoatrial junction. No suspicious pulmonary nodules on this motion degraded examination. ABDOMEN/PELVIS: Prior hysterectomy without suspicious hypermetabolic soft tissue nodularity along the vaginal cuff. No abnormal hypermetabolic activity within the liver, pancreas, adrenal glands, or spleen. No hypermetabolic lymph nodes in the abdomen or pelvis. Incidental CT findings: Mild mesenteric and omental stranding without FDG avid nodularity. SKELETON: No focal hypermetabolic activity to suggest skeletal metastasis. Incidental CT findings: None. IMPRESSION: 1. Status post hysterectomy without suspicious hypermetabolic soft tissue nodularity along the vaginal cuff to suggest residual disease. 2. No convincing evidence of hypermetabolic metastatic disease. 3. Mild mesenteric and omental stranding without abnormal FDG avid nodularity possibly reflecting postsurgical change, suggest attention on follow-up imaging. 4. Incidental 1.4 cm right thyroid nodule with slightly increased uptake. Recommend nonemergent thyroid US and biopsy of nodule if detected. Reference: J Am Coll Radiol. 2015 Feb;12(2): 143-50 5. Slightly increased background thyroid activity is nonspecific but may reflect thyroiditis recommend correlation with  laboratory values. 6.  Aortic Atherosclerosis (ICD10-I70.0). Electronically Signed   By: Dahlia Bailiff M.D.   On: 06/05/2022 12:56   IR IMAGING GUIDED PORT INSERTION  Result Date: 06/01/2022 INDICATION: Chemotherapy infusion EXAM: Port placement using ultrasound and fluoroscopic guidance MEDICATIONS: Per EMR ANESTHESIA/SEDATION: Moderate (conscious) sedation was employed during this procedure. A total of Versed 2 mg and Fentanyl 100 mcg was administered intravenously. Moderate Sedation Time: 34 minutes. The patient's level of consciousness and vital signs were monitored continuously by radiology nursing throughout the procedure under my direct supervision. FLUOROSCOPY TIME:  Fluoroscopy Time: 0.3 minutes (1.3 mGy) COMPLICATIONS: None immediate. PROCEDURE: Informed written consent was obtained from the patient after a thorough discussion of the procedural risks, benefits and alternatives. All questions were addressed. Maximal Sterile Barrier Technique was utilized including caps, mask, sterile gowns, sterile gloves, sterile drape, hand hygiene and skin antiseptic. A timeout was performed prior to the initiation of the procedure. The patient was placed supine on the exam table. The right neck and chest was prepped and draped in the standard sterile fashion. A preliminary ultrasound of the right neck was performed and demonstrates a patent right internal jugular vein. A permanent ultrasound image was stored in the electronic medical record. The overlying skin was anesthetized with 1% Lidocaine. Using ultrasound guidance, access was obtained into the right internal jugular vein using a 21 gauge micropuncture set. A wire was advanced into the SVC, a short incision was made at the puncture site, and serial dilatation performed. Next, in an ipsilateral infraclavicular location, an incision was made at the site of the subcutaneous reservoir. Blunt dissection was used to open a pocket to contain the reservoir. A  subcutaneous tunnel was then created from the port site to the puncture site. A(n) 8 Fr single lumen catheter was advanced through the tunnel. The catheter was attached to the port and this was placed in the subcutaneous pocket. Under fluoroscopic guidance, a peel away sheath was placed, and the catheter was trimmed to the appropriate length and was advanced into the central veins. The catheter length is 22 cm. The tip of the catheter  lies near the superior cavoatrial junction. The port flushes and aspirates appropriately. The port was flushed and locked with heparinized saline. The port pocket was closed in 2 layers using 3-0 and 4-0 Vicryl/absorbable suture. Dermabond was also applied to both incisions. The patient tolerated the procedure well and was transferred to recovery in stable condition. IMPRESSION: Successful placement of a right-sided chest port via the right internal jugular vein. The port is ready for immediate use. Electronically Signed   By: Albin Felling M.D.   On: 06/01/2022 13:31     Assessment and plan- Patient is a 61 y.o. female with FIGO stage Ib endometrioid endometrial cancer pT1b N0 M0 here for on treatment assessment prior to cycle 1 of CarboTaxol chemotherapy  I have reviewed her second opinion pathology consult and discussed her case with GYN oncology from [place] Dr. Fransisca Connors as well.  I have discussed her case with Dr. Claiborne Billings who is her primary GYN oncologist as well.  Second opinion pathology consult confirms stage Ib endometrioid endometrial carcinoma.  There were isolated tumor cells noted in the lymph nodes without frank lymph node involvement.  There was also lymphovascular involvement noted not just in the uterus but also the ovaries and the fallopian tube.  Due to these risk factors we are proceeding with adjuvant chemotherapy despite stage Ib disease.  I will plan to give her 3 cycles of carboplatin AUC 5 along with Taxol at 175 mg per metered square IV every 3 weeks.  Repeat  scans after 3 cycles.  I will refer her to radiation oncology following that likely for vaginal brachytherapy over EBRT  Discussed risks and benefits of chemotherapy including all but not limited to possible risk of nausea vomiting low blood counts risk of infections and hospitalization.  Risk of peripheral neuropathy and infusion reaction associated with both carboplatin and Taxol.  Treatment is being given with a curative intent.  Patient understands and agrees to proceed as planned.  I will see her back in 3 weeks for cycle 2 of chemotherapy and she will be seen by NP Beckey Rutter in 10 days to see how she is tolerating her treatments.   Cancer Staging  Endometrial carcinoma ([place]) Staging form: Corpus Uteri - Carcinoma and Carcinosarcoma, AJCC 8th Edition - Pathologic stage from 05/22/2022: FIGO Stage IB (pT1b, pN0(i+), cM0) - Signed by Lloyd Huger, MD on 06/07/2022 Histologic grade (G): G2 Histologic grading system: 3 grade system Residual tumor (R): R0 - None     Visit Diagnosis 1. Endometrial carcinoma ([place])   2. Encounter for antineoplastic chemotherapy      Dr. Randa Evens, MD, MPH [facility] [phone] 06/14/2022 9:49 AM

## 2022-06-14 NOTE — Patient Instructions (Signed)
[facility] [place]  Discharge Instructions: Thank you for choosing [facility] to provide your oncology and hematology care.  If you have a lab appointment with the [facility], please go directly to the [facility] and check in at the registration area.  Wear comfortable clothing and clothing appropriate for easy access to any Portacath or PICC line.   We strive to give you quality time with your provider. You may need to reschedule your appointment if you arrive late (15 or more minutes).  Arriving late affects you and other patients whose appointments are after yours.  Also, if you miss three or more appointments without notifying the office, you may be dismissed from the clinic at the provider's discretion.      For prescription refill requests, have your pharmacy contact our office and allow 72 hours for refills to be completed.    Today you received the following chemotherapy and/or immunotherapy agents CARBOPLATIN and TAXOL      To help prevent nausea and vomiting after your treatment, we encourage you to take your nausea medication as directed.  BELOW ARE SYMPTOMS THAT SHOULD BE REPORTED IMMEDIATELY: *FEVER GREATER THAN 100.4 F (38 C) OR HIGHER *CHILLS OR SWEATING *NAUSEA AND VOMITING THAT IS NOT CONTROLLED WITH YOUR NAUSEA MEDICATION *UNUSUAL SHORTNESS OF BREATH *UNUSUAL BRUISING OR BLEEDING *URINARY PROBLEMS (pain or burning when urinating, or frequent urination) *BOWEL PROBLEMS (unusual diarrhea, constipation, pain near the anus) TENDERNESS IN MOUTH AND THROAT WITH OR WITHOUT PRESENCE OF ULCERS (sore throat, sores in mouth, or a toothache) UNUSUAL RASH, SWELLING OR PAIN  UNUSUAL VAGINAL DISCHARGE OR ITCHING   Items with * indicate a potential emergency and should be followed up as soon as possible or go to the Emergency Department if any problems should occur.  Please show the CHEMOTHERAPY ALERT CARD or IMMUNOTHERAPY ALERT CARD at  check-in to the Emergency Department and triage nurse.  Should you have questions after your visit or need to cancel or reschedule your appointment, please contact [facility] [place] [facility] [place]  [phone] and follow the prompts.  Office hours are 8:00 a.m. to 4:30 p.m. Monday - Friday. Please note that voicemails left after 4:00 p.m. may not be returned until the following business day.  We are closed weekends and major holidays. You have access to a nurse at all times for urgent questions. Please call the main number to the clinic [phone] and follow the prompts.  For any non-urgent questions, you may also contact your provider using MyChart. We now offer e-Visits for anyone 18 and older to request care online for non-urgent symptoms. For details visit [email or web address].   Also download the MyChart app! Go to the app store, search "MyChart", open the app, select [facility], and log in with your MyChart username and password.  Masks are optional in the cancer centers. If you would like for your care team to wear a mask while they are taking care of you, please let them know. For doctor visits, patients may have with them one support person who is at least 62 years old. At this time, visitors are not allowed in the infusion area.  Paclitaxel Injection What is this medication? PACLITAXEL (PAK li TAX el) treats some types of cancer. It works by slowing down the growth of cancer cells. This medicine may be used for other purposes; ask your health care provider or pharmacist if you have questions. COMMON BRAND NAME(S): Onxol, Taxol What should I tell  my care team before I take this medication? They need to know if you have any of these conditions: Heart disease Liver disease Low white blood cell levels An unusual or allergic reaction to paclitaxel, other medications, foods, dyes, or preservatives If you or your partner are pregnant or trying to get  pregnant Breast-feeding How should I use this medication? This medication is injected into a vein. It is given by your care team in a hospital or clinic setting. Talk to your care team about the use of this medication in children. While it may be given to children for selected conditions, precautions do apply. Overdosage: If you think you have taken too much of this medicine contact a poison control center or emergency room at once. NOTE: This medicine is only for you. Do not share this medicine with others. What if I miss a dose? Keep appointments for follow-up doses. It is important not to miss your dose. Call your care team if you are unable to keep an appointment. What may interact with this medication? Do not take this medication with any of the following: Live virus vaccines Other medications may affect the way this medication works. Talk with your care team about all of the medications you take. They may suggest changes to your treatment plan to lower the risk of side effects and to make sure your medications work as intended. This list may not describe all possible interactions. Give your health care provider a list of all the medicines, herbs, non-prescription drugs, or dietary supplements you use. Also tell them if you smoke, drink alcohol, or use illegal drugs. Some items may interact with your medicine. What should I watch for while using this medication? Your condition will be monitored carefully while you are receiving this medication. You may need blood work while taking this medication. This medication may make you feel generally unwell. This is not uncommon as chemotherapy can affect healthy cells as well as cancer cells. Report any side effects. Continue your course of treatment even though you feel ill unless your care team tells you to stop. This medication can cause serious allergic reactions. To reduce the risk, your care team may give you other medications to take before  receiving this one. Be sure to follow the directions from your care team. This medication may increase your risk of getting an infection. Call your care team for advice if you get a fever, chills, sore throat, or other symptoms of a cold or flu. Do not treat yourself. Try to avoid being around people who are sick. This medication may increase your risk to bruise or bleed. Call your care team if you notice any unusual bleeding. Be careful brushing or flossing your teeth or using a toothpick because you may get an infection or bleed more easily. If you have any dental work done, tell your dentist you are receiving this medication. Talk to your care team if you may be pregnant. Serious birth defects can occur if you take this medication during pregnancy. Talk to your care team before breastfeeding. Changes to your treatment plan may be needed. What side effects may I notice from receiving this medication? Side effects that you should report to your care team as soon as possible: Allergic reactions--skin rash, itching, hives, swelling of the face, lips, tongue, or throat Heart rhythm changes--fast or irregular heartbeat, dizziness, feeling faint or lightheaded, chest pain, trouble breathing Increase in blood pressure Infection--fever, chills, cough, sore throat, wounds that don't heal, pain or  trouble when passing urine, general feeling of discomfort or being unwell Low blood pressure--dizziness, feeling faint or lightheaded, blurry vision Low red blood cell level--unusual weakness or fatigue, dizziness, headache, trouble breathing Painful swelling, warmth, or redness of the skin, blisters or sores at the infusion site Pain, tingling, or numbness in the hands or feet Slow heartbeat--dizziness, feeling faint or lightheaded, confusion, trouble breathing, unusual weakness or fatigue Unusual bruising or bleeding Side effects that usually do not require medical attention (report to your care team if they  continue or are bothersome): Diarrhea Hair loss Joint pain Loss of appetite Muscle pain Nausea Vomiting This list may not describe all possible side effects. Call your doctor for medical advice about side effects. You may report side effects to FDA at 1-800-FDA-1088. Where should I keep my medication? This medication is given in a hospital or clinic. It will not be stored at home. NOTE: This sheet is a summary. It may not cover all possible information. If you have questions about this medicine, talk to your doctor, pharmacist, or health care provider.  2023 Elsevier/Gold Standard (2022-02-22 00:00:00)  Carboplatin Injection What is this medication? CARBOPLATIN (KAR boe pla tin) treats some types of cancer. It works by slowing down the growth of cancer cells. This medicine may be used for other purposes; ask your health care provider or pharmacist if you have questions. COMMON BRAND NAME(S): Paraplatin What should I tell my care team before I take this medication? They need to know if you have any of these conditions: Blood disorders Hearing problems Kidney disease Recent or ongoing radiation therapy An unusual or allergic reaction to carboplatin, cisplatin, other medications, foods, dyes, or preservatives Pregnant or trying to get pregnant Breast-feeding How should I use this medication? This medication is injected into a vein. It is given by your care team in a hospital or clinic setting. Talk to your care team about the use of this medication in children. Special care may be needed. Overdosage: If you think you have taken too much of this medicine contact a poison control center or emergency room at once. NOTE: This medicine is only for you. Do not share this medicine with others. What if I miss a dose? Keep appointments for follow-up doses. It is important not to miss your dose. Call your care team if you are unable to keep an appointment. What may interact with this  medication? Medications for seizures Some antibiotics, such as amikacin, gentamicin, neomycin, streptomycin, tobramycin Vaccines This list may not describe all possible interactions. Give your health care provider a list of all the medicines, herbs, non-prescription drugs, or dietary supplements you use. Also tell them if you smoke, drink alcohol, or use illegal drugs. Some items may interact with your medicine. What should I watch for while using this medication? Your condition will be monitored carefully while you are receiving this medication. You may need blood work while taking this medication. This medication may make you feel generally unwell. This is not uncommon, as chemotherapy can affect healthy cells as well as cancer cells. Report any side effects. Continue your course of treatment even though you feel ill unless your care team tells you to stop. In some cases, you may be given additional medications to help with side effects. Follow all directions for their use. This medication may increase your risk of getting an infection. Call your care team for advice if you get a fever, chills, sore throat, or other symptoms of a cold or flu. Do  not treat yourself. Try to avoid being around people who are sick. Avoid taking medications that contain aspirin, acetaminophen, ibuprofen, naproxen, or ketoprofen unless instructed by your care team. These medications may hide a fever. Be careful brushing or flossing your teeth or using a toothpick because you may get an infection or bleed more easily. If you have any dental work done, tell your dentist you are receiving this medication. Talk to your care team if you wish to become pregnant or think you might be pregnant. This medication can cause serious birth defects. Talk to your care team about effective forms of contraception. Do not breast-feed while taking this medication. What side effects may I notice from receiving this medication? Side effects  that you should report to your care team as soon as possible: Allergic reactions--skin rash, itching, hives, swelling of the face, lips, tongue, or throat Infection--fever, chills, cough, sore throat, wounds that don't heal, pain or trouble when passing urine, general feeling of discomfort or being unwell Low red blood cell level--unusual weakness or fatigue, dizziness, headache, trouble breathing Pain, tingling, or numbness in the hands or feet, muscle weakness, change in vision, confusion or trouble speaking, loss of balance or coordination, trouble walking, seizures Unusual bruising or bleeding Side effects that usually do not require medical attention (report to your care team if they continue or are bothersome): Hair loss Nausea Unusual weakness or fatigue Vomiting This list may not describe all possible side effects. Call your doctor for medical advice about side effects. You may report side effects to FDA at 1-800-FDA-1088. Where should I keep my medication? This medication is given in a hospital or clinic. It will not be stored at home. NOTE: This sheet is a summary. It may not cover all possible information. If you have questions about this medicine, talk to your doctor, pharmacist, or health care provider.  2023 Elsevier/Gold Standard (2022-01-30 00:00:00)

## 2022-06-14 NOTE — Progress Notes (Signed)
Nutrition Assessment   Reason for Assessment:   Patient referred from [name] clinic, new chemo   ASSESSMENT:   61 year old female with stage Ib endometrial cancer.  Past medical history of colon cancer, HLD, HTN.  Patient receiving carboplatin and taxol q 3 weeks.   Met with patient and husband during infusion.  Says that for about 8 weeks after surgery did not have much of an appetite.  No taste for food.  Over the last 2 weeks appetite has improved back to baseline.  Typically eats around 11 am yogurt and fruit.  Bigger meal is usually around  2-4pm and may include rice, fish, chicken, vegetables or soup.  Later in the afternoon evening snacks on fruit, chips and avocado, nuts.  Likes kefir.     Medications: compazine, zofran, fe sulfate, MVI   Labs: reviewed   Anthropometrics:   Height: 62.5 inches Weight: 144 lb 12.8 oz on 8/23 144 lb on 05/22/22 151 lb on 03/22/21 BMI: 26  4% weight loss in the last year and 2 months, not significant   Estimated Energy Needs  Kcals: 1625-1950 calories Protein: 81-97 g Fluid: 1625-1950 ml   NUTRITION DIAGNOSIS: Inadequate oral intake related to cancer related treatment (surgery) as evidenced by 4% weight loss in the last year and decreased intake after surgery but improved   INTERVENTION:  Discussed importance of nutrition during treatment Encouraged good source of protein at every meal/snack Encouraged incorporating good plant foods as well Contact information provided   MONITORING, EVALUATION, GOAL: weight trends, intake   Next Visit: Wednesday, Sept 13 during infusion  Jaime Johnson B. Jaime Johnson, [place], Jaime Johnson Registered Dietitian [phone]

## 2022-06-15 ENCOUNTER — Telehealth: Payer: Self-pay

## 2022-06-15 ENCOUNTER — Encounter: Payer: Self-pay | Admitting: Nurse Practitioner

## 2022-06-15 NOTE — Telephone Encounter (Signed)
Telephone call to patient for follow up after receiving first infusion.   Patient states infusion went great.  States eating good and drinking plenty of fluids.   Denies any nausea or vomiting.  Encouraged patient to call for any concerns or questions. 

## 2022-06-20 ENCOUNTER — Encounter: Payer: Self-pay | Admitting: Licensed Clinical Social Worker

## 2022-06-20 NOTE — Progress Notes (Signed)
[facility] Clinical Social Work  Clinical Social Work was referred by medical provider for assessment of psychosocial needs.  Clinical Social Worker attempted to contact patient by phone  to offer support and assess for needs.  CSW left voicemail with contact information and request for a return call.    SA   Michela Herst, LCSW  Clinical Social Worker [facility]         

## 2022-06-21 ENCOUNTER — Inpatient Hospital Stay (HOSPITAL_BASED_OUTPATIENT_CLINIC_OR_DEPARTMENT_OTHER): Payer: Self-pay | Admitting: Hospice and Palliative Medicine

## 2022-06-21 ENCOUNTER — Inpatient Hospital Stay: Payer: Self-pay

## 2022-06-21 ENCOUNTER — Encounter: Payer: Self-pay | Admitting: Hospice and Palliative Medicine

## 2022-06-21 ENCOUNTER — Other Ambulatory Visit: Payer: Self-pay

## 2022-06-21 ENCOUNTER — Telehealth: Payer: Self-pay | Admitting: Family Medicine

## 2022-06-21 VITALS — BP 193/85 | HR 68 | Temp 96.9°F | Resp 20 | Ht 62.5 in | Wt 145.6 lb

## 2022-06-21 DIAGNOSIS — C541 Malignant neoplasm of endometrium: Secondary | ICD-10-CM

## 2022-06-21 LAB — CBC WITH DIFFERENTIAL/PLATELET
Abs Immature Granulocytes: 0.01 10*3/uL (ref 0.00–0.07)
Basophils Absolute: 0 10*3/uL (ref 0.0–0.1)
Basophils Relative: 1 %
Eosinophils Absolute: 0 10*3/uL (ref 0.0–0.5)
Eosinophils Relative: 2 %
HCT: 38.2 % (ref 36.0–46.0)
Hemoglobin: 12.4 g/dL (ref 12.0–15.0)
Immature Granulocytes: 0 %
Lymphocytes Relative: 45 %
Lymphs Abs: 1.1 10*3/uL (ref 0.7–4.0)
MCH: 25.7 pg — ABNORMAL LOW (ref 26.0–34.0)
MCHC: 32.5 g/dL (ref 30.0–36.0)
MCV: 79.1 fL — ABNORMAL LOW (ref 80.0–100.0)
Monocytes Absolute: 0.1 10*3/uL (ref 0.1–1.0)
Monocytes Relative: 4 %
Neutro Abs: 1.1 10*3/uL — ABNORMAL LOW (ref 1.7–7.7)
Neutrophils Relative %: 48 %
Platelets: 233 10*3/uL (ref 150–400)
RBC: 4.83 MIL/uL (ref 3.87–5.11)
RDW: 13.8 % (ref 11.5–15.5)
WBC: 2.4 10*3/uL — ABNORMAL LOW (ref 4.0–10.5)
nRBC: 0 % (ref 0.0–0.2)

## 2022-06-21 LAB — BASIC METABOLIC PANEL
Anion gap: 8 (ref 5–15)
BUN: 14 mg/dL (ref 8–23)
CO2: 29 mmol/L (ref 22–32)
Calcium: 9.2 mg/dL (ref 8.9–10.3)
Chloride: 102 mmol/L (ref 98–111)
Creatinine, Ser: 0.77 mg/dL (ref 0.44–1.00)
GFR, Estimated: >60 mL/min (ref >60)
Glucose, Bld: 122 mg/dL — ABNORMAL HIGH (ref 70–99)
Potassium: 3.7 mmol/L (ref 3.5–5.1)
Sodium: 139 mmol/L (ref 135–145)

## 2022-06-21 MED ORDER — SODIUM CHLORIDE 0.9% FLUSH
10.0000 mL | Freq: Once | INTRAVENOUS | Status: AC
  Administered 2022-06-21: 10 mL via INTRAVENOUS
  Filled 2022-06-21: qty 10

## 2022-06-21 MED ORDER — HEPARIN SOD (PORK) LOCK FLUSH 100 UNIT/ML IV SOLN
500.0000 [IU] | Freq: Once | INTRAVENOUS | Status: AC
  Administered 2022-06-21: 500 [IU] via INTRAVENOUS
  Filled 2022-06-21: qty 5

## 2022-06-21 NOTE — Telephone Encounter (Signed)
Spoke with Jerl Santos and scheduled appointment with Dr. Lorelei Pont on 06/27/22 at 10:20 am.

## 2022-06-21 NOTE — Telephone Encounter (Signed)
Can you call her?  Oncology was concerned about her blood pressure, and they wanted her to come in to the office at her convenience to try to find something that she can tolerate.

## 2022-06-21 NOTE — Progress Notes (Signed)
Symptom Management [place] at [facility] Telephone:([phone] Fax:(336) [phone]  Patient Care Team: Owens Loffler, MD as PCP - General (Family Medicine) Rico Junker, RN as Registered Nurse Theodore Demark, RN (Inactive) as Registered Nurse Clent Jacks, RN as Oncology Nurse Navigator   NAME OF PATIENT: Jaime Johnson  [id number]  [birth year]   DATE OF VISIT: 06/21/22  REASON FOR CONSULT: Jaime Johnson is a 60 y.o. female with multiple medical problems including stage Ib carcinoma of the endometrium.  Patient underwent robotic surgery on 03/30/2022.  This was followed by adjuvant chemotherapy with [place] Taxol for 3 cycles followed by vaginal brachytherapy.  INTERVAL HISTORY: Patient was seen by Dr. Janese Banks on 06/13/2022 and received cycle 1 carbo Taxol chemotherapy.  Patient presents to [facility] today for postchemotherapy follow-up and to discuss any needed supportive care measures.  Patient reports that she had some weakness and fatigue for a day or 2 following chemotherapy but is since been improving.  She had some bony aches that has also improved.  She denies any severe symptomatic complaints at present.  She feels like she is eating and drinking well.  Denies any neurologic complaints. Denies recent fevers or illnesses. Denies any easy bleeding or bruising. Reports good appetite and denies weight loss. Denies chest pain. Denies any nausea, vomiting, constipation, or diarrhea. Denies urinary complaints. Patient offers no further specific complaints today.   PAST MEDICAL HISTORY: Past Medical History:  Diagnosis Date   Allergic rhinitis due to pollen    Colon cancer ([place])    Hyperlipidemia LDL goal <100 02/16/2020   Hypertension 12/19/2016    PAST SURGICAL HISTORY:  Past Surgical History:  Procedure Laterality Date   CESAREAN SECTION     COLONOSCOPY WITH PROPOFOL N/A 11/12/2016   Procedure: COLONOSCOPY WITH PROPOFOL;  Surgeon: Lucilla Lame, MD;  Location: [place];  Service: Endoscopy;  Laterality: N/A;   COLONOSCOPY WITH PROPOFOL N/A 03/29/2017   Procedure: COLONOSCOPY WITH PROPOFOL;  Surgeon: Lucilla Lame, MD;  Location: [place];  Service: Endoscopy;  Laterality: N/A;   COLONOSCOPY WITH PROPOFOL N/A 03/14/2020   Procedure: COLONOSCOPY WITH PROPOFOL;  Surgeon: Lucilla Lame, MD;  Location: [place];  Service: Endoscopy;  Laterality: N/A;  priority 4   IR IMAGING GUIDED PORT INSERTION  06/01/2022   POLYPECTOMY  11/12/2016   Procedure: POLYPECTOMY;  Surgeon: Lucilla Lame, MD;  Location: [place];  Service: Endoscopy;;   POLYPECTOMY  03/29/2017   Procedure: POLYPECTOMY;  Surgeon: Lucilla Lame, MD;  Location: [place];  Service: Endoscopy;;    HEMATOLOGY/ONCOLOGY HISTORY:  Oncology History  Endometrial carcinoma ([place])  03/09/2022 Initial Diagnosis   Endometrial carcinoma ([place])   05/22/2022 Cancer Staging   Staging form: Corpus Uteri - Carcinoma and Carcinosarcoma, AJCC 8th Edition - Pathologic stage from 05/22/2022: FIGO Stage IB (pT1b, pN0(i+), cM0) - Signed by Lloyd Huger, MD on 06/07/2022 Histologic grade (G): G2 Histologic grading system: 3 grade system Residual tumor (R): R0 - None   06/14/2022 -  Chemotherapy   Patient is on Treatment Plan : UTERINE Carboplatin AUC 6 + Paclitaxel q21d       ALLERGIES:  is allergic to azithromycin.  MEDICATIONS:  Current Outpatient Medications  Medication Sig Dispense Refill   albuterol (VENTOLIN HFA) 108 (90 Base) MCG/ACT inhaler Inhale 2 puffs into the lungs every 6 (six) hours as needed.     ferrous sulfate 325 (65 FE) MG tablet Take by mouth.  ibuprofen (ADVIL) 600 MG tablet Take 600 mg by mouth every 6 (six) hours as needed. (Patient not taking: Reported on 06/13/2022)     lidocaine-prilocaine (EMLA) cream Apply to affected area once 30 g 3   lisinopril-hydrochlorothiazide (ZESTORETIC) 10-12.5 MG tablet Take 1 tablet  by mouth daily. 90 tablet 3   Multiple Vitamin (QUINTABS) TABS Take 1 tablet by mouth daily.     Multiple Vitamins-Minerals (MULTIVITAMIN GUMMIES ADULTS PO) Take by mouth daily.     ondansetron (ZOFRAN) 8 MG tablet Take 1 tablet (8 mg total) by mouth every 8 (eight) hours as needed for nausea or vomiting. (Patient not taking: Reported on 06/13/2022) 60 tablet 2   ondansetron (ZOFRAN-ODT) 8 MG disintegrating tablet Take 8 mg by mouth every 8 (eight) hours as needed. (Patient not taking: Reported on 06/13/2022)     prochlorperazine (COMPAZINE) 10 MG tablet Take 1 tablet (10 mg total) by mouth every 6 (six) hours as needed for nausea or vomiting. (Patient not taking: Reported on 06/13/2022) 60 tablet 2   rosuvastatin (CRESTOR) 10 MG tablet Take 1 tablet (10 mg total) by mouth daily. (Patient not taking: Reported on 06/13/2022) 90 tablet 3   SYSTANE COMPLETE 0.6 % SOLN      traMADol (ULTRAM) 50 MG tablet TAKE 1 TABLET(50 MG) BY MOUTH DAILY 30 tablet 3   No current facility-administered medications for this visit.   Facility-Administered Medications Ordered in Other Visits  Medication Dose Route Frequency Provider Last Rate Last Admin   heparin lock flush 100 unit/mL  500 Units Intravenous Once Walida Cajas, Kirt Boys, NP        VITAL SIGNS: LMP 02/29/2020 (Approximate) Comment: Has had random spotting; hadn't had a period 1.5 yrs prior There were no vitals filed for this visit.  Estimated body mass index is 26.06 kg/m as calculated from the following:   Height as of 06/01/22: 5' 2.5" (1.588 m).   Weight as of 06/13/22: 144 lb 12.8 oz (65.7 kg).  LABS: CBC:    Component Value Date/Time   WBC 4.4 06/13/2022 1241   HGB 13.3 06/13/2022 1241   HCT 41.2 06/13/2022 1241   PLT 247 06/13/2022 1241   MCV 79.4 (L) 06/13/2022 1241   NEUTROABS 2.3 06/13/2022 1241   LYMPHSABS 1.7 06/13/2022 1241   MONOABS 0.3 06/13/2022 1241   EOSABS 0.1 06/13/2022 1241   BASOSABS 0.0 06/13/2022 1241   Comprehensive  Metabolic Panel:    Component Value Date/Time   NA 140 06/13/2022 1241   K 3.8 06/13/2022 1241   CL 104 06/13/2022 1241   CO2 27 06/13/2022 1241   BUN 13 06/13/2022 1241   CREATININE 0.77 06/13/2022 1241   CREATININE 0.86 03/14/2021 0949   GLUCOSE 102 (H) 06/13/2022 1241   CALCIUM 9.5 06/13/2022 1241   AST 25 06/13/2022 1241   ALT 21 06/13/2022 1241   ALKPHOS 52 06/13/2022 1241   BILITOT 0.5 06/13/2022 1241   PROT 7.7 06/13/2022 1241   ALBUMIN 4.3 06/13/2022 1241    RADIOGRAPHIC STUDIES: NM PET Image Restag (PS) Skull Base To Thigh  Result Date: 06/05/2022 CLINICAL DATA:  Subsequent treatment strategy for endometrial carcinoma status post hysterectomy March 30, 2022. EXAM: NUCLEAR MEDICINE PET SKULL BASE TO THIGH TECHNIQUE: 8.1 mCi F-18 FDG was injected intravenously. Full-ring PET imaging was performed from the skull base to thigh after the radiotracer. CT data was obtained and used for attenuation correction and anatomic localization. Fasting blood glucose: 100 mg/dl COMPARISON:  None Available. FINDINGS: Mediastinal blood pool  activity: SUV max 1.73 Liver activity: SUV max NA NECK: No hypermetabolic cervical adenopathy. Homogeneous hypermetabolic activity throughout the thyroid gland with a max SUV of 3.3, commonly reflects thyroiditis. Hypodense 14 mm nodule in the right lobe of the thyroid which demonstrates some peripheral calcifications and activity slightly greater than that of background thyroid with a max SUV of 4.9. Incidental CT findings: None. CHEST: No hypermetabolic thoracic adenopathy. No hypermetabolic pulmonary nodules or masses. Incidental CT findings: Right chest Port-A-Cath with tip near the superior cavoatrial junction. No suspicious pulmonary nodules on this motion degraded examination. ABDOMEN/PELVIS: Prior hysterectomy without suspicious hypermetabolic soft tissue nodularity along the vaginal cuff. No abnormal hypermetabolic activity within the liver, pancreas, adrenal  glands, or spleen. No hypermetabolic lymph nodes in the abdomen or pelvis. Incidental CT findings: Mild mesenteric and omental stranding without FDG avid nodularity. SKELETON: No focal hypermetabolic activity to suggest skeletal metastasis. Incidental CT findings: None. IMPRESSION: 1. Status post hysterectomy without suspicious hypermetabolic soft tissue nodularity along the vaginal cuff to suggest residual disease. 2. No convincing evidence of hypermetabolic metastatic disease. 3. Mild mesenteric and omental stranding without abnormal FDG avid nodularity possibly reflecting postsurgical change, suggest attention on follow-up imaging. 4. Incidental 1.4 cm right thyroid nodule with slightly increased uptake. Recommend nonemergent thyroid US and biopsy of nodule if detected. Reference: J Am Coll Radiol. 2015 Feb;12(2): 143-50 5. Slightly increased background thyroid activity is nonspecific but may reflect thyroiditis recommend correlation with laboratory values. 6.  Aortic Atherosclerosis (ICD10-I70.0). Electronically Signed   By: Dahlia Bailiff M.D.   On: 06/05/2022 12:56   IR IMAGING GUIDED PORT INSERTION  Result Date: 06/01/2022 INDICATION: Chemotherapy infusion EXAM: Port placement using ultrasound and fluoroscopic guidance MEDICATIONS: Per EMR ANESTHESIA/SEDATION: Moderate (conscious) sedation was employed during this procedure. A total of Versed 2 mg and Fentanyl 100 mcg was administered intravenously. Moderate Sedation Time: 34 minutes. The patient's level of consciousness and vital signs were monitored continuously by radiology nursing throughout the procedure under my direct supervision. FLUOROSCOPY TIME:  Fluoroscopy Time: 0.3 minutes (1.3 mGy) COMPLICATIONS: None immediate. PROCEDURE: Informed written consent was obtained from the patient after a thorough discussion of the procedural risks, benefits and alternatives. All questions were addressed. Maximal Sterile Barrier Technique was utilized including  caps, mask, sterile gowns, sterile gloves, sterile drape, hand hygiene and skin antiseptic. A timeout was performed prior to the initiation of the procedure. The patient was placed supine on the exam table. The right neck and chest was prepped and draped in the standard sterile fashion. A preliminary ultrasound of the right neck was performed and demonstrates a patent right internal jugular vein. A permanent ultrasound image was stored in the electronic medical record. The overlying skin was anesthetized with 1% Lidocaine. Using ultrasound guidance, access was obtained into the right internal jugular vein using a 21 gauge micropuncture set. A wire was advanced into the SVC, a short incision was made at the puncture site, and serial dilatation performed. Next, in an ipsilateral infraclavicular location, an incision was made at the site of the subcutaneous reservoir. Blunt dissection was used to open a pocket to contain the reservoir. A subcutaneous tunnel was then created from the port site to the puncture site. A(n) 8 Fr single lumen catheter was advanced through the tunnel. The catheter was attached to the port and this was placed in the subcutaneous pocket. Under fluoroscopic guidance, a peel away sheath was placed, and the catheter was trimmed to the appropriate length and was advanced into the  central veins. The catheter length is 22 cm. The tip of the catheter lies near the superior cavoatrial junction. The port flushes and aspirates appropriately. The port was flushed and locked with heparinized saline. The port pocket was closed in 2 layers using 3-0 and 4-0 Vicryl/absorbable suture. Dermabond was also applied to both incisions. The patient tolerated the procedure well and was transferred to recovery in stable condition. IMPRESSION: Successful placement of a right-sided chest port via the right internal jugular vein. The port is ready for immediate use. Electronically Signed   By: Albin Felling M.D.   On:  06/01/2022 13:31    PERFORMANCE STATUS (ECOG) : 0 - Asymptomatic  Review of Systems Unless otherwise noted, a complete review of systems is negative.  Physical Exam General: NAD Cardiovascular: regular rate and rhythm Pulmonary: clear anterior/posterior fields Abdomen: soft, nontender, + bowel sounds GU: no suprapubic tenderness Extremities: no edema, no joint deformities Skin: no rashes Neurological: Gross nonfocal  IMPRESSION/PLAN: Patient seems to be doing well postchemotherapy without significant symptomatic burden.  Labs reviewed with patient.  I no evidence of clinical dehydration and t does not appear that she requires IV fluids today.  She has had some minor aches but is taken tramadol with good effect.    She was hypertensive initially upon presentation to clinic today but states that she is not taking her antihypertensive as she did not like the way it made her feel.  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  It did improve slightly while in clinic today.  She is asymptomatic.  Message sent to PCP requesting follow-up and assistance with medication adjustment.    Discussed supportive care services available in the cancer center.  Patient will RTC as previously scheduled in 2 weeks for consideration of cycle 2.   Patient expressed understanding and was in agreement with this plan. She also understands that She can call clinic at any time with any questions, concerns, or complaints.   Thank you for allowing me to participate in the care of this very pleasant patient.   Time Total: 20 minutes  Visit consisted of counseling and education dealing with the complex and emotionally intense issues of symptom management in the setting of serious illness.Greater than 50%  of this time was spent counseling and coordinating care related to the above assessment and plan.  Signed by: Altha Harm, PhD, NP-C

## 2022-06-22 ENCOUNTER — Inpatient Hospital Stay: Payer: Self-pay | Attending: Oncology | Admitting: Licensed Clinical Social Worker

## 2022-06-22 DIAGNOSIS — C541 Malignant neoplasm of endometrium: Secondary | ICD-10-CM | POA: Insufficient documentation

## 2022-06-22 DIAGNOSIS — Z5189 Encounter for other specified aftercare: Secondary | ICD-10-CM | POA: Insufficient documentation

## 2022-06-22 DIAGNOSIS — Z5111 Encounter for antineoplastic chemotherapy: Secondary | ICD-10-CM | POA: Insufficient documentation

## 2022-06-22 DIAGNOSIS — Z79899 Other long term (current) drug therapy: Secondary | ICD-10-CM | POA: Insufficient documentation

## 2022-06-22 NOTE — Progress Notes (Signed)
[place] [facility]  Initial Assessment   Jaime Johnson is a 61 y.o. year old female met with caregiver. Clinical Social Work was referred by medical provider for assessment of psychosocial needs.   SDOH (Social Determinants of Health) assessments performed: Yes SDOH Interventions    Flowsheet Row Most Recent Value  SDOH Interventions   Food Insecurity Interventions Intervention Not Indicated  Financial Strain Interventions Development worker, community, Other (Comment)  [Referral sent to Hotel manager and [place] Interventions Intervention Not Indicated  Physical Activity Interventions Intervention Not Indicated  Stress Interventions Provide Counseling  Social Connections Interventions Intervention Not Indicated  Transportation Interventions Patient Resources (Friends/Family)  Depression Interventions/Treatment  Counseling       SDOH Screenings   Alcohol Screen: Low Risk  (06/22/2022)   Alcohol Screen    Last Alcohol Screening Score (AUDIT): 0  Depression (PHQ2-9): Low Risk  (06/22/2022)   Depression (PHQ2-9)    PHQ-2 Score: 3  Financial Resource Strain: Low Risk  (06/22/2022)   Overall Financial Resource Strain (CARDIA)    Difficulty of Paying Living Expenses: Not very hard  Food Insecurity: No Food Insecurity (06/22/2022)   Hunger Vital Sign    Worried About Running Out of Food in the Last Year: Never true    Ran Out of Food in the Last Year: Never true  Housing: Low Risk  (06/22/2022)   Housing    Last Housing Risk Score: 0  Physical Activity: Inactive (06/22/2022)   Exercise Vital Sign    Days of Exercise per Week: 0 days    Minutes of Exercise per Session: 0 min  Social Connections: Socially Integrated (06/22/2022)   Social Connection and Isolation Panel [NHANES]    Frequency of Communication with Friends and Family: Three times a week    Frequency of Social Gatherings with Friends and Family: Three times a week    Attends Religious Services: 1 to 4 times per  year    Active Member of Clubs or Organizations: No    Attends Archivist Meetings: 1 to 4 times per year    Marital Status: Married  Stress: Stress Concern Present (06/22/2022)   [place]    Feeling of Stress : To some extent  Tobacco Use: Medium Risk (06/21/2022)   Patient History    Smoking Tobacco Use: Former    Smokeless Tobacco Use: Never    Passive Exposure: Not on file  Transportation Needs: No Transportation Needs (06/22/2022)   PRAPARE - Transportation    Lack of Transportation (Medical): No    Lack of Transportation (Non-Medical): No     Distress Screen completed: No     No data to display            Family/Social Information:  Housing Arrangement: patient lives with spouse,  Jaime Johnson, Jaime Johnson  [phone]  Family members/support persons in your life? Family, Friends, and Geophysical data processor concerns: no  Employment: Disabled , currently receiving disability.  Income source: Banker concerns: Yes, current concerns Type of concern: Medical bills and cost of medical procedures Food access concerns: no Religious or spiritual practice: Not known Services Currently in place:  Disability  Coping/ Adjustment to diagnosis: Patient understands treatment plan and what happens next? yes Concerns about diagnosis and/or treatment: How I will pay for the services I need Patient reported stressors: Finances, Anxiety/ nervousness, and Adjusting to my illness Hopes and/or priorities: N/A Patient enjoys  N/A Current coping skills/  strengths: Ability for insight , Active sense of humor , Average or above average intelligence , Capable of independent living , Communication skills , Financial means , Motivation for treatment/growth , Physical Health , and Supportive family/friends     SUMMARY: Current SDOH Barriers:  Financial constraints related to fixed  income  Clinical Social Work Clinical Goal(s):  CSW sent referral to Fund Manager and Financial Navigator  Interventions: Discussed common feeling and emotions when being diagnosed with cancer, and the importance of support during treatment Informed patient of the support team roles and support services at [facility] Provided CSW contact information and encouraged patient to call with any questions or concerns Referred patient to Fund Manager and Financial Navigator, CSW role in patient care and available resources.    Follow Up Plan: Patient will contact CSW with any support or resource needs Patient verbalizes understanding of plan: Yes    Teresita Maxey, LCSW     

## 2022-06-26 NOTE — Progress Notes (Unsigned)
    Jaime Johnson T. Jaime Rubis, MD, [place] at [facility] [place], [address]  Phone: [phone]  FAX: [place] - 61 y.o. female  MRN [id number]  Date of Birth: 12-05-1960  Date: 06/27/2022  PCP: Jaime Loffler, MD  Referral: Jaime Loffler, MD  No chief complaint on file.  Subjective:   Jaime Johnson is a 61 y.o. very pleasant female patient with There is no height or weight on file to calculate BMI. who presents with the following:  I was recently contacted by the Oncology service about her BP being very high and difficult to control.  She is under active treatment for Endometrial Carcinoma.     Review of Systems is noted in the HPI, as appropriate  Objective:   LMP 02/29/2020 (Approximate) Comment: Has had random spotting; hadn't had a period 1.5 yrs prior  GEN: No acute distress; alert,appropriate. PULM: Breathing comfortably in no respiratory distress PSYCH: Normally interactive.   Laboratory and Imaging Data:  Assessment and Plan:   ***

## 2022-06-27 ENCOUNTER — Ambulatory Visit (INDEPENDENT_AMBULATORY_CARE_PROVIDER_SITE_OTHER): Payer: Disability Insurance | Admitting: Family Medicine

## 2022-06-27 ENCOUNTER — Encounter: Payer: Self-pay | Admitting: Family Medicine

## 2022-06-27 VITALS — BP 138/100 | HR 73 | Temp 97.8°F | Ht 62.5 in | Wt 144.2 lb

## 2022-06-27 DIAGNOSIS — I10 Essential (primary) hypertension: Secondary | ICD-10-CM

## 2022-06-27 DIAGNOSIS — C541 Malignant neoplasm of endometrium: Secondary | ICD-10-CM

## 2022-06-27 MED ORDER — LISINOPRIL 40 MG PO TABS: 40.0000 mg | ORAL_TABLET | Freq: Every day | ORAL | 3 refills | Status: DC

## 2022-06-27 NOTE — Patient Instructions (Signed)
Send me a MyChart message in a couple of weeks to let me know how your blood pressure is going.  I want the average readings.

## 2022-07-03 MED FILL — Fosaprepitant Dimeglumine For IV Infusion 150 MG (Base Eq): INTRAVENOUS | Qty: 5 | Status: AC

## 2022-07-03 MED FILL — Dexamethasone Sodium Phosphate Inj 100 MG/10ML: INTRAMUSCULAR | Qty: 1 | Status: AC

## 2022-07-04 ENCOUNTER — Inpatient Hospital Stay: Payer: Self-pay

## 2022-07-04 ENCOUNTER — Encounter: Payer: Self-pay | Admitting: Oncology

## 2022-07-04 ENCOUNTER — Inpatient Hospital Stay (HOSPITAL_BASED_OUTPATIENT_CLINIC_OR_DEPARTMENT_OTHER): Payer: Self-pay | Admitting: Oncology

## 2022-07-04 VITALS — BP 190/82 | HR 74 | Temp 97.9°F | Resp 16 | Ht 62.5 in | Wt 145.2 lb

## 2022-07-04 VITALS — BP 179/83

## 2022-07-04 DIAGNOSIS — T451X5A Adverse effect of antineoplastic and immunosuppressive drugs, initial encounter: Secondary | ICD-10-CM

## 2022-07-04 DIAGNOSIS — C541 Malignant neoplasm of endometrium: Secondary | ICD-10-CM

## 2022-07-04 DIAGNOSIS — D701 Agranulocytosis secondary to cancer chemotherapy: Secondary | ICD-10-CM

## 2022-07-04 DIAGNOSIS — Z5111 Encounter for antineoplastic chemotherapy: Secondary | ICD-10-CM

## 2022-07-04 LAB — COMPREHENSIVE METABOLIC PANEL WITH GFR
ALT: 26 U/L (ref 0–44)
AST: 27 U/L (ref 15–41)
Albumin: 4.3 g/dL (ref 3.5–5.0)
Alkaline Phosphatase: 55 U/L (ref 38–126)
Anion gap: 5 (ref 5–15)
BUN: 13 mg/dL (ref 8–23)
CO2: 27 mmol/L (ref 22–32)
Calcium: 9.1 mg/dL (ref 8.9–10.3)
Chloride: 107 mmol/L (ref 98–111)
Creatinine, Ser: 0.7 mg/dL (ref 0.44–1.00)
GFR, Estimated: >60 mL/min
Glucose, Bld: 122 mg/dL — ABNORMAL HIGH (ref 70–99)
Potassium: 3.4 mmol/L — ABNORMAL LOW (ref 3.5–5.1)
Sodium: 139 mmol/L (ref 135–145)
Total Bilirubin: 0.8 mg/dL (ref 0.3–1.2)
Total Protein: 7.5 g/dL (ref 6.5–8.1)

## 2022-07-04 LAB — CBC WITH DIFFERENTIAL/PLATELET
Abs Immature Granulocytes: 0.02 K/uL (ref 0.00–0.07)
Basophils Absolute: 0.1 K/uL (ref 0.0–0.1)
Basophils Relative: 2 %
Eosinophils Absolute: 0 K/uL (ref 0.0–0.5)
Eosinophils Relative: 1 %
HCT: 41.5 % (ref 36.0–46.0)
Hemoglobin: 13.7 g/dL (ref 12.0–15.0)
Immature Granulocytes: 1 %
Lymphocytes Relative: 39 %
Lymphs Abs: 1.5 K/uL (ref 0.7–4.0)
MCH: 26.1 pg (ref 26.0–34.0)
MCHC: 33 g/dL (ref 30.0–36.0)
MCV: 79 fL — ABNORMAL LOW (ref 80.0–100.0)
Monocytes Absolute: 0.4 K/uL (ref 0.1–1.0)
Monocytes Relative: 9 %
Neutro Abs: 1.8 K/uL (ref 1.7–7.7)
Neutrophils Relative %: 48 %
Platelets: 200 K/uL (ref 150–400)
RBC: 5.25 MIL/uL — ABNORMAL HIGH (ref 3.87–5.11)
RDW: 14.2 % (ref 11.5–15.5)
WBC: 3.8 K/uL — ABNORMAL LOW (ref 4.0–10.5)
nRBC: 0 % (ref 0.0–0.2)

## 2022-07-04 MED ORDER — SODIUM CHLORIDE 0.9 % IV SOLN
150.0000 mg | Freq: Once | INTRAVENOUS | Status: AC
  Administered 2022-07-04: 150 mg via INTRAVENOUS
  Filled 2022-07-04: qty 150

## 2022-07-04 MED ORDER — PALONOSETRON HCL INJECTION 0.25 MG/5ML
0.2500 mg | Freq: Once | INTRAVENOUS | Status: AC
  Administered 2022-07-04: 0.25 mg via INTRAVENOUS
  Filled 2022-07-04: qty 5

## 2022-07-04 MED ORDER — FAMOTIDINE IN NACL 20-0.9 MG/50ML-% IV SOLN
20.0000 mg | Freq: Once | INTRAVENOUS | Status: AC
  Administered 2022-07-04: 20 mg via INTRAVENOUS
  Filled 2022-07-04: qty 50

## 2022-07-04 MED ORDER — SODIUM CHLORIDE 0.9 % IV SOLN
10.0000 mg | Freq: Once | INTRAVENOUS | Status: AC
  Administered 2022-07-04: 10 mg via INTRAVENOUS
  Filled 2022-07-04: qty 10

## 2022-07-04 MED ORDER — SODIUM CHLORIDE 0.9 % IV SOLN
Freq: Once | INTRAVENOUS | Status: AC
  Filled 2022-07-04: qty 250

## 2022-07-04 MED ORDER — SODIUM CHLORIDE 0.9 % IV SOLN
175.0000 mg/m2 | Freq: Once | INTRAVENOUS | Status: AC
  Administered 2022-07-04: 294 mg via INTRAVENOUS
  Filled 2022-07-04: qty 49

## 2022-07-04 MED ORDER — HEPARIN SOD (PORK) LOCK FLUSH 100 UNIT/ML IV SOLN
500.0000 [IU] | Freq: Once | INTRAVENOUS | Status: AC | PRN
  Administered 2022-07-04: 500 [IU]
  Filled 2022-07-04: qty 5

## 2022-07-04 MED ORDER — SODIUM CHLORIDE 0.9 % IV SOLN
498.5000 mg | Freq: Once | INTRAVENOUS | Status: AC
  Administered 2022-07-04: 500 mg via INTRAVENOUS
  Filled 2022-07-04: qty 50

## 2022-07-04 MED ORDER — DIPHENHYDRAMINE HCL 50 MG/ML IJ SOLN
25.0000 mg | Freq: Once | INTRAMUSCULAR | Status: AC
  Administered 2022-07-04: 25 mg via INTRAVENOUS
  Filled 2022-07-04: qty 1

## 2022-07-04 NOTE — Progress Notes (Signed)
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 Claiborne Billings from GYN oncology at [facility].  She underwent robotic surgery on 03/30/2022.  Final pathology showed:   Final Pathologic Diagnosis  A. SENTINEL LYMPH NODE, RIGHT EXTERNAL ILIAC ARTERY, RESECTION: Two lymph nodes, negative for metastatic carcinoma (0/2).    B. SENTINEL LYMPH NODE, LEFT EXTERNAL ILIAC ARTERY, RESECTION: One lymph node, negative for metastatic carcinoma (0/1).    C. UTERUS, FALLOPIAN TUBE AND OVARIES, HYSTERECTOMY WITH BILATERAL SALPINGO-OOPHORECTOMY: Endometrioid adenocarcinoma, FIGO grade 2. Tumor measures 5 cm in greatest dimension.  Tumor invades 14 of 17 mm thick myometrium (greater than 50%).  Margins of resection are uninvolved by tumor.  Tumor involves lower uterine segment.  Cervix is uninvolved by tumor.  Benign right fallopian tube and ovary.  Left ovary with tumor located within lymphovascular spaces, no tissue invasion identified.  Left fallopian tube with tumor within lymphovascular spaces, no tissue invasion identified. Free floating tumor located inside left fallopian tube.  pT1b pN0      She was recommended adjuvant chemotherapy with Norma Fredrickson Taxol for 3 cycles by Dr. Claiborne Billings followed by  vaginal brachytherapy.  Patient states that she does not wish to travel all the way to [facility] given her ongoing fatigue as well as discomfort from episiotomy and would prefer to get treatment locally.  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  She is not a diabetic.  She has baseline rheumatoid arthritis      Interval history-tolerating chemotherapy well without any significant nausea or vomiting.  Peripheral neuropathy is stable.  Reports ongoing fatigue  ECOG PS- 1 Pain scale- 0   Review of systems- Review of Systems  Constitutional:  Positive for malaise/fatigue. Negative for chills, fever and weight loss.  HENT:  Negative for congestion, ear discharge and nosebleeds.   Eyes:  Negative for blurred vision.  Respiratory:  Negative for cough, hemoptysis, sputum production, shortness of breath and wheezing.   Cardiovascular:  Negative for chest pain, palpitations, orthopnea and claudication.  Gastrointestinal:  Negative for abdominal pain, blood in stool, constipation, diarrhea, heartburn, melena, nausea and vomiting.  Genitourinary:  Negative for dysuria, flank pain, frequency, hematuria and urgency.  Musculoskeletal:  Negative for back pain, joint pain and myalgias.  Skin:  Negative for rash.  Neurological:  Negative for dizziness, tingling, focal weakness, seizures, weakness and headaches.  Endo/Heme/Allergies:  Does not bruise/bleed easily.  Psychiatric/Behavioral:  Negative for depression and suicidal ideas. The patient does not have insomnia.       Allergies  Allergen Reactions   Azithromycin Other (See Comments)    Gets a bad yeast infection from this.     Past Medical History:  Diagnosis Date   Allergic rhinitis due to pollen    Colon cancer ([place])    Hyperlipidemia LDL goal <100 02/16/2020   Hypertension 12/19/2016  Past Surgical History:  Procedure Laterality Date   CESAREAN SECTION     COLONOSCOPY WITH PROPOFOL N/A 11/12/2016   Procedure:  COLONOSCOPY WITH PROPOFOL;  Surgeon: Lucilla Lame, MD;  Location: [place];  Service: Endoscopy;  Laterality: N/A;   COLONOSCOPY WITH PROPOFOL N/A 03/29/2017   Procedure: COLONOSCOPY WITH PROPOFOL;  Surgeon: Lucilla Lame, MD;  Location: [place];  Service: Endoscopy;  Laterality: N/A;   COLONOSCOPY WITH PROPOFOL N/A 03/14/2020   Procedure: COLONOSCOPY WITH PROPOFOL;  Surgeon: Lucilla Lame, MD;  Location: [place];  Service: Endoscopy;  Laterality: N/A;  priority 4   IR IMAGING GUIDED PORT INSERTION  06/01/2022   POLYPECTOMY  11/12/2016   Procedure: POLYPECTOMY;  Surgeon: Lucilla Lame, MD;  Location: [place];  Service: Endoscopy;;   POLYPECTOMY  03/29/2017   Procedure: POLYPECTOMY;  Surgeon: Lucilla Lame, MD;  Location: [place];  Service: Endoscopy;;    Social History   Socioeconomic History   Marital status: Married    Spouse name: Not on file   Number of children: Not on file   Years of education: Not on file   Highest education level: Not on file  Occupational History   Not on file  Tobacco Use   Smoking status: Former    Types: Cigarettes    Quit date: 10/22/1978    Years since quitting: 43.7   Smokeless tobacco: Never  Vaping Use   Vaping Use: Never used  Substance and Sexual Activity   Alcohol use: No   Drug use: No   Sexual activity: Yes    Partners: Male  Other Topics Concern   Not on file  Social History Narrative   Not on file   Social Determinants of Health   Financial Resource Strain: Low Risk  (06/22/2022)   Overall Financial Resource Strain (CARDIA)    Difficulty of Paying Living Expenses: Not very hard  Food Insecurity: No Food Insecurity (06/22/2022)   Hunger Vital Sign    Worried About Running Out of Food in the Last Year: Never true    Ran Out of Food in the Last Year: Never true  Transportation Needs: No Transportation Needs (06/22/2022)   PRAPARE - Hydrologist (Medical): No     Lack of Transportation (Non-Medical): No  Physical Activity: Inactive (06/22/2022)   Exercise Vital Sign    Days of Exercise per Week: 0 days    Minutes of Exercise per Session: 0 min  Stress: Stress Concern Present (06/22/2022)   [place]    Feeling of Stress : To some extent  Social Connections: Socially Integrated (06/22/2022)   Social Connection and Isolation Panel [NHANES]    Frequency of Communication with Friends and Family: Three times a week    Frequency of Social Gatherings with Friends and Family: Three times a week    Attends Religious Services: 1 to 4 times per year    Active Member of Clubs or Organizations: No    Attends Archivist Meetings: 1 to 4 times per year    Marital Status: Married  Human resources officer Violence: Not At Risk (06/22/2022)   Humiliation, Afraid, Rape, and Kick questionnaire    Fear of Current or Ex-Partner: No    Emotionally Abused: No    Physically Abused: No    Sexually Abused: No    Family History  Problem Relation Age of Onset   Hepatitis C Mother 81  d/c at 23   Hypertension Father    Hepatitis C Brother    Breast cancer Neg Hx      Current Outpatient Medications:    lidocaine-prilocaine (EMLA) cream, Apply to affected area once, Disp: 30 g, Rfl: 3   lisinopril (ZESTRIL) 40 MG tablet, Take 1 tablet (40 mg total) by mouth daily., Disp: 30 tablet, Rfl: 3   Multiple Vitamin (QUINTABS) TABS, Take 1 tablet by mouth daily., Disp: , Rfl:    Multiple Vitamins-Minerals (MULTIVITAMIN GUMMIES ADULTS PO), Take by mouth daily., Disp: , Rfl:    ondansetron (ZOFRAN-ODT) 8 MG disintegrating tablet, Take 8 mg by mouth every 8 (eight) hours as needed., Disp: , Rfl:    prochlorperazine (COMPAZINE) 10 MG tablet, Take 1 tablet (10 mg total) by mouth every 6 (six) hours as needed for nausea or vomiting., Disp: 60 tablet, Rfl: 2   SYSTANE COMPLETE 0.6 % SOLN, Apply 1 drop to eye daily.,  Disp: , Rfl:    traMADol (ULTRAM) 50 MG tablet, TAKE 1 TABLET(50 MG) BY MOUTH DAILY, Disp: 30 tablet, Rfl: 3   ondansetron (ZOFRAN) 8 MG tablet, Take 1 tablet (8 mg total) by mouth every 8 (eight) hours as needed for nausea or vomiting. (Patient not taking: Reported on 07/04/2022), Disp: 60 tablet, Rfl: 2   rosuvastatin (CRESTOR) 10 MG tablet, Take 1 tablet (10 mg total) by mouth daily. (Patient not taking: Reported on 07/04/2022), Disp: 90 tablet, Rfl: 3 No current facility-administered medications for this visit.  Facility-Administered Medications Ordered in Other Visits:    CARBOplatin (PARAPLATIN) 500 mg in sodium chloride 0.9 % 250 mL chemo infusion, 500 mg, Intravenous, Once, Sindy Guadeloupe, MD   heparin lock flush 100 unit/mL, 500 Units, Intracatheter, Once PRN, Sindy Guadeloupe, MD   PACLitaxel (TAXOL) 294 mg in sodium chloride 0.9 % 250 mL chemo infusion (> '80mg'$ /m2), 175 mg/m2 (Treatment Plan Recorded), Intravenous, Once, Sindy Guadeloupe, MD, Last Rate: 100 mL/hr at 07/04/22 1131, 294 mg at 07/04/22 1131  Physical exam:  Vitals:   07/04/22 0841  BP: (!) 190/82  Pulse: 74  Resp: 16  Temp: 97.9 F (36.6 C)  TempSrc: Oral  Weight: 145 lb 3.2 oz (65.9 kg)  Height: 5' 2.5" (1.588 m)   Physical Exam Constitutional:      General: She is not in acute distress. Cardiovascular:     Rate and Rhythm: Normal rate and regular rhythm.     Heart sounds: Normal heart sounds.  Pulmonary:     Effort: Pulmonary effort is normal.     Breath sounds: Normal breath sounds.  Abdominal:     General: Bowel sounds are normal.     Palpations: Abdomen is soft.  Skin:    General: Skin is warm and dry.  Neurological:     Mental Status: She is alert and oriented to person, place, and time.         Latest Ref Rng & Units 07/04/2022    8:14 AM  CMP  Glucose 70 - 99 mg/dL 122   BUN 8 - 23 mg/dL 13   Creatinine 0.44 - 1.00 mg/dL 0.70   Sodium 135 - 145 mmol/L 139   Potassium 3.5 - 5.1 mmol/L 3.4    Chloride 98 - 111 mmol/L 107   CO2 22 - 32 mmol/L 27   Calcium 8.9 - 10.3 mg/dL 9.1   Total Protein 6.5 - 8.1 g/dL 7.5   Total Bilirubin 0.3 - 1.2 mg/dL 0.8   Alkaline Phos  38 - 126 U/L 55   AST 15 - 41 U/L 27   ALT 0 - 44 U/L 26       Latest Ref Rng & Units 07/04/2022    8:14 AM  CBC  WBC 4.0 - 10.5 K/uL 3.8   Hemoglobin 12.0 - 15.0 g/dL 13.7   Hematocrit 36.0 - 46.0 % 41.5   Platelets 150 - 400 K/uL 200     No images are attached to the encounter.  NM PET Image Restag (PS) Skull Base To Thigh  Result Date: 06/05/2022 CLINICAL DATA:  Subsequent treatment strategy for endometrial carcinoma status post hysterectomy March 30, 2022. EXAM: NUCLEAR MEDICINE PET SKULL BASE TO THIGH TECHNIQUE: 8.1 mCi F-18 FDG was injected intravenously. Full-ring PET imaging was performed from the skull base to thigh after the radiotracer. CT data was obtained and used for attenuation correction and anatomic localization. Fasting blood glucose: 100 mg/dl COMPARISON:  None Available. FINDINGS: Mediastinal blood pool activity: SUV max 1.73 Liver activity: SUV max NA NECK: No hypermetabolic cervical adenopathy. Homogeneous hypermetabolic activity throughout the thyroid gland with a max SUV of 3.3, commonly reflects thyroiditis. Hypodense 14 mm nodule in the right lobe of the thyroid which demonstrates some peripheral calcifications and activity slightly greater than that of background thyroid with a max SUV of 4.9. Incidental CT findings: None. CHEST: No hypermetabolic thoracic adenopathy. No hypermetabolic pulmonary nodules or masses. Incidental CT findings: Right chest Port-A-Cath with tip near the superior cavoatrial junction. No suspicious pulmonary nodules on this motion degraded examination. ABDOMEN/PELVIS: Prior hysterectomy without suspicious hypermetabolic soft tissue nodularity along the vaginal cuff. No abnormal hypermetabolic activity within the liver, pancreas, adrenal glands, or spleen. No hypermetabolic  lymph nodes in the abdomen or pelvis. Incidental CT findings: Mild mesenteric and omental stranding without FDG avid nodularity. SKELETON: No focal hypermetabolic activity to suggest skeletal metastasis. Incidental CT findings: None. IMPRESSION: 1. Status post hysterectomy without suspicious hypermetabolic soft tissue nodularity along the vaginal cuff to suggest residual disease. 2. No convincing evidence of hypermetabolic metastatic disease. 3. Mild mesenteric and omental stranding without abnormal FDG avid nodularity possibly reflecting postsurgical change, suggest attention on follow-up imaging. 4. Incidental 1.4 cm right thyroid nodule with slightly increased uptake. Recommend nonemergent thyroid US and biopsy of nodule if detected. Reference: J Am Coll Radiol. 2015 Feb;12(2): 143-50 5. Slightly increased background thyroid activity is nonspecific but may reflect thyroiditis recommend correlation with laboratory values. 6.  Aortic Atherosclerosis (ICD10-I70.0). Electronically Signed   By: Dahlia Bailiff M.D.   On: 06/05/2022 12:56     Assessment and plan- Patient is a 61 y.o. female  with FIGO stage Ib endometrioid endometrial cancer pT1b N0 M0.  She is here for on treatment assessment prior to cycle 2 of adjuvant CarboTaxol chemotherapy  White cell count is somewhat lower today at 3.8 with an [place] of 1.8.  Counts otherwise okay to proceed with cycle 2 of CarboTaxol chemotherapy today.  I will attempt to get the [facility] approval day 3.  I will see her back in 3 weeks with CBC with differential and CMP for cycle 3 of CarboTaxol chemotherapy followed by repeat scans.  If scans show no evidence of recurrence and referring her to radiation oncology for consideration of vaginal brachytherapy   Visit Diagnosis 1. Endometrial carcinoma ([place])   2. Encounter for antineoplastic chemotherapy   3. Chemotherapy induced neutropenia ([facility])      Dr. Randa Evens, MD, MPH [facility] [phone] 07/04/2022  12:24 PM

## 2022-07-04 NOTE — Patient Instructions (Signed)
[facility] [place]  Discharge Instructions: Thank you for choosing [facility] to provide your oncology and hematology care.  If you have a lab appointment with the Cancer Center, please go directly to the Cancer Center and check in at the registration area.  Wear comfortable clothing and clothing appropriate for easy access to any Portacath or PICC line.   We strive to give you quality time with your provider. You may need to reschedule your appointment if you arrive late (15 or more minutes).  Arriving late affects you and other patients whose appointments are after yours.  Also, if you miss three or more appointments without notifying the office, you may be dismissed from the clinic at the provider's discretion.      For prescription refill requests, have your pharmacy contact our office and allow 72 hours for refills to be completed.    Today you received the following chemotherapy and/or immunotherapy agents Paraplatin & Taxol      To help prevent nausea and vomiting after your treatment, we encourage you to take your nausea medication as directed.  BELOW ARE SYMPTOMS THAT SHOULD BE REPORTED IMMEDIATELY: *FEVER GREATER THAN 100.4 F (38 C) OR HIGHER *CHILLS OR SWEATING *NAUSEA AND VOMITING THAT IS NOT CONTROLLED WITH YOUR NAUSEA MEDICATION *UNUSUAL SHORTNESS OF BREATH *UNUSUAL BRUISING OR BLEEDING *URINARY PROBLEMS (pain or burning when urinating, or frequent urination) *BOWEL PROBLEMS (unusual diarrhea, constipation, pain near the anus) TENDERNESS IN MOUTH AND THROAT WITH OR WITHOUT PRESENCE OF ULCERS (sore throat, sores in mouth, or a toothache) UNUSUAL RASH, SWELLING OR PAIN  UNUSUAL VAGINAL DISCHARGE OR ITCHING   Items with * indicate a potential emergency and should be followed up as soon as possible or go to the Emergency Department if any problems should occur.  Please show the CHEMOTHERAPY ALERT CARD or IMMUNOTHERAPY ALERT CARD at  check-in to the Emergency Department and triage nurse.  Should you have questions after your visit or need to cancel or reschedule your appointment, please contact [facility] [place]  [phone] and follow the prompts.  Office hours are 8:00 a.m. to 4:30 p.m. Monday - Friday. Please note that voicemails left after 4:00 p.m. may not be returned until the following business day.  We are closed weekends and major holidays. You have access to a nurse at all times for urgent questions. Please call the main number to the clinic [phone] and follow the prompts.  For any non-urgent questions, you may also contact your provider using MyChart. We now offer e-Visits for anyone 18 and older to request care online for non-urgent symptoms. For details visit [email or web address].   Also download the MyChart app! Go to the app store, search "MyChart", open the app, select , and log in with your MyChart username and password.  Masks are optional in the cancer centers. If you would like for your care team to wear a mask while they are taking care of you, please let them know. For doctor visits, patients may have with them one support person who is at least 62 years old. At this time, visitors are not allowed in the infusion area.   

## 2022-07-05 ENCOUNTER — Other Ambulatory Visit: Payer: Self-pay

## 2022-07-05 NOTE — Progress Notes (Signed)
Patient is receiving Replacement Medication. Medication: Udenyca Manufacturer: [employer] Approval Dates: Approved from 07/05/2022 until 07/05/2023. Reason: self pay First DOS: 07/06/2022

## 2022-07-06 ENCOUNTER — Inpatient Hospital Stay: Payer: Self-pay

## 2022-07-06 DIAGNOSIS — C541 Malignant neoplasm of endometrium: Secondary | ICD-10-CM

## 2022-07-06 MED ORDER — PEGFILGRASTIM-CBQV 6 MG/0.6ML ~~LOC~~ SOSY
6.0000 mg | PREFILLED_SYRINGE | Freq: Once | SUBCUTANEOUS | Status: AC
  Administered 2022-07-06: 6 mg via SUBCUTANEOUS
  Filled 2022-07-06: qty 0.6

## 2022-07-12 LAB — CA 125

## 2022-07-13 ENCOUNTER — Encounter: Payer: Self-pay | Admitting: Oncology

## 2022-07-15 ENCOUNTER — Encounter: Payer: Self-pay | Admitting: Family Medicine

## 2022-07-16 ENCOUNTER — Other Ambulatory Visit: Payer: Self-pay | Admitting: Family Medicine

## 2022-07-16 MED ORDER — AMLODIPINE BESYLATE 5 MG PO TABS: 5.0000 mg | ORAL_TABLET | Freq: Every day | ORAL | 3 refills | Status: DC

## 2022-07-16 NOTE — Telephone Encounter (Signed)
Last office visit 06/27/2022 for HTN.  Last refilled: Not on current medication list.  Refill note states: The original prescription was discontinued on 05/31/2022 by Manus Rudd, RN for the following reason: Completed Course. Renewing this prescription may not be appropriate.  No future appointments with PCP.

## 2022-07-17 ENCOUNTER — Encounter: Payer: Self-pay | Admitting: Oncology

## 2022-07-19 ENCOUNTER — Other Ambulatory Visit: Payer: Self-pay

## 2022-07-24 MED FILL — Fosaprepitant Dimeglumine For IV Infusion 150 MG (Base Eq): INTRAVENOUS | Qty: 5 | Status: AC

## 2022-07-24 MED FILL — Dexamethasone Sodium Phosphate Inj 100 MG/10ML: INTRAMUSCULAR | Qty: 1 | Status: AC

## 2022-07-25 ENCOUNTER — Encounter: Payer: Self-pay | Admitting: Oncology

## 2022-07-25 ENCOUNTER — Telehealth: Payer: Self-pay

## 2022-07-25 ENCOUNTER — Other Ambulatory Visit: Payer: Self-pay

## 2022-07-25 ENCOUNTER — Inpatient Hospital Stay: Payer: Self-pay

## 2022-07-25 ENCOUNTER — Inpatient Hospital Stay: Payer: Self-pay | Attending: Oncology | Admitting: Oncology

## 2022-07-25 VITALS — BP 136/82 | HR 71 | Temp 97.9°F | Resp 16 | Ht 62.5 in | Wt 147.1 lb

## 2022-07-25 DIAGNOSIS — Z5111 Encounter for antineoplastic chemotherapy: Secondary | ICD-10-CM | POA: Insufficient documentation

## 2022-07-25 DIAGNOSIS — C541 Malignant neoplasm of endometrium: Secondary | ICD-10-CM | POA: Insufficient documentation

## 2022-07-25 DIAGNOSIS — Z79899 Other long term (current) drug therapy: Secondary | ICD-10-CM | POA: Insufficient documentation

## 2022-07-25 LAB — COMPREHENSIVE METABOLIC PANEL
ALT: 28 U/L (ref 0–44)
AST: 27 U/L (ref 15–41)
Albumin: 4.2 g/dL (ref 3.5–5.0)
Alkaline Phosphatase: 66 U/L (ref 38–126)
Anion gap: 7 (ref 5–15)
BUN: 13 mg/dL (ref 8–23)
CO2: 28 mmol/L (ref 22–32)
Calcium: 9.1 mg/dL (ref 8.9–10.3)
Chloride: 105 mmol/L (ref 98–111)
Creatinine, Ser: 0.68 mg/dL (ref 0.44–1.00)
GFR, Estimated: >60 mL/min (ref >60)
Glucose, Bld: 133 mg/dL — ABNORMAL HIGH (ref 70–99)
Potassium: 3.5 mmol/L (ref 3.5–5.1)
Sodium: 140 mmol/L (ref 135–145)
Total Bilirubin: 0.5 mg/dL (ref 0.3–1.2)
Total Protein: 7.2 g/dL (ref 6.5–8.1)

## 2022-07-25 LAB — CBC WITH DIFFERENTIAL/PLATELET
Abs Immature Granulocytes: 0.01 10*3/uL (ref 0.00–0.07)
Basophils Absolute: 0 10*3/uL (ref 0.0–0.1)
Basophils Relative: 1 %
Eosinophils Absolute: 0 10*3/uL (ref 0.0–0.5)
Eosinophils Relative: 1 %
HCT: 36.5 % (ref 36.0–46.0)
Hemoglobin: 12.3 g/dL (ref 12.0–15.0)
Immature Granulocytes: 0 %
Lymphocytes Relative: 28 %
Lymphs Abs: 1.1 10*3/uL (ref 0.7–4.0)
MCH: 27.2 pg (ref 26.0–34.0)
MCHC: 33.7 g/dL (ref 30.0–36.0)
MCV: 80.6 fL (ref 80.0–100.0)
Monocytes Absolute: 0.5 10*3/uL (ref 0.1–1.0)
Monocytes Relative: 11 %
Neutro Abs: 2.4 10*3/uL (ref 1.7–7.7)
Neutrophils Relative %: 59 %
Platelets: 228 10*3/uL (ref 150–400)
RBC: 4.53 MIL/uL (ref 3.87–5.11)
RDW: 16 % — ABNORMAL HIGH (ref 11.5–15.5)
WBC: 4 10*3/uL (ref 4.0–10.5)
nRBC: 0 % (ref 0.0–0.2)

## 2022-07-25 MED ORDER — SODIUM CHLORIDE 0.9 % IV SOLN
10.0000 mg | Freq: Once | INTRAVENOUS | Status: AC
  Administered 2022-07-25: 10 mg via INTRAVENOUS
  Filled 2022-07-25: qty 10

## 2022-07-25 MED ORDER — SODIUM CHLORIDE 0.9 % IV SOLN
498.5000 mg | Freq: Once | INTRAVENOUS | Status: AC
  Administered 2022-07-25: 500 mg via INTRAVENOUS
  Filled 2022-07-25: qty 50

## 2022-07-25 MED ORDER — HEPARIN SOD (PORK) LOCK FLUSH 100 UNIT/ML IV SOLN
500.0000 [IU] | Freq: Once | INTRAVENOUS | Status: AC | PRN
  Administered 2022-07-25: 500 [IU]
  Filled 2022-07-25: qty 5

## 2022-07-25 MED ORDER — FAMOTIDINE IN NACL 20-0.9 MG/50ML-% IV SOLN
20.0000 mg | Freq: Once | INTRAVENOUS | Status: AC
  Administered 2022-07-25: 20 mg via INTRAVENOUS
  Filled 2022-07-25: qty 50

## 2022-07-25 MED ORDER — DIPHENHYDRAMINE HCL 50 MG/ML IJ SOLN
25.0000 mg | Freq: Once | INTRAMUSCULAR | Status: AC
  Administered 2022-07-25: 25 mg via INTRAVENOUS
  Filled 2022-07-25: qty 1

## 2022-07-25 MED ORDER — SODIUM CHLORIDE 0.9 % IV SOLN
175.0000 mg/m2 | Freq: Once | INTRAVENOUS | Status: AC
  Administered 2022-07-25: 294 mg via INTRAVENOUS
  Filled 2022-07-25: qty 49

## 2022-07-25 MED ORDER — SODIUM CHLORIDE 0.9 % IV SOLN
150.0000 mg | Freq: Once | INTRAVENOUS | Status: AC
  Administered 2022-07-25: 150 mg via INTRAVENOUS
  Filled 2022-07-25: qty 150

## 2022-07-25 MED ORDER — PALONOSETRON HCL INJECTION 0.25 MG/5ML
0.2500 mg | Freq: Once | INTRAVENOUS | Status: AC
  Administered 2022-07-25: 0.25 mg via INTRAVENOUS
  Filled 2022-07-25: qty 5

## 2022-07-25 MED ORDER — SODIUM CHLORIDE 0.9 % IV SOLN
Freq: Once | INTRAVENOUS | Status: AC
  Filled 2022-07-25: qty 250

## 2022-07-25 NOTE — Patient Instructions (Signed)
[facility] [place]  Discharge Instructions: Thank you for choosing [facility] to provide your oncology and hematology care.  If you have a lab appointment with the Cancer Center, please go directly to the Cancer Center and check in at the registration area.  Wear comfortable clothing and clothing appropriate for easy access to any Portacath or PICC line.   We strive to give you quality time with your provider. You may need to reschedule your appointment if you arrive late (15 or more minutes).  Arriving late affects you and other patients whose appointments are after yours.  Also, if you miss three or more appointments without notifying the office, you may be dismissed from the clinic at the provider's discretion.      For prescription refill requests, have your pharmacy contact our office and allow 72 hours for refills to be completed.    Today you received the following chemotherapy and/or immunotherapy agents Paraplatin & Taxol      To help prevent nausea and vomiting after your treatment, we encourage you to take your nausea medication as directed.  BELOW ARE SYMPTOMS THAT SHOULD BE REPORTED IMMEDIATELY: *FEVER GREATER THAN 100.4 F (38 C) OR HIGHER *CHILLS OR SWEATING *NAUSEA AND VOMITING THAT IS NOT CONTROLLED WITH YOUR NAUSEA MEDICATION *UNUSUAL SHORTNESS OF BREATH *UNUSUAL BRUISING OR BLEEDING *URINARY PROBLEMS (pain or burning when urinating, or frequent urination) *BOWEL PROBLEMS (unusual diarrhea, constipation, pain near the anus) TENDERNESS IN MOUTH AND THROAT WITH OR WITHOUT PRESENCE OF ULCERS (sore throat, sores in mouth, or a toothache) UNUSUAL RASH, SWELLING OR PAIN  UNUSUAL VAGINAL DISCHARGE OR ITCHING   Items with * indicate a potential emergency and should be followed up as soon as possible or go to the Emergency Department if any problems should occur.  Please show the CHEMOTHERAPY ALERT CARD or IMMUNOTHERAPY ALERT CARD at  check-in to the Emergency Department and triage nurse.  Should you have questions after your visit or need to cancel or reschedule your appointment, please contact [facility] [place]  [phone] and follow the prompts.  Office hours are 8:00 a.m. to 4:30 p.m. Monday - Friday. Please note that voicemails left after 4:00 p.m. may not be returned until the following business day.  We are closed weekends and major holidays. You have access to a nurse at all times for urgent questions. Please call the main number to the clinic [phone] and follow the prompts.  For any non-urgent questions, you may also contact your provider using MyChart. We now offer e-Visits for anyone 18 and older to request care online for non-urgent symptoms. For details visit [email or web address].   Also download the MyChart app! Go to the app store, search "MyChart", open the app, select [place], and log in with your MyChart username and password.  Masks are optional in the cancer centers. If you would like for your care team to wear a mask while they are taking care of you, please let them know. For doctor visits, patients may have with them one support person who is at least 62 years old. At this time, visitors are not allowed in the infusion area.   

## 2022-07-25 NOTE — Telephone Encounter (Signed)
Consultation request form sent to [facility] to have surgical pathology, [id number], collected 03/30/2022, reviewed at [facility]. Confirmation of receipt received.

## 2022-07-25 NOTE — Progress Notes (Signed)
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 Claiborne Billings from GYN oncology at [facility].  She underwent robotic surgery on 03/30/2022.  Final pathology showed:    Final Pathologic Diagnosis  A. SENTINEL LYMPH NODE, RIGHT EXTERNAL ILIAC ARTERY, RESECTION: Two lymph nodes, negative for metastatic carcinoma (0/2).    B. SENTINEL LYMPH NODE, LEFT EXTERNAL ILIAC ARTERY, RESECTION: One lymph node, negative for metastatic carcinoma (0/1).    C. UTERUS, FALLOPIAN TUBE AND OVARIES, HYSTERECTOMY WITH BILATERAL SALPINGO-OOPHORECTOMY: Endometrioid adenocarcinoma, FIGO grade 2. Tumor measures 5 cm in greatest dimension.  Tumor invades 14 of 17 mm thick myometrium (greater than 50%).  Margins of resection are uninvolved by tumor.  Tumor involves lower uterine segment.  Cervix is uninvolved by tumor.  Benign right fallopian tube and ovary.  Left ovary with tumor located within lymphovascular spaces, no tissue invasion identified.  Left fallopian tube with tumor within lymphovascular spaces, no tissue invasion identified. Free floating tumor located inside left fallopian tube.  pT1b pN0      She was recommended adjuvant chemotherapy with Norma Fredrickson Taxol for 3 cycles by Dr. Claiborne Billings followed  by vaginal brachytherapy.  Patient states that she does not wish to travel all the way to [facility] given her ongoing fatigue as well as discomfort from episiotomy and would prefer to get treatment locally.  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  She is not a diabetic.  She has baseline rheumatoid arthritis      Interval history-patient does report fatigue a week after chemotherapy but usually gets better after that.  She remains independent of her ADLs and IADLs.  Denies any worsening neuropathy at this time  ECOG PS- 1 Pain scale- 0   Review of systems- Review of Systems  Constitutional:  Positive for malaise/fatigue. Negative for chills, fever and weight loss.  HENT:  Negative for congestion, ear discharge and nosebleeds.   Eyes:  Negative for blurred vision.  Respiratory:  Negative for cough, hemoptysis, sputum production, shortness of breath and wheezing.   Cardiovascular:  Negative for chest pain, palpitations, orthopnea and claudication.  Gastrointestinal:  Negative for abdominal pain, blood in stool, constipation, diarrhea, heartburn, melena, nausea and vomiting.  Genitourinary:  Negative for dysuria, flank pain, frequency, hematuria and urgency.  Musculoskeletal:  Negative for back pain, joint pain and myalgias.  Skin:  Negative for rash.  Neurological:  Negative for dizziness, tingling, focal weakness, seizures, weakness and headaches.  Endo/Heme/Allergies:  Does not bruise/bleed easily.  Psychiatric/Behavioral:  Negative for depression and suicidal ideas. The patient does not have insomnia.       Allergies  Allergen Reactions   Azithromycin Other (See Comments)    Gets a bad yeast infection from this.     Past Medical History:  Diagnosis Date   Allergic rhinitis due to pollen  Endometrial cancer ([place])    Hyperlipidemia LDL goal <100 02/16/2020   Hypertension 12/19/2016     Past Surgical History:  Procedure Laterality Date   CESAREAN SECTION      COLONOSCOPY WITH PROPOFOL N/A 11/12/2016   Procedure: COLONOSCOPY WITH PROPOFOL;  Surgeon: Lucilla Lame, MD;  Location: [place];  Service: Endoscopy;  Laterality: N/A;   COLONOSCOPY WITH PROPOFOL N/A 03/29/2017   Procedure: COLONOSCOPY WITH PROPOFOL;  Surgeon: Lucilla Lame, MD;  Location: [place];  Service: Endoscopy;  Laterality: N/A;   COLONOSCOPY WITH PROPOFOL N/A 03/14/2020   Procedure: COLONOSCOPY WITH PROPOFOL;  Surgeon: Lucilla Lame, MD;  Location: [place];  Service: Endoscopy;  Laterality: N/A;  priority 4   IR IMAGING GUIDED PORT INSERTION  06/01/2022   POLYPECTOMY  11/12/2016   Procedure: POLYPECTOMY;  Surgeon: Lucilla Lame, MD;  Location: [place];  Service: Endoscopy;;   POLYPECTOMY  03/29/2017   Procedure: POLYPECTOMY;  Surgeon: Lucilla Lame, MD;  Location: [place];  Service: Endoscopy;;    Social History   Socioeconomic History   Marital status: Married    Spouse name: Not on file   Number of children: Not on file   Years of education: Not on file   Highest education level: Not on file  Occupational History   Not on file  Tobacco Use   Smoking status: Former    Types: Cigarettes    Quit date: 10/22/1978    Years since quitting: 43.7   Smokeless tobacco: Never  Vaping Use   Vaping Use: Never used  Substance and Sexual Activity   Alcohol use: No   Drug use: No   Sexual activity: Yes    Partners: Male  Other Topics Concern   Not on file  Social History Narrative   Not on file   Social Determinants of Health   Financial Resource Strain: Low Risk  (06/22/2022)   Overall Financial Resource Strain (CARDIA)    Difficulty of Paying Living Expenses: Not very hard  Food Insecurity: No Food Insecurity (06/22/2022)   Hunger Vital Sign    Worried About Running Out of Food in the Last Year: Never true    Ran Out of Food in the Last Year: Never true  Transportation Needs: No Transportation Needs (06/22/2022)   PRAPARE -  Hydrologist (Medical): No    Lack of Transportation (Non-Medical): No  Physical Activity: Inactive (06/22/2022)   Exercise Vital Sign    Days of Exercise per Week: 0 days    Minutes of Exercise per Session: 0 min  Stress: Stress Concern Present (06/22/2022)   [place]    Feeling of Stress : To some extent  Social Connections: Socially Integrated (06/22/2022)   Social Connection and Isolation Panel [NHANES]    Frequency of Communication with Friends and Family: Three times a week    Frequency of Social Gatherings with Friends and Family: Three times a week    Attends Religious Services: 1 to 4 times per year    Active Member of Clubs or Organizations: No    Attends Archivist Meetings: 1 to 4 times per year    Marital Status: Married  Human resources officer Violence: Not At Risk (06/22/2022)   Humiliation, Afraid, Rape, and Kick questionnaire    Fear of Current or Ex-Partner: No    Emotionally Abused: No    Physically Abused: No    Sexually Abused: No  Family History  Problem Relation Age of Onset   Hepatitis C Mother 18       d/c at 58   Hypertension Father    Hepatitis C Brother    Breast cancer Neg Hx      Current Outpatient Medications:    amLODipine (NORVASC) 5 MG tablet, Take 1 tablet (5 mg total) by mouth daily., Disp: 30 tablet, Rfl: 3   diazepam (VALIUM) 5 MG tablet, TAKE 1 TABLET(5 MG) BY MOUTH EVERY 6 HOURS AS NEEDED FOR ANXIETY, Disp: 30 tablet, Rfl: 3   lidocaine-prilocaine (EMLA) cream, Apply to affected area once, Disp: 30 g, Rfl: 3   lisinopril (ZESTRIL) 40 MG tablet, Take 1 tablet (40 mg total) by mouth daily., Disp: 30 tablet, Rfl: 3   Multiple Vitamin (QUINTABS) TABS, Take 1 tablet by mouth daily., Disp: , Rfl:    Multiple Vitamins-Minerals (MULTIVITAMIN GUMMIES ADULTS PO), Take by mouth daily., Disp: , Rfl:    prochlorperazine (COMPAZINE) 10 MG tablet, Take 1  tablet (10 mg total) by mouth every 6 (six) hours as needed for nausea or vomiting., Disp: 60 tablet, Rfl: 2   SYSTANE COMPLETE 0.6 % SOLN, Apply 1 drop to eye daily., Disp: , Rfl:    traMADol (ULTRAM) 50 MG tablet, TAKE 1 TABLET(50 MG) BY MOUTH DAILY, Disp: 30 tablet, Rfl: 3   ondansetron (ZOFRAN) 8 MG tablet, Take 1 tablet (8 mg total) by mouth every 8 (eight) hours as needed for nausea or vomiting. (Patient not taking: Reported on 07/04/2022), Disp: 60 tablet, Rfl: 2   ondansetron (ZOFRAN-ODT) 8 MG disintegrating tablet, Take 8 mg by mouth every 8 (eight) hours as needed. (Patient not taking: Reported on 07/25/2022), Disp: , Rfl:    rosuvastatin (CRESTOR) 10 MG tablet, Take 1 tablet (10 mg total) by mouth daily. (Patient not taking: Reported on 07/04/2022), Disp: 90 tablet, Rfl: 3 No current facility-administered medications for this visit.  Facility-Administered Medications Ordered in Other Visits:    CARBOplatin (PARAPLATIN) 500 mg in sodium chloride 0.9 % 250 mL chemo infusion, 500 mg, Intravenous, Once, Sindy Guadeloupe, MD   dexamethasone (DECADRON) 10 mg in sodium chloride 0.9 % 50 mL IVPB, 10 mg, Intravenous, Once, Sindy Guadeloupe, MD, Last Rate: 204 mL/hr at 07/25/22 0924, 10 mg at 07/25/22 0924   famotidine (PEPCID) IVPB 20 mg premix, 20 mg, Intravenous, Once, Sindy Guadeloupe, MD   fosaprepitant (EMEND) 150 mg in sodium chloride 0.9 % 145 mL IVPB, 150 mg, Intravenous, Once, Sindy Guadeloupe, MD   heparin lock flush 100 unit/mL, 500 Units, Intracatheter, Once PRN, Sindy Guadeloupe, MD   PACLitaxel (TAXOL) 294 mg in sodium chloride 0.9 % 250 mL chemo infusion (> '80mg'$ /m2), 175 mg/m2 (Treatment Plan Recorded), Intravenous, Once, Sindy Guadeloupe, MD  Physical exam:  Vitals:   07/25/22 0900  BP: 136/82  Pulse: 71  Resp: 16  Temp: 97.9 F (36.6 C)  TempSrc: Oral  Weight: 147 lb 1.6 oz (66.7 kg)  Height: 5' 2.5" (1.588 m)   Physical Exam Constitutional:      General: She is not in acute  distress. Cardiovascular:     Rate and Rhythm: Normal rate and regular rhythm.     Heart sounds: Normal heart sounds.  Pulmonary:     Effort: Pulmonary effort is normal.     Breath sounds: Normal breath sounds.  Abdominal:     General: Bowel sounds are normal.     Palpations: Abdomen is soft.  Skin:  General: Skin is warm and dry.  Neurological:     Mental Status: She is alert and oriented to person, place, and time.         Latest Ref Rng & Units 07/25/2022    8:11 AM  CMP  Glucose 70 - 99 mg/dL 133   BUN 8 - 23 mg/dL 13   Creatinine 0.44 - 1.00 mg/dL 0.68   Sodium 135 - 145 mmol/L 140   Potassium 3.5 - 5.1 mmol/L 3.5   Chloride 98 - 111 mmol/L 105   CO2 22 - 32 mmol/L 28   Calcium 8.9 - 10.3 mg/dL 9.1   Total Protein 6.5 - 8.1 g/dL 7.2   Total Bilirubin 0.3 - 1.2 mg/dL 0.5   Alkaline Phos 38 - 126 U/L 66   AST 15 - 41 U/L 27   ALT 0 - 44 U/L 28       Latest Ref Rng & Units 07/25/2022    8:11 AM  CBC  WBC 4.0 - 10.5 K/uL 4.0   Hemoglobin 12.0 - 15.0 g/dL 12.3   Hematocrit 36.0 - 46.0 % 36.5   Platelets 150 - 400 K/uL 228     No images are attached to the encounter.  No results found.   Assessment and plan- Patient is a 61 y.o. female  with FIGO stage Ib endometrioid endometrial cancer pT1b N0 M0.  She is here for on treatment assessment prior to cycle 3 of CarboTaxol chemotherapy  White cell count is normal today at 4 with an ANC of 2.4.  Counts okay to proceed with CarboTaxol chemotherapy today.  I am not giving her Udenyca with this cycle as she has significant pain associated with it.  I am referring her to radiation oncology at this time for consideration for vaginal brachytherapy.  Patient wishes to follow-up with GYN oncology here.  I have personally spoken to Dr. Theora Gianotti who will be seeing her in 4 weeks time.  Dr. Theora Gianotti also plans to discuss her case at [facility] tumor board to decide if patient needs 3 versus 6 cycles of chemotherapy given lymphovascular  involvement of the ovary without gross tumor invasion.  I explained all this to the patient in detail.  As of now plan is to proceed with vaginal brachytherapy after 3 cycles of chemotherapy and if 6 cycles are needed she will complete 3 more cycles upon completion of radiation.  I will plan to obtain interim CT chest abdomen and pelvis with contrast in about 2 weeks time and see her back in 3 weeks   Visit Diagnosis 1. Endometrial carcinoma ([place])   2. Encounter for antineoplastic chemotherapy      Dr. Randa Evens, MD, MPH [facility] at [facility] [phone] 07/25/2022 9:36 AM

## 2022-07-25 NOTE — Progress Notes (Signed)
Nutrition Follow-up:   Patient with endometrial cancer.  Patient receiving carboplatin and taxol.  Planning radiation and then possibly 3 mores cycles of chemo if needed after radiation.  Metr with patient during infusion.  Patient reports that her appetite is good.  Does have nausea first week after treatment but uses nausea medication then tums if needed after that.  Eating fish, eggs, yogurt, kefir, vegetables and fruit.  Denies constipation or diarrhea.  Does not like oral nutrition supplement shakes.    Medications: reviewed  Labs: reviewed  Anthropometrics:   Weight 147 lb today increased  144 lb on 8/23 151 lb on 03/23/22  NUTRITION DIAGNOSIS: Inadequate oral intake improved    INTERVENTION:  Continue foods high in protein along with plant foods     MONITORING, EVALUATION, GOAL: weight trends, intake   NEXT VISIT: as needed  Jaime Johnson, [place], [place] Registered Dietitian [phone]

## 2022-07-26 ENCOUNTER — Other Ambulatory Visit: Payer: Disability Insurance

## 2022-07-26 ENCOUNTER — Telehealth: Payer: Self-pay

## 2022-07-26 ENCOUNTER — Other Ambulatory Visit: Payer: Self-pay

## 2022-07-26 NOTE — Telephone Encounter (Signed)
Received call from [facility] at [facility] pathology. She states they have not received slides and a block back from [place] [facility]. Contacted [place] pathology and received the tracking information. The slides were signed for by [facility] pathology on 06/06/22 by a J. Olevia Bowens. They were sent to the attention of [facility]. Desiree will look for the filing of these again and notify us is any issues with locating.

## 2022-08-08 ENCOUNTER — Ambulatory Visit
Admission: RE | Admit: 2022-08-08 | Discharge: 2022-08-08 | Disposition: A | Discharge status: Home / Self Care | Payer: Disability Insurance | Source: Ambulatory Visit | Attending: Oncology | Admitting: Oncology

## 2022-08-08 DIAGNOSIS — C541 Malignant neoplasm of endometrium: Secondary | ICD-10-CM | POA: Insufficient documentation

## 2022-08-08 MED ORDER — IOHEXOL 300 MG/ML  SOLN
100.0000 mL | Freq: Once | INTRAMUSCULAR | Status: AC | PRN
  Administered 2022-08-08: 100 mL via INTRAVENOUS

## 2022-08-13 ENCOUNTER — Encounter: Payer: Self-pay | Admitting: Oncology

## 2022-08-15 ENCOUNTER — Other Ambulatory Visit: Payer: Self-pay | Admitting: *Deleted

## 2022-08-15 ENCOUNTER — Ambulatory Visit: Payer: Disability Insurance

## 2022-08-15 ENCOUNTER — Other Ambulatory Visit: Payer: Disability Insurance

## 2022-08-15 ENCOUNTER — Ambulatory Visit: Payer: Disability Insurance | Admitting: Internal Medicine

## 2022-08-15 ENCOUNTER — Other Ambulatory Visit: Payer: Self-pay

## 2022-08-15 DIAGNOSIS — C541 Malignant neoplasm of endometrium: Secondary | ICD-10-CM

## 2022-08-16 ENCOUNTER — Ambulatory Visit: Payer: Disability Insurance

## 2022-08-16 ENCOUNTER — Inpatient Hospital Stay (HOSPITAL_BASED_OUTPATIENT_CLINIC_OR_DEPARTMENT_OTHER): Payer: Self-pay | Admitting: Oncology

## 2022-08-16 ENCOUNTER — Inpatient Hospital Stay: Payer: Self-pay

## 2022-08-16 ENCOUNTER — Ambulatory Visit
Admission: RE | Admit: 2022-08-16 | Discharge: 2022-08-16 | Disposition: A | Discharge status: Home / Self Care | Payer: Self-pay | Source: Ambulatory Visit | Attending: Radiation Oncology | Admitting: Radiation Oncology

## 2022-08-16 ENCOUNTER — Ambulatory Visit: Payer: Disability Insurance | Admitting: Oncology

## 2022-08-16 ENCOUNTER — Other Ambulatory Visit: Payer: Disability Insurance

## 2022-08-16 ENCOUNTER — Encounter: Payer: Self-pay | Admitting: Oncology

## 2022-08-16 VITALS — BP 165/86 | HR 69 | Temp 98.0°F | Resp 16 | Ht 62.5 in | Wt 149.3 lb

## 2022-08-16 DIAGNOSIS — C541 Malignant neoplasm of endometrium: Secondary | ICD-10-CM

## 2022-08-16 DIAGNOSIS — Z7189 Other specified counseling: Secondary | ICD-10-CM

## 2022-08-16 LAB — CBC WITH DIFFERENTIAL/PLATELET
Abs Immature Granulocytes: 0.01 10*3/uL (ref 0.00–0.07)
Basophils Absolute: 0 10*3/uL (ref 0.0–0.1)
Basophils Relative: 1 %
Eosinophils Absolute: 0 10*3/uL (ref 0.0–0.5)
Eosinophils Relative: 1 %
HCT: 42.6 % (ref 36.0–46.0)
Hemoglobin: 14.1 g/dL (ref 12.0–15.0)
Immature Granulocytes: 0 %
Lymphocytes Relative: 39 %
Lymphs Abs: 1.2 10*3/uL (ref 0.7–4.0)
MCH: 27.4 pg (ref 26.0–34.0)
MCHC: 33.1 g/dL (ref 30.0–36.0)
MCV: 82.7 fL (ref 80.0–100.0)
Monocytes Absolute: 0.3 10*3/uL (ref 0.1–1.0)
Monocytes Relative: 10 %
Neutro Abs: 1.6 10*3/uL — ABNORMAL LOW (ref 1.7–7.7)
Neutrophils Relative %: 49 %
Platelets: 154 10*3/uL (ref 150–400)
RBC: 5.15 MIL/uL — ABNORMAL HIGH (ref 3.87–5.11)
RDW: 15.8 % — ABNORMAL HIGH (ref 11.5–15.5)
WBC: 3.2 10*3/uL — ABNORMAL LOW (ref 4.0–10.5)
nRBC: 0 % (ref 0.0–0.2)

## 2022-08-16 LAB — COMPREHENSIVE METABOLIC PANEL
ALT: 38 U/L (ref 0–44)
AST: 30 U/L (ref 15–41)
Albumin: 4.5 g/dL (ref 3.5–5.0)
Alkaline Phosphatase: 64 U/L (ref 38–126)
Anion gap: 7 (ref 5–15)
BUN: 12 mg/dL (ref 8–23)
CO2: 28 mmol/L (ref 22–32)
Calcium: 9.2 mg/dL (ref 8.9–10.3)
Chloride: 105 mmol/L (ref 98–111)
Creatinine, Ser: 0.79 mg/dL (ref 0.44–1.00)
GFR, Estimated: >60 mL/min (ref >60)
Glucose, Bld: 115 mg/dL — ABNORMAL HIGH (ref 70–99)
Potassium: 3.8 mmol/L (ref 3.5–5.1)
Sodium: 140 mmol/L (ref 135–145)
Total Bilirubin: 0.7 mg/dL (ref 0.3–1.2)
Total Protein: 7.8 g/dL (ref 6.5–8.1)

## 2022-08-16 NOTE — Progress Notes (Signed)
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 Claiborne Billings from GYN oncology at [facility].  She underwent robotic surgery on 03/30/2022.  Final pathology showed:    Final Pathologic Diagnosis  A. SENTINEL LYMPH NODE, RIGHT EXTERNAL ILIAC ARTERY, RESECTION: Two lymph nodes, negative for metastatic carcinoma (0/2).    B. SENTINEL LYMPH NODE, LEFT EXTERNAL ILIAC ARTERY, RESECTION: One lymph node, negative for metastatic carcinoma (0/1).    C. UTERUS, FALLOPIAN TUBE AND OVARIES, HYSTERECTOMY WITH BILATERAL SALPINGO-OOPHORECTOMY: Endometrioid adenocarcinoma, FIGO grade 2. Tumor measures 5 cm in greatest dimension.  Tumor invades 14 of 17 mm thick myometrium (greater than 50%).  Margins of resection are uninvolved by tumor.  Tumor involves lower uterine segment.  Cervix is uninvolved by tumor.  Benign right fallopian tube and ovary.  Left ovary with tumor located within lymphovascular spaces, no tissue invasion identified.  Left fallopian tube with tumor within lymphovascular spaces, no tissue invasion identified. Free floating tumor located inside left fallopian tube.  pT1b pN0      She was recommended adjuvant chemotherapy with Norma Fredrickson Taxol for 3 cycles by Dr. Claiborne Billings followed by vaginal  brachytherapy.  Patient states that she does not wish to travel all the way to [facility] given her ongoing fatigue as well as discomfort from episiotomy and would prefer to get treatment locally.  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  She is not a diabetic.  She has baseline rheumatoid arthritis  Pathology was also second for a second opinion to [facility] as well.  Path report over there suggested that this was a T3a tumor FIGO stage IIIa disease    Interval history-patient still feels fatigued since her chemotherapy.  Neuropathy is overall stable.  Denies any new complaints at this time  ECOG PS- 1 Pain scale- 0   Review of systems- Review of Systems  Constitutional:  Positive for malaise/fatigue. Negative for chills, fever and weight loss.  HENT:  Negative for congestion, ear discharge and nosebleeds.   Eyes:  Negative for blurred vision.  Respiratory:  Negative for cough, hemoptysis, sputum production, shortness of breath and wheezing.   Cardiovascular:  Negative for chest pain, palpitations, orthopnea and claudication.  Gastrointestinal:  Negative for abdominal pain, blood in stool, constipation, diarrhea, heartburn, melena, nausea and vomiting.  Genitourinary:  Negative for dysuria, flank pain, frequency, hematuria and urgency.  Musculoskeletal:  Negative for back pain, joint pain and myalgias.  Skin:  Negative for rash.  Neurological:  Negative for dizziness, tingling, focal weakness, seizures, weakness and headaches.  Endo/Heme/Allergies:  Does not bruise/bleed easily.  Psychiatric/Behavioral:  Negative for depression and suicidal ideas. The patient does not have insomnia.       Allergies  Allergen Reactions   Azithromycin Other (See Comments)    Gets a bad yeast infection from this.     Past Medical History:  Diagnosis  Date   Allergic rhinitis due to pollen    Endometrial cancer ([place])    Hyperlipidemia LDL goal <100 02/16/2020   Hypertension 12/19/2016      Past Surgical History:  Procedure Laterality Date   CESAREAN SECTION     COLONOSCOPY WITH PROPOFOL N/A 11/12/2016   Procedure: COLONOSCOPY WITH PROPOFOL;  Surgeon: Lucilla Lame, MD;  Location: [place];  Service: Endoscopy;  Laterality: N/A;   COLONOSCOPY WITH PROPOFOL N/A 03/29/2017   Procedure: COLONOSCOPY WITH PROPOFOL;  Surgeon: Lucilla Lame, MD;  Location: [place];  Service: Endoscopy;  Laterality: N/A;   COLONOSCOPY WITH PROPOFOL N/A 03/14/2020   Procedure: COLONOSCOPY WITH PROPOFOL;  Surgeon: Lucilla Lame, MD;  Location: [place];  Service: Endoscopy;  Laterality: N/A;  priority 4   IR IMAGING GUIDED PORT INSERTION  06/01/2022   POLYPECTOMY  11/12/2016   Procedure: POLYPECTOMY;  Surgeon: Lucilla Lame, MD;  Location: [place];  Service: Endoscopy;;   POLYPECTOMY  03/29/2017   Procedure: POLYPECTOMY;  Surgeon: Lucilla Lame, MD;  Location: [place];  Service: Endoscopy;;    Social History   Socioeconomic History   Marital status: Married    Spouse name: Not on file   Number of children: Not on file   Years of education: Not on file   Highest education level: Not on file  Occupational History   Not on file  Tobacco Use   Smoking status: Former    Types: Cigarettes    Quit date: 10/22/1978    Years since quitting: 43.8   Smokeless tobacco: Never  Vaping Use   Vaping Use: Never used  Substance and Sexual Activity   Alcohol use: No   Drug use: No   Sexual activity: Yes    Partners: Male  Other Topics Concern   Not on file  Social History Narrative   Not on file   Social Determinants of Health   Financial Resource Strain: Low Risk  (06/22/2022)   Overall Financial Resource Strain (CARDIA)    Difficulty of Paying Living Expenses: Not very hard  Food Insecurity: No Food Insecurity (06/22/2022)   Hunger Vital Sign    Worried About Running Out of Food in the Last Year: Never true    Ran Out of Food in the Last Year: Never  true  Transportation Needs: No Transportation Needs (06/22/2022)   PRAPARE - Hydrologist (Medical): No    Lack of Transportation (Non-Medical): No  Physical Activity: Inactive (06/22/2022)   Exercise Vital Sign    Days of Exercise per Week: 0 days    Minutes of Exercise per Session: 0 min  Stress: Stress Concern Present (06/22/2022)   [place]    Feeling of Stress : To some extent  Social Connections: Socially Integrated (06/22/2022)   Social Connection and Isolation Panel [NHANES]    Frequency of Communication with Friends and Family: Three times a week    Frequency of Social Gatherings with Friends and Family: Three times a week    Attends Religious Services: 1 to 4 times per year    Active Member of Clubs or Organizations: No    Attends Archivist Meetings: 1 to 4 times per year    Marital Status: Married  Human resources officer Violence: Not At Risk (06/22/2022)   Humiliation, Afraid, Rape, and Kick questionnaire    Fear of Current or Ex-Partner: No    Emotionally Abused: No  Physically Abused: No    Sexually Abused: No    Family History  Problem Relation Age of Onset   Hepatitis C Mother 59       d/c at 46   Hypertension Father    Hepatitis C Brother    Breast cancer Neg Hx      Current Outpatient Medications:    amLODipine (NORVASC) 5 MG tablet, Take 1 tablet (5 mg total) by mouth daily., Disp: 30 tablet, Rfl: 3   diazepam (VALIUM) 5 MG tablet, TAKE 1 TABLET(5 MG) BY MOUTH EVERY 6 HOURS AS NEEDED FOR ANXIETY, Disp: 30 tablet, Rfl: 3   lidocaine-prilocaine (EMLA) cream, Apply to affected area once, Disp: 30 g, Rfl: 3   lisinopril (ZESTRIL) 40 MG tablet, Take 1 tablet (40 mg total) by mouth daily., Disp: 30 tablet, Rfl: 3   Multiple Vitamins-Minerals (MULTIVITAMIN GUMMIES ADULTS PO), Take by mouth daily., Disp: , Rfl:    ondansetron (ZOFRAN) 8 MG tablet, Take 1 tablet (8 mg  total) by mouth every 8 (eight) hours as needed for nausea or vomiting., Disp: 60 tablet, Rfl: 2   prochlorperazine (COMPAZINE) 10 MG tablet, Take 1 tablet (10 mg total) by mouth every 6 (six) hours as needed for nausea or vomiting., Disp: 60 tablet, Rfl: 2   SYSTANE COMPLETE 0.6 % SOLN, Apply 1 drop to eye daily., Disp: , Rfl:    traMADol (ULTRAM) 50 MG tablet, TAKE 1 TABLET(50 MG) BY MOUTH DAILY, Disp: 30 tablet, Rfl: 3   ondansetron (ZOFRAN-ODT) 8 MG disintegrating tablet, Take 8 mg by mouth every 8 (eight) hours as needed. (Patient not taking: Reported on 08/16/2022), Disp: , Rfl:    rosuvastatin (CRESTOR) 10 MG tablet, Take 1 tablet (10 mg total) by mouth daily. (Patient not taking: Reported on 07/04/2022), Disp: 90 tablet, Rfl: 3  Physical exam:  Vitals:   08/16/22 0912  BP: (!) 165/86  Pulse: 69  Resp: 16  Temp: 98 F (36.7 C)  TempSrc: Oral  Weight: 149 lb 4.8 oz (67.7 kg)  Height: 5' 2.5" (1.588 m)   Physical Exam Constitutional:      General: She is not in acute distress. Cardiovascular:     Rate and Rhythm: Normal rate and regular rhythm.     Heart sounds: Normal heart sounds.  Pulmonary:     Effort: Pulmonary effort is normal.     Breath sounds: Normal breath sounds.  Abdominal:     General: Bowel sounds are normal.     Palpations: Abdomen is soft.  Skin:    General: Skin is warm and dry.  Neurological:     Mental Status: She is alert and oriented to person, place, and time.         Latest Ref Rng & Units 08/16/2022    8:18 AM  CMP  Glucose 70 - 99 mg/dL 115   BUN 8 - 23 mg/dL 12   Creatinine 0.44 - 1.00 mg/dL 0.79   Sodium 135 - 145 mmol/L 140   Potassium 3.5 - 5.1 mmol/L 3.8   Chloride 98 - 111 mmol/L 105   CO2 22 - 32 mmol/L 28   Calcium 8.9 - 10.3 mg/dL 9.2   Total Protein 6.5 - 8.1 g/dL 7.8   Total Bilirubin 0.3 - 1.2 mg/dL 0.7   Alkaline Phos 38 - 126 U/L 64   AST 15 - 41 U/L 30   ALT 0 - 44 U/L 38       Latest Ref Rng & Units 08/16/2022  8:18 AM  CBC  WBC 4.0 - 10.5 K/uL 3.2   Hemoglobin 12.0 - 15.0 g/dL 14.1   Hematocrit 36.0 - 46.0 % 42.6   Platelets 150 - 400 K/uL 154     No images are attached to the encounter.  CT CHEST ABDOMEN PELVIS W CONTRAST  Result Date: 08/08/2022 CLINICAL DATA:  History of endometrial cancer, follow-up. * Tracking Code: BO * EXAM: CT CHEST, ABDOMEN, AND PELVIS WITH CONTRAST TECHNIQUE: Multidetector CT imaging of the chest, abdomen and pelvis was performed following the standard protocol during bolus administration of intravenous contrast. RADIATION DOSE REDUCTION: This exam was performed according to the departmental dose-optimization program which includes automated exposure control, adjustment of the mA and/or kV according to patient size and/or use of iterative reconstruction technique. CONTRAST:  124m OMNIPAQUE IOHEXOL 300 MG/ML  SOLN COMPARISON:  Multiple priors including PET-CT June 04, 2022. FINDINGS: CT CHEST FINDINGS Cardiovascular: Right chest Port-A-Cath with tip at the superior cavoatrial junction. Aortic atherosclerosis. Normal caliber thoracic aorta. No central pulmonary embolus on this nondedicated study. Normal size heart. No significant pericardial effusion/thickening. Mediastinum/Nodes: Partially calcified right thyroid nodule measures 15 mm on image 7/2. No supraclavicular adenopathy. No pathologically enlarged mediastinal, hilar or axillary lymph nodes. Mild symmetric esophageal wall thickening. Lungs/Pleura: Scattered subsegmental atelectasis. No suspicious pulmonary nodules or masses. No pleural effusion. No pneumothorax. Musculoskeletal: Sclerotic focus in the T3 vertebral body is unchanged from PET-CT June 04, 2022 and was not hypermetabolic on that examination with Hounsfield units of 1,005 this is most consistent with a benign bone island. No aggressive lytic or blastic lesion of bone. Multilevel degenerative changes spine. CT ABDOMEN PELVIS FINDINGS Hepatobiliary: Stable  hypodense 3.1 cm lesion in the dome of the liver on image 43/2 previously characterized as a benign hepatic hemangioma on MRI August 08, 2016. No suspicious hepatic lesion. Mild wall thickening of a nondistended gallbladder which contains a 2 cm gallstone, suggestive of chronic inflammation. No biliary ductal dilation. Pancreas: No pancreatic ductal dilation or evidence of acute inflammation. Spleen: No splenomegaly or focal splenic lesion. Adrenals/Urinary Tract: Left adrenal nodule measures 9 mm on image 58/2, stable dating back to MRI abdomen dated August 08, 2016 consistent with a benign adenoma requiring no independent follow-up. Right adrenal gland appears normal. No hydronephrosis. Kidneys demonstrate symmetric enhancement and excretion of contrast material. Bilateral subcentimeter hypodense renal lesions are technically too small to accurately characterize but statistically likely reflect cysts and considered benign requiring no independent imaging follow-up. Urinary bladder is unremarkable for degree of distension. Stomach/Bowel: Radiopaque enteric contrast material traverses distal loops of small bowel. Stomach is unremarkable for degree of distension. No pathologic dilation of small or large bowel. The appendix and terminal ileum appear normal. No evidence of acute bowel inflammation. Vascular/Lymphatic: Normal caliber abdominal aorta. No pathologically enlarged abdominal or pelvic lymph nodes. Reproductive: Status post hysterectomy no suspicious nodularity along the vaginal cuff. No adnexal masses. Other: Decreased mild mesenteric and omental stranding without discrete nodularity. No significant abdominopelvic free fluid. Musculoskeletal: No aggressive lytic or blastic lesion of bone. Stable sclerotic focus in the left acetabulum which was not hypermetabolic on prior PET-CT and is consistent with a benign bone island. IMPRESSION: 1. Status post hysterectomy without evidence of local recurrence. 2. No  convincing evidence of metastatic disease within the chest, abdomen, or pelvis. 3. Decreased mild mesenteric and omental stranding without discrete nodularity or significant abdominopelvic free fluid. Continued attention on follow-up imaging suggested. 4. Partially calcified right thyroid nodule measuring 15 mm, this  was mildly hypermetabolic on prior PET-CT and warrants further evaluation with ultrasound and possible FNA. 5. Mild symmetric esophageal wall thickening, suggestive of esophagitis. 6. Cholelithiasis with mild wall thickening of a nondistended gallbladder, suggestive of chronic inflammation. Consider further evaluation with nuclear medicine HIDA scan with ejection fraction 7. Stable benign hepatic hemangioma and left adrenal adenoma. 8.  Aortic Atherosclerosis (ICD10-I70.0). Electronically Signed   By: Dahlia Bailiff M.D.   On: 08/08/2022 15:51     Assessment and plan- Patient is a 61 y.o. female with FIGO stage Ib endometrioid endometrial cancer pT1b N0 M0.  She is s/p 3 cycles of CarboTaxol chemotherapy and here to discuss CT scan results and further management  Interim scans after 3 cycles of chemotherapy does not show any evidence of recurrent or progressive disease.  She had a second opinion pathology sent to [facility] as well.  She was characterized as having T3/stage III disease.  Patient is seen today t by Dr. Donella Stade to discuss vaginal brachytherapy.  She will complete vaginal brachytherapy and I will plan for 3 more cycles of chemotherapy following that.  I will tentatively see her back on 09/18/2022 for cycle 4 of CarboTaxol chemotherapy on day 1 and Udenyca on day 3.  She also has an upcoming appointment with GYN oncology  Patient still has some ongoing chemo-induced neutropenia which will likely improve over the next few weeks   Visit Diagnosis 1. Endometrial carcinoma ([place])   2. Goals of care, counseling/discussion      Dr. Randa Evens, MD, MPH [facility] [phone] 08/16/2022 1:06 PM

## 2022-08-16 NOTE — Progress Notes (Signed)
Patient says has black spots on thumbs and big toes of feet

## 2022-08-16 NOTE — Progress Notes (Signed)
Radiation Oncology Follow up Note  Name: Jaime Johnson   Date:   08/16/2022 MRN:  [id number] DOB: 1961/06/19    This 61 y.o. female presents to the clinic today for follow-up for patient undergoing chemotherapy for stage III endometrial carcinoma status post robotic surgery at [facility].  Staging is endometrioid carcinoma stage III.  Tumor was FIGO grade 2.  REFERRING PROVIDER: Owens Loffler, MD  HPI: Patient is a 61 year old female originally consulted back in August for FIGO stage Ib overall grade.  Tumor invaded 14 of 17 mm and measured 5 cm in greatest dimension tumor was FIGO grade 2.  She has been undergoing treatment.  Review at [facility] showed this was a T3a lesion compatible with a stage IIIa disease she has been undergoing carbo Taxol for 3 cycles and is now referred to radiation oncology for consideration of vaginal brachytherapy.  She is tolerating her chemotherapy well.  She specifically [place] vaginal discharge any increased lower urinary tract symptoms or diarrhea.  She had a PET CT scan back in August no convincing evidence of hypermetabolic metastatic disease without suspicious hypermetabolic soft tissue nodularity along the vaginal cuff.  Recent CT scan of chest abdomen pelvis showed no evidence of local recurrence or convincing evidence for metastatic disease within the chest abdomen or pelvis.  COMPLICATIONS OF TREATMENT: none  FOLLOW UP COMPLIANCE: keeps appointments   PHYSICAL EXAM:  LMP 02/29/2020 (Approximate) Comment: Has had random spotting; hadn't had a period 1.5 yrs prior Well-developed well-nourished patient in NAD. HEENT reveals PERLA, EOMI, discs not visualized.  Oral cavity is clear. No oral mucosal lesions are identified. Neck is clear without evidence of cervical or supraclavicular adenopathy. Lungs are clear to A&P. Cardiac examination is essentially unremarkable with regular rate and rhythm without murmur rub or thrill. Abdomen is benign with no organomegaly or  masses noted. Motor sensory and DTR levels are equal and symmetric in the upper and lower extremities. Cranial nerves II through XII are grossly intact. Proprioception is intact. No peripheral adenopathy or edema is identified. No motor or sensory levels are noted. Crude visual fields are within normal range.  RADIOLOGY RESULTS: PET CT and CT scans reviewed compatible with above-stated findings  PLAN: At this time like to go ahead with vaginal brachytherapy would plan on delivering 23.5 Gray in 5 fractions using high-dose-rate remote afterloading.  Risks and benefits of treatment including possible increased lower urinary tract symptoms diarrhea slight vaginal stenosis all were described in detail to the patient.  She seems to comprehend her treatment plan well.  I have set her up for simulation next week.  I would like to take this opportunity to thank you for allowing me to participate in the care of your patient.Noreene Filbert, MD

## 2022-08-17 ENCOUNTER — Other Ambulatory Visit: Payer: Self-pay

## 2022-08-18 ENCOUNTER — Other Ambulatory Visit: Payer: Self-pay

## 2022-08-18 LAB — CA 125: Cancer Antigen (CA) 125: 4.8 U/mL (ref 0.0–38.1)

## 2022-08-21 ENCOUNTER — Ambulatory Visit
Admission: RE | Admit: 2022-08-21 | Discharge: 2022-08-21 | Disposition: A | Discharge status: Home / Self Care | Payer: Self-pay | Source: Ambulatory Visit | Attending: Radiation Oncology | Admitting: Radiation Oncology

## 2022-08-22 ENCOUNTER — Inpatient Hospital Stay: Payer: Self-pay | Attending: Obstetrics and Gynecology | Admitting: Obstetrics and Gynecology

## 2022-08-22 ENCOUNTER — Encounter: Payer: Self-pay | Admitting: Obstetrics and Gynecology

## 2022-08-22 ENCOUNTER — Other Ambulatory Visit: Payer: Self-pay | Admitting: Oncology

## 2022-08-22 VITALS — BP 141/83 | HR 73 | Temp 96.6°F | Resp 20 | Wt 148.9 lb

## 2022-08-22 DIAGNOSIS — M4802 Spinal stenosis, cervical region: Secondary | ICD-10-CM | POA: Insufficient documentation

## 2022-08-22 DIAGNOSIS — Z90722 Acquired absence of ovaries, bilateral: Secondary | ICD-10-CM | POA: Insufficient documentation

## 2022-08-22 DIAGNOSIS — C541 Malignant neoplasm of endometrium: Secondary | ICD-10-CM

## 2022-08-22 DIAGNOSIS — Z7189 Other specified counseling: Secondary | ICD-10-CM

## 2022-08-22 DIAGNOSIS — C189 Malignant neoplasm of colon, unspecified: Secondary | ICD-10-CM | POA: Insufficient documentation

## 2022-08-22 DIAGNOSIS — M069 Rheumatoid arthritis, unspecified: Secondary | ICD-10-CM | POA: Insufficient documentation

## 2022-08-22 DIAGNOSIS — Z79899 Other long term (current) drug therapy: Secondary | ICD-10-CM | POA: Insufficient documentation

## 2022-08-22 DIAGNOSIS — Z90710 Acquired absence of both cervix and uterus: Secondary | ICD-10-CM | POA: Insufficient documentation

## 2022-08-22 DIAGNOSIS — Z87891 Personal history of nicotine dependence: Secondary | ICD-10-CM | POA: Insufficient documentation

## 2022-08-22 DIAGNOSIS — C7962 Secondary malignant neoplasm of left ovary: Secondary | ICD-10-CM | POA: Insufficient documentation

## 2022-08-22 DIAGNOSIS — D709 Neutropenia, unspecified: Secondary | ICD-10-CM | POA: Insufficient documentation

## 2022-08-22 DIAGNOSIS — J301 Allergic rhinitis due to pollen: Secondary | ICD-10-CM | POA: Insufficient documentation

## 2022-08-22 DIAGNOSIS — E785 Hyperlipidemia, unspecified: Secondary | ICD-10-CM | POA: Insufficient documentation

## 2022-08-22 DIAGNOSIS — Z5111 Encounter for antineoplastic chemotherapy: Secondary | ICD-10-CM | POA: Insufficient documentation

## 2022-08-22 DIAGNOSIS — Z5189 Encounter for other specified aftercare: Secondary | ICD-10-CM | POA: Insufficient documentation

## 2022-08-22 DIAGNOSIS — C7982 Secondary malignant neoplasm of genital organs: Secondary | ICD-10-CM | POA: Insufficient documentation

## 2022-08-22 DIAGNOSIS — I10 Essential (primary) hypertension: Secondary | ICD-10-CM | POA: Insufficient documentation

## 2022-08-22 DIAGNOSIS — G620 Drug-induced polyneuropathy: Secondary | ICD-10-CM | POA: Insufficient documentation

## 2022-08-22 DIAGNOSIS — E041 Nontoxic single thyroid nodule: Secondary | ICD-10-CM | POA: Insufficient documentation

## 2022-08-22 NOTE — Progress Notes (Signed)
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 G74P1 female who is seen in consultation from Dr. Janese Banks for endometrial cancer s/p RA-TLH-BSO with SLND and perineal repair at [facility] on 03/31/22 who presents as new patient.   She presented as 61 year old female with PMB to gyn. Biopsy showed endometrial cancer and was referred to Dr. Claiborne Billings from [facility]. She underwent RA-TLH-BSo on 03/30/2022.   Final Pathologic Diagnosis  A. SENTINEL LYMPH NODE, RIGHT EXTERNAL ILIAC ARTERY, RESECTION: Two lymph nodes, negative for metastatic carcinoma (0/2).    B. SENTINEL LYMPH NODE, LEFT EXTERNAL ILIAC ARTERY, RESECTION: One lymph node, negative for metastatic carcinoma (0/1).    C. UTERUS, FALLOPIAN TUBE AND OVARIES, HYSTERECTOMY WITH BILATERAL SALPINGO-OOPHORECTOMY: Endometrioid adenocarcinoma, FIGO grade 2. Tumor measures 5 cm in greatest dimension.  Tumor invades 14 of 17 mm thick myometrium (greater than 50%).  Margins of resection are uninvolved by tumor.  Tumor involves lower uterine segment.  Cervix is uninvolved by tumor.  Benign right fallopian tube and ovary.  Left ovary with tumor located within lymphovascular spaces, no tissue invasion identified.  Left fallopian tube with tumor within lymphovascular spaces, no tissue invasion identified. Free floating tumor located inside left fallopian tube.  pT1b pN0   Surgery was complicated by vaginal laceration/episiotomy. During the repair, the tip of the needle broke off. Xray was performed and confirmed 'curvilinear suture needle projects over the left labial tissue/left perineum'. Needle was located using fluoroscopy and was removed in its entirety and incision was then repaired. Surgery was prolonged due to this with estimated time of ~ 7 hours.   Post op course was then complicated by  fever requiring hospitalization for another week.   Dr. Claiborne Billings saw patient, recommended adjuvant chemotherapy with Carbo Taxol for 3 cycles followed by vaginal brachytherapy.  Patient transferred care to [facility] and has been followed by Dr. Janese Banks with medical oncology. 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 She is not a diabetic. She has baseline rheumatoid arthritis   Pathology was also second for a second opinion to [facility] as well.  Path report suggested that this was a T3a tumor FIGO stage IIIa disease. Left ovary and fallopian tube: Positive for carcinoma, lymphovascular involvement. Microcystic elongated fragmented (MELF) pattern of invasion and associated lymphovascular invasion.    1. Right external iliac artery sentinel lymph node, lymphadenectomy:   Two lymph nodes, negative for malignancy (0/2).   2.  Left external iliac artery, sentinel lymph node, lymphadenectomy:   One lymph node, suspicious for isolated tumor cells, see comment.   Comment: The paraffin block was requested for keratin immunohistochemistry, but the referring institution declined the request.  Since we were unable to obtain material for immunohistochemistry, this node will be classified as negative for synoptic reporting purposes and staging.     MLH1: Retained expression (wild-type). PMS2: Retained expression (wild-type). MSH2: Retained expression (wild-type). MSH6: Retained expression (wild-type).  P53 immunohistochemistry:   Wild type pattern (stain performed at [facility] on outside block C5).   s/p 3 cycles of CarboTaxol chemotherapy seen by Dr. Janese Banks. She requires G-CSF for leukopenia.  [facility] Tumor [facility] recommended completion of 6 cycles of carboplatin/paclitaxel followed by repeat PET versus CT scan to assess status of vaginal nodularity. If nodularity present and concerning for persistent disease (FDG avid or biopsy-proven) could either treat with  EBRT + VBT or surgical resection followed by VBT. Imaging  findings could be postoperative in nature given proximity to surgery.   Dr. Donella Stade to discuss vaginal brachytherapy.  He has planned complete vaginal brachytherapy.   She last saw Dr. Janese Banks and Dr. Baruch Gouty on 08/16/2022.  Pap- 06/30/2009- NILM. HPV not performed 02/25/2007- ASCUS  Today, she complains of some vulvar dryness. She feels fatigued with chemotherapy but slowly improving.  She is interested in sexual intercourse.    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   Hyperlipidemia LDL goal <100 02/16/2020   Cervical radicular pain (RIGHT) 09/29/2019   Bilateral carpal tunnel syndrome 09/29/2019   Essential hypertension 12/19/2016   Allergic rhinitis due to pollen     Past Medical History: Past Medical History:  Diagnosis Date   Allergic rhinitis due to pollen    Endometrial cancer ([place])    Hyperlipidemia LDL goal <100 02/16/2020   Hypertension 12/19/2016    Past Surgical History: Past Surgical History:  Procedure Laterality Date   CESAREAN SECTION     COLONOSCOPY WITH PROPOFOL N/A 11/12/2016   Procedure: COLONOSCOPY WITH PROPOFOL;  Surgeon: Lucilla Lame, MD;  Location: [place];  Service: Endoscopy;  Laterality: N/A;   COLONOSCOPY WITH PROPOFOL N/A 03/29/2017   Procedure: COLONOSCOPY WITH PROPOFOL;  Surgeon: Lucilla Lame, MD;  Location: [place];  Service: Endoscopy;  Laterality: N/A;   COLONOSCOPY WITH PROPOFOL N/A 03/14/2020   Procedure: COLONOSCOPY WITH PROPOFOL;  Surgeon: Lucilla Lame, MD;  Location: [place];  Service: Endoscopy;  Laterality: N/A;  priority 4   IR IMAGING GUIDED PORT INSERTION  06/01/2022   POLYPECTOMY  11/12/2016   Procedure: POLYPECTOMY;  Surgeon: Lucilla Lame, MD;  Location: [place];  Service: Endoscopy;;   POLYPECTOMY  03/29/2017   Procedure: POLYPECTOMY;   Surgeon: Lucilla Lame, MD;  Location: [place];  Service: Endoscopy;;   Past Gynecologic History:  Menarche: age 44 Last pap: per hpi Post menopausal Sexually active.   OB History:  OB History  Gravida Para Term Preterm AB Living  _0 SAB IAB Ectopic Multiple Live Births               # Outcome Date GA Lbr Len/2nd Weight Sex Delivery Anes PTL Lv  1 Term             Family History: Family History  Problem Relation Age of Onset   Hepatitis C Mother 48       d/c at 62   Hypertension Father    Hepatitis C Brother    Breast cancer Neg Hx     Social History: Social History   Socioeconomic History   Marital status: Married    Spouse name: Not on file   Number of children: Not on file   Years of education: Not on file   Highest education level: Not on file  Occupational History   Not on file  Tobacco Use   Smoking status: Former    Types: Cigarettes    Quit date: 10/22/1978    Years since quitting: 43.8   Smokeless tobacco: Never  Vaping Use   Vaping Use: Never used  Substance and Sexual Activity   Alcohol use: No   Drug use: No   Sexual activity: Yes    Partners:  Male  Other Topics Concern   Not on file  Social History Narrative   Not on file   Social Determinants of Health   Financial Resource Strain: Low Risk  (06/22/2022)   Overall Financial Resource Strain (CARDIA)    Difficulty of Paying Living Expenses: Not very hard  Food Insecurity: No Food Insecurity (06/22/2022)   Hunger Vital Sign    Worried About Running Out of Food in the Last Year: Never true    Ran Out of Food in the Last Year: Never true  Transportation Needs: No Transportation Needs (06/22/2022)   PRAPARE - Hydrologist (Medical): No    Lack of Transportation (Non-Medical): No  Physical Activity: Inactive (06/22/2022)   Exercise Vital Sign    Days of Exercise per Week: 0 days    Minutes of Exercise per Session: 0 min  Stress: Stress Concern  Present (06/22/2022)   [place]    Feeling of Stress : To some extent  Social Connections: Socially Integrated (06/22/2022)   Social Connection and Isolation Panel [NHANES]    Frequency of Communication with Friends and Family: Three times a week    Frequency of Social Gatherings with Friends and Family: Three times a week    Attends Religious Services: 1 to 4 times per year    Active Member of Clubs or Organizations: No    Attends Archivist Meetings: 1 to 4 times per year    Marital Status: Married  Human resources officer Violence: Not At Risk (06/22/2022)   Humiliation, Afraid, Rape, and Kick questionnaire    Fear of Current or Ex-Partner: No    Emotionally Abused: No    Physically Abused: No    Sexually Abused: No    Allergies: Allergies  Allergen Reactions   Azithromycin Other (See Comments)    Gets a bad yeast infection from this.    Current Medications: Current Outpatient Medications  Medication Sig Dispense Refill   amLODipine (NORVASC) 5 MG tablet Take 1 tablet (5 mg total) by mouth daily. 30 tablet 3   diazepam (VALIUM) 5 MG tablet TAKE 1 TABLET(5 MG) BY MOUTH EVERY 6 HOURS AS NEEDED FOR ANXIETY 30 tablet 3   lidocaine-prilocaine (EMLA) cream Apply to affected area once 30 g 3   lisinopril (ZESTRIL) 40 MG tablet Take 1 tablet (40 mg total) by mouth daily. 30 tablet 3   Multiple Vitamins-Minerals (MULTIVITAMIN GUMMIES ADULTS PO) Take by mouth daily.     ondansetron (ZOFRAN) 8 MG tablet Take 1 tablet (8 mg total) by mouth every 8 (eight) hours as needed for nausea or vomiting. 60 tablet 2   prochlorperazine (COMPAZINE) 10 MG tablet Take 1 tablet (10 mg total) by mouth every 6 (six) hours as needed for nausea or vomiting. 60 tablet 2   SYSTANE COMPLETE 0.6 % SOLN Apply 1 drop to eye daily.     traMADol (ULTRAM) 50 MG tablet TAKE 1 TABLET(50 MG) BY MOUTH DAILY 30 tablet 3   ondansetron (ZOFRAN-ODT) 8 MG  disintegrating tablet Take 8 mg by mouth every 8 (eight) hours as needed. (Patient not taking: Reported on 08/16/2022)     rosuvastatin (CRESTOR) 10 MG tablet Take 1 tablet (10 mg total) by mouth daily. (Patient not taking: Reported on 07/04/2022) 90 tablet 3   No current facility-administered medications for this visit.   Review of systems General: negative for fevers, changes in weight or night sweats Skin: negative for changes  in moles or sores or rash Eyes: negative for changes in vision HEENT: negative for change in hearing, voice changes positive for tinnitus Pulmonary: negative for dyspnea, orthopnea, productive cough, wheezing Cardiac: negative for palpitations, pain Gastrointestinal: negative for nausea, vomiting, diarrhea, hematemesis, hematochezia. Positive for constipation and bleeding Genitourinary/Sexual: negative for dysuria, retention, hematuria, incontinence Ob/Gyn:  negative for abnormal bleeding, or pain Musculoskeletal: positive for pain in legs, joints, back Hematology: negative for easy bruising, abnormal bleeding Neurologic/Psych: negative for headaches, seizures, paralysis, weakness. Positive for numbness (chronic)  Objective:  Physical Examination:  BP (!) 141/83   Pulse 73   Temp (!) 96.6 F (35.9 C)   Resp 20   Wt 148 lb 14.4 oz (67.5 kg)   LMP 02/29/2020 (Approximate) Comment: Has had random spotting; hadn't had a period 1.5 yrs prior  SpO2 100%   BMI 26.80 kg/m    ECOG Performance Status: 1 - Symptomatic but completely ambulatory  GENERAL: Patient is a well appearing female in no acute distress. Alopecia HEENT:  PERRL, neck supple with midline trachea. NODES:  No cervical, supraclavicular, axillary, or inguinal lymphadenopathy palpated.  LUNGS:  Clear to auscultation bilaterally.  No wheezes or rhonchi. HEART:  Regular rate and rhythm. No murmur appreciated. ABDOMEN:  Soft. Not distended. Well healed surgical scars. Mild tenderness with deep  palpation diffusely.  MSK:  No focal spinal tenderness to palpation. Full range of motion bilaterally in the upper extremities. EXTREMITIES:  No peripheral edema.   SKIN:  Clear with no obvious rashes or skin changes. NEURO:  Nonfocal. Well oriented.  Appropriate affect.  Pelvic: Chaperoned by CMA EGBUS: no lesions.  Episiotomy site well healed Cervix: absent Vagina: no lesions, no discharge or bleeding.  The vaginal cuff is healed Uterus: absent BME: no palpable masses Rectovaginal: deferred   Lab Review N/A  Radiologic Imaging: As noted in history of present illness  CT CHEST ABDOMEN PELVIS W CONTRAST CLINICAL DATA:  History of endometrial cancer, follow-up. * Tracking Code: BO *  EXAM: CT CHEST, ABDOMEN, AND PELVIS WITH CONTRAST  TECHNIQUE: Multidetector CT imaging of the chest, abdomen and pelvis was performed following the standard protocol during bolus administration of intravenous contrast.  RADIATION DOSE REDUCTION: This exam was performed according to the departmental dose-optimization program which includes automated exposure control, adjustment of the mA and/or kV according to patient size and/or use of iterative reconstruction technique.  CONTRAST:  110m OMNIPAQUE IOHEXOL 300 MG/ML  SOLN  COMPARISON:  Multiple priors including PET-CT June 04, 2022.  FINDINGS: CT CHEST FINDINGS  Cardiovascular: Right chest Port-A-Cath with tip at the superior cavoatrial junction. Aortic atherosclerosis. Normal caliber thoracic aorta. No central pulmonary embolus on this nondedicated study. Normal size heart. No significant pericardial effusion/thickening.  Mediastinum/Nodes: Partially calcified right thyroid nodule measures 15 mm on image 7/2. No supraclavicular adenopathy. No pathologically enlarged mediastinal, hilar or axillary lymph nodes. Mild symmetric esophageal wall thickening.  Lungs/Pleura: Scattered subsegmental atelectasis. No suspicious pulmonary  nodules or masses. No pleural effusion. No pneumothorax.  Musculoskeletal: Sclerotic focus in the T3 vertebral body is unchanged from PET-CT June 04, 2022 and was not hypermetabolic on that examination with Hounsfield units of 1,005 this is most consistent with a benign bone island. No aggressive lytic or blastic lesion of bone. Multilevel degenerative changes spine.  CT ABDOMEN PELVIS FINDINGS  Hepatobiliary: Stable hypodense 3.1 cm lesion in the dome of the liver on image 43/2 previously characterized as a benign hepatic hemangioma on MRI August 08, 2016. No suspicious hepatic  lesion. Mild wall thickening of a nondistended gallbladder which contains a 2 cm gallstone, suggestive of chronic inflammation. No biliary ductal dilation.  Pancreas: No pancreatic ductal dilation or evidence of acute inflammation.  Spleen: No splenomegaly or focal splenic lesion.  Adrenals/Urinary Tract: Left adrenal nodule measures 9 mm on image 58/2, stable dating back to MRI abdomen dated August 08, 2016 consistent with a benign adenoma requiring no independent follow-up. Right adrenal gland appears normal. No hydronephrosis. Kidneys demonstrate symmetric enhancement and excretion of contrast material. Bilateral subcentimeter hypodense renal lesions are technically too small to accurately characterize but statistically likely reflect cysts and considered benign requiring no independent imaging follow-up. Urinary bladder is unremarkable for degree of distension.  Stomach/Bowel: Radiopaque enteric contrast material traverses distal loops of small bowel. Stomach is unremarkable for degree of distension. No pathologic dilation of small or large bowel. The appendix and terminal ileum appear normal. No evidence of acute bowel inflammation.  Vascular/Lymphatic: Normal caliber abdominal aorta. No pathologically enlarged abdominal or pelvic lymph nodes.  Reproductive: Status post hysterectomy no  suspicious nodularity along the vaginal cuff. No adnexal masses.  Other: Decreased mild mesenteric and omental stranding without discrete nodularity. No significant abdominopelvic free fluid.  Musculoskeletal: No aggressive lytic or blastic lesion of bone. Stable sclerotic focus in the left acetabulum which was not hypermetabolic on prior PET-CT and is consistent with a benign bone island.  IMPRESSION: 1. Status post hysterectomy without evidence of local recurrence. 2. No convincing evidence of metastatic disease within the chest, abdomen, or pelvis. 3. Decreased mild mesenteric and omental stranding without discrete nodularity or significant abdominopelvic free fluid. Continued attention on follow-up imaging suggested. 4. Partially calcified right thyroid nodule measuring 15 mm, this was mildly hypermetabolic on prior PET-CT and warrants further evaluation with ultrasound and possible FNA. 5. Mild symmetric esophageal wall thickening, suggestive of esophagitis. 6. Cholelithiasis with mild wall thickening of a nondistended gallbladder, suggestive of chronic inflammation. Consider further evaluation with nuclear medicine HIDA scan with ejection fraction 7. Stable benign hepatic hemangioma and left adrenal adenoma. 8.  Aortic Atherosclerosis (ICD10-I70.0).  Electronically Signed   By: Dahlia Bailiff M.D.   On: 08/08/2022 15:51      Assessment:  AZARIE CORIZ is a 61 y.o. female diagnosed with Stage IIIA, grade 2 endometrioid endometrial cancer (pMMR; p53wt) with Left external iliac artery, SLN, suspicious for isolated tumor cells. She is s/p 3 cycles of CarboTaxol chemotherapy, follow up CT scan negative.   Elevated blood pressure with a known history of hypertension.  Currently asymptomatic  Partially calcified right thyroid nodule measuring was mildly hypermetabolic on prior PET-CT, uncertain etiology.    Medical co-morbidities complicating care: HTN and prior abdominal  surgery. Body mass index is 26.8 kg/m.   Plan:   Problem List Items Addressed This Visit       Genitourinary   Endometrial carcinoma ([place]) - Primary (Chronic)   Other Visit Diagnoses     Counseling and coordination of care           We discussed options for management including either sandwich therapy versus all cycles of chemotherapy upfront.  We reviewed the [facility] Tumor Board recommended completion of 6 cycles of carboplatin/paclitaxel followed by repeat PET versus CT scan to assess status of vaginal nodularity.  Today's exam her vaginal findings were reassuring.  I suspect that the prior PET imaging that was FDG avid in this area is most likely due to the healing process.  I would recommend a repeat PET  after the 3 additional cycles of treatment  If nodularity present and concerning for persistent disease (FDG avid or biopsy-proven) could either treat with EBRT + VBT or surgical resection followed by VBT. Imaging findings could be postoperative in nature given proximity to surgery.   Partially calcified right thyroid nodule measuring was mildly hypermetabolic on prior PET-CT, uncertain etiology.  Radiology recommended  further evaluation with ultrasound and possible FNA.  Defer to Dr. Janese Banks regarding management.  I provided counseling regarding sexual function and activity.  Is been over 3 months since her surgery.  Given her exam findings I think it is okay to proceed with sexual intercourse.  She states that someone told her to wait 6 months.  But that is not our routine practice.  We did discuss the risk of vaginal cuff dehiscence and that if that occurs we typically need to repair the area in the operating room.  However this risk is very low  Suggested return to clinic in  3 months.    The patient's diagnosis, an outline of the further diagnostic and laboratory studies which will be required, the recommendation, and alternatives were discussed.  All questions were answered to the  patient's satisfaction.  A total of 120 minutes were spent with the patient/family today; >50% was spent in education, counseling and coordination of care for endometrial cancer.    Sylvester Minton Gaetana Michaelis, MD

## 2022-08-23 ENCOUNTER — Other Ambulatory Visit: Payer: Self-pay

## 2022-08-24 MED FILL — Dexamethasone Sodium Phosphate Inj 100 MG/10ML: INTRAMUSCULAR | Qty: 1 | Status: AC

## 2022-08-27 ENCOUNTER — Inpatient Hospital Stay: Payer: Self-pay

## 2022-08-27 ENCOUNTER — Other Ambulatory Visit: Payer: Self-pay | Admitting: *Deleted

## 2022-08-27 ENCOUNTER — Inpatient Hospital Stay (HOSPITAL_BASED_OUTPATIENT_CLINIC_OR_DEPARTMENT_OTHER): Payer: Self-pay | Admitting: Oncology

## 2022-08-27 ENCOUNTER — Encounter: Payer: Self-pay | Admitting: Oncology

## 2022-08-27 ENCOUNTER — Other Ambulatory Visit: Payer: Self-pay | Admitting: Family Medicine

## 2022-08-27 VITALS — BP 138/84 | HR 67 | Temp 97.9°F | Resp 16 | Ht 62.5 in | Wt 142.6 lb

## 2022-08-27 DIAGNOSIS — Z5111 Encounter for antineoplastic chemotherapy: Secondary | ICD-10-CM

## 2022-08-27 DIAGNOSIS — C541 Malignant neoplasm of endometrium: Secondary | ICD-10-CM

## 2022-08-27 DIAGNOSIS — T451X5A Adverse effect of antineoplastic and immunosuppressive drugs, initial encounter: Secondary | ICD-10-CM

## 2022-08-27 DIAGNOSIS — D701 Agranulocytosis secondary to cancer chemotherapy: Secondary | ICD-10-CM

## 2022-08-27 LAB — CBC WITH DIFFERENTIAL/PLATELET
Abs Immature Granulocytes: 0 10*3/uL (ref 0.00–0.07)
Basophils Absolute: 0 10*3/uL (ref 0.0–0.1)
Basophils Relative: 1 %
Eosinophils Absolute: 0 10*3/uL (ref 0.0–0.5)
Eosinophils Relative: 1 %
HCT: 38.6 % (ref 36.0–46.0)
Hemoglobin: 12.7 g/dL (ref 12.0–15.0)
Immature Granulocytes: 0 %
Lymphocytes Relative: 37 %
Lymphs Abs: 1.3 10*3/uL (ref 0.7–4.0)
MCH: 27.5 pg (ref 26.0–34.0)
MCHC: 32.9 g/dL (ref 30.0–36.0)
MCV: 83.5 fL (ref 80.0–100.0)
Monocytes Absolute: 0.4 10*3/uL (ref 0.1–1.0)
Monocytes Relative: 10 %
Neutro Abs: 1.7 10*3/uL (ref 1.7–7.7)
Neutrophils Relative %: 51 %
Platelets: 138 10*3/uL — ABNORMAL LOW (ref 150–400)
RBC: 4.62 MIL/uL (ref 3.87–5.11)
RDW: 14.7 % (ref 11.5–15.5)
WBC: 3.4 10*3/uL — ABNORMAL LOW (ref 4.0–10.5)
nRBC: 0 % (ref 0.0–0.2)

## 2022-08-27 LAB — COMPREHENSIVE METABOLIC PANEL
ALT: 28 U/L (ref 0–44)
AST: 27 U/L (ref 15–41)
Albumin: 4.5 g/dL (ref 3.5–5.0)
Alkaline Phosphatase: 60 U/L (ref 38–126)
Anion gap: 7 (ref 5–15)
BUN: 14 mg/dL (ref 8–23)
CO2: 25 mmol/L (ref 22–32)
Calcium: 9.2 mg/dL (ref 8.9–10.3)
Chloride: 108 mmol/L (ref 98–111)
Creatinine, Ser: 0.62 mg/dL (ref 0.44–1.00)
GFR, Estimated: >60 mL/min (ref >60)
Glucose, Bld: 124 mg/dL — ABNORMAL HIGH (ref 70–99)
Potassium: 3.4 mmol/L — ABNORMAL LOW (ref 3.5–5.1)
Sodium: 140 mmol/L (ref 135–145)
Total Bilirubin: 0.6 mg/dL (ref 0.3–1.2)
Total Protein: 7.2 g/dL (ref 6.5–8.1)

## 2022-08-27 MED ORDER — SODIUM CHLORIDE 0.9 % IV SOLN
Freq: Once | INTRAVENOUS | Status: AC
  Filled 2022-08-27: qty 250

## 2022-08-27 MED ORDER — DIPHENHYDRAMINE HCL 50 MG/ML IJ SOLN
25.0000 mg | Freq: Once | INTRAMUSCULAR | Status: AC
  Administered 2022-08-27: 25 mg via INTRAVENOUS
  Filled 2022-08-27: qty 1

## 2022-08-27 MED ORDER — FAMOTIDINE IN NACL 20-0.9 MG/50ML-% IV SOLN
20.0000 mg | Freq: Once | INTRAVENOUS | Status: AC
  Administered 2022-08-27: 20 mg via INTRAVENOUS
  Filled 2022-08-27: qty 50

## 2022-08-27 MED ORDER — SODIUM CHLORIDE 0.9 % IV SOLN
175.0000 mg/m2 | Freq: Once | INTRAVENOUS | Status: AC
  Administered 2022-08-27: 294 mg via INTRAVENOUS
  Filled 2022-08-27: qty 49

## 2022-08-27 MED ORDER — HEPARIN SOD (PORK) LOCK FLUSH 100 UNIT/ML IV SOLN
500.0000 [IU] | Freq: Once | INTRAVENOUS | Status: AC | PRN
  Administered 2022-08-27: 500 [IU]
  Filled 2022-08-27: qty 5

## 2022-08-27 MED ORDER — SODIUM CHLORIDE 0.9 % IV SOLN
498.5000 mg | Freq: Once | INTRAVENOUS | Status: AC
  Administered 2022-08-27: 500 mg via INTRAVENOUS
  Filled 2022-08-27: qty 50

## 2022-08-27 MED ORDER — OXYCODONE HCL 5 MG PO TABS: 5.0000 mg | ORAL_TABLET | Freq: Three times a day (TID) | ORAL | 0 refills | Status: DC

## 2022-08-27 MED ORDER — SODIUM CHLORIDE 0.9 % IV SOLN
150.0000 mg | Freq: Once | INTRAVENOUS | Status: AC
  Administered 2022-08-27: 150 mg via INTRAVENOUS
  Filled 2022-08-27: qty 5

## 2022-08-27 MED ORDER — PALONOSETRON HCL INJECTION 0.25 MG/5ML
0.2500 mg | Freq: Once | INTRAVENOUS | Status: AC
  Administered 2022-08-27: 0.25 mg via INTRAVENOUS
  Filled 2022-08-27: qty 5

## 2022-08-27 MED ORDER — SODIUM CHLORIDE 0.9 % IV SOLN
10.0000 mg | Freq: Once | INTRAVENOUS | Status: AC
  Administered 2022-08-27: 10 mg via INTRAVENOUS
  Filled 2022-08-27: qty 10

## 2022-08-27 NOTE — Telephone Encounter (Signed)
Last office visit 06/27/22 for HTN.  Last refilled 01/29/22 for #30 with 3 refills.  No future appointments with PCP.

## 2022-08-27 NOTE — Progress Notes (Signed)
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 Claiborne Billings from GYN oncology at [facility].  She underwent robotic surgery on 03/30/2022.  Final pathology showed:    Final Pathologic Diagnosis  A. SENTINEL LYMPH NODE, RIGHT EXTERNAL ILIAC ARTERY, RESECTION: Two lymph nodes, negative for metastatic carcinoma (0/2).    B. SENTINEL LYMPH NODE, LEFT EXTERNAL ILIAC ARTERY, RESECTION: One lymph node, negative for metastatic carcinoma (0/1).    C. UTERUS, FALLOPIAN TUBE AND OVARIES, HYSTERECTOMY WITH BILATERAL SALPINGO-OOPHORECTOMY: Endometrioid adenocarcinoma, FIGO grade 2. Tumor measures 5 cm in greatest dimension.  Tumor invades 14 of 17 mm thick myometrium (greater than 50%).  Margins of resection are uninvolved by tumor.  Tumor involves lower uterine segment.  Cervix is uninvolved by tumor.  Benign right fallopian tube and ovary.  Left ovary with tumor located within lymphovascular spaces, no tissue invasion identified.  Left fallopian tube with tumor within lymphovascular spaces, no tissue invasion identified. Free floating tumor located inside left fallopian tube.  pT1b pN0      She was recommended adjuvant chemotherapy with Norma Fredrickson Taxol for 3 cycles by Dr. Claiborne Billings followed  by vaginal brachytherapy.  Patient states that she does not wish to travel all the way to [facility] given her ongoing fatigue as well as discomfort from episiotomy and would prefer to get treatment locally.  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  She is not a diabetic.  She has baseline rheumatoid arthritis   Pathology was also second for a second opinion to [facility] as well.  Path report over there suggested that this was a T3a tumor FIGO stage IIIa disease    Interval history-patient has baseline fatigue but is overall doing well.  Neuropathy is stable at her baseline.  Denies any abdominal pain.  She does report pain after getting Neulasta injections which is not well controlled by tramadol.  She took some leftover oxycodone last time which helped her but is out of it presently.  ECOG PS- 1 Pain scale- 0 Opioid associated constipation- no  Review of systems- Review of Systems  Constitutional:  Positive for malaise/fatigue. Negative for chills, fever and weight loss.  HENT:  Negative for congestion, ear discharge and nosebleeds.   Eyes:  Negative for blurred vision.  Respiratory:  Negative for cough, hemoptysis, sputum production, shortness of breath and wheezing.   Cardiovascular:  Negative for chest pain, palpitations, orthopnea and claudication.  Gastrointestinal:  Negative for abdominal pain, blood in stool, constipation, diarrhea, heartburn, melena, nausea and vomiting.  Genitourinary:  Negative for dysuria, flank pain, frequency, hematuria and urgency.  Musculoskeletal:  Negative for back pain, joint pain and myalgias.  Skin:  Negative for rash.  Neurological:  Positive for sensory change (Peripheral neuropathy). Negative for dizziness, tingling, focal weakness, seizures, weakness and headaches.  Endo/Heme/Allergies:  Does not bruise/bleed easily.  Psychiatric/Behavioral:  Negative for depression and suicidal ideas. The patient does not have insomnia.       Allergies   Allergen Reactions   Azithromycin Other (See Comments)    Gets a bad yeast infection from this.     Past Medical History:  Diagnosis Date   Allergic rhinitis due to pollen    Endometrial cancer ([place])    Hyperlipidemia LDL goal <100 02/16/2020   Hypertension 12/19/2016     Past Surgical History:  Procedure Laterality Date   CESAREAN SECTION     COLONOSCOPY WITH PROPOFOL N/A 11/12/2016   Procedure: COLONOSCOPY WITH PROPOFOL;  Surgeon: Lucilla Lame, MD;  Location: [place];  Service: Endoscopy;  Laterality: N/A;   COLONOSCOPY WITH PROPOFOL N/A 03/29/2017   Procedure: COLONOSCOPY WITH PROPOFOL;  Surgeon: Lucilla Lame, MD;  Location: [place];  Service: Endoscopy;  Laterality: N/A;   COLONOSCOPY WITH PROPOFOL N/A 03/14/2020   Procedure: COLONOSCOPY WITH PROPOFOL;  Surgeon: Lucilla Lame, MD;  Location: [place];  Service: Endoscopy;  Laterality: N/A;  priority 4   IR IMAGING GUIDED PORT INSERTION  06/01/2022   POLYPECTOMY  11/12/2016   Procedure: POLYPECTOMY;  Surgeon: Lucilla Lame, MD;  Location: [place];  Service: Endoscopy;;   POLYPECTOMY  03/29/2017   Procedure: POLYPECTOMY;  Surgeon: Lucilla Lame, MD;  Location: [facility];  Service: Endoscopy;;    Social History   Socioeconomic History   Marital status: Married    Spouse name: Not on file   Number of children: Not on file   Years of education: Not on file   Highest education level: Not on file  Occupational History   Not on file  Tobacco Use   Smoking status: Former    Types: Cigarettes    Quit date: 10/22/1978    Years since quitting: 43.8   Smokeless tobacco: Never  Vaping Use   Vaping Use: Never used  Substance and Sexual Activity   Alcohol use: No   Drug use: No   Sexual activity: Yes    Partners: Male  Other Topics Concern   Not on file  Social History Narrative   Not on file   Social Determinants of Health   Financial Resource Strain: Low Risk  (06/22/2022)    Overall Financial Resource Strain (CARDIA)    Difficulty of Paying Living Expenses: Not very hard  Food Insecurity: No Food Insecurity (06/22/2022)   Hunger Vital Sign    Worried About Running Out of Food in the Last Year: Never true    Ran Out of Food in the Last Year: Never true  Transportation Needs: No Transportation Needs (06/22/2022)   PRAPARE - Hydrologist (Medical): No    Lack of Transportation (Non-Medical): No  Physical Activity: Inactive (06/22/2022)   Exercise Vital Sign    Days of Exercise per Week: 0 days    Minutes of Exercise per Session: 0 min  Stress: Stress Concern Present (06/22/2022)   [place]    Feeling of Stress : To some extent  Social Connections: Socially Integrated (06/22/2022)   Social Connection and Isolation Panel [NHANES]    Frequency of Communication with Friends and Family: Three times a week    Frequency of Social Gatherings with Friends and Family: Three times a week    Attends Religious Services: 1 to 4 times per year    Active Member of Clubs or Organizations: No  Attends Archivist Meetings: 1 to 4 times per year    Marital Status: Married  Human resources officer Violence: Not At Risk (06/22/2022)   Humiliation, Afraid, Rape, and Kick questionnaire    Fear of Current or Ex-Partner: No    Emotionally Abused: No    Physically Abused: No    Sexually Abused: No    Family History  Problem Relation Age of Onset   Hepatitis C Mother 56       d/c at 21   Hypertension Father    Hepatitis C Brother    Breast cancer Neg Hx      Current Outpatient Medications:    amLODipine (NORVASC) 5 MG tablet, Take 1 tablet (5 mg total) by mouth daily., Disp: 30 tablet, Rfl: 3   diazepam (VALIUM) 5 MG tablet, TAKE 1 TABLET(5 MG) BY MOUTH EVERY 6 HOURS AS NEEDED FOR ANXIETY, Disp: 30 tablet, Rfl: 3   lidocaine-prilocaine (EMLA) cream, Apply to affected area once,  Disp: 30 g, Rfl: 3   lisinopril (ZESTRIL) 40 MG tablet, Take 1 tablet (40 mg total) by mouth daily., Disp: 30 tablet, Rfl: 3   Multiple Vitamins-Minerals (MULTIVITAMIN GUMMIES ADULTS PO), Take by mouth daily., Disp: , Rfl:    ondansetron (ZOFRAN) 8 MG tablet, Take 1 tablet (8 mg total) by mouth every 8 (eight) hours as needed for nausea or vomiting., Disp: 60 tablet, Rfl: 2   ondansetron (ZOFRAN-ODT) 8 MG disintegrating tablet, Take 8 mg by mouth every 8 (eight) hours as needed., Disp: , Rfl:    prochlorperazine (COMPAZINE) 10 MG tablet, Take 1 tablet (10 mg total) by mouth every 6 (six) hours as needed for nausea or vomiting., Disp: 60 tablet, Rfl: 2   rosuvastatin (CRESTOR) 10 MG tablet, Take 1 tablet (10 mg total) by mouth daily., Disp: 90 tablet, Rfl: 3   SYSTANE COMPLETE 0.6 % SOLN, Apply 1 drop to eye daily., Disp: , Rfl:    traMADol (ULTRAM) 50 MG tablet, TAKE 1 TABLET(50 MG) BY MOUTH DAILY, Disp: 30 tablet, Rfl: 3  Physical exam:  Vitals:   08/27/22 0850  BP: 138/84  Pulse: 67  Resp: 16  Temp: 97.9 F (36.6 C)  TempSrc: Oral  Weight: 142 lb 9.6 oz (64.7 kg)  Height: 5' 2.5" (1.588 m)   Physical Exam Cardiovascular:     Rate and Rhythm: Normal rate and regular rhythm.     Heart sounds: Normal heart sounds.  Pulmonary:     Effort: Pulmonary effort is normal.     Breath sounds: Normal breath sounds.  Abdominal:     General: Bowel sounds are normal.     Palpations: Abdomen is soft.  Skin:    General: Skin is warm and dry.  Neurological:     Mental Status: She is alert and oriented to person, place, and time.         Latest Ref Rng & Units 08/27/2022    8:09 AM  CMP  Glucose 70 - 99 mg/dL 124   BUN 8 - 23 mg/dL 14   Creatinine 0.44 - 1.00 mg/dL 0.62   Sodium 135 - 145 mmol/L 140   Potassium 3.5 - 5.1 mmol/L 3.4   Chloride 98 - 111 mmol/L 108   CO2 22 - 32 mmol/L 25   Calcium 8.9 - 10.3 mg/dL 9.2   Total Protein 6.5 - 8.1 g/dL 7.2   Total Bilirubin 0.3 - 1.2 mg/dL  0.6   Alkaline Phos 38 - 126 U/L 60  AST 15 - 41 U/L 27   ALT 0 - 44 U/L 28       Latest Ref Rng & Units 08/27/2022    8:09 AM  CBC  WBC 4.0 - 10.5 K/uL 3.4   Hemoglobin 12.0 - 15.0 g/dL 12.7   Hematocrit 36.0 - 46.0 % 38.6   Platelets 150 - 400 K/uL 138     No images are attached to the encounter.  CT CHEST ABDOMEN PELVIS W CONTRAST  Result Date: 08/08/2022 CLINICAL DATA:  History of endometrial cancer, follow-up. * Tracking Code: BO * EXAM: CT CHEST, ABDOMEN, AND PELVIS WITH CONTRAST TECHNIQUE: Multidetector CT imaging of the chest, abdomen and pelvis was performed following the standard protocol during bolus administration of intravenous contrast. RADIATION DOSE REDUCTION: This exam was performed according to the departmental dose-optimization program which includes automated exposure control, adjustment of the mA and/or kV according to patient size and/or use of iterative reconstruction technique. CONTRAST:  129m OMNIPAQUE IOHEXOL 300 MG/ML  SOLN COMPARISON:  Multiple priors including PET-CT June 04, 2022. FINDINGS: CT CHEST FINDINGS Cardiovascular: Right chest Port-A-Cath with tip at the superior cavoatrial junction. Aortic atherosclerosis. Normal caliber thoracic aorta. No central pulmonary embolus on this nondedicated study. Normal size heart. No significant pericardial effusion/thickening. Mediastinum/Nodes: Partially calcified right thyroid nodule measures 15 mm on image 7/2. No supraclavicular adenopathy. No pathologically enlarged mediastinal, hilar or axillary lymph nodes. Mild symmetric esophageal wall thickening. Lungs/Pleura: Scattered subsegmental atelectasis. No suspicious pulmonary nodules or masses. No pleural effusion. No pneumothorax. Musculoskeletal: Sclerotic focus in the T3 vertebral body is unchanged from PET-CT June 04, 2022 and was not hypermetabolic on that examination with Hounsfield units of 1,005 this is most consistent with a benign bone island. No aggressive  lytic or blastic lesion of bone. Multilevel degenerative changes spine. CT ABDOMEN PELVIS FINDINGS Hepatobiliary: Stable hypodense 3.1 cm lesion in the dome of the liver on image 43/2 previously characterized as a benign hepatic hemangioma on MRI August 08, 2016. No suspicious hepatic lesion. Mild wall thickening of a nondistended gallbladder which contains a 2 cm gallstone, suggestive of chronic inflammation. No biliary ductal dilation. Pancreas: No pancreatic ductal dilation or evidence of acute inflammation. Spleen: No splenomegaly or focal splenic lesion. Adrenals/Urinary Tract: Left adrenal nodule measures 9 mm on image 58/2, stable dating back to MRI abdomen dated August 08, 2016 consistent with a benign adenoma requiring no independent follow-up. Right adrenal gland appears normal. No hydronephrosis. Kidneys demonstrate symmetric enhancement and excretion of contrast material. Bilateral subcentimeter hypodense renal lesions are technically too small to accurately characterize but statistically likely reflect cysts and considered benign requiring no independent imaging follow-up. Urinary bladder is unremarkable for degree of distension. Stomach/Bowel: Radiopaque enteric contrast material traverses distal loops of small bowel. Stomach is unremarkable for degree of distension. No pathologic dilation of small or large bowel. The appendix and terminal ileum appear normal. No evidence of acute bowel inflammation. Vascular/Lymphatic: Normal caliber abdominal aorta. No pathologically enlarged abdominal or pelvic lymph nodes. Reproductive: Status post hysterectomy no suspicious nodularity along the vaginal cuff. No adnexal masses. Other: Decreased mild mesenteric and omental stranding without discrete nodularity. No significant abdominopelvic free fluid. Musculoskeletal: No aggressive lytic or blastic lesion of bone. Stable sclerotic focus in the left acetabulum which was not hypermetabolic on prior PET-CT and is  consistent with a benign bone island. IMPRESSION: 1. Status post hysterectomy without evidence of local recurrence. 2. No convincing evidence of metastatic disease within the chest, abdomen, or pelvis. 3. Decreased  mild mesenteric and omental stranding without discrete nodularity or significant abdominopelvic free fluid. Continued attention on follow-up imaging suggested. 4. Partially calcified right thyroid nodule measuring 15 mm, this was mildly hypermetabolic on prior PET-CT and warrants further evaluation with ultrasound and possible FNA. 5. Mild symmetric esophageal wall thickening, suggestive of esophagitis. 6. Cholelithiasis with mild wall thickening of a nondistended gallbladder, suggestive of chronic inflammation. Consider further evaluation with nuclear medicine HIDA scan with ejection fraction 7. Stable benign hepatic hemangioma and left adrenal adenoma. 8.  Aortic Atherosclerosis (ICD10-I70.0). Electronically Signed   By: Dahlia Bailiff M.D.   On: 08/08/2022 15:51     Assessment and plan- Patient is a 61 y.o. female with FIGO stage Ib endometrioid endometrial cancer pT1b N0 M0.  She is here for on treatment as planned prior to cycle 4 of CarboTaxol chemotherapy  Counts of proceed with cycle 4 of CarboTaxol chemotherapy today.  She does have some baseline leukopenia/neutropenia but ANC remains more than 1 to proceed with chemotherapy today.  She will receive the Udenyca on day 3 of pump disconnect.  I will see her back in 3 weeks for cycle 5.  Plan is to complete 6 cycles followed by vaginal brachytherapy.  Chemo-induced peripheral neuropathy:Overall stable.  Continue to monitor.  She is not on any medications at the moment.  Neulasta associated bone pain: I will give her a prescription for oxycodone 5 mg every 8 hours as needed 20 tablets and no refills   Visit Diagnosis 1. Encounter for antineoplastic chemotherapy   2. Endometrial carcinoma ([place])   3. Chemotherapy induced neutropenia ([facility])       Dr. Randa Evens, MD, MPH [facility] [phone] 08/27/2022 9:08 AM

## 2022-08-29 ENCOUNTER — Inpatient Hospital Stay: Payer: Self-pay

## 2022-08-29 DIAGNOSIS — C541 Malignant neoplasm of endometrium: Secondary | ICD-10-CM

## 2022-08-29 MED ORDER — PEGFILGRASTIM-CBQV 6 MG/0.6ML ~~LOC~~ SOSY
6.0000 mg | PREFILLED_SYRINGE | Freq: Once | SUBCUTANEOUS | Status: AC
  Administered 2022-08-29: 6 mg via SUBCUTANEOUS
  Filled 2022-08-29: qty 0.6

## 2022-09-03 ENCOUNTER — Ambulatory Visit: Payer: Self-pay

## 2022-09-06 ENCOUNTER — Ambulatory Visit: Payer: Self-pay

## 2022-09-10 ENCOUNTER — Ambulatory Visit: Payer: Self-pay

## 2022-09-12 ENCOUNTER — Ambulatory Visit: Payer: Self-pay

## 2022-09-12 MED FILL — Fosaprepitant Dimeglumine For IV Infusion 150 MG (Base Eq): INTRAVENOUS | Qty: 5 | Status: AC

## 2022-09-12 MED FILL — Dexamethasone Sodium Phosphate Inj 100 MG/10ML: INTRAMUSCULAR | Qty: 1 | Status: AC

## 2022-09-17 ENCOUNTER — Encounter: Payer: Self-pay | Admitting: Oncology

## 2022-09-17 ENCOUNTER — Inpatient Hospital Stay (HOSPITAL_BASED_OUTPATIENT_CLINIC_OR_DEPARTMENT_OTHER): Payer: Self-pay | Admitting: Oncology

## 2022-09-17 ENCOUNTER — Inpatient Hospital Stay: Payer: Self-pay

## 2022-09-17 ENCOUNTER — Ambulatory Visit: Payer: Self-pay

## 2022-09-17 VITALS — BP 131/65 | HR 66 | Temp 98.3°F | Resp 18 | Wt 149.0 lb

## 2022-09-17 DIAGNOSIS — T451X5A Adverse effect of antineoplastic and immunosuppressive drugs, initial encounter: Secondary | ICD-10-CM

## 2022-09-17 DIAGNOSIS — C541 Malignant neoplasm of endometrium: Secondary | ICD-10-CM

## 2022-09-17 DIAGNOSIS — G620 Drug-induced polyneuropathy: Secondary | ICD-10-CM

## 2022-09-17 DIAGNOSIS — Z5111 Encounter for antineoplastic chemotherapy: Secondary | ICD-10-CM

## 2022-09-17 LAB — CBC WITH DIFFERENTIAL/PLATELET
Abs Immature Granulocytes: 0.01 10*3/uL (ref 0.00–0.07)
Basophils Absolute: 0.1 10*3/uL (ref 0.0–0.1)
Basophils Relative: 2 %
Eosinophils Absolute: 0 10*3/uL (ref 0.0–0.5)
Eosinophils Relative: 1 %
HCT: 35.4 % — ABNORMAL LOW (ref 36.0–46.0)
Hemoglobin: 12.2 g/dL (ref 12.0–15.0)
Immature Granulocytes: 0 %
Lymphocytes Relative: 32 %
Lymphs Abs: 1.2 10*3/uL (ref 0.7–4.0)
MCH: 29.2 pg (ref 26.0–34.0)
MCHC: 34.5 g/dL (ref 30.0–36.0)
MCV: 84.7 fL (ref 80.0–100.0)
Monocytes Absolute: 0.5 10*3/uL (ref 0.1–1.0)
Monocytes Relative: 12 %
Neutro Abs: 2.1 10*3/uL (ref 1.7–7.7)
Neutrophils Relative %: 53 %
Platelets: 200 10*3/uL (ref 150–400)
RBC: 4.18 MIL/uL (ref 3.87–5.11)
RDW: 15.8 % — ABNORMAL HIGH (ref 11.5–15.5)
WBC: 3.8 10*3/uL — ABNORMAL LOW (ref 4.0–10.5)
nRBC: 0 % (ref 0.0–0.2)

## 2022-09-17 LAB — COMPREHENSIVE METABOLIC PANEL
ALT: 33 U/L (ref 0–44)
AST: 28 U/L (ref 15–41)
Albumin: 4.3 g/dL (ref 3.5–5.0)
Alkaline Phosphatase: 72 U/L (ref 38–126)
Anion gap: 8 (ref 5–15)
BUN: 18 mg/dL (ref 8–23)
CO2: 27 mmol/L (ref 22–32)
Calcium: 9.5 mg/dL (ref 8.9–10.3)
Chloride: 102 mmol/L (ref 98–111)
Creatinine, Ser: 0.65 mg/dL (ref 0.44–1.00)
GFR, Estimated: >60 mL/min (ref >60)
Glucose, Bld: 107 mg/dL — ABNORMAL HIGH (ref 70–99)
Potassium: 3.6 mmol/L (ref 3.5–5.1)
Sodium: 137 mmol/L (ref 135–145)
Total Bilirubin: 0.5 mg/dL (ref 0.3–1.2)
Total Protein: 7.6 g/dL (ref 6.5–8.1)

## 2022-09-17 MED ORDER — SODIUM CHLORIDE 0.9 % IV SOLN
10.0000 mg | Freq: Once | INTRAVENOUS | Status: AC
  Administered 2022-09-17: 10 mg via INTRAVENOUS
  Filled 2022-09-17: qty 10

## 2022-09-17 MED ORDER — SODIUM CHLORIDE 0.9 % IV SOLN
175.0000 mg/m2 | Freq: Once | INTRAVENOUS | Status: AC
  Administered 2022-09-17: 294 mg via INTRAVENOUS
  Filled 2022-09-17: qty 49

## 2022-09-17 MED ORDER — SODIUM CHLORIDE 0.9 % IV SOLN
Freq: Once | INTRAVENOUS | Status: AC
  Filled 2022-09-17: qty 250

## 2022-09-17 MED ORDER — DIPHENHYDRAMINE HCL 50 MG/ML IJ SOLN
25.0000 mg | Freq: Once | INTRAMUSCULAR | Status: AC
  Administered 2022-09-17: 25 mg via INTRAVENOUS
  Filled 2022-09-17: qty 1

## 2022-09-17 MED ORDER — FAMOTIDINE IN NACL 20-0.9 MG/50ML-% IV SOLN
20.0000 mg | Freq: Once | INTRAVENOUS | Status: AC
  Administered 2022-09-17: 20 mg via INTRAVENOUS
  Filled 2022-09-17: qty 50

## 2022-09-17 MED ORDER — SODIUM CHLORIDE 0.9 % IV SOLN
150.0000 mg | Freq: Once | INTRAVENOUS | Status: AC
  Administered 2022-09-17: 150 mg via INTRAVENOUS
  Filled 2022-09-17: qty 150

## 2022-09-17 MED ORDER — PALONOSETRON HCL INJECTION 0.25 MG/5ML
0.2500 mg | Freq: Once | INTRAVENOUS | Status: AC
  Administered 2022-09-17: 0.25 mg via INTRAVENOUS
  Filled 2022-09-17: qty 5

## 2022-09-17 MED ORDER — SODIUM CHLORIDE 0.9% FLUSH
10.0000 mL | INTRAVENOUS | Status: DC | PRN
  Filled 2022-09-17: qty 10

## 2022-09-17 MED ORDER — HEPARIN SOD (PORK) LOCK FLUSH 100 UNIT/ML IV SOLN
500.0000 [IU] | Freq: Once | INTRAVENOUS | Status: AC | PRN
  Administered 2022-09-17: 500 [IU]
  Filled 2022-09-17: qty 5

## 2022-09-17 MED ORDER — SODIUM CHLORIDE 0.9 % IV SOLN
498.5000 mg | Freq: Once | INTRAVENOUS | Status: AC
  Administered 2022-09-17: 500 mg via INTRAVENOUS
  Filled 2022-09-17: qty 50

## 2022-09-17 NOTE — Progress Notes (Signed)
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 Claiborne Billings from GYN oncology at [facility].  She underwent robotic surgery on 03/30/2022.  Final pathology showed:    Final Pathologic Diagnosis  A. SENTINEL LYMPH NODE, RIGHT EXTERNAL ILIAC ARTERY, RESECTION: Two lymph nodes, negative for metastatic carcinoma (0/2).    B. SENTINEL LYMPH NODE, LEFT EXTERNAL ILIAC ARTERY, RESECTION: One lymph node, negative for metastatic carcinoma (0/1).    C. UTERUS, FALLOPIAN TUBE AND OVARIES, HYSTERECTOMY WITH BILATERAL SALPINGO-OOPHORECTOMY: Endometrioid adenocarcinoma, FIGO grade 2. Tumor measures 5 cm in greatest dimension.  Tumor invades 14 of 17 mm thick myometrium (greater than 50%).  Margins of resection are uninvolved by tumor.  Tumor involves lower uterine segment.  Cervix is uninvolved by tumor.  Benign right fallopian tube and ovary.  Left ovary with tumor located within lymphovascular spaces, no tissue invasion identified.  Left fallopian tube with tumor within lymphovascular spaces, no tissue invasion identified. Free floating tumor located inside left fallopian tube.  pT1b pN0      She was recommended adjuvant chemotherapy with Norma Fredrickson Taxol for 3 cycles by Dr. Claiborne Billings followed  by vaginal brachytherapy.  Patient states that she does not wish to travel all the way to [facility] given her ongoing fatigue as well as discomfort from episiotomy and would prefer to get treatment locally.  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  She is not a diabetic.  She has baseline rheumatoid arthritis   Pathology was also second for a second opinion to [facility] as well.  Path report over there suggested that this was a T3a tumor FIGO stage IIIa disease  Interval history-neuropathy in her hands and feet is overall stable.  Has ongoing fatigue from chemotherapy but overall tolerating it well.  Denies any significant nausea or vomiting.  ECOG PS- 1 Pain scale- 0   Review of systems- Review of Systems  Constitutional:  Positive for malaise/fatigue. Negative for chills, fever and weight loss.  HENT:  Negative for congestion, ear discharge and nosebleeds.   Eyes:  Negative for blurred vision.  Respiratory:  Negative for cough, hemoptysis, sputum production, shortness of breath and wheezing.   Cardiovascular:  Negative for chest pain, palpitations, orthopnea and claudication.  Gastrointestinal:  Negative for abdominal pain, blood in stool, constipation, diarrhea, heartburn, melena, nausea and vomiting.  Genitourinary:  Negative for dysuria, flank pain, frequency, hematuria and urgency.  Musculoskeletal:  Negative for back pain, joint pain and myalgias.  Skin:  Negative for rash.  Neurological:  Negative for dizziness, tingling, focal weakness, seizures, weakness and headaches.  Endo/Heme/Allergies:  Does not bruise/bleed easily.  Psychiatric/Behavioral:  Negative for depression and suicidal ideas. The patient does not have insomnia.       Allergies  Allergen Reactions   Azithromycin Other (See Comments)    Gets a bad yeast infection  from this.     Past Medical History:  Diagnosis Date   Allergic rhinitis due to pollen    Endometrial cancer ([place])    Hyperlipidemia LDL  goal <100 02/16/2020   Hypertension 12/19/2016     Past Surgical History:  Procedure Laterality Date   CESAREAN SECTION     COLONOSCOPY WITH PROPOFOL N/A 11/12/2016   Procedure: COLONOSCOPY WITH PROPOFOL;  Surgeon: Lucilla Lame, MD;  Location: [place];  Service: Endoscopy;  Laterality: N/A;   COLONOSCOPY WITH PROPOFOL N/A 03/29/2017   Procedure: COLONOSCOPY WITH PROPOFOL;  Surgeon: Lucilla Lame, MD;  Location: [place];  Service: Endoscopy;  Laterality: N/A;   COLONOSCOPY WITH PROPOFOL N/A 03/14/2020   Procedure: COLONOSCOPY WITH PROPOFOL;  Surgeon: Lucilla Lame, MD;  Location: [place];  Service: Endoscopy;  Laterality: N/A;  priority 4   IR IMAGING GUIDED PORT INSERTION  06/01/2022   POLYPECTOMY  11/12/2016   Procedure: POLYPECTOMY;  Surgeon: Lucilla Lame, MD;  Location: [place];  Service: Endoscopy;;   POLYPECTOMY  03/29/2017   Procedure: POLYPECTOMY;  Surgeon: Lucilla Lame, MD;  Location: [place];  Service: Endoscopy;;    Social History   Socioeconomic History   Marital status: Married    Spouse name: Not on file   Number of children: Not on file   Years of education: Not on file   Highest education level: Not on file  Occupational History   Not on file  Tobacco Use   Smoking status: Former    Types: Cigarettes    Quit date: 10/22/1978    Years since quitting: 43.9   Smokeless tobacco: Never  Vaping Use   Vaping Use: Never used  Substance and Sexual Activity   Alcohol use: No   Drug use: No   Sexual activity: Yes    Partners: Male  Other Topics Concern   Not on file  Social History Narrative   Not on file   Social Determinants of Health   Financial Resource Strain: Low Risk  (06/22/2022)   Overall Financial Resource Strain (CARDIA)    Difficulty of Paying Living Expenses: Not very hard  Food Insecurity: No Food Insecurity (06/22/2022)   Hunger Vital Sign    Worried About Running Out of Food in the Last Year: Never  true    Ran Out of Food in the Last Year: Never true  Transportation Needs: No Transportation Needs (06/22/2022)   PRAPARE - Hydrologist (Medical): No    Lack of Transportation (Non-Medical): No  Physical Activity: Inactive (06/22/2022)   Exercise Vital Sign    Days of Exercise per Week: 0 days    Minutes of Exercise per Session: 0 min  Stress: Stress Concern Present (06/22/2022)   [place]    Feeling of Stress : To some extent  Social Connections: Socially Integrated (06/22/2022)   Social Connection and Isolation Panel [NHANES]    Frequency of Communication with Friends and Family: Three times a week    Frequency of Social Gatherings with Friends and Family: Three times a week    Attends Religious Services: 1 to 4 times per year    Active Member of Clubs or Organizations: No    Attends Archivist Meetings: 1 to 4 times per year    Marital Status: Married  Human resources officer Violence: Not At Risk (06/22/2022)   Humiliation, Afraid, Rape, and Kick questionnaire    Fear of  Current or Ex-Partner: No    Emotionally Abused: No    Physically Abused: No    Sexually Abused: No    Family History  Problem Relation Age of Onset   Hepatitis C Mother 23       d/c at 30   Hypertension Father    Hepatitis C Brother    Breast cancer Neg Hx      Current Outpatient Medications:    amLODipine (NORVASC) 5 MG tablet, Take 1 tablet (5 mg total) by mouth daily., Disp: 30 tablet, Rfl: 3   diazepam (VALIUM) 5 MG tablet, TAKE 1 TABLET(5 MG) BY MOUTH EVERY 6 HOURS AS NEEDED FOR ANXIETY, Disp: 30 tablet, Rfl: 3   Lidocaine-Hydrocort, Perianal, 3-0.5 % CREA, Apply topically., Disp: , Rfl:    lidocaine-prilocaine (EMLA) cream, Apply to affected area once, Disp: 30 g, Rfl: 3   lisinopril (ZESTRIL) 40 MG tablet, Take 1 tablet (40 mg total) by mouth daily., Disp: 30 tablet, Rfl: 3   Multiple Vitamins-Minerals  (MULTIVITAMIN GUMMIES ADULTS PO), Take by mouth daily., Disp: , Rfl:    oxyCODONE (OXY IR/ROXICODONE) 5 MG immediate release tablet, Take 1 tablet (5 mg total) by mouth every 8 (eight) hours., Disp: 20 tablet, Rfl: 0   prochlorperazine (COMPAZINE) 10 MG tablet, Take 1 tablet (10 mg total) by mouth every 6 (six) hours as needed for nausea or vomiting., Disp: 60 tablet, Rfl: 2   SYSTANE COMPLETE 0.6 % SOLN, Apply 1 drop to eye daily., Disp: , Rfl:    traMADol (ULTRAM) 50 MG tablet, TAKE 1 TABLET(50 MG) BY MOUTH DAILY, Disp: 30 tablet, Rfl: 3   ondansetron (ZOFRAN) 8 MG tablet, Take 1 tablet (8 mg total) by mouth every 8 (eight) hours as needed for nausea or vomiting. (Patient not taking: Reported on 09/17/2022), Disp: 60 tablet, Rfl: 2   ondansetron (ZOFRAN-ODT) 8 MG disintegrating tablet, Take 8 mg by mouth every 8 (eight) hours as needed. (Patient not taking: Reported on 09/17/2022), Disp: , Rfl:    rosuvastatin (CRESTOR) 10 MG tablet, Take 1 tablet (10 mg total) by mouth daily. (Patient not taking: Reported on 09/17/2022), Disp: 90 tablet, Rfl: 3 No current facility-administered medications for this visit.  Facility-Administered Medications Ordered in Other Visits:    sodium chloride flush (NS) 0.9 % injection 10 mL, 10 mL, Intracatheter, PRN, Sindy Guadeloupe, MD  Physical exam:  Vitals:   09/17/22 0841  BP: 131/65  Pulse: 66  Resp: 18  Temp: 98.3 F (36.8 C)  SpO2: 100%  Weight: 149 lb (67.6 kg)   Physical Exam Constitutional:      General: She is not in acute distress. Cardiovascular:     Rate and Rhythm: Normal rate and regular rhythm.     Heart sounds: Normal heart sounds.  Pulmonary:     Effort: Pulmonary effort is normal.     Breath sounds: Normal breath sounds.  Skin:    General: Skin is warm and dry.  Neurological:     Mental Status: She is alert and oriented to person, place, and time.         Latest Ref Rng & Units 09/17/2022    8:24 AM  CMP  Glucose 70 - 99  mg/dL 107   BUN 8 - 23 mg/dL 18   Creatinine 0.44 - 1.00 mg/dL 0.65   Sodium 135 - 145 mmol/L 137   Potassium 3.5 - 5.1 mmol/L 3.6   Chloride 98 - 111 mmol/L 102   CO2 22 -  32 mmol/L 27   Calcium 8.9 - 10.3 mg/dL 9.5   Total Protein 6.5 - 8.1 g/dL 7.6   Total Bilirubin 0.3 - 1.2 mg/dL 0.5   Alkaline Phos 38 - 126 U/L 72   AST 15 - 41 U/L 28   ALT 0 - 44 U/L 33       Latest Ref Rng & Units 09/17/2022    8:24 AM  CBC  WBC 4.0 - 10.5 K/uL 3.8   Hemoglobin 12.0 - 15.0 g/dL 12.2   Hematocrit 36.0 - 46.0 % 35.4   Platelets 150 - 400 K/uL 200      Assessment and plan- Patient is a 61 y.o. female  with FIGO stage Ib endometrioid endometrial cancer pT1b N0 M0.  She is here for on treatment assessment prior to cycle 5 of CarboTaxol chemotherapy  Counts okay to proceed with cycle 5 of CarboTaxol chemotherapy today with Udenyca on day 3.  I will see her back in 3 weeks for cycle 6 which would be her last cycle.  She is seeing Dr. Donella Stade again next week with plans to start vaginal brachytherapy following completion of chemotherapy.  Ideally I would like her to wait for about a week or 10 days after completing chemotherapy before she starts vaginal brachii given the risk of chemo induced neutropenia.  Chemo-induced peripheral neuropathy: Overall stable.  Continue to monitor   Visit Diagnosis 1. Endometrial carcinoma ([place])   2. Encounter for antineoplastic chemotherapy   3. Chemotherapy-induced peripheral neuropathy ([place])      Dr. Randa Evens, MD, MPH [facility] at [facility] [phone] 09/17/2022 3:48 PM

## 2022-09-17 NOTE — Patient Instructions (Signed)
[facility]  Discharge Instructions: Thank you for choosing [facility] to provide your oncology and hematology care.  If you have a lab appointment with the Cancer Center, please go directly to the Cancer Center and check in at the registration area.  Wear comfortable clothing and clothing appropriate for easy access to any Portacath or PICC line.   We strive to give you quality time with your provider. You may need to reschedule your appointment if you arrive late (15 or more minutes).  Arriving late affects you and other patients whose appointments are after yours.  Also, if you miss three or more appointments without notifying the office, you may be dismissed from the clinic at the provider's discretion.      For prescription refill requests, have your pharmacy contact our office and allow 72 hours for refills to be completed.    Today you received the following chemotherapy and/or immunotherapy agents taxol, carboplatin      To help prevent nausea and vomiting after your treatment, we encourage you to take your nausea medication as directed.  BELOW ARE SYMPTOMS THAT SHOULD BE REPORTED IMMEDIATELY: *FEVER GREATER THAN 100.4 F (38 C) OR HIGHER *CHILLS OR SWEATING *NAUSEA AND VOMITING THAT IS NOT CONTROLLED WITH YOUR NAUSEA MEDICATION *UNUSUAL SHORTNESS OF BREATH *UNUSUAL BRUISING OR BLEEDING *URINARY PROBLEMS (pain or burning when urinating, or frequent urination) *BOWEL PROBLEMS (unusual diarrhea, constipation, pain near the anus) TENDERNESS IN MOUTH AND THROAT WITH OR WITHOUT PRESENCE OF ULCERS (sore throat, sores in mouth, or a toothache) UNUSUAL RASH, SWELLING OR PAIN  UNUSUAL VAGINAL DISCHARGE OR ITCHING   Items with * indicate a potential emergency and should be followed up as soon as possible or go to the Emergency Department if any problems should occur.  Please show the CHEMOTHERAPY ALERT CARD or IMMUNOTHERAPY ALERT CARD at  check-in to the Emergency Department and triage nurse.  Should you have questions after your visit or need to cancel or reschedule your appointment, please contact [facility]  [phone] and follow the prompts.  Office hours are 8:00 a.m. to 4:30 p.m. Monday - Friday. Please note that voicemails left after 4:00 p.m. may not be returned until the following business day.  We are closed weekends and major holidays. You have access to a nurse at all times for urgent questions. Please call the main number to the clinic [phone] and follow the prompts.  For any non-urgent questions, you may also contact your provider using MyChart. We now offer e-Visits for anyone 18 and older to request care online for non-urgent symptoms. For details visit [email or web address].   Also download the MyChart app! Go to the app store, search "MyChart", open the app, select [facility], and log in with your MyChart username and password.  Masks are optional in the cancer centers. If you would like for your care team to wear a mask while they are taking care of you, please let them know. For doctor visits, patients may have with them one support person who is at least 62 years old. At this time, visitors are not allowed in the infusion area.   

## 2022-09-19 ENCOUNTER — Inpatient Hospital Stay: Payer: Self-pay

## 2022-09-19 DIAGNOSIS — C541 Malignant neoplasm of endometrium: Secondary | ICD-10-CM

## 2022-09-19 MED ORDER — PEGFILGRASTIM-CBQV 6 MG/0.6ML ~~LOC~~ SOSY
6.0000 mg | PREFILLED_SYRINGE | Freq: Once | SUBCUTANEOUS | Status: AC
  Administered 2022-09-19: 6 mg via SUBCUTANEOUS
  Filled 2022-09-19: qty 0.6

## 2022-09-25 ENCOUNTER — Ambulatory Visit
Admission: RE | Admit: 2022-09-25 | Discharge: 2022-09-25 | Disposition: A | Discharge status: Home / Self Care | Payer: Self-pay | Source: Ambulatory Visit | Attending: Radiation Oncology | Admitting: Radiation Oncology

## 2022-09-25 ENCOUNTER — Encounter: Payer: Self-pay | Admitting: Radiation Oncology

## 2022-09-25 VITALS — BP 142/74 | HR 82 | Temp 97.2°F | Resp 18 | Ht 62.5 in | Wt 151.1 lb

## 2022-09-25 DIAGNOSIS — G629 Polyneuropathy, unspecified: Secondary | ICD-10-CM | POA: Insufficient documentation

## 2022-09-25 DIAGNOSIS — C541 Malignant neoplasm of endometrium: Secondary | ICD-10-CM | POA: Insufficient documentation

## 2022-09-25 NOTE — Progress Notes (Signed)
Radiation Oncology Follow up Note  Name: Jaime Johnson   Date:   09/25/2022 MRN:  [id number] DOB: [birth year]    This 61 y.o. female presents to the clinic today for reevaluation of patient completing chemotherapy for stage III endometrial carcinoma status post robotic surgery at [facility] for endometrioid carcinoma stage III FIGO grade 2.  REFERRING PROVIDER: Owens Loffler, MD  HPI: Patient is a 61 year old female who is about to undergo her last cycle of chemotherapy.  With CarboTaxol.  She is tolerated chemotherapy well.  She does have some peripheral neuropathy.  Original tumor was a FIGO grade 2 measuring 5 cm in greatest dimension with 14 out of 17 mm of myometrial involvement.  There was free-floating tumor located inside her left fallopian tube.  Reevaluation at [facility] thought this was a T3a tumor.  She is now referred back to radiation to a schedule vaginal brachytherapy.  COMPLICATIONS OF TREATMENT: none  FOLLOW UP COMPLIANCE: keeps appointments   PHYSICAL EXAM:  BP (!) 142/74   Pulse 82   Temp (!) 97.2 F (36.2 C)   Resp 18   Ht 5' 2.5" (1.588 m)   Wt 151 lb 1.6 oz (68.5 kg)   LMP 02/29/2020 (Approximate) Comment: Has had random spotting; hadn't had a period 1.5 yrs prior  BMI 27.20 kg/m  Well-developed well-nourished patient in NAD. HEENT reveals PERLA, EOMI, discs not visualized.  Oral cavity is clear. No oral mucosal lesions are identified. Neck is clear without evidence of cervical or supraclavicular adenopathy. Lungs are clear to A&P. Cardiac examination is essentially unremarkable with regular rate and rhythm without murmur rub or thrill. Abdomen is benign with no organomegaly or masses noted. Motor sensory and DTR levels are equal and symmetric in the upper and lower extremities. Cranial nerves II through XII are grossly intact. Proprioception is intact. No peripheral adenopathy or edema is identified. No motor or sensory levels are noted. Crude visual fields are  within normal range.  RADIOLOGY RESULTS: No current films for review  PLAN: This time elect go ahead with 23.5 Gray in 5 fractions of high-dose-rate remote after loading vaginal brachytherapy.  Risks and benefits of treatment again including possible mild increase in lower urinary tract symptoms diarrhea and fatigue all were discussed in detail with the patient.  We will also discussed her strategies to prevent vaginal stenosis after completion of her brachytherapy.  I have personally 7 ordered CT simulation approximately 10 days after her chemotherapy is complete.  Patient comprehends my recommendation well.  I would like to take this opportunity to thank you for allowing me to participate in the care of your patient.Noreene Filbert, MD

## 2022-10-02 ENCOUNTER — Ambulatory Visit: Payer: Self-pay

## 2022-10-02 ENCOUNTER — Ambulatory Visit: Admission: RE | Admit: 2022-10-02 | Payer: Self-pay | Source: Ambulatory Visit

## 2022-10-04 ENCOUNTER — Ambulatory Visit: Payer: Self-pay

## 2022-10-05 MED FILL — Dexamethasone Sodium Phosphate Inj 100 MG/10ML: INTRAMUSCULAR | Qty: 1 | Status: AC

## 2022-10-05 MED FILL — Fosaprepitant Dimeglumine For IV Infusion 150 MG (Base Eq): INTRAVENOUS | Qty: 5 | Status: AC

## 2022-10-08 ENCOUNTER — Inpatient Hospital Stay: Payer: Self-pay

## 2022-10-08 ENCOUNTER — Ambulatory Visit: Payer: Self-pay

## 2022-10-08 ENCOUNTER — Inpatient Hospital Stay (HOSPITAL_BASED_OUTPATIENT_CLINIC_OR_DEPARTMENT_OTHER): Payer: Self-pay | Admitting: Oncology

## 2022-10-08 ENCOUNTER — Inpatient Hospital Stay: Payer: Self-pay | Attending: Oncology

## 2022-10-08 ENCOUNTER — Encounter: Payer: Self-pay | Admitting: Oncology

## 2022-10-08 VITALS — BP 143/77 | HR 72 | Temp 98.4°F | Wt 149.0 lb

## 2022-10-08 DIAGNOSIS — Z79899 Other long term (current) drug therapy: Secondary | ICD-10-CM | POA: Insufficient documentation

## 2022-10-08 DIAGNOSIS — C541 Malignant neoplasm of endometrium: Secondary | ICD-10-CM

## 2022-10-08 DIAGNOSIS — Z5189 Encounter for other specified aftercare: Secondary | ICD-10-CM | POA: Insufficient documentation

## 2022-10-08 DIAGNOSIS — Z5111 Encounter for antineoplastic chemotherapy: Secondary | ICD-10-CM | POA: Insufficient documentation

## 2022-10-08 DIAGNOSIS — Z87891 Personal history of nicotine dependence: Secondary | ICD-10-CM | POA: Insufficient documentation

## 2022-10-08 DIAGNOSIS — G620 Drug-induced polyneuropathy: Secondary | ICD-10-CM

## 2022-10-08 LAB — COMPREHENSIVE METABOLIC PANEL
ALT: 37 U/L (ref 0–44)
AST: 30 U/L (ref 15–41)
Albumin: 4.2 g/dL (ref 3.5–5.0)
Alkaline Phosphatase: 72 U/L (ref 38–126)
Anion gap: 10 (ref 5–15)
BUN: 15 mg/dL (ref 8–23)
CO2: 26 mmol/L (ref 22–32)
Calcium: 8.9 mg/dL (ref 8.9–10.3)
Chloride: 104 mmol/L (ref 98–111)
Creatinine, Ser: 0.67 mg/dL (ref 0.44–1.00)
GFR, Estimated: >60 mL/min (ref >60)
Glucose, Bld: 102 mg/dL — ABNORMAL HIGH (ref 70–99)
Potassium: 3.5 mmol/L (ref 3.5–5.1)
Sodium: 140 mmol/L (ref 135–145)
Total Bilirubin: 0.6 mg/dL (ref 0.3–1.2)
Total Protein: 7.1 g/dL (ref 6.5–8.1)

## 2022-10-08 LAB — CBC WITH DIFFERENTIAL/PLATELET
Abs Immature Granulocytes: 0.01 10*3/uL (ref 0.00–0.07)
Basophils Absolute: 0 10*3/uL (ref 0.0–0.1)
Basophils Relative: 1 %
Eosinophils Absolute: 0 10*3/uL (ref 0.0–0.5)
Eosinophils Relative: 1 %
HCT: 33.8 % — ABNORMAL LOW (ref 36.0–46.0)
Hemoglobin: 11.7 g/dL — ABNORMAL LOW (ref 12.0–15.0)
Immature Granulocytes: 0 %
Lymphocytes Relative: 29 %
Lymphs Abs: 1.1 10*3/uL (ref 0.7–4.0)
MCH: 30.2 pg (ref 26.0–34.0)
MCHC: 34.6 g/dL (ref 30.0–36.0)
MCV: 87.1 fL (ref 80.0–100.0)
Monocytes Absolute: 0.5 10*3/uL (ref 0.1–1.0)
Monocytes Relative: 13 %
Neutro Abs: 2.2 10*3/uL (ref 1.7–7.7)
Neutrophils Relative %: 56 %
Platelets: 137 10*3/uL — ABNORMAL LOW (ref 150–400)
RBC: 3.88 MIL/uL (ref 3.87–5.11)
RDW: 15.6 % — ABNORMAL HIGH (ref 11.5–15.5)
WBC: 3.9 10*3/uL — ABNORMAL LOW (ref 4.0–10.5)
nRBC: 0 % (ref 0.0–0.2)

## 2022-10-08 MED ORDER — HEPARIN SOD (PORK) LOCK FLUSH 100 UNIT/ML IV SOLN
500.0000 [IU] | Freq: Once | INTRAVENOUS | Status: AC | PRN
  Administered 2022-10-08: 500 [IU]
  Filled 2022-10-08: qty 5

## 2022-10-08 MED ORDER — PALONOSETRON HCL INJECTION 0.25 MG/5ML
0.2500 mg | Freq: Once | INTRAVENOUS | Status: AC
  Administered 2022-10-08: 0.25 mg via INTRAVENOUS
  Filled 2022-10-08: qty 5

## 2022-10-08 MED ORDER — SODIUM CHLORIDE 0.9 % IV SOLN
150.0000 mg | Freq: Once | INTRAVENOUS | Status: AC
  Administered 2022-10-08: 150 mg via INTRAVENOUS
  Filled 2022-10-08: qty 150

## 2022-10-08 MED ORDER — SODIUM CHLORIDE 0.9 % IV SOLN
10.0000 mg | Freq: Once | INTRAVENOUS | Status: AC
  Administered 2022-10-08: 10 mg via INTRAVENOUS
  Filled 2022-10-08: qty 10

## 2022-10-08 MED ORDER — SODIUM CHLORIDE 0.9 % IV SOLN
Freq: Once | INTRAVENOUS | Status: AC
  Filled 2022-10-08: qty 250

## 2022-10-08 MED ORDER — DIPHENHYDRAMINE HCL 50 MG/ML IJ SOLN
25.0000 mg | Freq: Once | INTRAMUSCULAR | Status: AC
  Administered 2022-10-08: 25 mg via INTRAVENOUS
  Filled 2022-10-08: qty 1

## 2022-10-08 MED ORDER — FAMOTIDINE IN NACL 20-0.9 MG/50ML-% IV SOLN
20.0000 mg | Freq: Once | INTRAVENOUS | Status: AC
  Administered 2022-10-08: 20 mg via INTRAVENOUS
  Filled 2022-10-08: qty 50

## 2022-10-08 MED ORDER — SODIUM CHLORIDE 0.9 % IV SOLN
175.0000 mg/m2 | Freq: Once | INTRAVENOUS | Status: AC
  Administered 2022-10-08: 294 mg via INTRAVENOUS
  Filled 2022-10-08: qty 49

## 2022-10-08 MED ORDER — SODIUM CHLORIDE 0.9 % IV SOLN
498.5000 mg | Freq: Once | INTRAVENOUS | Status: AC
  Administered 2022-10-08: 500 mg via INTRAVENOUS
  Filled 2022-10-08: qty 50

## 2022-10-08 NOTE — Progress Notes (Signed)
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 Claiborne Billings from GYN oncology at [facility].  She underwent robotic surgery on 03/30/2022.  Final pathology showed:    Final Pathologic Diagnosis  A. SENTINEL LYMPH NODE, RIGHT EXTERNAL ILIAC ARTERY, RESECTION: Two lymph nodes, negative for metastatic carcinoma (0/2).    B. SENTINEL LYMPH NODE, LEFT EXTERNAL ILIAC ARTERY, RESECTION: One lymph node, negative for metastatic carcinoma (0/1).    C. UTERUS, FALLOPIAN TUBE AND OVARIES, HYSTERECTOMY WITH BILATERAL SALPINGO-OOPHORECTOMY: Endometrioid adenocarcinoma, FIGO grade 2. Tumor measures 5 cm in greatest dimension.  Tumor invades 14 of 17 mm thick myometrium (greater than 50%).  Margins of resection are uninvolved by tumor.  Tumor involves lower uterine segment.  Cervix is uninvolved by tumor.  Benign right fallopian tube and ovary.  Left ovary with tumor located within lymphovascular spaces, no tissue invasion identified.  Left fallopian tube with tumor within lymphovascular spaces, no tissue invasion identified. Free floating tumor located inside left fallopian tube.  pT1b pN0    She was recommended adjuvant chemotherapy with Norma Fredrickson Taxol for 3 cycles by Dr. Claiborne Billings followed by  vaginal brachytherapy.  Patient states that she does not wish to travel all the way to [facility] given her ongoing fatigue as well as discomfort from episiotomy and would prefer to get treatment locally.  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  She is not a diabetic.  She has baseline rheumatoid arthritis   Pathology was also second for a second opinion to [facility] as well.  Path report over there suggested that this was a T3a tumor FIGO stage IIIa disease  Interval history-she has some neuropathy in her fingertips but is otherwise tolerating treatments well.  She does have fatigue which last for about a week after chemotherapy  ECOG PS- 1 Pain scale- 3   Review of systems- Review of Systems  Constitutional:  Positive for malaise/fatigue. Negative for chills, fever and weight loss.  HENT:  Negative for congestion, ear discharge and nosebleeds.   Eyes:  Negative for blurred vision.  Respiratory:  Negative for cough, hemoptysis, sputum production, shortness of breath and wheezing.   Cardiovascular:  Negative for chest pain, palpitations, orthopnea and claudication.  Gastrointestinal:  Negative for abdominal pain, blood in stool, constipation, diarrhea, heartburn, melena, nausea and vomiting.  Genitourinary:  Negative for dysuria, flank pain, frequency, hematuria and urgency.  Musculoskeletal:  Negative for back pain, joint pain and myalgias.  Skin:  Negative for rash.  Neurological:  Positive for sensory change (Peripheral neuropathy). Negative for dizziness, tingling, focal weakness, seizures, weakness and headaches.  Endo/Heme/Allergies:  Does not bruise/bleed easily.  Psychiatric/Behavioral:  Negative for depression and suicidal ideas. The patient does not have insomnia.       Allergies  Allergen Reactions   Azithromycin Other (See Comments)    Gets  a bad yeast infection from this.     Past Medical History:  Diagnosis Date   Allergic rhinitis due to pollen     Endometrial cancer ([place])    Hyperlipidemia LDL goal <100 02/16/2020   Hypertension 12/19/2016     Past Surgical History:  Procedure Laterality Date   CESAREAN SECTION     COLONOSCOPY WITH PROPOFOL N/A 11/12/2016   Procedure: COLONOSCOPY WITH PROPOFOL;  Surgeon: Lucilla Lame, MD;  Location: [place];  Service: Endoscopy;  Laterality: N/A;   COLONOSCOPY WITH PROPOFOL N/A 03/29/2017   Procedure: COLONOSCOPY WITH PROPOFOL;  Surgeon: Lucilla Lame, MD;  Location: [place];  Service: Endoscopy;  Laterality: N/A;   COLONOSCOPY WITH PROPOFOL N/A 03/14/2020   Procedure: COLONOSCOPY WITH PROPOFOL;  Surgeon: Lucilla Lame, MD;  Location: [place];  Service: Endoscopy;  Laterality: N/A;  priority 4   IR IMAGING GUIDED PORT INSERTION  06/01/2022   POLYPECTOMY  11/12/2016   Procedure: POLYPECTOMY;  Surgeon: Lucilla Lame, MD;  Location: [place];  Service: Endoscopy;;   POLYPECTOMY  03/29/2017   Procedure: POLYPECTOMY;  Surgeon: Lucilla Lame, MD;  Location: [place];  Service: Endoscopy;;    Social History   Socioeconomic History   Marital status: Married    Spouse name: Not on file   Number of children: Not on file   Years of education: Not on file   Highest education level: Not on file  Occupational History   Not on file  Tobacco Use   Smoking status: Former    Types: Cigarettes    Quit date: 10/22/1978    Years since quitting: 43.9   Smokeless tobacco: Never  Vaping Use   Vaping Use: Never used  Substance and Sexual Activity   Alcohol use: No   Drug use: No   Sexual activity: Yes    Partners: Male  Other Topics Concern   Not on file  Social History Narrative   Not on file   Social Determinants of Health   Financial Resource Strain: Low Risk  (06/22/2022)   Overall Financial Resource Strain (CARDIA)    Difficulty of Paying Living Expenses: Not very hard  Food Insecurity: No Food Insecurity (06/22/2022)   Hunger Vital Sign    Worried  About Running Out of Food in the Last Year: Never true    Ran Out of Food in the Last Year: Never true  Transportation Needs: No Transportation Needs (06/22/2022)   PRAPARE - Hydrologist (Medical): No    Lack of Transportation (Non-Medical): No  Physical Activity: Inactive (06/22/2022)   Exercise Vital Sign    Days of Exercise per Week: 0 days    Minutes of Exercise per Session: 0 min  Stress: Stress Concern Present (06/22/2022)   [place]    Feeling of Stress : To some extent  Social Connections: Socially Integrated (06/22/2022)   Social Connection and Isolation Panel [NHANES]    Frequency of Communication with Friends and Family: Three times a week    Frequency of Social Gatherings with Friends and Family: Three times a week    Attends Religious Services: 1 to 4 times per year    Active Member of Clubs or Organizations: No    Attends Archivist Meetings: 1 to 4 times per year    Marital Status: Married  Human resources officer Violence: Not At Risk (06/22/2022)   Humiliation, Afraid, Rape, and Kick questionnaire  Fear of Current or Ex-Partner: No    Emotionally Abused: No    Physically Abused: No    Sexually Abused: No    Family History  Problem Relation Age of Onset   Hepatitis C Mother 71       d/c at 15   Hypertension Father    Hepatitis C Brother    Breast cancer Neg Hx      Current Outpatient Medications:    amLODipine (NORVASC) 5 MG tablet, Take 1 tablet (5 mg total) by mouth daily., Disp: 30 tablet, Rfl: 3   diazepam (VALIUM) 5 MG tablet, TAKE 1 TABLET(5 MG) BY MOUTH EVERY 6 HOURS AS NEEDED FOR ANXIETY, Disp: 30 tablet, Rfl: 3   Lidocaine-Hydrocort, Perianal, 3-0.5 % CREA, Apply topically., Disp: , Rfl:    lidocaine-prilocaine (EMLA) cream, Apply to affected area once, Disp: 30 g, Rfl: 3   lisinopril (ZESTRIL) 40 MG tablet, Take 1 tablet (40 mg total) by mouth daily., Disp:  30 tablet, Rfl: 3   Multiple Vitamins-Minerals (MULTIVITAMIN GUMMIES ADULTS PO), Take by mouth daily., Disp: , Rfl:    oxyCODONE (OXY IR/ROXICODONE) 5 MG immediate release tablet, Take 1 tablet (5 mg total) by mouth every 8 (eight) hours., Disp: 20 tablet, Rfl: 0   prochlorperazine (COMPAZINE) 10 MG tablet, Take 1 tablet (10 mg total) by mouth every 6 (six) hours as needed for nausea or vomiting., Disp: 60 tablet, Rfl: 2   SYSTANE COMPLETE 0.6 % SOLN, Apply 1 drop to eye daily., Disp: , Rfl:    traMADol (ULTRAM) 50 MG tablet, TAKE 1 TABLET(50 MG) BY MOUTH DAILY, Disp: 30 tablet, Rfl: 3   ondansetron (ZOFRAN) 8 MG tablet, Take 1 tablet (8 mg total) by mouth every 8 (eight) hours as needed for nausea or vomiting. (Patient not taking: Reported on 09/17/2022), Disp: 60 tablet, Rfl: 2   ondansetron (ZOFRAN-ODT) 8 MG disintegrating tablet, Take 8 mg by mouth every 8 (eight) hours as needed. (Patient not taking: Reported on 09/17/2022), Disp: , Rfl:    rosuvastatin (CRESTOR) 10 MG tablet, Take 1 tablet (10 mg total) by mouth daily. (Patient not taking: Reported on 09/17/2022), Disp: 90 tablet, Rfl: 3 No current facility-administered medications for this visit.  Facility-Administered Medications Ordered in Other Visits:    CARBOplatin (PARAPLATIN) 500 mg in sodium chloride 0.9 % 250 mL chemo infusion, 500 mg, Intravenous, Once, Sindy Guadeloupe, MD   heparin lock flush 100 unit/mL, 500 Units, Intracatheter, Once PRN, Sindy Guadeloupe, MD   PACLitaxel (TAXOL) 294 mg in sodium chloride 0.9 % 250 mL chemo infusion (> '80mg'$ /m2), 175 mg/m2 (Treatment Plan Recorded), Intravenous, Once, Sindy Guadeloupe, MD, Last Rate: 100 mL/hr at 10/08/22 1226, 294 mg at 10/08/22 1226  Physical exam:  Vitals:   10/08/22 1014  BP: (!) 143/77  Pulse: 72  Temp: 98.4 F (36.9 C)  TempSrc: Tympanic  Weight: 149 lb (67.6 kg)   Physical Exam Cardiovascular:     Rate and Rhythm: Normal rate and regular rhythm.     Heart sounds:  Normal heart sounds.  Pulmonary:     Effort: Pulmonary effort is normal.     Breath sounds: Normal breath sounds.  Abdominal:     General: Bowel sounds are normal.     Palpations: Abdomen is soft.  Skin:    General: Skin is warm and dry.  Neurological:     Mental Status: She is alert and oriented to person, place, and time.  Latest Ref Rng & Units 10/08/2022    9:49 AM  CMP  Glucose 70 - 99 mg/dL 102   BUN 8 - 23 mg/dL 15   Creatinine 0.44 - 1.00 mg/dL 0.67   Sodium 135 - 145 mmol/L 140   Potassium 3.5 - 5.1 mmol/L 3.5   Chloride 98 - 111 mmol/L 104   CO2 22 - 32 mmol/L 26   Calcium 8.9 - 10.3 mg/dL 8.9   Total Protein 6.5 - 8.1 g/dL 7.1   Total Bilirubin 0.3 - 1.2 mg/dL 0.6   Alkaline Phos 38 - 126 U/L 72   AST 15 - 41 U/L 30   ALT 0 - 44 U/L 37       Latest Ref Rng & Units 10/08/2022    9:49 AM  CBC  WBC 4.0 - 10.5 K/uL 3.9   Hemoglobin 12.0 - 15.0 g/dL 11.7   Hematocrit 36.0 - 46.0 % 33.8   Platelets 150 - 400 K/uL 137     Assessment and plan- Patient is a 61 y.o. female with FIGO stage Ib endometrioid endometrial cancer pT1b N0 M0.  She is here for on treatment assessment prior to cycle 6 of CarboTaxol chemotherapy  Counts okay to proceed with cycle 6 of CarboTaxol chemotherapy today with Udenyca on day 3.  This will be her last chemo.  She will be proceeding with vaginal brachytherapy following this.  I will see her back in 3 months with CBC with differential CMP CA125 and CT chest abdomen and pelvis with contrast prior.  Chemo-induced peripheral neuropathy: Mild grade 1.  Continue to monitor   Visit Diagnosis 1. Encounter for antineoplastic chemotherapy   2. Endometrial carcinoma ([place])   3. Chemotherapy-induced peripheral neuropathy ([facility])      Dr. Randa Evens, MD, MPH [facility] at [facility] [phone] 10/08/2022 1:48 PM

## 2022-10-08 NOTE — Patient Instructions (Signed)
[facility]  Discharge Instructions: Thank you for choosing [facility] to provide your oncology and hematology care.  If you have a lab appointment with the Cancer Center, please go directly to the Cancer Center and check in at the registration area.  Wear comfortable clothing and clothing appropriate for easy access to any Portacath or PICC line.   We strive to give you quality time with your provider. You may need to reschedule your appointment if you arrive late (15 or more minutes).  Arriving late affects you and other patients whose appointments are after yours.  Also, if you miss three or more appointments without notifying the office, you may be dismissed from the clinic at the provider's discretion.      For prescription refill requests, have your pharmacy contact our office and allow 72 hours for refills to be completed.    Today you received the following chemotherapy and/or immunotherapy agents taxol, carboplatin      To help prevent nausea and vomiting after your treatment, we encourage you to take your nausea medication as directed.  BELOW ARE SYMPTOMS THAT SHOULD BE REPORTED IMMEDIATELY: *FEVER GREATER THAN 100.4 F (38 C) OR HIGHER *CHILLS OR SWEATING *NAUSEA AND VOMITING THAT IS NOT CONTROLLED WITH YOUR NAUSEA MEDICATION *UNUSUAL SHORTNESS OF BREATH *UNUSUAL BRUISING OR BLEEDING *URINARY PROBLEMS (pain or burning when urinating, or frequent urination) *BOWEL PROBLEMS (unusual diarrhea, constipation, pain near the anus) TENDERNESS IN MOUTH AND THROAT WITH OR WITHOUT PRESENCE OF ULCERS (sore throat, sores in mouth, or a toothache) UNUSUAL RASH, SWELLING OR PAIN  UNUSUAL VAGINAL DISCHARGE OR ITCHING   Items with * indicate a potential emergency and should be followed up as soon as possible or go to the Emergency Department if any problems should occur.  Please show the CHEMOTHERAPY ALERT CARD or IMMUNOTHERAPY ALERT CARD at  check-in to the Emergency Department and triage nurse.  Should you have questions after your visit or need to cancel or reschedule your appointment, please contact [facility]  [phone] and follow the prompts.  Office hours are 8:00 a.m. to 4:30 p.m. Monday - Friday. Please note that voicemails left after 4:00 p.m. may not be returned until the following business day.  We are closed weekends and major holidays. You have access to a nurse at all times for urgent questions. Please call the main number to the clinic [phone] and follow the prompts.  For any non-urgent questions, you may also contact your provider using MyChart. We now offer e-Visits for anyone 18 and older to request care online for non-urgent symptoms. For details visit [email or web address].   Also download the MyChart app! Go to the app store, search "MyChart", open the app, select [facility], and log in with your MyChart username and password.  Masks are optional in the cancer centers. If you would like for your care team to wear a mask while they are taking care of you, please let them know. For doctor visits, patients may have with them one support person who is at least 62 years old. At this time, visitors are not allowed in the infusion area.   

## 2022-10-09 ENCOUNTER — Other Ambulatory Visit: Payer: Self-pay

## 2022-10-10 ENCOUNTER — Inpatient Hospital Stay: Payer: Self-pay

## 2022-10-10 ENCOUNTER — Other Ambulatory Visit: Payer: Self-pay

## 2022-10-10 DIAGNOSIS — C541 Malignant neoplasm of endometrium: Secondary | ICD-10-CM

## 2022-10-10 MED ORDER — PEGFILGRASTIM-CBQV 6 MG/0.6ML ~~LOC~~ SOSY
6.0000 mg | PREFILLED_SYRINGE | Freq: Once | SUBCUTANEOUS | Status: AC
  Administered 2022-10-10: 6 mg via SUBCUTANEOUS
  Filled 2022-10-10: qty 0.6

## 2022-10-11 ENCOUNTER — Ambulatory Visit: Payer: Self-pay

## 2022-10-16 ENCOUNTER — Ambulatory Visit: Payer: Self-pay

## 2022-10-16 ENCOUNTER — Ambulatory Visit
Admission: RE | Admit: 2022-10-16 | Discharge: 2022-10-16 | Disposition: A | Discharge status: Home / Self Care | Payer: Self-pay | Source: Ambulatory Visit | Attending: Radiation Oncology | Admitting: Radiation Oncology

## 2022-10-16 ENCOUNTER — Other Ambulatory Visit: Payer: Self-pay

## 2022-10-16 DIAGNOSIS — C541 Malignant neoplasm of endometrium: Secondary | ICD-10-CM | POA: Insufficient documentation

## 2022-10-16 LAB — RAD ONC ARIA SESSION SUMMARY
Course Elapsed Days: 0
Plan Fractions Treated to Date: 1
Plan Prescribed Dose Per Fraction: 4.7 Gy
Plan Total Fractions Prescribed: 5
Plan Total Prescribed Dose: 23.5 Gy
Reference Point Dosage Given to Date: 4.7 Gy
Reference Point Session Dosage Given: 4.7 Gy
Session Number: 1

## 2022-10-18 ENCOUNTER — Other Ambulatory Visit: Payer: Self-pay

## 2022-10-18 ENCOUNTER — Ambulatory Visit
Admission: RE | Admit: 2022-10-18 | Discharge: 2022-10-18 | Disposition: A | Discharge status: Home / Self Care | Payer: Self-pay | Source: Ambulatory Visit | Attending: Radiation Oncology | Admitting: Radiation Oncology

## 2022-10-18 LAB — RAD ONC ARIA SESSION SUMMARY
Course Elapsed Days: 2
Plan Fractions Treated to Date: 2
Plan Prescribed Dose Per Fraction: 4.7 Gy
Plan Total Fractions Prescribed: 5
Plan Total Prescribed Dose: 23.5 Gy
Reference Point Dosage Given to Date: 9.4 Gy
Reference Point Session Dosage Given: 4.7 Gy
Session Number: 2

## 2022-10-22 DIAGNOSIS — C73 Malignant neoplasm of thyroid gland: Secondary | ICD-10-CM

## 2022-10-22 HISTORY — DX: Malignant neoplasm of thyroid gland: C73

## 2022-10-23 ENCOUNTER — Ambulatory Visit
Admission: RE | Admit: 2022-10-23 | Discharge: 2022-10-23 | Disposition: A | Discharge status: Home / Self Care | Payer: Self-pay | Source: Ambulatory Visit | Attending: Radiation Oncology | Admitting: Radiation Oncology

## 2022-10-23 ENCOUNTER — Other Ambulatory Visit: Payer: Self-pay

## 2022-10-23 DIAGNOSIS — C541 Malignant neoplasm of endometrium: Secondary | ICD-10-CM | POA: Insufficient documentation

## 2022-10-23 LAB — RAD ONC ARIA SESSION SUMMARY
Course Elapsed Days: 7
Plan Fractions Treated to Date: 3
Plan Prescribed Dose Per Fraction: 4.7 Gy
Plan Total Fractions Prescribed: 5
Plan Total Prescribed Dose: 23.5 Gy
Reference Point Dosage Given to Date: 14.1 Gy
Reference Point Session Dosage Given: 4.7 Gy
Session Number: 3

## 2022-10-25 ENCOUNTER — Ambulatory Visit
Admission: RE | Admit: 2022-10-25 | Discharge: 2022-10-25 | Disposition: A | Discharge status: Home / Self Care | Payer: Self-pay | Source: Ambulatory Visit | Attending: Radiation Oncology | Admitting: Radiation Oncology

## 2022-10-25 ENCOUNTER — Other Ambulatory Visit: Payer: Self-pay

## 2022-10-25 LAB — RAD ONC ARIA SESSION SUMMARY
Course Elapsed Days: 9
Plan Fractions Treated to Date: 4
Plan Prescribed Dose Per Fraction: 4.7 Gy
Plan Total Fractions Prescribed: 5
Plan Total Prescribed Dose: 23.5 Gy
Reference Point Dosage Given to Date: 18.8 Gy
Reference Point Session Dosage Given: 4.7 Gy
Session Number: 4

## 2022-10-29 ENCOUNTER — Ambulatory Visit
Admission: RE | Admit: 2022-10-29 | Discharge: 2022-10-29 | Disposition: A | Discharge status: Home / Self Care | Payer: Self-pay | Source: Ambulatory Visit | Attending: Radiation Oncology | Admitting: Radiation Oncology

## 2022-10-29 ENCOUNTER — Other Ambulatory Visit: Payer: Self-pay

## 2022-10-29 LAB — RAD ONC ARIA SESSION SUMMARY
Course Elapsed Days: 13
Plan Fractions Treated to Date: 5
Plan Prescribed Dose Per Fraction: 4.7 Gy
Plan Total Fractions Prescribed: 5
Plan Total Prescribed Dose: 23.5 Gy
Reference Point Dosage Given to Date: 23.5 Gy
Reference Point Session Dosage Given: 4.7 Gy
Session Number: 5

## 2022-11-06 ENCOUNTER — Telehealth: Payer: Self-pay | Admitting: *Deleted

## 2022-11-06 DIAGNOSIS — C541 Malignant neoplasm of endometrium: Secondary | ICD-10-CM

## 2022-11-06 DIAGNOSIS — R531 Weakness: Secondary | ICD-10-CM

## 2022-11-06 NOTE — Telephone Encounter (Signed)
Patient called reporting that she feels she is just not recovering since completing her chemotherapy and radiation therapy. She feels her legs are like jello and she has neuropathy in her hands. She does not feel that she needs to come in to be evaluated, but would like to be referred to the CARE Program to build her strength up. Please advise

## 2022-11-06 NOTE — Telephone Encounter (Signed)
Call to patient to inform of referral to CARE program and also to [place] in our office. She is agreeable with both

## 2022-11-06 NOTE — Telephone Encounter (Signed)
Hassan Rowan- care program referral entered as well as apts with Gwenette Greet. Will you call patient and let her know the plan.  Anderson Malta- will you enter apts for [facility]. Thanks

## 2022-11-14 ENCOUNTER — Inpatient Hospital Stay: Payer: Self-pay | Attending: Oncology | Admitting: Occupational Therapy

## 2022-11-14 DIAGNOSIS — G620 Drug-induced polyneuropathy: Secondary | ICD-10-CM | POA: Insufficient documentation

## 2022-11-14 DIAGNOSIS — Z87891 Personal history of nicotine dependence: Secondary | ICD-10-CM | POA: Insufficient documentation

## 2022-11-14 DIAGNOSIS — R530 Neoplastic (malignant) related fatigue: Secondary | ICD-10-CM | POA: Insufficient documentation

## 2022-11-14 DIAGNOSIS — R531 Weakness: Secondary | ICD-10-CM

## 2022-11-14 DIAGNOSIS — T451X5A Adverse effect of antineoplastic and immunosuppressive drugs, initial encounter: Secondary | ICD-10-CM | POA: Insufficient documentation

## 2022-11-14 DIAGNOSIS — Z79899 Other long term (current) drug therapy: Secondary | ICD-10-CM | POA: Insufficient documentation

## 2022-11-14 DIAGNOSIS — C541 Malignant neoplasm of endometrium: Secondary | ICD-10-CM | POA: Insufficient documentation

## 2022-11-14 NOTE — Therapy (Signed)
[place] at [facility] [address] [place], [place], [address] Phone: [phone]   Fax:  [phone]  Occupational Therapy Screen:  Patient Details  Name: Jaime Johnson MRN: [id number] Date of Birth: November 21, 1960 No data recorded  Encounter Date: 11/14/2022   OT End of Session - 11/14/22 1818     Visit Number 0             Past Medical History:  Diagnosis Date   Allergic rhinitis due to pollen    Endometrial cancer ([place])    Hyperlipidemia LDL goal <100 02/16/2020   Hypertension 12/19/2016    Past Surgical History:  Procedure Laterality Date   CESAREAN SECTION     COLONOSCOPY WITH PROPOFOL N/A 11/12/2016   Procedure: COLONOSCOPY WITH PROPOFOL;  Surgeon: Lucilla Lame, MD;  Location: [place];  Service: Endoscopy;  Laterality: N/A;   COLONOSCOPY WITH PROPOFOL N/A 03/29/2017   Procedure: COLONOSCOPY WITH PROPOFOL;  Surgeon: Lucilla Lame, MD;  Location: [place];  Service: Endoscopy;  Laterality: N/A;   COLONOSCOPY WITH PROPOFOL N/A 03/14/2020   Procedure: COLONOSCOPY WITH PROPOFOL;  Surgeon: Lucilla Lame, MD;  Location: [place];  Service: Endoscopy;  Laterality: N/A;  priority 4   IR IMAGING GUIDED PORT INSERTION  06/01/2022   POLYPECTOMY  11/12/2016   Procedure: POLYPECTOMY;  Surgeon: Lucilla Lame, MD;  Location: [place];  Service: Endoscopy;;   POLYPECTOMY  03/29/2017   Procedure: POLYPECTOMY;  Surgeon: Lucilla Lame, MD;  Location: [place];  Service: Endoscopy;;    There were no vitals filed for this visit.   Subjective Assessment - 11/14/22 1815     Subjective  My husband to cardiac rehab and they were telling me that the care program.  I am just not as active can get tired easily.  I have 5 grandkids and I used to work in the garden.  My back bothers me a little bit more and then neuropathy in my feet and my hands.    Currently in Pain? Yes    Pain Score --   Number  provided   Pain Location Back    Pain Orientation Left              Dr Elroy Channel note : Assessment and plan- Patient is a 62 y.o. female with FIGO stage Ib endometrioid endometrial cancer pT1b N0 M0.  She is here for on treatment assessment prior to cycle 6 of CarboTaxol chemotherapy   Counts okay to proceed with cycle 6 of CarboTaxol chemotherapy today with Udenyca on day 3.  This will be her last chemo.  She will be proceeding with vaginal brachytherapy following this.  I will see her back in 3 months with CBC with differential CMP CA125 and CT chest abdomen and pelvis with contrast prior.   Chemo-induced peripheral neuropathy: Mild grade 1.  Continue to monitor   Visit Diagnosis 1. Encounter for antineoplastic chemotherapy   2. Endometrial carcinoma ([place])   3. Chemotherapy-induced peripheral neuropathy ([place])         Dr. Randa Evens, MD, MPH [facility] at [facility] [phone] 10/08/2022 1:48 PM    OT SCREEN 11/14/22:  Patient present with reports of increased fatigue and neuropathy in feet and hands.  Patient not as active as she used to be before her cancer diagnosis.  Denies any falls but did had some close falls. Patient does have a tub shower with a seat.  She does  goes out and do groceries but hold onto the cart.  2 stairs to get into the house.  Have a two-story house but bedroom and bathroom is downstairs. Patient interested in doing the CARE program. Patient with some weakness in hips and knees.  With stiffness tightness in the ankles because of neuropathy. Due to with patient BERG balance test - score 52/56 - low risk for falling  Provided patient with some calf stretches, ankle range of motion; sit and stand from a raised seat and sidestepping at the kitchen counter. Patient can benefit from the CARE exercise program Patient also interested in the cancer transition classes patient's name provided for  [facility].                                     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

## 2022-11-19 ENCOUNTER — Inpatient Hospital Stay: Payer: Self-pay

## 2022-11-19 ENCOUNTER — Inpatient Hospital Stay (HOSPITAL_BASED_OUTPATIENT_CLINIC_OR_DEPARTMENT_OTHER): Payer: Self-pay | Admitting: Nurse Practitioner

## 2022-11-19 ENCOUNTER — Other Ambulatory Visit: Payer: Self-pay | Admitting: *Deleted

## 2022-11-19 ENCOUNTER — Encounter: Payer: Self-pay | Admitting: Nurse Practitioner

## 2022-11-19 ENCOUNTER — Telehealth: Payer: Self-pay | Admitting: *Deleted

## 2022-11-19 VITALS — BP 148/78 | HR 70 | Temp 97.3°F | Wt 149.0 lb

## 2022-11-19 DIAGNOSIS — T451X5A Adverse effect of antineoplastic and immunosuppressive drugs, initial encounter: Secondary | ICD-10-CM

## 2022-11-19 DIAGNOSIS — Z5189 Encounter for other specified aftercare: Secondary | ICD-10-CM

## 2022-11-19 DIAGNOSIS — R531 Weakness: Secondary | ICD-10-CM

## 2022-11-19 DIAGNOSIS — C541 Malignant neoplasm of endometrium: Secondary | ICD-10-CM

## 2022-11-19 DIAGNOSIS — Z95828 Presence of other vascular implants and grafts: Secondary | ICD-10-CM

## 2022-11-19 DIAGNOSIS — G620 Drug-induced polyneuropathy: Secondary | ICD-10-CM

## 2022-11-19 LAB — COMPREHENSIVE METABOLIC PANEL
ALT: 39 U/L (ref 0–44)
AST: 29 U/L (ref 15–41)
Albumin: 4.6 g/dL (ref 3.5–5.0)
Alkaline Phosphatase: 62 U/L (ref 38–126)
Anion gap: 8 (ref 5–15)
BUN: 13 mg/dL (ref 8–23)
CO2: 25 mmol/L (ref 22–32)
Calcium: 9 mg/dL (ref 8.9–10.3)
Chloride: 105 mmol/L (ref 98–111)
Creatinine, Ser: 0.73 mg/dL (ref 0.44–1.00)
GFR, Estimated: >60 mL/min (ref >60)
Glucose, Bld: 106 mg/dL — ABNORMAL HIGH (ref 70–99)
Potassium: 3.6 mmol/L (ref 3.5–5.1)
Sodium: 138 mmol/L (ref 135–145)
Total Bilirubin: 0.7 mg/dL (ref 0.3–1.2)
Total Protein: 7.6 g/dL (ref 6.5–8.1)

## 2022-11-19 LAB — CBC WITH DIFFERENTIAL/PLATELET
Abs Immature Granulocytes: 0.01 10*3/uL (ref 0.00–0.07)
Basophils Absolute: 0 10*3/uL (ref 0.0–0.1)
Basophils Relative: 1 %
Eosinophils Absolute: 0 10*3/uL (ref 0.0–0.5)
Eosinophils Relative: 1 %
HCT: 37.1 % (ref 36.0–46.0)
Hemoglobin: 12.7 g/dL (ref 12.0–15.0)
Immature Granulocytes: 0 %
Lymphocytes Relative: 34 %
Lymphs Abs: 1.1 10*3/uL (ref 0.7–4.0)
MCH: 29.7 pg (ref 26.0–34.0)
MCHC: 34.2 g/dL (ref 30.0–36.0)
MCV: 86.9 fL (ref 80.0–100.0)
Monocytes Absolute: 0.2 10*3/uL (ref 0.1–1.0)
Monocytes Relative: 7 %
Neutro Abs: 1.9 10*3/uL (ref 1.7–7.7)
Neutrophils Relative %: 57 %
Platelets: 125 10*3/uL — ABNORMAL LOW (ref 150–400)
RBC: 4.27 MIL/uL (ref 3.87–5.11)
RDW: 12.8 % (ref 11.5–15.5)
WBC: 3.3 10*3/uL — ABNORMAL LOW (ref 4.0–10.5)
nRBC: 0 % (ref 0.0–0.2)

## 2022-11-19 MED ORDER — DULOXETINE HCL 30 MG PO CPEP: ORAL_CAPSULE | ORAL | 0 refills | Status: DC

## 2022-11-19 MED ORDER — HEPARIN SOD (PORK) LOCK FLUSH 100 UNIT/ML IV SOLN
500.0000 [IU] | Freq: Once | INTRAVENOUS | Status: AC
  Administered 2022-11-19: 500 [IU] via INTRAVENOUS
  Filled 2022-11-19: qty 5

## 2022-11-19 MED ORDER — SODIUM CHLORIDE 0.9% FLUSH
10.0000 mL | Freq: Once | INTRAVENOUS | Status: AC
  Administered 2022-11-19: 10 mL via INTRAVENOUS
  Filled 2022-11-19: qty 10

## 2022-11-19 NOTE — Progress Notes (Addendum)
Completed CARE phone call with patient.   Reviewed medications, medical history and answered all of patient's questions. There was some unknown on medications and I advised her to call her doctor today to clarify her medications. She states she also has been experiencing some swelling in her legs which she is seeing a nurse for today.  She will ask them if she has any restrictions in starting the CARE program or if she needs to hold off on starting due to any limitations. Tentative appointment for CARE orientation tomorrow, 1/30 at 12:15 PM. Will reschedule appt if MD says otherwise.  Update 1/29 1534: Patient saw L. Zenia Resides, NP who told patient she would like her to wait to complete the CARE program until she completes her CT scan on 3/15, follow up on 3/19. Patient will call us if anything changes and will leave referral open at this time.

## 2022-11-19 NOTE — Telephone Encounter (Signed)
Patient called reporting that she is having swelling in both lower extremities for weeks from mid lower leg to toes. She has seen Gwenette Greet who has given her some exercises but she feels it is getting worse. She reports that her neuropathy is getting worse also. She is concerned. She is also reports that she has little red dots on both lower legs. Her last lab check was when she had treatment on 10/09/22. Her next appointment is with GYN on 11/28/22 and Dr Janese Banks on 01/08/23. Please advise

## 2022-11-19 NOTE — Patient Instructions (Signed)
As we discussed, you may notice some improvement in your symptoms with acupuncture. Below is the names of some local clinics that offer our cancer survivors a discounted rate of $40/session. You can contact them for an appointment at your convenience.  - [place]) or [phone] [facility])   I've sent a prescription for duloxetine for your symptoms as well.

## 2022-11-19 NOTE — Progress Notes (Signed)
Symptom Management [place] [facility] [place]. [facility] [address], [place], [place] [address] [phone] (phone) [phone] (fax)  Patient Care Team: Owens Loffler, MD as PCP - General (Family Medicine) Rico Junker, RN as Registered Nurse Theodore Demark, RN (Inactive) as Registered Nurse Clent Jacks, RN as Oncology Nurse Navigator   Name of the patient: Jaime Johnson  [id number]  [birth year]   Date of visit: 11/19/22  Diagnosis- Endometrial Cancer  Chief complaint/ Reason for visit- leg swelling, foot numbness & pain  Heme/Onc history:  Oncology History  Endometrial carcinoma ([place])  03/09/2022 Initial Diagnosis   Endometrial carcinoma ([place])   05/22/2022 Cancer Staging   Staging form: Corpus Uteri - Carcinoma and Carcinosarcoma, AJCC 8th Edition - Pathologic stage from 05/22/2022: FIGO Stage IB (pT1b, pN0(i+), cM0) - Signed by Lloyd Huger, MD on 06/07/2022 Histologic grade (G): G2 Histologic grading system: 3 grade system Residual tumor (R): R0 - None   06/14/2022 -  Chemotherapy   Patient is on Treatment Plan : UTERINE Carboplatin AUC 6 + Paclitaxel q21d       Interval history- Patient is 62 year old female diagnosed with endometrial cancer, s/p 6 cycles of carbo-taxol completed 10/08/22, and vaginal brachytherapy completed 10/29/22, who presents to clinic for complaints of leg swelling and numbness and pain of toes/feet. She also complains of fatigue since completing treatment. She has enrolled in CARE program and is hoping exercise improves her symptoms but she is worried about returning to her pre-treatment state of health. Describes both numbness and pain of her feet. Tried cold packs with last treatment. Previously took gabapentin and lyrica for her symptoms but was unable to tolerate d/t side effects. Compression leggings have helped somewhat.   ECOG FS:2 -  Symptomatic, <50% confined to bed  Review of systems- Review of Systems  Constitutional:  Positive for malaise/fatigue. Negative for chills, fever and weight loss.  HENT:  Negative for hearing loss, nosebleeds, sore throat and tinnitus.   Eyes:  Negative for blurred vision and double vision.  Respiratory:  Negative for cough, hemoptysis, shortness of breath and wheezing.   Cardiovascular:  Positive for leg swelling. Negative for chest pain and palpitations.  Gastrointestinal:  Negative for abdominal pain, blood in stool, constipation, diarrhea, melena, nausea and vomiting.  Genitourinary:  Negative for dysuria and urgency.  Musculoskeletal:  Negative for back pain, falls, joint pain and myalgias.  Skin:  Negative for itching and rash.  Neurological:  Positive for tingling and sensory change. Negative for dizziness, loss of consciousness, weakness and headaches.  Endo/Heme/Allergies:  Negative for environmental allergies. Does not bruise/bleed easily.  Psychiatric/Behavioral:  Negative for depression. The patient is not nervous/anxious and does not have insomnia.      Allergies  Allergen Reactions   Azithromycin Other (See Comments)    Gets a bad yeast infection from this.    Past Medical History:  Diagnosis Date   Allergic rhinitis due to pollen    Endometrial cancer ([place])    Hyperlipidemia LDL goal <100 02/16/2020   Hypertension 12/19/2016    Past Surgical History:  Procedure Laterality Date   CESAREAN SECTION     COLONOSCOPY WITH PROPOFOL N/A 11/12/2016   Procedure: COLONOSCOPY WITH PROPOFOL;  Surgeon: Lucilla Lame, MD;  Location: [facility];  Service: Endoscopy;  Laterality: N/A;   COLONOSCOPY WITH PROPOFOL N/A 03/29/2017   Procedure: COLONOSCOPY WITH PROPOFOL;  Surgeon: Anyjah Roundtree Norris,  Darren, MD;  Location: [facility];  Service: Endoscopy;  Laterality: N/A;   COLONOSCOPY WITH PROPOFOL N/A 03/14/2020   Procedure: COLONOSCOPY WITH PROPOFOL;  Surgeon: Lucilla Lame, MD;   Location: [facility];  Service: Endoscopy;  Laterality: N/A;  priority 4   IR IMAGING GUIDED PORT INSERTION  06/01/2022   POLYPECTOMY  11/12/2016   Procedure: POLYPECTOMY;  Surgeon: Lucilla Lame, MD;  Location: [facility];  Service: Endoscopy;;   POLYPECTOMY  03/29/2017   Procedure: POLYPECTOMY;  Surgeon: Lucilla Lame, MD;  Location: [place];  Service: Endoscopy;;    Social History   Socioeconomic History   Marital status: Married    Spouse name: Not on file   Number of children: Not on file   Years of education: Not on file   Highest education level: Not on file  Occupational History   Not on file  Tobacco Use   Smoking status: Former    Types: Cigarettes    Quit date: 10/22/1978    Years since quitting: 44.1   Smokeless tobacco: Never  Vaping Use   Vaping Use: Never used  Substance and Sexual Activity   Alcohol use: No   Drug use: No   Sexual activity: Yes    Partners: Male  Other Topics Concern   Not on file  Social History Narrative   Not on file   Social Determinants of Health   Financial Resource Strain: Low Risk  (06/22/2022)   Overall Financial Resource Strain (CARDIA)    Difficulty of Paying Living Expenses: Not very hard  Food Insecurity: No Food Insecurity (06/22/2022)   Hunger Vital Sign    Worried About Running Out of Food in the Last Year: Never true    Ran Out of Food in the Last Year: Never true  Transportation Needs: No Transportation Needs (06/22/2022)   PRAPARE - Hydrologist (Medical): No    Lack of Transportation (Non-Medical): No  Physical Activity: Inactive (06/22/2022)   Exercise Vital Sign    Days of Exercise per Week: 0 days    Minutes of Exercise per Session: 0 min  Stress: Stress Concern Present (06/22/2022)   [place]    Feeling of Stress : To some extent  Social Connections: Socially Integrated (06/22/2022)   Social  Connection and Isolation Panel [NHANES]    Frequency of Communication with Friends and Family: Three times a week    Frequency of Social Gatherings with Friends and Family: Three times a week    Attends Religious Services: 1 to 4 times per year    Active Member of Clubs or Organizations: No    Attends Archivist Meetings: 1 to 4 times per year    Marital Status: Married  Human resources officer Violence: Not At Risk (06/22/2022)   Humiliation, Afraid, Rape, and Kick questionnaire    Fear of Current or Ex-Partner: No    Emotionally Abused: No    Physically Abused: No    Sexually Abused: No    Family History  Problem Relation Age of Onset   Hepatitis C Mother 52       d/c at 27   Hypertension Father    Hepatitis C Brother    Breast cancer Neg Hx      Current Outpatient Medications:    SYSTANE COMPLETE 0.6 % SOLN, Apply 1 drop to eye daily., Disp: , Rfl:    traMADol (ULTRAM) 50 MG tablet, TAKE  1 TABLET(50 MG) BY MOUTH DAILY, Disp: 30 tablet, Rfl: 3   amLODipine (NORVASC) 5 MG tablet, Take 1 tablet (5 mg total) by mouth daily. (Patient not taking: Reported on 11/19/2022), Disp: 30 tablet, Rfl: 3   diazepam (VALIUM) 5 MG tablet, TAKE 1 TABLET(5 MG) BY MOUTH EVERY 6 HOURS AS NEEDED FOR ANXIETY (Patient not taking: Reported on 11/19/2022), Disp: 30 tablet, Rfl: 3   Lidocaine-Hydrocort, Perianal, 3-0.5 % CREA, Apply topically. (Patient not taking: Reported on 11/19/2022), Disp: , Rfl:    lidocaine-prilocaine (EMLA) cream, Apply to affected area once (Patient not taking: Reported on 11/19/2022), Disp: 30 g, Rfl: 3   lisinopril (ZESTRIL) 40 MG tablet, Take 1 tablet (40 mg total) by mouth daily. (Patient not taking: Reported on 11/19/2022), Disp: 30 tablet, Rfl: 3   Multiple Vitamins-Minerals (MULTIVITAMIN GUMMIES ADULTS PO), Take by mouth daily. (Patient not taking: Reported on 11/19/2022), Disp: , Rfl:    ondansetron (ZOFRAN) 8 MG tablet, Take 1 tablet (8 mg total) by mouth every 8 (eight) hours  as needed for nausea or vomiting. (Patient not taking: Reported on 09/17/2022), Disp: 60 tablet, Rfl: 2   ondansetron (ZOFRAN-ODT) 8 MG disintegrating tablet, Take 8 mg by mouth every 8 (eight) hours as needed. (Patient not taking: Reported on 09/17/2022), Disp: , Rfl:    oxyCODONE (OXY IR/ROXICODONE) 5 MG immediate release tablet, Take 1 tablet (5 mg total) by mouth every 8 (eight) hours. (Patient not taking: Reported on 11/19/2022), Disp: 20 tablet, Rfl: 0   prochlorperazine (COMPAZINE) 10 MG tablet, Take 1 tablet (10 mg total) by mouth every 6 (six) hours as needed for nausea or vomiting. (Patient not taking: Reported on 11/19/2022), Disp: 60 tablet, Rfl: 2   rosuvastatin (CRESTOR) 10 MG tablet, Take 1 tablet (10 mg total) by mouth daily. (Patient not taking: Reported on 09/17/2022), Disp: 90 tablet, Rfl: 3  Physical exam:  Vitals:   11/19/22 1341  BP: (!) 148/78  Pulse: 70  Temp: (!) 97.3 F (36.3 C)  TempSrc: Tympanic  Weight: 149 lb (67.6 kg)   Physical Exam Constitutional:      General: She is not in acute distress.    Appearance: She is well-developed. She is not ill-appearing.  HENT:     Head: Atraumatic.     Nose: Nose normal.     Mouth/Throat:     Pharynx: No oropharyngeal exudate.  Eyes:     General: No scleral icterus.    Conjunctiva/sclera: Conjunctivae normal.  Cardiovascular:     Rate and Rhythm: Normal rate and regular rhythm.  Pulmonary:     Effort: Pulmonary effort is normal.     Breath sounds: Normal breath sounds.  Abdominal:     General: Bowel sounds are normal. There is no distension.     Palpations: Abdomen is soft.     Tenderness: There is no abdominal tenderness.  Musculoskeletal:        General: No tenderness or deformity. Normal range of motion.     Cervical back: Normal range of motion and neck supple.     Right lower leg: Edema (nonpitting) present.     Left lower leg: Edema (nonpitting. L > R) present.  Skin:    General: Skin is warm and dry.      Coloration: Skin is not pale.  Neurological:     General: No focal deficit present.     Mental Status: She is alert and oriented to person, place, and time.  Psychiatric:  Mood and Affect: Mood normal.        Behavior: Behavior normal.         Latest Ref Rng & Units 11/19/2022    1:23 PM  CMP  Glucose 70 - 99 mg/dL 106   BUN 8 - 23 mg/dL 13   Creatinine 0.44 - 1.00 mg/dL 0.73   Sodium 135 - 145 mmol/L 138   Potassium 3.5 - 5.1 mmol/L 3.6   Chloride 98 - 111 mmol/L 105   CO2 22 - 32 mmol/L 25   Calcium 8.9 - 10.3 mg/dL 9.0   Total Protein 6.5 - 8.1 g/dL 7.6   Total Bilirubin 0.3 - 1.2 mg/dL 0.7   Alkaline Phos 38 - 126 U/L 62   AST 15 - 41 U/L 29   ALT 0 - 44 U/L 39       Latest Ref Rng & Units 11/19/2022    1:23 PM  CBC  WBC 4.0 - 10.5 K/uL 3.3   Hemoglobin 12.0 - 15.0 g/dL 12.7   Hematocrit 36.0 - 46.0 % 37.1   Platelets 150 - 400 K/uL 125     No results found.  Assessment and plan- Patient is a 62 y.o. female   Endometrial Cancer- s/p 6 cycles carbo-taxol and vaginal brachytherapy. Reviewed that she completed treatment less than a month ago. Suspect her symptoms are related to continued recovery. Reviewed recommendation for ct imaging post treatment to assess response. She has follow up with gyn onc upcoming with imaging scheduled in March.  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 Unable to tolerate gabapentin or pregabalin. Symptoms may gradually improve with time. She's reluctant to try additional medications but is willing to trial low dose duloxetine 30 mg daily x 2 weeks, if tolerating, increase to 60 mg daily. Also provided her with local clinics that offer acupuncture.  Chemotherapy related fatigue- Recommended energy conservation, rest when needed, exercise/activity as tolerated.   Disposition: Phone/virtual follow up in 1 month to reassess tolerance to duloxetine.  Follow up with Gyn-Onc and Med  onc as scheduled RTC in interim as needed- la   Visit Diagnosis 1. Chemotherapy-induced peripheral neuropathy ([place])   2. Convalescence following chemotherapy   3. Convalescence after radiotherapy     Patient expressed understanding and was in agreement with this plan. She also understands that She can call clinic at any time with any questions, concerns, or complaints.   Thank you for allowing me to participate in the care of this very pleasant patient.   Beckey Rutter, DNP, AGNP-C, [facility]

## 2022-11-28 ENCOUNTER — Inpatient Hospital Stay: Payer: Self-pay | Attending: Oncology | Admitting: Obstetrics and Gynecology

## 2022-11-28 VITALS — BP 110/60 | HR 66 | Temp 98.6°F | Wt 152.5 lb

## 2022-11-28 DIAGNOSIS — Z90710 Acquired absence of both cervix and uterus: Secondary | ICD-10-CM | POA: Insufficient documentation

## 2022-11-28 DIAGNOSIS — Z90722 Acquired absence of ovaries, bilateral: Secondary | ICD-10-CM | POA: Insufficient documentation

## 2022-11-28 DIAGNOSIS — Z9221 Personal history of antineoplastic chemotherapy: Secondary | ICD-10-CM | POA: Insufficient documentation

## 2022-11-28 DIAGNOSIS — Z8542 Personal history of malignant neoplasm of other parts of uterus: Secondary | ICD-10-CM | POA: Insufficient documentation

## 2022-11-28 DIAGNOSIS — Z923 Personal history of irradiation: Secondary | ICD-10-CM | POA: Insufficient documentation

## 2022-11-28 DIAGNOSIS — C541 Malignant neoplasm of endometrium: Secondary | ICD-10-CM

## 2022-11-28 NOTE — Progress Notes (Signed)
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 G20P1 female who is seen in consultation from Dr. Janese Banks for endometrial cancer s/p RA-TLH-BSO with SLND and perineal repair at [facility] on 03/31/22 who presents as new patient.   s/p 6 cycles of carbo-taxol completed 10/08/22, and vaginal brachytherapy completed 10/29/22, who presented to clinic recently for complaints of leg swelling and numbness and pain of toes/feet. She also complains of fatigue since completing treatment. Saw Jaime Johnson and she prescribed Cymbalta, but only on it for a week.    Gyn Oncology History She presented as 62 year old female with PMB to gyn. Biopsy showed endometrial cancer and was referred to Dr. Claiborne Billings from [facility]. She underwent RA-TLH-BSo on 03/30/2022.   Final Pathologic Diagnosis  A. SENTINEL LYMPH NODE, RIGHT EXTERNAL ILIAC ARTERY, RESECTION: Two lymph nodes, negative for metastatic carcinoma (0/2).    B. SENTINEL LYMPH NODE, LEFT EXTERNAL ILIAC ARTERY, RESECTION: One lymph node, negative for metastatic carcinoma (0/1).    C. UTERUS, FALLOPIAN TUBE AND OVARIES, HYSTERECTOMY WITH BILATERAL SALPINGO-OOPHORECTOMY: Endometrioid adenocarcinoma, FIGO grade 2. Tumor measures 5 cm in greatest dimension.  Tumor invades 14 of 17 mm thick myometrium (greater than 50%).  Margins of resection are uninvolved by tumor.  Tumor involves lower uterine segment.  Cervix is uninvolved by tumor.  Benign right fallopian tube and ovary.  Left ovary with tumor located within lymphovascular spaces, no tissue invasion identified.  Left fallopian tube with tumor within lymphovascular spaces, no tissue invasion identified. Free floating tumor located inside left fallopian tube.  pT1b pN0   Surgery was complicated by vaginal laceration/episiotomy. During the repair, the  tip of the needle broke off. Xray was performed and confirmed 'curvilinear suture needle projects over the left labial tissue/left perineum'. Needle was located using fluoroscopy and was removed in its entirety and incision was then repaired. Surgery was prolonged due to this with estimated time of ~ 7 hours.   Post op course was then complicated by fever requiring hospitalization for another week.   Dr. Claiborne Billings saw patient, recommended adjuvant chemotherapy with Carbo Taxol for 3 cycles followed by vaginal brachytherapy.  Patient transferred care to [facility] and has been followed by Dr. Janese Banks with medical oncology. 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 She is not a diabetic. She has baseline rheumatoid arthritis   Pathology was also second for a second opinion to [facility] as well.  Path report suggested that this was a T3a tumor FIGO stage IIIa disease. Left ovary and fallopian tube: Positive for carcinoma, lymphovascular involvement. Microcystic elongated fragmented (MELF) pattern of invasion and associated lymphovascular invasion.    1. Right external iliac artery sentinel lymph node, lymphadenectomy:   Two lymph nodes, negative for malignancy (0/2).   2.  Left external iliac artery, sentinel lymph node, lymphadenectomy:   One lymph node, suspicious for isolated tumor cells, see comment.   Comment: The paraffin block was requested for keratin immunohistochemistry, but the referring institution declined the request.  Since we were unable to obtain material for immunohistochemistry, this node will be classified as negative for synoptic reporting purposes and staging.     MLH1: Retained expression (wild-type). PMS2: Retained expression (wild-type). MSH2: Retained expression (wild-type). MSH6: Retained expression (wild-type).  P53 immunohistochemistry:   Wild type pattern (stain performed at [facility] on outside block C5).  PET 06/05/22 IMPRESSION: 1. Status post hysterectomy without  suspicious hypermetabolic soft tissue nodularity along the vaginal cuff to suggest residual disease. 2. No convincing evidence of hypermetabolic metastatic disease. 3. Mild mesenteric and omental stranding without abnormal FDG avid nodularity possibly reflecting postsurgical change, suggest attention on follow-up imaging. 4. Incidental 1.4 cm right thyroid nodule with slightly increased uptake. Recommend nonemergent thyroid US and biopsy of nodule if detected. Reference: J Am Coll Radiol. 2015 Feb;12(2): 143-50 5. Slightly increased background thyroid activity is nonspecific but may reflect thyroiditis recommend correlation with laboratory values. 6.  Aortic Atherosclerosis (ICD10-I70.0).  Received 3 cycles of CarboTaxol chemotherapy by Dr. Janese Banks. She requires G-CSF for leukopenia.  CT scan 10/23 shows no evidence of disease.  [facility] Tumor Board recommended completion of 6 cycles of carboplatin/paclitaxel followed by repeat PET versus CT scan to assess status of vaginal nodularity. If nodularity present and concerning for persistent disease (FDG avid or biopsy-proven) could either treat with EBRT + VBT or surgical resection followed by VBT. Imaging findings could be postoperative in nature given proximity to surgery.   6 cycles of carbo-taxol completed 10/08/22, and vaginal brachytherapy completed 10/29/22  Pap- 06/30/2009- NILM. 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   Hyperlipidemia LDL goal <100 02/16/2020   Cervical radicular pain (RIGHT) 09/29/2019   Bilateral carpal tunnel syndrome 09/29/2019   Essential hypertension 12/19/2016   Allergic rhinitis due to pollen     Past Medical History: Past Medical History:  Diagnosis Date   Allergic rhinitis due to pollen    Endometrial cancer ([place])     Hyperlipidemia LDL goal <100 02/16/2020   Hypertension 12/19/2016    Past Surgical History: Past Surgical History:  Procedure Laterality Date   CESAREAN SECTION     COLONOSCOPY WITH PROPOFOL N/A 11/12/2016   Procedure: COLONOSCOPY WITH PROPOFOL;  Surgeon: Lucilla Lame, MD;  Location: [place];  Service: Endoscopy;  Laterality: N/A;   COLONOSCOPY WITH PROPOFOL N/A 03/29/2017   Procedure: COLONOSCOPY WITH PROPOFOL;  Surgeon: Lucilla Lame, MD;  Location: [place];  Service: Endoscopy;  Laterality: N/A;   COLONOSCOPY WITH PROPOFOL N/A 03/14/2020   Procedure: COLONOSCOPY WITH PROPOFOL;  Surgeon: Lucilla Lame, MD;  Location: [place];  Service: Endoscopy;  Laterality: N/A;  priority 4   IR IMAGING GUIDED PORT INSERTION  06/01/2022   POLYPECTOMY  11/12/2016   Procedure: POLYPECTOMY;  Surgeon: Lucilla Lame, MD;  Location: [place];  Service: Endoscopy;;   POLYPECTOMY  03/29/2017   Procedure: POLYPECTOMY;  Surgeon: Lucilla Lame, MD;  Location: [place];  Service: Endoscopy;;   Past Gynecologic History:  Menarche: age 51 Last pap: per hpi Post menopausal Sexually active.   OB History:  OB History  Gravida Para Term Preterm AB Living  '1 1 1     1  '$ SAB IAB Ectopic Multiple Live Births               # Outcome Date GA Lbr Len/2nd Weight Sex Delivery Anes PTL Lv  1 Term             Family History: Family History  Problem Relation Age of Onset   Hepatitis C Mother 37       d/c at 14   Hypertension Father    Hepatitis C Brother  Breast cancer Neg Hx     Social History: Social History   Socioeconomic History   Marital status: Married    Spouse name: Not on file   Number of children: Not on file   Years of education: Not on file   Highest education level: Not on file  Occupational History   Not on file  Tobacco Use   Smoking status: Former    Types: Cigarettes    Quit date: 10/22/1978    Years since quitting: 44.1   Smokeless  tobacco: Never  Vaping Use   Vaping Use: Never used  Substance and Sexual Activity   Alcohol use: No   Drug use: No   Sexual activity: Yes    Partners: Male  Other Topics Concern   Not on file  Social History Narrative   Not on file   Social Determinants of Health   Financial Resource Strain: Low Risk  (06/22/2022)   Overall Financial Resource Strain (CARDIA)    Difficulty of Paying Living Expenses: Not very hard  Food Insecurity: No Food Insecurity (06/22/2022)   Hunger Vital Sign    Worried About Running Out of Food in the Last Year: Never true    Ran Out of Food in the Last Year: Never true  Transportation Needs: No Transportation Needs (06/22/2022)   PRAPARE - Hydrologist (Medical): No    Lack of Transportation (Non-Medical): No  Physical Activity: Inactive (06/22/2022)   Exercise Vital Sign    Days of Exercise per Week: 0 days    Minutes of Exercise per Session: 0 min  Stress: Stress Concern Present (06/22/2022)   [place]    Feeling of Stress : To some extent  Social Connections: Socially Integrated (06/22/2022)   Social Connection and Isolation Panel [NHANES]    Frequency of Communication with Friends and Family: Three times a week    Frequency of Social Gatherings with Friends and Family: Three times a week    Attends Religious Services: 1 to 4 times per year    Active Member of Clubs or Organizations: No    Attends Archivist Meetings: 1 to 4 times per year    Marital Status: Married  Human resources officer Violence: Not At Risk (06/22/2022)   Humiliation, Afraid, Rape, and Kick questionnaire    Fear of Current or Ex-Partner: No    Emotionally Abused: No    Physically Abused: No    Sexually Abused: No    Allergies: Allergies  Allergen Reactions   Azithromycin Other (See Comments)    Gets a bad yeast infection from this.    Current Medications: Current Outpatient  Medications  Medication Sig Dispense Refill   amLODipine (NORVASC) 5 MG tablet Take 1 tablet (5 mg total) by mouth daily. (Patient not taking: Reported on 11/19/2022) 30 tablet 3   diazepam (VALIUM) 5 MG tablet TAKE 1 TABLET(5 MG) BY MOUTH EVERY 6 HOURS AS NEEDED FOR ANXIETY (Patient not taking: Reported on 11/19/2022) 30 tablet 3   DULoxetine (CYMBALTA) 30 MG capsule Take 1 capsule (30 mg total) by mouth daily for 14 days, THEN 2 capsules (60 mg total) daily for 14 days. 42 capsule 0   Lidocaine-Hydrocort, Perianal, 3-0.5 % CREA Apply topically. (Patient not taking: Reported on 11/19/2022)     lidocaine-prilocaine (EMLA) cream Apply to affected area once (Patient not taking: Reported on 11/19/2022) 30 g 3   lisinopril (ZESTRIL) 40 MG tablet Take 1  tablet (40 mg total) by mouth daily. (Patient not taking: Reported on 11/19/2022) 30 tablet 3   Multiple Vitamins-Minerals (MULTIVITAMIN GUMMIES ADULTS PO) Take by mouth daily. (Patient not taking: Reported on 11/19/2022)     ondansetron (ZOFRAN) 8 MG tablet Take 1 tablet (8 mg total) by mouth every 8 (eight) hours as needed for nausea or vomiting. (Patient not taking: Reported on 09/17/2022) 60 tablet 2   ondansetron (ZOFRAN-ODT) 8 MG disintegrating tablet Take 8 mg by mouth every 8 (eight) hours as needed. (Patient not taking: Reported on 09/17/2022)     oxyCODONE (OXY IR/ROXICODONE) 5 MG immediate release tablet Take 1 tablet (5 mg total) by mouth every 8 (eight) hours. (Patient not taking: Reported on 11/19/2022) 20 tablet 0   prochlorperazine (COMPAZINE) 10 MG tablet Take 1 tablet (10 mg total) by mouth every 6 (six) hours as needed for nausea or vomiting. (Patient not taking: Reported on 11/19/2022) 60 tablet 2   rosuvastatin (CRESTOR) 10 MG tablet Take 1 tablet (10 mg total) by mouth daily. (Patient not taking: Reported on 09/17/2022) 90 tablet 3   SYSTANE COMPLETE 0.6 % SOLN Apply 1 drop to eye daily.     traMADol (ULTRAM) 50 MG tablet TAKE 1 TABLET(50 MG)  BY MOUTH DAILY 30 tablet 3   No current facility-administered medications for this visit.   Review of systems General: negative for fevers, changes in weight or night sweats Skin: negative for changes in moles or sores or rash Eyes: negative for changes in vision HEENT: negative for change in hearing, voice changes positive for tinnitus Pulmonary: negative for dyspnea, orthopnea, productive cough, wheezing Cardiac: negative for palpitations, pain Gastrointestinal: negative for nausea, vomiting, diarrhea, hematemesis, hematochezia. Positive for constipation and bleeding Genitourinary/Sexual: negative for dysuria, retention, hematuria, incontinence Ob/Gyn:  negative for abnormal bleeding, or pain Musculoskeletal: positive for pain in legs, joints, back Hematology: negative for easy bruising, abnormal bleeding Neurologic/Psych: negative for headaches, seizures, paralysis, weakness. Positive for numbness (chronic)  Objective:  Physical Examination:  Wt 152 lb 8 oz (69.2 kg)   LMP 02/29/2020 (Approximate) Comment: Has had random spotting; hadn't had a period 1.5 yrs prior  BMI 27.45 kg/m    ECOG Performance Status: 1 - Symptomatic but completely ambulatory  GENERAL: Patient is a well appearing female in no acute distress. Alopecia HEENT:  PERRL, neck supple with midline trachea. NODES:  No cervical, supraclavicular, axillary, or inguinal lymphadenopathy palpated.  LUNGS:  Clear to auscultation bilaterally.  No wheezes or rhonchi. HEART:  Regular rate and rhythm. No murmur appreciated. ABDOMEN:  Soft. Not distended. Well healed surgical scars. Mild tenderness with deep palpation diffusely.  MSK:  No focal spinal tenderness to palpation. Full range of motion bilaterally in the upper extremities. EXTREMITIES:  No peripheral edema.   SKIN:  Clear with no obvious rashes or skin changes. NEURO:  Nonfocal. Well oriented.  Appropriate affect.  Pelvic: Chaperoned by CMA EGBUS: no lesions.   Episiotomy site well healed Cervix: absent Vagina: no lesions, no discharge or bleeding.  The vaginal cuff is healed Uterus: absent BME: no palpable masses Rectovaginal: deferred   Lab Review N/A  Radiologic Imaging: 08/13/22  CT CHEST ABDOMEN PELVIS W CONTRAST CLINICAL DATA:  History of endometrial cancer, follow-up. * Tracking Code: BO *  EXAM: CT CHEST, ABDOMEN, AND PELVIS WITH CONTRAST  TECHNIQUE: Multidetector CT imaging of the chest, abdomen and pelvis was performed following the standard protocol during bolus administration of intravenous contrast.  RADIATION DOSE REDUCTION: This exam was performed  according to the departmental dose-optimization program which includes automated exposure control, adjustment of the mA and/or kV according to patient size and/or use of iterative reconstruction technique.  CONTRAST:  172m OMNIPAQUE IOHEXOL 300 MG/ML  SOLN  COMPARISON:  Multiple priors including PET-CT June 04, 2022.  FINDINGS: CT CHEST FINDINGS  Cardiovascular: Right chest Port-A-Cath with tip at the superior cavoatrial junction. Aortic atherosclerosis. Normal caliber thoracic aorta. No central pulmonary embolus on this nondedicated study. Normal size heart. No significant pericardial effusion/thickening.  Mediastinum/Nodes: Partially calcified right thyroid nodule measures 15 mm on image 7/2. No supraclavicular adenopathy. No pathologically enlarged mediastinal, hilar or axillary lymph nodes. Mild symmetric esophageal wall thickening.  Lungs/Pleura: Scattered subsegmental atelectasis. No suspicious pulmonary nodules or masses. No pleural effusion. No pneumothorax.  Musculoskeletal: Sclerotic focus in the T3 vertebral body is unchanged from PET-CT June 04, 2022 and was not hypermetabolic on that examination with Hounsfield units of 1,005 this is most consistent with a benign bone island. No aggressive lytic or blastic lesion of bone. Multilevel  degenerative changes spine.  CT ABDOMEN PELVIS FINDINGS  Hepatobiliary: Stable hypodense 3.1 cm lesion in the dome of the liver on image 43/2 previously characterized as a benign hepatic hemangioma on MRI August 08, 2016. No suspicious hepatic lesion. Mild wall thickening of a nondistended gallbladder which contains a 2 cm gallstone, suggestive of chronic inflammation. No biliary ductal dilation.  Pancreas: No pancreatic ductal dilation or evidence of acute inflammation.  Spleen: No splenomegaly or focal splenic lesion.  Adrenals/Urinary Tract: Left adrenal nodule measures 9 mm on image 58/2, stable dating back to MRI abdomen dated August 08, 2016 consistent with a benign adenoma requiring no independent follow-up. Right adrenal gland appears normal. No hydronephrosis. Kidneys demonstrate symmetric enhancement and excretion of contrast material. Bilateral subcentimeter hypodense renal lesions are technically too small to accurately characterize but statistically likely reflect cysts and considered benign requiring no independent imaging follow-up. Urinary bladder is unremarkable for degree of distension.  Stomach/Bowel: Radiopaque enteric contrast material traverses distal loops of small bowel. Stomach is unremarkable for degree of distension. No pathologic dilation of small or large bowel. The appendix and terminal ileum appear normal. No evidence of acute bowel inflammation.  Vascular/Lymphatic: Normal caliber abdominal aorta. No pathologically enlarged abdominal or pelvic lymph nodes.  Reproductive: Status post hysterectomy no suspicious nodularity along the vaginal cuff. No adnexal masses.  Other: Decreased mild mesenteric and omental stranding without discrete nodularity. No significant abdominopelvic free fluid.  Musculoskeletal: No aggressive lytic or blastic lesion of bone. Stable sclerotic focus in the left acetabulum which was not hypermetabolic on prior PET-CT  and is consistent with a benign bone island.  IMPRESSION: 1. Status post hysterectomy without evidence of local recurrence. 2. No convincing evidence of metastatic disease within the chest, abdomen, or pelvis. 3. Decreased mild mesenteric and omental stranding without discrete nodularity or significant abdominopelvic free fluid. Continued attention on follow-up imaging suggested. 4. Partially calcified right thyroid nodule measuring 15 mm, this was mildly hypermetabolic on prior PET-CT and warrants further evaluation with ultrasound and possible FNA. 5. Mild symmetric esophageal wall thickening, suggestive of esophagitis. 6. Cholelithiasis with mild wall thickening of a nondistended gallbladder, suggestive of chronic inflammation. Consider further evaluation with nuclear medicine HIDA scan with ejection fraction 7. Stable benign hepatic hemangioma and left adrenal adenoma. 8.  Aortic Atherosclerosis (ICD10-I70.0).  Electronically Signed   By: JDahlia BailiffM.D.   On: 08/08/2022 15:51      Assessment:  SSEMIYAH NEWGENTis  a 62 y.o. female diagnosed with Stage IIIA, grade 2 endometrioid endometrial cancer.  Underwent robotic TLH/BSO SLN mapping and biopsies 03/30/22 at [facility].  Tumor measures 5 cm in greatest dimension and invades 14 of 17 mm thick myometrium (greater than 50%), cervix is uninvolved by tumor, Microcystic elongated fragmented (MELF) pattern of invasion and associated lymphovascular invasion.  Left ovary with tumor located within lymphovascular spaces, no tissue invasion identified. Left fallopian tube with tumor within lymphovascular spaces, no tissue invasion identified. Free floating tumor located inside left fallopian tube. pMMR; p53wt with Left external iliac artery SLN suspicious for isolated tumor cells.    PET scan 8/23 with some questionable vaginal nodularity vs post op changes.  Completed 6 cycles of CarboTaxol chemotherapy and vaginal brachytherapy 1/24.  CT  scan negative 10/23.  Vaginal exam normal on exam today. NED  Partially calcified right thyroid nodule measuring was mildly hypermetabolic on prior PET-CT, uncertain etiology.    Medical co-morbidities complicating care: HTN and prior abdominal surgery. There is no height or weight on file to calculate BMI.   Plan:   Problem List Items Addressed This Visit       Genitourinary   Endometrial carcinoma ([place]) - Primary (Chronic)    Recommend follow up every three months, as she has a significant risk of recurence.  She will see Dr Janese Banks in 2 months with imaging and [place] in 6 months.   Partially calcified right thyroid nodule measuring was mildly hypermetabolic on prior PET-CT, uncertain etiology.  Radiology recommended  further evaluation with ultrasound and possible FNA.  Defer to Dr. Janese Banks regarding management.  The patient's diagnosis, an outline of the further diagnostic and laboratory studies which will be required, the recommendation, and alternatives were discussed.  All questions were answered to the patient's satisfaction.  Mellody Drown, MD

## 2022-11-28 NOTE — Progress Notes (Signed)
Survivorship Care Plan visit completed.  Treatment summary reviewed and given to patient.  ASCO answers booklet reviewed and given to patient.  CARE program and Cancer Transitions discussed with patient along with other resources cancer center offers to patients and caregivers.  Patient verbalized understanding.    

## 2022-11-29 ENCOUNTER — Other Ambulatory Visit: Payer: Self-pay

## 2022-12-11 ENCOUNTER — Ambulatory Visit: Payer: Self-pay | Attending: Nurse Practitioner

## 2022-12-11 DIAGNOSIS — C541 Malignant neoplasm of endometrium: Secondary | ICD-10-CM | POA: Insufficient documentation

## 2022-12-11 DIAGNOSIS — R2681 Unsteadiness on feet: Secondary | ICD-10-CM | POA: Insufficient documentation

## 2022-12-11 DIAGNOSIS — M6281 Muscle weakness (generalized): Secondary | ICD-10-CM | POA: Insufficient documentation

## 2022-12-11 NOTE — Therapy (Unsigned)
OUTPATIENT PHYSICAL THERAPY BALANCE/DECONDITINIONG EVALUATION   Patient Name: Jaime Johnson MRN: [id number] DOB:1961/06/17, 62 y.o., female Today's Date: 12/12/2022  END OF SESSION:  PT End of Session - 12/12/22 2252     Visit Number 1    Number of Visits 25    Date for PT Re-Evaluation 03/05/23    Authorization Type eval: 12/11/22;    PT Start Time 1450    PT Stop Time 1530    PT Time Calculation (min) 40 min    Activity Tolerance Patient tolerated treatment well    Behavior During Therapy [facility] for tasks assessed/performed            Past Medical History:  Diagnosis Date   Allergic rhinitis due to pollen    Endometrial cancer ([place])    Hyperlipidemia LDL goal <100 02/16/2020   Hypertension 12/19/2016   Past Surgical History:  Procedure Laterality Date   CESAREAN SECTION     COLONOSCOPY WITH PROPOFOL N/A 11/12/2016   Procedure: COLONOSCOPY WITH PROPOFOL;  Surgeon: Lucilla Lame, MD;  Location: [place];  Service: Endoscopy;  Laterality: N/A;   COLONOSCOPY WITH PROPOFOL N/A 03/29/2017   Procedure: COLONOSCOPY WITH PROPOFOL;  Surgeon: Lucilla Lame, MD;  Location: [place];  Service: Endoscopy;  Laterality: N/A;   COLONOSCOPY WITH PROPOFOL N/A 03/14/2020   Procedure: COLONOSCOPY WITH PROPOFOL;  Surgeon: Lucilla Lame, MD;  Location: [place];  Service: Endoscopy;  Laterality: N/A;  priority 4   IR IMAGING GUIDED PORT INSERTION  06/01/2022   POLYPECTOMY  11/12/2016   Procedure: POLYPECTOMY;  Surgeon: Lucilla Lame, MD;  Location: [place];  Service: Endoscopy;;   POLYPECTOMY  03/29/2017   Procedure: POLYPECTOMY;  Surgeon: Lucilla Lame, MD;  Location: [place];  Service: Endoscopy;;   Patient Active Problem List   Diagnosis Date Noted   Colon cancer ([place]) 05/22/2022   Endometrial carcinoma ([place]) 03/09/2022   Foraminal stenosis of cervical region (RIGHT) 03/16/2020   Hyperlipidemia LDL goal <100 02/16/2020   Cervical radicular  pain (RIGHT) 09/29/2019   Bilateral carpal tunnel syndrome 09/29/2019   Essential hypertension 12/19/2016   Allergic rhinitis due to pollen    PCP: Owens Loffler MD  REFERRING PROVIDER: Beckey Rutter NP  REFERRING DIAG: Endometrial carcinoma  RATIONALE FOR EVALUATION AND TREATMENT: Rehabilitation  THERAPY DIAG: Muscle weakness (generalized)  Unsteadiness on feet  ONSET DATE:   FOLLOW-UP APPT SCHEDULED WITH REFERRING PROVIDER: Yes, radiology this Thursday.   SUBJECTIVE:  SUBJECTIVE STATEMENT:  General deconditioning  PERTINENT HISTORY:  Patient is 62 year old female diagnosed with endometrial cancer, s/p 6 cycles of carbo-taxol completed 10/08/22, and vaginal brachytherapy completed 10/29/22. She has been complaining of leg swelling, numbness, and pain of the toes/feet. Previously took gabapentin and lyrica for her symptoms but was unable to tolerate d/t side effects. Compression leggings have helped somewhat. She also complains today of hip and BLE weakness. Pt reports BLE shaking and low back/sacral pain as well as general joint stiffness. She reports that when she was diagnosed her cancer was initially classified as stage 1 but after lymph node testing they found evidence in the lymph channels and reclassified her to stage 3. Her next scan is during the third week of March.  Pain: No, no pain currently but occasional low back and bilateral hip pain; Numbness/Tingling: Yes, numbness, tingling, stabbling, and burning in stocking distribution BLE; Focal Weakness: Yes, BLE LE weakness Prior history of physical therapy for balance:  No Dominant hand: right Red flags: Positive for history of endometrial cancer. Negative for bowel/bladder changes, saddle paresthesia, h/o spinal tumors, h/o compression fx, h/o  abdominal aneurysm, abdominal pain, chills/fever, night sweats, nausea, and vomiting.  PRECAUTIONS: None  WEIGHT BEARING RESTRICTIONS: No  FALLS: Has patient fallen in last 6 months? No,   Living Environment Lives with: lives with their spouse Lives in: House/apartment Stairs:  1 step to enter , 2 level house (attic upstairs, 1 flight of stairs with handrail); Has following equipment at home: Single point cane, Walker - 2 wheeled, and Shower bench  Prior level of function: Independent, husband assists with IADLs if needed;  Occupational demands: Stopped working at [employer] prior to 2020;  Hobbies: walking, working in her garden;   Patient Goals: "I would like to be able to stretch and walk without pain"   OBJECTIVE:   Patient Surveys  FOTO: To be completed  ABC: To be completed  Cognition Patient is oriented to person, place, and time.  Recent memory is intact.  Remote memory is intact.  Attention span and concentration are intact.  Expressive speech is intact.  Patient's fund of knowledge is within normal limits for educational level.    Gross Musculoskeletal Assessment Tremor: None Bulk: Normal Tone: Normal No pitting edema noted in BLE  GAIT: Deferred full gait assessment but decrease self-selected speed;  Posture: No gross abnormalities noted in standing or seated posture  AROM Deferred  LE MMT: MMT (out of 5) Right  Left   Hip flexion 4 4  Hip extension    Hip abduction (seated) 4 4  Hip adduction (seated) 4 4  Hip internal rotation 4 4  Hip external rotation 4 4  Knee flexion 4 4  Knee extension 5 5  Ankle dorsiflexion 4 4  Ankle plantarflexion    Ankle inversion    Ankle eversion    (* = pain; Blank rows = not tested)  Sensation Deferred  Reflexes Deferred  Cranial Nerves Deferred  Coordination/Cerebellar Deferred   FUNCTIONAL OUTCOME MEASURES  Results Comments  BERG    DGI    FGA    TUG    5TSTS 33.0  seconds   2 Minute Walk Test    10 Meter Gait Speed    (Blank rows = not tested)   TODAY'S TREATMENT  Deferred   PATIENT EDUCATION:  Education details: Plan of care and prognosis; Person educated: Patient Education method: Explanation Education comprehension: verbalized understanding   HOME EXERCISE PROGRAM:  None currently;   ASSESSMENT:  CLINICAL IMPRESSION: Patient is a 62 y.o. female who was seen today for physical therapy evaluation for deconditioning and unsteadiness s/p chemo and radiation for endometrial cancern.   OBJECTIVE IMPAIRMENTS: decreased balance, decreased strength, and pain.   ACTIVITY LIMITATIONS: lifting, bending, sitting, standing, and squatting  PARTICIPATION LIMITATIONS: meal prep, cleaning, laundry, shopping, community activity, and yard work  PERSONAL FACTORS: Age, Past/current experiences, Time since onset of injury/illness/exacerbation, and 1-2 comorbidities: endometrial cancer and hypertension  are also affecting patient's functional outcome.   REHAB POTENTIAL: Good  CLINICAL DECISION MAKING: Evolving/moderate complexity  EVALUATION COMPLEXITY: Moderate   GOALS: Goals reviewed with patient? No  SHORT TERM GOALS: Target date: 01/22/2023   Pt will be independent with HEP in order to improve strength and balance in order to decrease fall risk and improve function at home. Baseline:  Goal status: INITIAL   LONG TERM GOALS: Target date: 03/05/23  Pt will increase FOTO to at least predicted number to demonstrate significant improvement in function at home related to balance and leg strength. Baseline: 12/11/22: To be completed; Goal status: INITIAL  2.  Pt will improve BERG by at least 3 points in order to demonstrate clinically significant improvement in balance.   Baseline: 12/11/22: To be completed Goal status: INITIAL  3. Pt will decrease TUG to below 14 seconds in order to demonstrate decreased fall risk.    Baseline: 12/11/22:  33.0s; Goal status: INITIAL  4. Pt will improve 30s Sit to Stand Test by at least 2 repetitions in order to demonstrate improved BLE endurance and improve her ability to walk and work in the yard.     Baseline: 12/11/22: To be completed; Goal status: INITIAL  5. Pt will increase 2MWT by at least 63f in order to demonstrate clinically significant improvement in cardiopulmonary endurance and community ambulation   Baseline:  Goal status: INITIAL   PLAN: PT FREQUENCY: 2x/week  PT DURATION: 12 weeks  PLANNED INTERVENTIONS: Therapeutic exercises, Therapeutic activity, Neuromuscular re-education, Balance training, Gait training, Patient/Family education, Joint manipulation, Joint mobilization, Canalith repositioning, Aquatic Therapy, Dry Needling, Cognitive remediation, Electrical stimulation, Spinal manipulation, Spinal mobilization, Cryotherapy, Moist heat, Traction, Ultrasound, Ionotophoresis 4105mml Dexamethasone, and Manual therapy  PLAN FOR NEXT SESSION: Have pt complete ABC, sensation testing, BERG, 30s Sit to Stand Test, 2MWT, initiate strength/balance, issue HEP;   JaLyndel Safeuprich PT, DPT, GCS  Nicole Defino 12/12/2022, 10:52 PM

## 2022-12-13 ENCOUNTER — Ambulatory Visit: Payer: Self-pay

## 2022-12-13 ENCOUNTER — Encounter: Payer: Self-pay | Admitting: Radiation Oncology

## 2022-12-13 ENCOUNTER — Ambulatory Visit
Admission: RE | Admit: 2022-12-13 | Discharge: 2022-12-13 | Disposition: A | Discharge status: Home / Self Care | Payer: Self-pay | Source: Ambulatory Visit | Attending: Radiation Oncology | Admitting: Radiation Oncology

## 2022-12-13 VITALS — BP 158/107 | HR 82 | Temp 98.3°F | Resp 16 | Wt 148.5 lb

## 2022-12-13 DIAGNOSIS — C541 Malignant neoplasm of endometrium: Secondary | ICD-10-CM | POA: Insufficient documentation

## 2022-12-13 DIAGNOSIS — R2681 Unsteadiness on feet: Secondary | ICD-10-CM

## 2022-12-13 DIAGNOSIS — M6281 Muscle weakness (generalized): Secondary | ICD-10-CM

## 2022-12-13 NOTE — Progress Notes (Signed)
Radiation Oncology Follow up Note  Name: Jaime Johnson   Date:   12/13/2022 MRN:  [id number] DOB: 1961-04-18    This 62 y.o. 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  Tumor was 5 cm admitted for 14 of 17 mm.  She had free-floating tumor within the fallopian tube.  She completed 6 cycles of CarboTaxol and vaginal brachytherapy.  She is seen 1 month out from that.  Recent vaginal exam earlier this month Dr. Fransisca Connors was normal.  She is slowly recovering is going through physical therapy to increase her strength.  Does state that she does occasionally have some rectal incontinence..  COMPLICATIONS OF TREATMENT: none  FOLLOW UP COMPLIANCE: keeps appointments   PHYSICAL EXAM:  BP (!) 158/107 (BP Location: Left Wrist, Patient Position: Sitting, Cuff Size: Small) Comment: patient monitors at home and is WNL  Pulse 82   Temp 98.3 F (36.8 C) (Tympanic)   Resp 16   Wt 148 lb 8 oz (67.4 kg)   LMP 02/29/2020 (Approximate) Comment: Has had random spotting; hadn't had a period 1.5 yrs prior  BMI 26.73 kg/m  Well-developed well-nourished patient in NAD. HEENT reveals PERLA, EOMI, discs not visualized.  Oral cavity is clear. No oral mucosal lesions are identified. Neck is clear without evidence of cervical or supraclavicular adenopathy. Lungs are clear to A&P. Cardiac examination is essentially unremarkable with regular rate and rhythm without murmur rub or thrill. Abdomen is benign with no organomegaly or masses noted. Motor sensory and DTR levels are equal and symmetric in the upper and lower extremities. Cranial nerves II through XII are grossly intact.  Proprioception is intact. No peripheral adenopathy or edema is identified. No motor or sensory levels are noted. Crude visual fields are within normal range.  RADIOLOGY RESULTS: No current films for review  PLAN: Present time patient is slowly recovering from her combined modality treatment including systemic chemotherapy robotic surgery as well as vaginal brachytherapy.  And pleased with her overall progress at this time I am going to turn follow-up care over to GYN oncology and medical oncology.  I would be happy to reevaluate the patient in time should that be indicated.  I would like to take this opportunity to thank you for allowing me to participate in the care of your patient.Noreene Filbert, MD

## 2022-12-13 NOTE — Therapy (Signed)
OUTPATIENT PHYSICAL THERAPY BALANCE/DECONDITINIONG TREATMENT   Patient Name: Jaime Johnson MRN: [id number] DOB:Mar 11, 1961, 62 y.o., female Today's Date: 12/13/2022  END OF SESSION:  PT End of Session - 12/13/22 1616     Visit Number 2    Number of Visits 25    Date for PT Re-Evaluation 03/05/23    Authorization Type eval: 12/11/22;    PT Start Time 1445    PT Stop Time 1530    PT Time Calculation (min) 45 min    Activity Tolerance Patient tolerated treatment well    Behavior During Therapy [facility] for tasks assessed/performed            Past Medical History:  Diagnosis Date   Allergic rhinitis due to pollen    Endometrial cancer ([place])    Hyperlipidemia LDL goal <100 02/16/2020   Hypertension 12/19/2016   Past Surgical History:  Procedure Laterality Date   CESAREAN SECTION     COLONOSCOPY WITH PROPOFOL N/A 11/12/2016   Procedure: COLONOSCOPY WITH PROPOFOL;  Surgeon: Lucilla Lame, MD;  Location: [place];  Service: Endoscopy;  Laterality: N/A;   COLONOSCOPY WITH PROPOFOL N/A 03/29/2017   Procedure: COLONOSCOPY WITH PROPOFOL;  Surgeon: Lucilla Lame, MD;  Location: [place];  Service: Endoscopy;  Laterality: N/A;   COLONOSCOPY WITH PROPOFOL N/A 03/14/2020   Procedure: COLONOSCOPY WITH PROPOFOL;  Surgeon: Lucilla Lame, MD;  Location: [place];  Service: Endoscopy;  Laterality: N/A;  priority 4   IR IMAGING GUIDED PORT INSERTION  06/01/2022   POLYPECTOMY  11/12/2016   Procedure: POLYPECTOMY;  Surgeon: Lucilla Lame, MD;  Location: [place];  Service: Endoscopy;;   POLYPECTOMY  03/29/2017   Procedure: POLYPECTOMY;  Surgeon: Lucilla Lame, MD;  Location: [place];  Service: Endoscopy;;   Patient Active Problem List   Diagnosis Date Noted   Colon cancer ([place]) 05/22/2022   Endometrial carcinoma ([place]) 03/09/2022   Foraminal stenosis of cervical region (RIGHT) 03/16/2020   Hyperlipidemia LDL goal <100 02/16/2020   Cervical radicular pain  (RIGHT) 09/29/2019   Bilateral carpal tunnel syndrome 09/29/2019   Essential hypertension 12/19/2016   Allergic rhinitis due to pollen    PCP: Owens Loffler MD  REFERRING PROVIDER: Beckey Rutter NP  REFERRING DIAG: Endometrial carcinoma  RATIONALE FOR EVALUATION AND TREATMENT: Rehabilitation  THERAPY DIAG: Muscle weakness (generalized)  Unsteadiness on feet  ONSET DATE:   FOLLOW-UP APPT SCHEDULED WITH REFERRING PROVIDER: Yes, radiology this Thursday.  From initial evaluation note: SUBJECTIVE:  SUBJECTIVE STATEMENT:  General deconditioning  PERTINENT HISTORY:  Patient is 62 year old female diagnosed with endometrial cancer, s/p 6 cycles of carbo-taxol completed 10/08/22, and vaginal brachytherapy completed 10/29/22. She has been complaining of leg swelling, numbness, and pain of the toes/feet. Previously took gabapentin and lyrica for her symptoms but was unable to tolerate d/t side effects. Compression leggings have helped somewhat. She also complains today of hip and BLE weakness. Pt reports BLE shaking and low back/sacral pain as well as general joint stiffness. She reports that when she was diagnosed her cancer was initially classified as stage 1 but after lymph node testing they found evidence in the lymph channels and reclassified her to stage 3. Her next scan is during the third week of March.  Pain: No, no pain currently but occasional low back and bilateral hip pain; Numbness/Tingling: Yes, numbness, tingling, stabbling, and burning in stocking distribution BLE; Focal Weakness: Yes, BLE LE weakness Prior history of physical therapy for balance:  No Dominant hand: right Red flags: Positive for history of endometrial cancer. Negative for bowel/bladder changes, saddle paresthesia, h/o spinal tumors,  h/o compression fx, h/o abdominal aneurysm, abdominal pain, chills/fever, night sweats, nausea, and vomiting.  PRECAUTIONS: None  WEIGHT BEARING RESTRICTIONS: No  FALLS: Has patient fallen in last 6 months? No,   Living Environment Lives with: lives with their spouse Lives in: House/apartment Stairs:  1 step to enter , 2 level house (attic upstairs, 1 flight of stairs with handrail); Has following equipment at home: Single point cane, Walker - 2 wheeled, and Shower bench  Prior level of function: Independent, husband assists with IADLs if needed;  Occupational demands: Stopped working at [employer] prior to 2020;  Hobbies: walking, working in her garden;   Patient Goals: "I would like to be able to stretch and walk without pain"   OBJECTIVE:   Patient Surveys  FOTO: 50, predicted improvement to 31 ABC: To be completed  Cognition Patient is oriented to person, place, and time.  Recent memory is intact.  Remote memory is intact.  Attention span and concentration are intact.  Expressive speech is intact.  Patient's fund of knowledge is within normal limits for educational level.    Gross Musculoskeletal Assessment Tremor: None Bulk: Normal Tone: Normal No pitting edema noted in BLE  GAIT: Deferred full gait assessment but decrease self-selected speed;  Posture: No gross abnormalities noted in standing or seated posture  AROM Deferred  LE MMT: MMT (out of 5) Right  Left   Hip flexion 4 4  Hip extension    Hip abduction (seated) 4 4  Hip adduction (seated) 4 4  Hip internal rotation 4 4  Hip external rotation 4 4  Knee flexion 4 4  Knee extension 5 5  Ankle dorsiflexion 4 4  Ankle plantarflexion    Ankle inversion    Ankle eversion    (* = pain; Blank rows = not tested)  Sensation Deferred  Reflexes Deferred  Cranial Nerves Deferred  Coordination/Cerebellar Deferred   FUNCTIONAL OUTCOME MEASURES  Results Comments  BERG     DGI    FGA    TUG    5TSTS 33.0 seconds   2 Minute Walk Test    10 Meter Gait Speed    (Blank rows = not tested)   TODAY'S TREATMENT  12/13/22: SUBJECTIVE: pt reports she is a little tired as she has had a busy day.  She had a doctor's appointment, went to grocery store, then  ate lunch in her car before PT.  Therapeutic Exercise:  Pt completed ABC: scored 21/45 (indicating low level physical functioning) Nustep: level 1-3 x 5 minutes; pt notes fatigue and increased parasthesias in feet at 5 min.  30 second sit to stand test: able to complete 4 full repetitions (used hands for support x1) Repeated 2nd trial, 4 reps. (At age 48 y/o <12 reps indicates Fall Risk)  Seated hip abd with blue band x 10, too difficulty- pt substituted with lumbar spine extension, x10 green band Seated hip add with ball: 3x5 Standing heel raises: 3x5 holding railing b/l UE  HEP initiated: gave handout from [facility]:  Education details: Plan of care and prognosis; Person educated: Patient Education method: Explanation Education comprehension: verbalized understanding   HOME EXERCISE PROGRAM: Access Code: [email or web address] URL: [email or web address] Date: 12/13/2022 Prepared by: Merdis Delay  Exercises - Sit to Stand Without Arm Support  - 1 x daily - 7 x weekly - 3 sets - 5 reps - Seated Hip Abduction with Resistance  - 1 x daily - 7 x weekly - 3 sets - 5 reps - Seated Isometric Hip Adduction with Ball  - 1 x daily - 7 x weekly - 3 sets - 5 reps - Heel Raises with Counter Support  - 1 x daily - 7 x weekly - 2 sets - 10 reps   ASSESSMENT:  CLINICAL IMPRESSION: Patient attended PT tx session today for deconditioning and unsteadiness s/p chemo and radiation for endometrial cancern.  Frequent rest breaks needed throughout session due to fatigue this afternoon.  Able to initiate LE strengthening and gave pt a HEP to begin.  Outcome measures completed today also  support the needed for skilled PT interventions.    OBJECTIVE IMPAIRMENTS: decreased balance, decreased strength, and pain.   ACTIVITY LIMITATIONS: lifting, bending, sitting, standing, and squatting  PARTICIPATION LIMITATIONS: meal prep, cleaning, laundry, shopping, community activity, and yard work  PERSONAL FACTORS: Age, Past/current experiences, Time since onset of injury/illness/exacerbation, and 1-2 comorbidities: endometrial cancer and hypertension  are also affecting patient's functional outcome.   REHAB POTENTIAL: Good  CLINICAL DECISION MAKING: Evolving/moderate complexity  EVALUATION COMPLEXITY: Moderate   GOALS: Goals reviewed with patient? No  SHORT TERM GOALS: Target date: 01/22/2023   Pt will be independent with HEP in order to improve strength and balance in order to decrease fall risk and improve function at home. Baseline:  Goal status: INITIAL   LONG TERM GOALS: Target date: 03/05/23  Pt will increase FOTO to at least 63 to demonstrate significant improvement in function at home related to balance and leg strength. Baseline: 12/11/22: 50 Goal status: INITIAL  2.  Pt will improve BERG by at least 3 points in order to demonstrate clinically significant improvement in balance.   Baseline: 12/11/22: To be completed Goal status: INITIAL  3. Pt will decrease TUG to below 14 seconds in order to demonstrate decreased fall risk.    Baseline: 12/11/22: 33.0s; Goal status: INITIAL  4. Pt will improve 30s Sit to Stand Test by at least 2 repetitions in order to demonstrate improved BLE endurance and improve her ability to walk and work in the yard.     Baseline: 12/11/22: To be completed; Goal status: INITIAL  5. Pt will increase 2MWT by at least 38f in order to demonstrate clinically significant improvement in cardiopulmonary endurance and community ambulation   Baseline:  Goal status: INITIAL   PLAN: PT FREQUENCY: 2x/week  PT DURATION: 12 weeks  PLANNED  INTERVENTIONS: Therapeutic exercises, Therapeutic activity, Neuromuscular re-education, Balance training, Gait training, Patient/Family education, Joint manipulation, Joint mobilization, Canalith repositioning, Aquatic Therapy, Dry Needling, Cognitive remediation, Electrical stimulation, Spinal manipulation, Spinal mobilization, Cryotherapy, Moist heat, Traction, Ultrasound, Ionotophoresis 16m/ml Dexamethasone, and Manual therapy  PLAN FOR NEXT SESSION: Sensation testing, BERG, 2MWT   RMerdis Delay PT, DPT, OCS  #[phone] RPincus Badder2/22/2024, 4:17 PM

## 2022-12-17 ENCOUNTER — Encounter: Payer: Self-pay | Admitting: Nurse Practitioner

## 2022-12-17 ENCOUNTER — Inpatient Hospital Stay (HOSPITAL_BASED_OUTPATIENT_CLINIC_OR_DEPARTMENT_OTHER): Payer: Self-pay | Admitting: Nurse Practitioner

## 2022-12-17 DIAGNOSIS — G620 Drug-induced polyneuropathy: Secondary | ICD-10-CM

## 2022-12-17 DIAGNOSIS — T451X5A Adverse effect of antineoplastic and immunosuppressive drugs, initial encounter: Secondary | ICD-10-CM

## 2022-12-17 NOTE — Progress Notes (Signed)
[facility]  Virtual Visit Progress Note  [facility] [place]. [facility] [address], [place], [place] [address] [phone] (phone) [phone] (fax)  Patient Care Team: Owens Loffler, MD as PCP - General (Family Medicine) Rico Junker, RN as Registered Nurse Theodore Demark, RN (Inactive) as Registered Nurse Clent Jacks, RN as Oncology Nurse Navigator Noreene Filbert, MD as Consulting Physician (Radiation Oncology) Gillis Ends, MD as Referring Physician (Obstetrics) Sindy Guadeloupe, MD as Consulting Physician (Oncology)   Name of the patient: Jaime Johnson  [id number]  05/28/1961   Date of visit: 12/17/22  Diagnosis- Endometrial Cancer  I connected with Jaime Johnson on 12/17/22 at  3:00 PM EST by telephone visit and verified that I am speaking with the correct person using two identifiers.   I discussed the limitations, risks, security and privacy concerns of performing an evaluation and management service by telemedicine and the availability of in-person appointments. I also discussed with the patient that there may be a patient responsible charge related to this service. The patient expressed understanding and agreed to proceed.   Other persons participating in the visit and their role in the encounter: none   Patient's location: home  Provider's location: clinic   Chief complaint/ Reason for visit- peripheral neuropathy f/u   Heme/Onc history:  Oncology History  Endometrial carcinoma ([place])  03/09/2022 Initial Diagnosis   Endometrial carcinoma ([place])   05/22/2022 Cancer Staging   Staging form: Corpus Uteri - Carcinoma and Carcinosarcoma, AJCC 8th Edition - Pathologic stage from 05/22/2022: FIGO Stage IB (pT1b, pN0(i+), cM0) - Signed by Lloyd Huger, MD on 06/07/2022 Histologic grade (G): G2 Histologic grading system: 3 grade  system Residual tumor (R): R0 - None   06/14/2022 -  Chemotherapy   Patient is on Treatment Plan : UTERINE Carboplatin AUC 6 + Paclitaxel q21d       Interval history- Patient is 62 year old female diagnosed with endometrial cancer, s/p 6 cycles of carbo-taxol completed 10/08/22, and vaginal brachytherapy completed 10/29/22, who agrees to evaluation via telemedicine of peripheral neuropathy. She was started on duloxetine but never started it. She is eager to get medications and treatment out of her system and preferred to see acupuncture & chiropractic. Also receiving massage. She is feeling much better.   ECOG FS:2 - Symptomatic, <50% confined to bed  Review of systems- Review of Systems  Constitutional:  Positive for malaise/fatigue.  Neurological:  Positive for tingling.     Allergies  Allergen Reactions   Azithromycin Other (See Comments)    Gets a bad yeast infection from this.    Past Medical History:  Diagnosis Date   Allergic rhinitis due to pollen    Endometrial cancer ([place])    Hyperlipidemia LDL goal <100 02/16/2020   Hypertension 12/19/2016    Past Surgical History:  Procedure Laterality Date   CESAREAN SECTION     COLONOSCOPY WITH PROPOFOL N/A 11/12/2016   Procedure: COLONOSCOPY WITH PROPOFOL;  Surgeon: Lucilla Lame, MD;  Location: [facility];  Service: Endoscopy;  Laterality: N/A;   COLONOSCOPY WITH PROPOFOL N/A 03/29/2017   Procedure: COLONOSCOPY WITH PROPOFOL;  Surgeon: Lucilla Lame, MD;  Location: [facility];  Service: Endoscopy;  Laterality: N/A;   COLONOSCOPY WITH PROPOFOL N/A 03/14/2020   Procedure: COLONOSCOPY WITH PROPOFOL;  Surgeon: Lucilla Lame, MD;  Location: [facility];  Service: Endoscopy;  Laterality: N/A;  priority  4   IR IMAGING GUIDED PORT INSERTION  06/01/2022   POLYPECTOMY  11/12/2016   Procedure: POLYPECTOMY;  Surgeon: Lucilla Lame, MD;  Location: [place];  Service: Endoscopy;;   POLYPECTOMY  03/29/2017   Procedure:  POLYPECTOMY;  Surgeon: Lucilla Lame, MD;  Location: [facility];  Service: Endoscopy;;    Social History   Socioeconomic History   Marital status: Married    Spouse name: Not on file   Number of children: Not on file   Years of education: Not on file   Highest education level: Not on file  Occupational History   Not on file  Tobacco Use   Smoking status: Former    Types: Cigarettes    Quit date: 10/22/1978    Years since quitting: 44.1   Smokeless tobacco: Never  Vaping Use   Vaping Use: Never used  Substance and Sexual Activity   Alcohol use: No   Drug use: No   Sexual activity: Yes    Partners: Male  Other Topics Concern   Not on file  Social History Narrative   Not on file   Social Determinants of Health   Financial Resource Strain: Low Risk  (06/22/2022)   Overall Financial Resource Strain (CARDIA)    Difficulty of Paying Living Expenses: Not very hard  Food Insecurity: No Food Insecurity (06/22/2022)   Hunger Vital Sign    Worried About Running Out of Food in the Last Year: Never true    Ran Out of Food in the Last Year: Never true  Transportation Needs: No Transportation Needs (06/22/2022)   PRAPARE - Hydrologist (Medical): No    Lack of Transportation (Non-Medical): No  Physical Activity: Inactive (06/22/2022)   Exercise Vital Sign    Days of Exercise per Week: 0 days    Minutes of Exercise per Session: 0 min  Stress: Stress Concern Present (06/22/2022)   [place]    Feeling of Stress : To some extent  Social Connections: Socially Integrated (06/22/2022)   Social Connection and Isolation Panel [NHANES]    Frequency of Communication with Friends and Family: Three times a week    Frequency of Social Gatherings with Friends and Family: Three times a week    Attends Religious Services: 1 to 4 times per year    Active Member of Clubs or Organizations: No    Attends  Archivist Meetings: 1 to 4 times per year    Marital Status: Married  Human resources officer Violence: Not At Risk (06/22/2022)   Humiliation, Afraid, Rape, and Kick questionnaire    Fear of Current or Ex-Partner: No    Emotionally Abused: No    Physically Abused: No    Sexually Abused: No    Family History  Problem Relation Age of Onset   Hepatitis C Mother 24       d/c at 58   Hypertension Father    Hepatitis C Brother    Breast cancer Neg Hx      Current Outpatient Medications:    DULoxetine (CYMBALTA) 30 MG capsule, Take 1 capsule (30 mg total) by mouth daily for 14 days, THEN 2 capsules (60 mg total) daily for 14 days., Disp: 42 capsule, Rfl: 0   lidocaine-prilocaine (EMLA) cream, Apply to affected area once, Disp: 30 g, Rfl: 3   lisinopril (ZESTRIL) 40 MG tablet, Take 1 tablet (40 mg total) by mouth daily., Disp: 30 tablet, Rfl:  3   Multiple Vitamins-Minerals (MULTIVITAMIN GUMMIES ADULTS PO), Take by mouth daily., Disp: , Rfl:    SYSTANE COMPLETE 0.6 % SOLN, Apply 1 drop to eye daily., Disp: , Rfl:    traMADol (ULTRAM) 50 MG tablet, TAKE 1 TABLET(50 MG) BY MOUTH DAILY, Disp: 30 tablet, Rfl: 3   amLODipine (NORVASC) 5 MG tablet, Take 1 tablet (5 mg total) by mouth daily. (Patient not taking: Reported on 11/19/2022), Disp: 30 tablet, Rfl: 3   diazepam (VALIUM) 5 MG tablet, TAKE 1 TABLET(5 MG) BY MOUTH EVERY 6 HOURS AS NEEDED FOR ANXIETY (Patient not taking: Reported on 11/19/2022), Disp: 30 tablet, Rfl: 3   Lidocaine-Hydrocort, Perianal, 3-0.5 % CREA, Apply topically. (Patient not taking: Reported on 11/19/2022), Disp: , Rfl:    ondansetron (ZOFRAN) 8 MG tablet, Take 1 tablet (8 mg total) by mouth every 8 (eight) hours as needed for nausea or vomiting. (Patient not taking: Reported on 09/17/2022), Disp: 60 tablet, Rfl: 2   ondansetron (ZOFRAN-ODT) 8 MG disintegrating tablet, Take 8 mg by mouth every 8 (eight) hours as needed. (Patient not taking: Reported on 09/17/2022), Disp: ,  Rfl:    oxyCODONE (OXY IR/ROXICODONE) 5 MG immediate release tablet, Take 1 tablet (5 mg total) by mouth every 8 (eight) hours. (Patient not taking: Reported on 11/19/2022), Disp: 20 tablet, Rfl: 0   prochlorperazine (COMPAZINE) 10 MG tablet, Take 1 tablet (10 mg total) by mouth every 6 (six) hours as needed for nausea or vomiting. (Patient not taking: Reported on 11/19/2022), Disp: 60 tablet, Rfl: 2   rosuvastatin (CRESTOR) 10 MG tablet, Take 1 tablet (10 mg total) by mouth daily. (Patient not taking: Reported on 09/17/2022), Disp: 90 tablet, Rfl: 3  Physical exam:  There were no vitals filed for this visit.  Physical Exam Neurological:     Mental Status: She is alert and oriented to person, place, and time.  Psychiatric:        Behavior: Behavior normal.    Assessment and plan- Patient is a 62 y.o. female   Endometrial Cancer- s/p 6 cycles carbo-taxol and vaginal brachytherapy. Continue surveillance as previously discussed.  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 Unable to tolerate gabapentin or pregabalin. Declined duloxetine trial. Is having some improvement with massage, chiropractic and acupuncture. Continue. Can consider duloxetine in the future if needed.  Chemotherapy related fatigue- Recommended energy conservation, rest when needed, exercise/activity as tolerated.   Disposition: Follow up with Gyn-Onc and Med onc as scheduled RTC in interim as needed- la   Visit Diagnosis 1. Chemotherapy-induced peripheral neuropathy ([place])    I discussed the assessment and treatment plan with the patient. The patient was provided an opportunity to ask questions and all were answered. The patient agreed with the plan and demonstrated an understanding of the instructions.   The patient was advised to call back or seek an in-person evaluation if the symptoms worsen or if the condition fails to improve as anticipated.    I spent 15 minutes  on this telephone encounter.   Thank you for allowing me to participate in the care of this very pleasant patient.   Beckey Rutter, DNP, AGNP-C, [facility] [place]

## 2022-12-18 ENCOUNTER — Ambulatory Visit: Payer: Self-pay

## 2022-12-18 DIAGNOSIS — M6281 Muscle weakness (generalized): Secondary | ICD-10-CM

## 2022-12-18 DIAGNOSIS — R2681 Unsteadiness on feet: Secondary | ICD-10-CM

## 2022-12-18 NOTE — Therapy (Signed)
OUTPATIENT PHYSICAL THERAPY BALANCE/DECONDITINIONG TREATMENT   Patient Name: Jaime Johnson MRN: [id number] DOB:01-26-1961, 62 y.o., female Today's Date: 12/19/2022  END OF SESSION:  PT End of Session - 12/18/22 1447     Visit Number 3    Number of Visits 25    Date for PT Re-Evaluation 03/05/23    Authorization Type eval: 12/11/22;    PT Start Time 1447    PT Stop Time 1530    PT Time Calculation (min) 43 min    Activity Tolerance Patient tolerated treatment well    Behavior During Therapy [facility] for tasks assessed/performed            Past Medical History:  Diagnosis Date   Allergic rhinitis due to pollen    Endometrial cancer ([place])    Hyperlipidemia LDL goal <100 02/16/2020   Hypertension 12/19/2016   Past Surgical History:  Procedure Laterality Date   CESAREAN SECTION     COLONOSCOPY WITH PROPOFOL N/A 11/12/2016   Procedure: COLONOSCOPY WITH PROPOFOL;  Surgeon: Lucilla Lame, MD;  Location: [place];  Service: Endoscopy;  Laterality: N/A;   COLONOSCOPY WITH PROPOFOL N/A 03/29/2017   Procedure: COLONOSCOPY WITH PROPOFOL;  Surgeon: Lucilla Lame, MD;  Location: [facility] [place];  Service: Endoscopy;  Laterality: N/A;   COLONOSCOPY WITH PROPOFOL N/A 03/14/2020   Procedure: COLONOSCOPY WITH PROPOFOL;  Surgeon: Lucilla Lame, MD;  Location: [place];  Service: Endoscopy;  Laterality: N/A;  priority 4   IR IMAGING GUIDED PORT INSERTION  06/01/2022   POLYPECTOMY  11/12/2016   Procedure: POLYPECTOMY;  Surgeon: Lucilla Lame, MD;  Location: [facility];  Service: Endoscopy;;   POLYPECTOMY  03/29/2017   Procedure: POLYPECTOMY;  Surgeon: Lucilla Lame, MD;  Location: [facility];  Service: Endoscopy;;   Patient Active Problem List   Diagnosis Date Noted   Colon cancer ([place]) 05/22/2022   Endometrial carcinoma ([place]) 03/09/2022   Foraminal stenosis of cervical region (RIGHT) 03/16/2020   Hyperlipidemia LDL goal <100 02/16/2020   Cervical radicular pain  (RIGHT) 09/29/2019   Bilateral carpal tunnel syndrome 09/29/2019   Essential hypertension 12/19/2016   Allergic rhinitis due to pollen    PCP: Owens Loffler MD  REFERRING PROVIDER: Beckey Rutter NP  REFERRING DIAG: Endometrial carcinoma  RATIONALE FOR EVALUATION AND TREATMENT: Rehabilitation  THERAPY DIAG: Muscle weakness (generalized)  Unsteadiness on feet  ONSET DATE:   FOLLOW-UP APPT SCHEDULED WITH REFERRING PROVIDER: Yes, radiology this Thursday.   From initial evaluation note: SUBJECTIVE:  SUBJECTIVE STATEMENT:  General deconditioning  PERTINENT HISTORY:  Patient is 62 year old female diagnosed with endometrial cancer, s/p 6 cycles of carbo-taxol completed 10/08/22, and vaginal brachytherapy completed 10/29/22. She has been complaining of leg swelling, numbness, and pain of the toes/feet. Previously took gabapentin and lyrica for her symptoms but was unable to tolerate d/t side effects. Compression leggings have helped somewhat. She also complains today of hip and BLE weakness. Pt reports BLE shaking and low back/sacral pain as well as general joint stiffness. She reports that when she was diagnosed her cancer was initially classified as stage 1 but after lymph node testing they found evidence in the lymph channels and reclassified her to stage 3. Her next scan is during the third week of March.  Pain: No, no pain currently but occasional low back and bilateral hip pain; Numbness/Tingling: Yes, numbness, tingling, stabbling, and burning in stocking distribution BLE; Focal Weakness: Yes, BLE LE weakness Prior history of physical therapy for balance:  No Dominant hand: right Red flags: Positive for history of endometrial cancer. Negative for bowel/bladder changes, saddle paresthesia, h/o spinal  tumors, h/o compression fx, h/o abdominal aneurysm, abdominal pain, chills/fever, night sweats, nausea, and vomiting.  PRECAUTIONS: None  WEIGHT BEARING RESTRICTIONS: No  FALLS: Has patient fallen in last 6 months? No,   Living Environment Lives with: lives with their spouse Lives in: House/apartment Stairs:  1 step to enter , 2 level house (attic upstairs, 1 flight of stairs with handrail); Has following equipment at home: Single point cane, Walker - 2 wheeled, and Shower bench  Prior level of function: Independent, husband assists with IADLs if needed;  Occupational demands: Stopped working at [employer] prior to 2020;  Hobbies: walking, working in her garden;   Patient Goals: "I would like to be able to stretch and walk without pain"   OBJECTIVE:   Patient Surveys  FOTO: 50, predicted improvement to 16 ABC: To be completed  Cognition Patient is oriented to person, place, and time.  Recent memory is intact.  Remote memory is intact.  Attention span and concentration are intact.  Expressive speech is intact.  Patient's fund of knowledge is within normal limits for educational level.    Gross Musculoskeletal Assessment Tremor: None Bulk: Normal Tone: Normal No pitting edema noted in BLE  GAIT: Deferred full gait assessment but decrease self-selected speed;  Posture: No gross abnormalities noted in standing or seated posture  AROM Deferred  LE MMT: MMT (out of 5) Right  Left   Hip flexion 4 4  Hip extension    Hip abduction (seated) 4 4  Hip adduction (seated) 4 4  Hip internal rotation 4 4  Hip external rotation 4 4  Knee flexion 4 4  Knee extension 5 5  Ankle dorsiflexion 4 4  Ankle plantarflexion    Ankle inversion    Ankle eversion    (* = pain; Blank rows = not tested)  Sensation Deferred  Reflexes Deferred  Cranial Nerves Deferred  Coordination/Cerebellar Deferred  FUNCTIONAL OUTCOME MEASURES  Results Comments   BERG    DGI    FGA    TUG    5TSTS 33.0 seconds   2 Minute Walk Test    10 Meter Gait Speed    (Blank rows = not tested)   TODAY'S TREATMENT   SUBJECTIVE: Pt reports that she is doing well today. She has been walking on her treadmill at home and doing her exercises. She does feel like she  has gained some strength. She continues with low back pain with extended standing/exercise but no resting back pain upon arrival today.    PAIN: Denies resting pain;    Neuromuscular Re-education  2MWT: 40' with hand held assist;  Pre: 156/83 mmHg, 78 bpm, SpO2: 100%; Post: 150/73 mmHg, 87 bpm, SpO2: 98%,  Modified BORG Dyspnea: 5/10;  RPE: 5/10;  BERG: 53/56;   Ther-ex  Sit to stand without UE support 2 x 10; Standing hip flexion with 3# ankle weights (AW) x 10 BLE; Standing heel raises with BUE support x 10; Seated LAQ with 3# AW x 10 BLE; Seated clams with manual resistance from therapist x 10; Seated adductor squeeze with manual resistance from therapist x 10; NuStep L0-1 x 8 minutes at end of session with therapist monitoring fatigue and adjusting resistance appropriately.   PATIENT EDUCATION:  Education details: Pt educated throughout session about proper posture and technique with exercises. Improved exercise technique, movement at target joints, use of target muscles after min to mod verbal, visual, tactile cues. Educated about outcome measures.  Person educated: Patient Education method: Explanation Education comprehension: verbalized understanding   HOME EXERCISE PROGRAM: Access Code: [email or web address] URL: [email or web address] Date: 12/13/2022 Prepared by: Merdis Delay  Exercises - Sit to Stand Without Arm Support  - 1 x daily - 7 x weekly - 3 sets - 5 reps - Seated Hip Abduction with Resistance  - 1 x daily - 7 x weekly - 3 sets - 5 reps - Seated Isometric Hip Adduction with Ball  - 1 x daily - 7 x weekly - 3 sets - 5 reps - Heel Raises with Counter  Support  - 1 x daily - 7 x weekly - 2 sets - 10 reps   ASSESSMENT:  CLINICAL IMPRESSION: Updated additional outcome measures with patient during visit today. She scored a 53/56 on the BERG indicating reasonably good balance. She does have deficits in endurance as she is only able to ambulate 76' with hand held assist during the 2MWT. She reports 5/10 exertion on the RPE afterward as well as 5/10 on the BORG Dyspnea Scale. Additional time during session spent on both seated and standing strengthening exercises. Overall she continues to demonstrate profound weakness and fatigue although she has excellent motivation to improve. Pt encouraged to continue HEP and no modifications made at this time. Will progress strengthening at future sessions. Pt will benefit from PT services to address deficits in strength, balance, and endurance in order to return to full function at home.   OBJECTIVE IMPAIRMENTS: decreased balance, decreased strength, and pain.   ACTIVITY LIMITATIONS: lifting, bending, sitting, standing, and squatting  PARTICIPATION LIMITATIONS: meal prep, cleaning, laundry, shopping, community activity, and yard work  PERSONAL FACTORS: Age, Past/current experiences, Time since onset of injury/illness/exacerbation, and 1-2 comorbidities: endometrial cancer and hypertension  are also affecting patient's functional outcome.   REHAB POTENTIAL: Good  CLINICAL DECISION MAKING: Evolving/moderate complexity  EVALUATION COMPLEXITY: Moderate   GOALS: Goals reviewed with patient? No  SHORT TERM GOALS: Target date: 01/22/2023   Pt will be independent with HEP in order to improve strength and balance in order to decrease fall risk and improve function at home. Baseline:  Goal status: INITIAL   LONG TERM GOALS: Target date: 03/05/23  Pt will increase FOTO to at least 63 to demonstrate significant improvement in function at home related to balance and leg strength. Baseline: 12/11/22: 50 Goal  status: INITIAL  2.  Pt will improve BERG by at least 3  points in order to demonstrate clinically significant improvement in balance.   Baseline: 12/11/22: To be completed; 12/18/22: 53/56; Goal status: INITIAL  3. Pt will decrease TUG to below 14 seconds in order to demonstrate decreased fall risk.    Baseline: 12/11/22: 33.0s; Goal status: INITIAL  4. Pt will improve 30s Sit to Stand Test by at least 2 repetitions in order to demonstrate improved BLE endurance and improve her ability to walk and work in the yard.     Baseline: 12/11/22: To be completed; 12/13/22: 4 reps; Goal status: INITIAL  5. Pt will increase 2MWT by at least 69f in order to demonstrate clinically significant improvement in cardiopulmonary endurance and community ambulation   Baseline: 12/18/22: 249' with hand held assist (5/10 RPE, 5/10 BORG Dyspnea); Goal status: INITIAL   PLAN: PT FREQUENCY: 2x/week  PT DURATION: 12 weeks  PLANNED INTERVENTIONS: Therapeutic exercises, Therapeutic activity, Neuromuscular re-education, Balance training, Gait training, Patient/Family education, Joint manipulation, Joint mobilization, Canalith repositioning, Aquatic Therapy, Dry Needling, Cognitive remediation, Electrical stimulation, Spinal manipulation, Spinal mobilization, Cryotherapy, Moist heat, Traction, Ultrasound, Ionotophoresis '4mg'$ /ml Dexamethasone, and Manual therapy  PLAN FOR NEXT SESSION: Sensation testing, progress strengthening, incorporate balance exercises as necessary;   JLyndel SafeHuprich PT, DPT, GCS  Shantinique Picazo 12/19/2022, 12:40 PM

## 2022-12-20 ENCOUNTER — Ambulatory Visit: Payer: Self-pay

## 2022-12-20 DIAGNOSIS — M6281 Muscle weakness (generalized): Secondary | ICD-10-CM

## 2022-12-20 DIAGNOSIS — R2681 Unsteadiness on feet: Secondary | ICD-10-CM

## 2022-12-20 NOTE — Therapy (Signed)
OUTPATIENT PHYSICAL THERAPY BALANCE/DECONDITINIONG TREATMENT   Patient Name: Jaime Johnson MRN: [id number] DOB:1961/02/08, 62 y.o., female Today's Date: 12/20/2022  END OF SESSION:  PT End of Session - 12/20/22 1458     Visit Number 4    Number of Visits 25    Date for PT Re-Evaluation 03/05/23    Authorization Type eval: 12/11/22;    PT Start Time 1445    PT Stop Time 1530    PT Time Calculation (min) 45 min    Activity Tolerance Patient tolerated treatment well    Behavior During Therapy [facility] for tasks assessed/performed            Past Medical History:  Diagnosis Date   Allergic rhinitis due to pollen    Endometrial cancer ([place])    Hyperlipidemia LDL goal <100 02/16/2020   Hypertension 12/19/2016   Past Surgical History:  Procedure Laterality Date   CESAREAN SECTION     COLONOSCOPY WITH PROPOFOL N/A 11/12/2016   Procedure: COLONOSCOPY WITH PROPOFOL;  Surgeon: Lucilla Lame, MD;  Location: [place];  Service: Endoscopy;  Laterality: N/A;   COLONOSCOPY WITH PROPOFOL N/A 03/29/2017   Procedure: COLONOSCOPY WITH PROPOFOL;  Surgeon: Lucilla Lame, MD;  Location: [place];  Service: Endoscopy;  Laterality: N/A;   COLONOSCOPY WITH PROPOFOL N/A 03/14/2020   Procedure: COLONOSCOPY WITH PROPOFOL;  Surgeon: Lucilla Lame, MD;  Location: [place];  Service: Endoscopy;  Laterality: N/A;  priority 4   IR IMAGING GUIDED PORT INSERTION  06/01/2022   POLYPECTOMY  11/12/2016   Procedure: POLYPECTOMY;  Surgeon: Lucilla Lame, MD;  Location: [place];  Service: Endoscopy;;   POLYPECTOMY  03/29/2017   Procedure: POLYPECTOMY;  Surgeon: Lucilla Lame, MD;  Location: [place];  Service: Endoscopy;;   Patient Active Problem List   Diagnosis Date Noted   Colon cancer ([place]) 05/22/2022   Endometrial carcinoma ([place]) 03/09/2022   Foraminal stenosis of cervical region (RIGHT) 03/16/2020   Hyperlipidemia LDL goal <100 02/16/2020   Cervical radicular pain  (RIGHT) 09/29/2019   Bilateral carpal tunnel syndrome 09/29/2019   Essential hypertension 12/19/2016   Allergic rhinitis due to pollen    PCP: Owens Loffler MD  REFERRING PROVIDER: Beckey Rutter NP  REFERRING DIAG: Endometrial carcinoma  RATIONALE FOR EVALUATION AND TREATMENT: Rehabilitation  THERAPY DIAG: Muscle weakness (generalized)  Unsteadiness on feet  ONSET DATE:   FOLLOW-UP APPT SCHEDULED WITH REFERRING PROVIDER: Yes, radiology this Thursday.   From initial evaluation note: SUBJECTIVE:  SUBJECTIVE STATEMENT:  General deconditioning  PERTINENT HISTORY:  Patient is 62 year old female diagnosed with endometrial cancer, s/p 6 cycles of carbo-taxol completed 10/08/22, and vaginal brachytherapy completed 10/29/22. She has been complaining of leg swelling, numbness, and pain of the toes/feet. Previously took gabapentin and lyrica for her symptoms but was unable to tolerate d/t side effects. Compression leggings have helped somewhat. She also complains today of hip and BLE weakness. Pt reports BLE shaking and low back/sacral pain as well as general joint stiffness. She reports that when she was diagnosed her cancer was initially classified as stage 1 but after lymph node testing they found evidence in the lymph channels and reclassified her to stage 3. Her next scan is during the third week of March.  Pain: No, no pain currently but occasional low back and bilateral hip pain; Numbness/Tingling: Yes, numbness, tingling, stabbling, and burning in stocking distribution BLE; Focal Weakness: Yes, BLE LE weakness Prior history of physical therapy for balance:  No Dominant hand: right Red flags: Positive for history of endometrial cancer. Negative for bowel/bladder changes, saddle paresthesia, h/o spinal  tumors, h/o compression fx, h/o abdominal aneurysm, abdominal pain, chills/fever, night sweats, nausea, and vomiting.  PRECAUTIONS: None  WEIGHT BEARING RESTRICTIONS: No  FALLS: Has patient fallen in last 6 months? No,   Living Environment Lives with: lives with their spouse Lives in: House/apartment Stairs:  1 step to enter , 2 level house (attic upstairs, 1 flight of stairs with handrail); Has following equipment at home: Single point cane, Walker - 2 wheeled, and Shower bench  Prior level of function: Independent, husband assists with IADLs if needed;  Occupational demands: Stopped working at [employer] prior to 2020;  Hobbies: walking, working in her garden;   Patient Goals: "I would like to be able to stretch and walk without pain"   OBJECTIVE:   Patient Surveys  FOTO: 50, predicted improvement to 63 ABC: To be completed  Cognition Patient is oriented to person, place, and time.  Recent memory is intact.  Remote memory is intact.  Attention span and concentration are intact.  Expressive speech is intact.  Patient's fund of knowledge is within normal limits for educational level.    Gross Musculoskeletal Assessment Tremor: None Bulk: Normal Tone: Normal No pitting edema noted in BLE  GAIT: Deferred full gait assessment but decrease self-selected speed;  Posture: No gross abnormalities noted in standing or seated posture  AROM Deferred  LE MMT: MMT (out of 5) Right  Left   Hip flexion 4 4  Hip extension    Hip abduction (seated) 4 4  Hip adduction (seated) 4 4  Hip internal rotation 4 4  Hip external rotation 4 4  Knee flexion 4 4  Knee extension 5 5  Ankle dorsiflexion 4 4  Ankle plantarflexion    Ankle inversion    Ankle eversion    (* = pain; Blank rows = not tested)  Sensation Deferred  Reflexes Deferred  Cranial Nerves Deferred  Coordination/Cerebellar Deferred  FUNCTIONAL OUTCOME MEASURES  Results Comments   BERG    DGI    FGA    TUG    5TSTS 33.0 seconds   2 Minute Walk Test    10 Meter Gait Speed    (Blank rows = not tested)   TODAY'S TREATMENT   SUBJECTIVE: Pt reports that she is doing well today. She walked .4 miles on her treadmill at home while holding on.  It took her about 52  minutes.  She doesn't think she could have done it if she wasn't holding on.     PAIN: Denies resting pain;    Ther-ex   Front step ups: 6 inch height, x12 R and x12 L, b/l UE support Sit to stand without UE support 2 x 10; holding 5 lb med bal Standing hip flexion with 3# ankle weights (AW) x 15 BLE; Standing heel raises with BUE support 2 x 10; Seated LAQ with 3# AW x 15 BLE; Seated clams with manual resistance from therapist x 10; Seated adductor squeeze with manual resistance from therapist x 10; NuStep L2-4 x 8 minutes at end of session with therapist monitoring fatigue and adjusting resistance appropriately. (This was a progression in intensity from last visit)   Neuromuscular Re-education  Blue ladder on floor: step high knees into square forward and laterally 3x ea, focusing on foot placement/LE coordination Reverse walking with holding PT hand support 2x20 ft   PATIENT EDUCATION:  Education details: Pt educated throughout session about proper posture and technique with exercises. Improved exercise technique, movement at target joints, use of target muscles after min to mod verbal, visual, tactile cues. Educated about outcome measures.  Person educated: Patient Education method: Explanation Education comprehension: verbalized understanding   HOME EXERCISE PROGRAM: Access Code: [email or web address] URL: [email or web address] Date: 12/13/2022 Prepared by: Merdis Delay  Exercises - Sit to Stand Without Arm Support  - 1 x daily - 7 x weekly - 3 sets - 5 reps - Seated Hip Abduction with Resistance  - 1 x daily - 7 x weekly - 3 sets - 5 reps - Seated Isometric Hip Adduction with  Ball  - 1 x daily - 7 x weekly - 3 sets - 5 reps - Heel Raises with Counter Support  - 1 x daily - 7 x weekly - 2 sets - 10 reps   ASSESSMENT:  CLINICAL IMPRESSION: Progressed LE strengthening exercises today and also grouped tx into 15 min increments of consecutive standing to work on improving overall endurance.  She had difficulty with LE proprioception and lacks hip flexor strength during high marches on blue ladder.  No loss of balance, but did require intermittent UE support with PT.  She has excellent motivation to improve and tolerated session well.  Pt encouraged to continue HEP and no modifications made at this time. Pt will benefit from PT services to address deficits in strength, balance, and endurance in order to return to full function at home.   OBJECTIVE IMPAIRMENTS: decreased balance, decreased strength, and pain.   ACTIVITY LIMITATIONS: lifting, bending, sitting, standing, and squatting  PARTICIPATION LIMITATIONS: meal prep, cleaning, laundry, shopping, community activity, and yard work  PERSONAL FACTORS: Age, Past/current experiences, Time since onset of injury/illness/exacerbation, and 1-2 comorbidities: endometrial cancer and hypertension  are also affecting patient's functional outcome.   REHAB POTENTIAL: Good  CLINICAL DECISION MAKING: Evolving/moderate complexity  EVALUATION COMPLEXITY: Moderate   GOALS: Goals reviewed with patient? No  SHORT TERM GOALS: Target date: 01/22/2023   Pt will be independent with HEP in order to improve strength and balance in order to decrease fall risk and improve function at home. Baseline:  Goal status: INITIAL   LONG TERM GOALS: Target date: 03/05/23  Pt will increase FOTO to at least 63 to demonstrate significant improvement in function at home related to balance and leg strength. Baseline: 12/11/22: 50 Goal status: INITIAL  2.  Pt will improve BERG by at least 3 points in order to demonstrate clinically significant  improvement in balance.   Baseline: 12/11/22: To be completed; 12/18/22: 53/56; Goal status: INITIAL  3. Pt will decrease TUG to below 14 seconds in order to demonstrate decreased fall risk.    Baseline: 12/11/22: 33.0s; Goal status: INITIAL  4. Pt will improve 30s Sit to Stand Test by at least 2 repetitions in order to demonstrate improved BLE endurance and improve her ability to walk and work in the yard.     Baseline: 12/11/22: To be completed; 12/13/22: 4 reps; Goal status: INITIAL  5. Pt will increase 2MWT by at least 85f in order to demonstrate clinically significant improvement in cardiopulmonary endurance and community ambulation   Baseline: 12/18/22: 249' with hand held assist (5/10 RPE, 5/10 BORG Dyspnea); Goal status: INITIAL   PLAN: PT FREQUENCY: 2x/week  PT DURATION: 12 weeks  PLANNED INTERVENTIONS: Therapeutic exercises, Therapeutic activity, Neuromuscular re-education, Balance training, Gait training, Patient/Family education, Joint manipulation, Joint mobilization, Canalith repositioning, Aquatic Therapy, Dry Needling, Cognitive remediation, Electrical stimulation, Spinal manipulation, Spinal mobilization, Cryotherapy, Moist heat, Traction, Ultrasound, Ionotophoresis '4mg'$ /ml Dexamethasone, and Manual therapy  PLAN FOR NEXT SESSION: Sensation testing, progress strengthening, incorporate balance exercises as necessary;   RMerdis Delay PT, DPT, OCS  #[id number] RPincus Badder2/29/2024, 3:01 PM

## 2022-12-25 ENCOUNTER — Encounter: Payer: Self-pay | Admitting: *Deleted

## 2022-12-25 ENCOUNTER — Ambulatory Visit: Payer: Self-pay | Attending: Nurse Practitioner

## 2022-12-25 DIAGNOSIS — M6281 Muscle weakness (generalized): Secondary | ICD-10-CM | POA: Insufficient documentation

## 2022-12-25 DIAGNOSIS — R2681 Unsteadiness on feet: Secondary | ICD-10-CM | POA: Insufficient documentation

## 2022-12-25 NOTE — Therapy (Unsigned)
OUTPATIENT PHYSICAL THERAPY BALANCE/DECONDITINIONG TREATMENT  Patient Name: Jaime Johnson MRN: [id number] DOB:[birth year], 62 y.o., female Today's Date: 12/26/2022  END OF SESSION:  PT End of Session - 12/25/22 1450     Visit Number 5    Number of Visits 25    Date for PT Re-Evaluation 03/05/23    Authorization Type eval: 12/11/22;    PT Start Time 1445    PT Stop Time 1530    PT Time Calculation (min) 45 min    Activity Tolerance Patient tolerated treatment well    Behavior During Therapy [facility] for tasks assessed/performed            Past Medical History:  Diagnosis Date   Allergic rhinitis due to pollen    Endometrial cancer ([place])    Hyperlipidemia LDL goal <100 02/16/2020   Hypertension 12/19/2016   Past Surgical History:  Procedure Laterality Date   CESAREAN SECTION     COLONOSCOPY WITH PROPOFOL N/A 11/12/2016   Procedure: COLONOSCOPY WITH PROPOFOL;  Surgeon: Lucilla Lame, MD;  Location: [place];  Service: Endoscopy;  Laterality: N/A;   COLONOSCOPY WITH PROPOFOL N/A 03/29/2017   Procedure: COLONOSCOPY WITH PROPOFOL;  Surgeon: Lucilla Lame, MD;  Location: [place];  Service: Endoscopy;  Laterality: N/A;   COLONOSCOPY WITH PROPOFOL N/A 03/14/2020   Procedure: COLONOSCOPY WITH PROPOFOL;  Surgeon: Lucilla Lame, MD;  Location: [place];  Service: Endoscopy;  Laterality: N/A;  priority 4   IR IMAGING GUIDED PORT INSERTION  06/01/2022   POLYPECTOMY  11/12/2016   Procedure: POLYPECTOMY;  Surgeon: Lucilla Lame, MD;  Location: [place];  Service: Endoscopy;;   POLYPECTOMY  03/29/2017   Procedure: POLYPECTOMY;  Surgeon: Lucilla Lame, MD;  Location: [place];  Service: Endoscopy;;   Patient Active Problem List   Diagnosis Date Noted   Colon cancer ([place]) 05/22/2022   Endometrial carcinoma ([place]) 03/09/2022   Foraminal stenosis of cervical region (RIGHT) 03/16/2020   Hyperlipidemia LDL goal <100 02/16/2020   Cervical radicular pain  (RIGHT) 09/29/2019   Bilateral carpal tunnel syndrome 09/29/2019   Essential hypertension 12/19/2016   Allergic rhinitis due to pollen    PCP: Owens Loffler MD  REFERRING PROVIDER: Beckey Rutter NP  REFERRING DIAG: Endometrial carcinoma  RATIONALE FOR EVALUATION AND TREATMENT: Rehabilitation  THERAPY DIAG: Muscle weakness (generalized)  Unsteadiness on feet  ONSET DATE:   FOLLOW-UP APPT SCHEDULED WITH REFERRING PROVIDER: Yes, radiology this Thursday.   From initial evaluation note: SUBJECTIVE:  SUBJECTIVE STATEMENT:  General deconditioning  PERTINENT HISTORY:  Patient is 62 year old female diagnosed with endometrial cancer, s/p 6 cycles of carbo-taxol completed 10/08/22, and vaginal brachytherapy completed 10/29/22. She has been complaining of leg swelling, numbness, and pain of the toes/feet. Previously took gabapentin and lyrica for her symptoms but was unable to tolerate d/t side effects. Compression leggings have helped somewhat. She also complains today of hip and BLE weakness. Pt reports BLE shaking and low back/sacral pain as well as general joint stiffness. She reports that when she was diagnosed her cancer was initially classified as stage 1 but after lymph node testing they found evidence in the lymph channels and reclassified her to stage 3. Her next scan is during the third week of March.  Pain: No, no pain currently but occasional low back and bilateral hip pain; Numbness/Tingling: Yes, numbness, tingling, stabbling, and burning in stocking distribution BLE; Focal Weakness: Yes, BLE LE weakness Prior history of physical therapy for balance:  No Dominant hand: right Red flags: Positive for history of endometrial cancer. Negative for bowel/bladder changes, saddle paresthesia, h/o spinal  tumors, h/o compression fx, h/o abdominal aneurysm, abdominal pain, chills/fever, night sweats, nausea, and vomiting.  PRECAUTIONS: None  WEIGHT BEARING RESTRICTIONS: No  FALLS: Has patient fallen in last 6 months? No,   Living Environment Lives with: lives with their spouse Lives in: House/apartment Stairs:  1 step to enter , 2 level house (attic upstairs, 1 flight of stairs with handrail); Has following equipment at home: Single point cane, Walker - 2 wheeled, and Shower bench  Prior level of function: Independent, husband assists with IADLs if needed;  Occupational demands: Stopped working at [employer] prior to 2020;  Hobbies: walking, working in her garden;   Patient Goals: "I would like to be able to stretch and walk without pain"   OBJECTIVE:   Patient Surveys  FOTO: 50, predicted improvement to 11 ABC: To be completed  Cognition Patient is oriented to person, place, and time.  Recent memory is intact.  Remote memory is intact.  Attention span and concentration are intact.  Expressive speech is intact.  Patient's fund of knowledge is within normal limits for educational level.    Gross Musculoskeletal Assessment Tremor: None Bulk: Normal Tone: Normal No pitting edema noted in BLE  GAIT: Deferred full gait assessment but decrease self-selected speed;  Posture: No gross abnormalities noted in standing or seated posture  AROM Deferred  LE MMT: MMT (out of 5) Right  Left   Hip flexion 4 4  Hip extension    Hip abduction (seated) 4 4  Hip adduction (seated) 4 4  Hip internal rotation 4 4  Hip external rotation 4 4  Knee flexion 4 4  Knee extension 5 5  Ankle dorsiflexion 4 4  Ankle plantarflexion    Ankle inversion    Ankle eversion    (* = pain; Blank rows = not tested)  Sensation Deferred  Reflexes Deferred  Cranial Nerves Deferred  Coordination/Cerebellar Deferred  FUNCTIONAL OUTCOME MEASURES  Results Comments   BERG    DGI    FGA    TUG    5TSTS 33.0 seconds   2 Minute Walk Test    10 Meter Gait Speed    (Blank rows = not tested)   TODAY'S TREATMENT    SUBJECTIVE: Pt reports that she is doing well today. She thinks she may have "overdone" it a little bit at home. However, she has noticed some  mild improvement in her strength. She continues to report that she gets fatigued easily.   PAIN: Denies resting pain;    Ther-ex  NuStep L2-4 x 6 minutes at start of session for LE strengthening and cardiovascular conditioning with therapist adjusting resistance, incorporated a few short bouts of higher intensity; Standing hip flexion with 3# ankle weights (AW) x 15 BLE; Standing heel raises with BUE support x 15; Seated LAQ with 3# AW x 15 BLE; Seated clams with manual resistance from therapist 2 x 10; Seated adductor squeeze with manual resistance from therapist 2 x 10; Sit to stand from regular height chair without UE support x 10;   Neuromuscular Re-education  Tandem balance alternating forward LE x 30s each; Tandem gait in // bars x multiple lengths; Airex feet together eyes open/closed x 30s each; Airex feet together horizontal and vertical head turns x 30s each;   PATIENT EDUCATION:  Education details: Pt educated throughout session about proper posture and technique with exercises. Improved exercise technique, movement at target joints, use of target muscles after min to mod verbal, visual, tactile cues. Person educated: Patient Education method: Explanation Education comprehension: verbalized understanding   HOME EXERCISE PROGRAM: Access Code: [email or web address] URL: [email or web address] Date: 12/25/2022 Prepared by: Roxana Hires  Exercises - Sit to Stand Without Arm Support  - 1 x daily - 7 x weekly - 3 sets - 5 reps - Seated Hip Abduction with Resistance  - 1 x daily - 7 x weekly - 3 sets - 5 reps - Seated Isometric Hip Adduction with Ball  - 1 x daily - 7 x weekly  - 3 sets - 5 reps - Heel Raises with Counter Support  - 1 x daily - 7 x weekly - 2 sets - 10 reps - Feet Together Balance at [employer] Eyes Closed  - 1 x daily - 7 x weekly - 3 reps - 30s hold - Romberg Stance with Head Rotation  - 1 x daily - 7 x weekly - 3 reps - 30s hold   ASSESSMENT:  CLINICAL IMPRESSION: Pt demonstrates excellent motivation throughout session. Progressed LE strengthening and balance exercises today. Intermittent seated rest breaks provided throughout session. Updated HEP to include balance exercises. Education provided regarding timeframe for neuromuscular adaptation including muscle recruitment and hypertrophy. Pt encouraged to follow-up as scheduled. Plan is to progress strength and balance exercises at future sessions. She will benefit from PT services to address deficits in strength, balance, and endurance in order to return to full function at home.   OBJECTIVE IMPAIRMENTS: decreased balance, decreased strength, and pain.   ACTIVITY LIMITATIONS: lifting, bending, sitting, standing, and squatting  PARTICIPATION LIMITATIONS: meal prep, cleaning, laundry, shopping, community activity, and yard work  PERSONAL FACTORS: Age, Past/current experiences, Time since onset of injury/illness/exacerbation, and 1-2 comorbidities: endometrial cancer and hypertension  are also affecting patient's functional outcome.   REHAB POTENTIAL: Good  CLINICAL DECISION MAKING: Evolving/moderate complexity  EVALUATION COMPLEXITY: Moderate   GOALS: Goals reviewed with patient? No  SHORT TERM GOALS: Target date: 01/22/2023   Pt will be independent with HEP in order to improve strength and balance in order to decrease fall risk and improve function at home. Baseline:  Goal status: INITIAL   LONG TERM GOALS: Target date: 03/05/23  Pt will increase FOTO to at least 63 to demonstrate significant improvement in function at home related to balance and leg strength. Baseline: 12/11/22:  50 Goal status: INITIAL  2.  Pt will improve BERG by at least  3 points in order to demonstrate clinically significant improvement in balance.   Baseline: 12/11/22: To be completed; 12/18/22: 53/56; Goal status: INITIAL  3. Pt will decrease TUG to below 14 seconds in order to demonstrate decreased fall risk.    Baseline: 12/11/22: 33.0s; Goal status: INITIAL  4. Pt will improve 30s Sit to Stand Test by at least 2 repetitions in order to demonstrate improved BLE endurance and improve her ability to walk and work in the yard.     Baseline: 12/11/22: To be completed; 12/13/22: 4 reps; Goal status: INITIAL  5. Pt will increase 2MWT by at least 35f in order to demonstrate clinically significant improvement in cardiopulmonary endurance and community ambulation   Baseline: 12/18/22: 249' with hand held assist (5/10 RPE, 5/10 BORG Dyspnea); Goal status: INITIAL   PLAN: PT FREQUENCY: 2x/week  PT DURATION: 12 weeks  PLANNED INTERVENTIONS: Therapeutic exercises, Therapeutic activity, Neuromuscular re-education, Balance training, Gait training, Patient/Family education, Joint manipulation, Joint mobilization, Canalith repositioning, Aquatic Therapy, Dry Needling, Cognitive remediation, Electrical stimulation, Spinal manipulation, Spinal mobilization, Cryotherapy, Moist heat, Traction, Ultrasound, Ionotophoresis '4mg'$ /ml Dexamethasone, and Manual therapy  PLAN FOR NEXT SESSION: Progress strengthening, incorporate balance exercises as necessary;   JLyndel SafeHuprich PT, DPT, GCS  Jaime Johnson 12/26/2022, 8:33 PM

## 2022-12-25 NOTE — Progress Notes (Signed)
Acupuncture approval faxed to [facility] per Julieta Bellini.

## 2022-12-27 ENCOUNTER — Ambulatory Visit: Payer: Self-pay

## 2022-12-27 DIAGNOSIS — R2681 Unsteadiness on feet: Secondary | ICD-10-CM

## 2022-12-27 DIAGNOSIS — M6281 Muscle weakness (generalized): Secondary | ICD-10-CM

## 2022-12-27 NOTE — Therapy (Signed)
OUTPATIENT PHYSICAL THERAPY BALANCE/DECONDITINIONG TREATMENT  Patient Name: Jaime Johnson MRN: [id number] DOB:Mar 09, 1961, 62 y.o., female Today's Date: 12/27/2022  END OF SESSION:  PT End of Session - 12/27/22 1449     Visit Number 6    Number of Visits 25    Date for PT Re-Evaluation 03/05/23    Authorization Type eval: 12/11/22;    PT Start Time 1450    PT Stop Time 1530    PT Time Calculation (min) 40 min    Activity Tolerance Patient tolerated treatment well    Behavior During Therapy [facility] for tasks assessed/performed            Past Medical History:  Diagnosis Date   Allergic rhinitis due to pollen    Endometrial cancer ([place])    Hyperlipidemia LDL goal <100 02/16/2020   Hypertension 12/19/2016   Past Surgical History:  Procedure Laterality Date   CESAREAN SECTION     COLONOSCOPY WITH PROPOFOL N/A 11/12/2016   Procedure: COLONOSCOPY WITH PROPOFOL;  Surgeon: Lucilla Lame, MD;  Location: [place];  Service: Endoscopy;  Laterality: N/A;   COLONOSCOPY WITH PROPOFOL N/A 03/29/2017   Procedure: COLONOSCOPY WITH PROPOFOL;  Surgeon: Lucilla Lame, MD;  Location: [place];  Service: Endoscopy;  Laterality: N/A;   COLONOSCOPY WITH PROPOFOL N/A 03/14/2020   Procedure: COLONOSCOPY WITH PROPOFOL;  Surgeon: Lucilla Lame, MD;  Location: [place];  Service: Endoscopy;  Laterality: N/A;  priority 4   IR IMAGING GUIDED PORT INSERTION  06/01/2022   POLYPECTOMY  11/12/2016   Procedure: POLYPECTOMY;  Surgeon: Lucilla Lame, MD;  Location: [place];  Service: Endoscopy;;   POLYPECTOMY  03/29/2017   Procedure: POLYPECTOMY;  Surgeon: Lucilla Lame, MD;  Location: [place];  Service: Endoscopy;;   Patient Active Problem List   Diagnosis Date Noted   Colon cancer ([place]) 05/22/2022   Endometrial carcinoma ([place]) 03/09/2022   Foraminal stenosis of cervical region (RIGHT) 03/16/2020   Hyperlipidemia LDL goal <100 02/16/2020   Cervical radicular pain  (RIGHT) 09/29/2019   Bilateral carpal tunnel syndrome 09/29/2019   Essential hypertension 12/19/2016   Allergic rhinitis due to pollen    PCP: Owens Loffler MD  REFERRING PROVIDER: Beckey Rutter NP  REFERRING DIAG: Endometrial carcinoma  RATIONALE FOR EVALUATION AND TREATMENT: Rehabilitation  THERAPY DIAG: Muscle weakness (generalized)  Unsteadiness on feet  ONSET DATE:   FOLLOW-UP APPT SCHEDULED WITH REFERRING PROVIDER: Yes, radiology this Thursday.   From initial evaluation note: SUBJECTIVE:  SUBJECTIVE STATEMENT:  General deconditioning  PERTINENT HISTORY:  Patient is 62 year old female diagnosed with endometrial cancer, s/p 6 cycles of carbo-taxol completed 10/08/22, and vaginal brachytherapy completed 10/29/22. She has been complaining of leg swelling, numbness, and pain of the toes/feet. Previously took gabapentin and lyrica for her symptoms but was unable to tolerate d/t side effects. Compression leggings have helped somewhat. She also complains today of hip and BLE weakness. Pt reports BLE shaking and low back/sacral pain as well as general joint stiffness. She reports that when she was diagnosed her cancer was initially classified as stage 1 but after lymph node testing they found evidence in the lymph channels and reclassified her to stage 3. Her next scan is during the third week of March.  Pain: No, no pain currently but occasional low back and bilateral hip pain; Numbness/Tingling: Yes, numbness, tingling, stabbling, and burning in stocking distribution BLE; Focal Weakness: Yes, BLE LE weakness Prior history of physical therapy for balance:  No Dominant hand: right Red flags: Positive for history of endometrial cancer. Negative for bowel/bladder changes, saddle paresthesia, h/o spinal  tumors, h/o compression fx, h/o abdominal aneurysm, abdominal pain, chills/fever, night sweats, nausea, and vomiting.  PRECAUTIONS: None  WEIGHT BEARING RESTRICTIONS: No  FALLS: Has patient fallen in last 6 months? No,   Living Environment Lives with: lives with their spouse Lives in: House/apartment Stairs:  1 step to enter , 2 level house (attic upstairs, 1 flight of stairs with handrail); Has following equipment at home: Single point cane, Walker - 2 wheeled, and Shower bench  Prior level of function: Independent, husband assists with IADLs if needed;  Occupational demands: Stopped working at [employer] prior to 2020;  Hobbies: walking, working in her garden;   Patient Goals: "I would like to be able to stretch and walk without pain"   OBJECTIVE:   Patient Surveys  FOTO: 50, predicted improvement to 53 ABC: To be completed  Cognition Patient is oriented to person, place, and time.  Recent memory is intact.  Remote memory is intact.  Attention span and concentration are intact.  Expressive speech is intact.  Patient's fund of knowledge is within normal limits for educational level.    Gross Musculoskeletal Assessment Tremor: None Bulk: Normal Tone: Normal No pitting edema noted in BLE  GAIT: Deferred full gait assessment but decrease self-selected speed;  Posture: No gross abnormalities noted in standing or seated posture  AROM Deferred  LE MMT: MMT (out of 5) Right  Left   Hip flexion 4 4  Hip extension    Hip abduction (seated) 4 4  Hip adduction (seated) 4 4  Hip internal rotation 4 4  Hip external rotation 4 4  Knee flexion 4 4  Knee extension 5 5  Ankle dorsiflexion 4 4  Ankle plantarflexion    Ankle inversion    Ankle eversion    (* = pain; Blank rows = not tested)  Sensation Deferred  Reflexes Deferred  Cranial Nerves Deferred  Coordination/Cerebellar Deferred  FUNCTIONAL OUTCOME MEASURES  Results Comments   BERG    DGI    FGA    TUG    5TSTS 33.0 seconds   2 Minute Walk Test    10 Meter Gait Speed    (Blank rows = not tested)   TODAY'S TREATMENT    SUBJECTIVE: Pt reports that today was her first accupuncture session.  She states it went well.  She is a little tired upon arrival as she  has been out of the house before coming to PT.  She walked 1 mile for the first time today at home on her treadmill.  She walked slowly, it took her about 22 minutes and she did not take a break.  She states she did the walk while also wearing 1# ankle weights.   PAIN: Denies resting pain;    Ther-ex  NuStep L2-4 x 10 minutes at start of session for LE strengthening and cardiovascular conditioning with therapist adjusting resistance, incorporated a few short bouts of higher intensity; Supine hooklying LTR 3x 30 sec each side Supine DKTC x 1 min, sacral rocking x 30 seconds Supine figure 4 x 1 min ea LE Standing marches 2x10 Practiced deep squat (to reach items on floor), uses UE support in parallel bars x 3  Neuromuscular Re-education  Tandem balance alternating forward LE x 30s each; Tandem gait in // bars x multiple lengths; Airex feet together eyes open/closed x 30s each; Airex feet together horizontal and vertical head turns x 30s each;  Not today: Standing hip flexion with 3# ankle weights (AW) x 15 BLE; Standing heel raises with BUE support x 15; Seated LAQ with 3# AW x 15 BLE; Seated clams with manual resistance from therapist 2 x 10; Seated adductor squeeze with manual resistance from therapist 2 x 10; Sit to stand from regular height chair without UE support x 10; PATIENT EDUCATION:  Education details: Pt educated throughout session about proper posture and technique with exercises. Improved exercise technique, movement at target joints, use of target muscles after min to mod verbal, visual, tactile cues. Person educated: Patient Education method: Explanation Education comprehension:  verbalized understanding   HOME EXERCISE PROGRAM: Access Code: [email or web address] URL: [email or web address] Date: 12/25/2022 Prepared by: Roxana Hires  Exercises - Sit to Stand Without Arm Support  - 1 x daily - 7 x weekly - 3 sets - 5 reps - Seated Hip Abduction with Resistance  - 1 x daily - 7 x weekly - 3 sets - 5 reps - Seated Isometric Hip Adduction with Ball  - 1 x daily - 7 x weekly - 3 sets - 5 reps - Heel Raises with Counter Support  - 1 x daily - 7 x weekly - 2 sets - 10 reps - Feet Together Balance at [employer] Eyes Closed  - 1 x daily - 7 x weekly - 3 reps - 30s hold - Romberg Stance with Head Rotation  - 1 x daily - 7 x weekly - 3 reps - 30s hold   ASSESSMENT:  CLINICAL IMPRESSION: Pt reached a point of fatigue during today's session and also noted her lower back tightness (she reports this is not a new problem, she experiences tightness) limited her participation in standing exercises.  Shifted gears during tx today with more gentle lumbopelvic/trunk mobility focused exercises at end of session to address.  Discussed not using ankle weights during treadmill walks at home as not adding a beneficial component to this activity.  She will benefit from PT services to address deficits in strength, balance, and endurance in order to return to full function at home.   OBJECTIVE IMPAIRMENTS: decreased balance, decreased strength, and pain.   ACTIVITY LIMITATIONS: lifting, bending, sitting, standing, and squatting  PARTICIPATION LIMITATIONS: meal prep, cleaning, laundry, shopping, community activity, and yard work  PERSONAL FACTORS: Age, Past/current experiences, Time since onset of injury/illness/exacerbation, and 1-2 comorbidities: endometrial cancer and hypertension  are also affecting patient's functional outcome.   REHAB POTENTIAL: Good  CLINICAL DECISION MAKING: Evolving/moderate complexity  EVALUATION COMPLEXITY: Moderate   GOALS: Goals reviewed with patient?  No  SHORT TERM GOALS: Target date: 01/22/2023   Pt will be independent with HEP in order to improve strength and balance in order to decrease fall risk and improve function at home. Baseline:  Goal status: INITIAL   LONG TERM GOALS: Target date: 03/05/23  Pt will increase FOTO to at least 63 to demonstrate significant improvement in function at home related to balance and leg strength. Baseline: 12/11/22: 50 Goal status: INITIAL  2.  Pt will improve BERG by at least 3 points in order to demonstrate clinically significant improvement in balance.   Baseline: 12/11/22: To be completed; 12/18/22: 53/56; Goal status: INITIAL  3. Pt will decrease TUG to below 14 seconds in order to demonstrate decreased fall risk.    Baseline: 12/11/22: 33.0s; Goal status: INITIAL  4. Pt will improve 30s Sit to Stand Test by at least 2 repetitions in order to demonstrate improved BLE endurance and improve her ability to walk and work in the yard.     Baseline: 12/11/22: To be completed; 12/13/22: 4 reps; Goal status: INITIAL  5. Pt will increase 2MWT by at least 62f in order to demonstrate clinically significant improvement in cardiopulmonary endurance and community ambulation   Baseline: 12/18/22: 249' with hand held assist (5/10 RPE, 5/10 BORG Dyspnea); Goal status: INITIAL   PLAN: PT FREQUENCY: 2x/week  PT DURATION: 12 weeks  PLANNED INTERVENTIONS: Therapeutic exercises, Therapeutic activity, Neuromuscular re-education, Balance training, Gait training, Patient/Family education, Joint manipulation, Joint mobilization, Canalith repositioning, Aquatic Therapy, Dry Needling, Cognitive remediation, Electrical stimulation, Spinal manipulation, Spinal mobilization, Cryotherapy, Moist heat, Traction, Ultrasound, Ionotophoresis '4mg'$ /ml Dexamethasone, and Manual therapy  PLAN FOR NEXT SESSION: Progress strengthening, incorporate balance exercises as necessary;  RMerdis Delay PT, DPT, OCS  #[phone] RPincus Badder3/04/2023, 6:16 PM

## 2023-01-01 ENCOUNTER — Ambulatory Visit: Payer: Self-pay

## 2023-01-01 DIAGNOSIS — R2681 Unsteadiness on feet: Secondary | ICD-10-CM

## 2023-01-01 DIAGNOSIS — M6281 Muscle weakness (generalized): Secondary | ICD-10-CM

## 2023-01-03 ENCOUNTER — Ambulatory Visit: Payer: Self-pay

## 2023-01-03 DIAGNOSIS — R2681 Unsteadiness on feet: Secondary | ICD-10-CM

## 2023-01-03 DIAGNOSIS — M6281 Muscle weakness (generalized): Secondary | ICD-10-CM

## 2023-01-03 NOTE — Therapy (Signed)
OUTPATIENT PHYSICAL THERAPY BALANCE/DECONDITINIONG TREATMENT  Patient Name: Jaime Johnson MRN: [id number] DOB:January 21, 1961, 62 y.o., female Today's Date: 01/06/2023  END OF SESSION:  PT End of Session - 01/06/23 2102     Visit Number 7    Number of Visits 25    Date for PT Re-Evaluation 03/05/23    Authorization Type eval: 12/11/22;    PT Start Time 1445    PT Stop Time 1530    PT Time Calculation (min) 45 min    Activity Tolerance Patient tolerated treatment well    Behavior During Therapy [facility] for tasks assessed/performed            Past Medical History:  Diagnosis Date   Allergic rhinitis due to pollen    Endometrial cancer ([place])    Hyperlipidemia LDL goal <100 02/16/2020   Hypertension 12/19/2016   Past Surgical History:  Procedure Laterality Date   CESAREAN SECTION     COLONOSCOPY WITH PROPOFOL N/A 11/12/2016   Procedure: COLONOSCOPY WITH PROPOFOL;  Surgeon: Lucilla Lame, MD;  Location: [place];  Service: Endoscopy;  Laterality: N/A;   COLONOSCOPY WITH PROPOFOL N/A 03/29/2017   Procedure: COLONOSCOPY WITH PROPOFOL;  Surgeon: Lucilla Lame, MD;  Location: [place];  Service: Endoscopy;  Laterality: N/A;   COLONOSCOPY WITH PROPOFOL N/A 03/14/2020   Procedure: COLONOSCOPY WITH PROPOFOL;  Surgeon: Lucilla Lame, MD;  Location: [place];  Service: Endoscopy;  Laterality: N/A;  priority 4   IR IMAGING GUIDED PORT INSERTION  06/01/2022   POLYPECTOMY  11/12/2016   Procedure: POLYPECTOMY;  Surgeon: Lucilla Lame, MD;  Location: [place];  Service: Endoscopy;;   POLYPECTOMY  03/29/2017   Procedure: POLYPECTOMY;  Surgeon: Lucilla Lame, MD;  Location: [place];  Service: Endoscopy;;   Patient Active Problem List   Diagnosis Date Noted   Colon cancer ([place]) 05/22/2022   Endometrial carcinoma ([place]) 03/09/2022   Foraminal stenosis of cervical region (RIGHT) 03/16/2020   Hyperlipidemia LDL goal <100 02/16/2020   Cervical radicular pain  (RIGHT) 09/29/2019   Bilateral carpal tunnel syndrome 09/29/2019   Essential hypertension 12/19/2016   Allergic rhinitis due to pollen    PCP: Owens Loffler MD  REFERRING PROVIDER: Beckey Rutter NP  REFERRING DIAG: Endometrial carcinoma  RATIONALE FOR EVALUATION AND TREATMENT: Rehabilitation  THERAPY DIAG: Muscle weakness (generalized)  Unsteadiness on feet  ONSET DATE:   FOLLOW-UP APPT SCHEDULED WITH REFERRING PROVIDER: Yes, radiology this Thursday.   From initial evaluation note: SUBJECTIVE:  SUBJECTIVE STATEMENT:  General deconditioning  PERTINENT HISTORY:  Patient is 62 year old female diagnosed with endometrial cancer, s/p 6 cycles of carbo-taxol completed 10/08/22, and vaginal brachytherapy completed 10/29/22. She has been complaining of leg swelling, numbness, and pain of the toes/feet. Previously took gabapentin and lyrica for her symptoms but was unable to tolerate d/t side effects. Compression leggings have helped somewhat. She also complains today of hip and BLE weakness. Pt reports BLE shaking and low back/sacral pain as well as general joint stiffness. She reports that when she was diagnosed her cancer was initially classified as stage 1 but after lymph node testing they found evidence in the lymph channels and reclassified her to stage 3. Her next scan is during the third week of March.  Pain: No, no pain currently but occasional low back and bilateral hip pain; Numbness/Tingling: Yes, numbness, tingling, stabbling, and burning in stocking distribution BLE; Focal Weakness: Yes, BLE LE weakness Prior history of physical therapy for balance:  No Dominant hand: right Red flags: Positive for history of endometrial cancer. Negative for bowel/bladder changes, saddle paresthesia, h/o spinal  tumors, h/o compression fx, h/o abdominal aneurysm, abdominal pain, chills/fever, night sweats, nausea, and vomiting.  PRECAUTIONS: None  WEIGHT BEARING RESTRICTIONS: No  FALLS: Has patient fallen in last 6 months? No,   Living Environment Lives with: lives with their spouse Lives in: House/apartment Stairs:  1 step to enter , 2 level house (attic upstairs, 1 flight of stairs with handrail); Has following equipment at home: Single point cane, Walker - 2 wheeled, and Shower bench  Prior level of function: Independent, husband assists with IADLs if needed;  Occupational demands: Stopped working at [employer] prior to 2020;  Hobbies: walking, working in her garden;   Patient Goals: "I would like to be able to stretch and walk without pain"   OBJECTIVE:   Patient Surveys  FOTO: 50, predicted improvement to 87 ABC: To be completed  Cognition Patient is oriented to person, place, and time.  Recent memory is intact.  Remote memory is intact.  Attention span and concentration are intact.  Expressive speech is intact.  Patient's fund of knowledge is within normal limits for educational level.    Gross Musculoskeletal Assessment Tremor: None Bulk: Normal Tone: Normal No pitting edema noted in BLE  GAIT: Deferred full gait assessment but decrease self-selected speed;  Posture: No gross abnormalities noted in standing or seated posture  AROM Deferred  LE MMT: MMT (out of 5) Right  Left   Hip flexion 4 4  Hip extension    Hip abduction (seated) 4 4  Hip adduction (seated) 4 4  Hip internal rotation 4 4  Hip external rotation 4 4  Knee flexion 4 4  Knee extension 5 5  Ankle dorsiflexion 4 4  Ankle plantarflexion    Ankle inversion    Ankle eversion    (* = pain; Blank rows = not tested)  Sensation Deferred  Reflexes Deferred  Cranial Nerves Deferred  Coordination/Cerebellar Deferred  FUNCTIONAL OUTCOME MEASURES  Results Comments   BERG    DGI    FGA    TUG    5TSTS 33.0 seconds   2 Minute Walk Test    10 Meter Gait Speed    (Blank rows = not tested)   TODAY'S TREATMENT    SUBJECTIVE: Pt reports that she is doing well today. She avoided excessive exercise earlier today to avoid fatigue prior to therapy session. She reports some low  back pain upon arrival. No specific questions currently.   PAIN: Low back pain, not rated;   Ther-ex  NuStep L2-4 x 3 minutes followed by intervals L1/L8 30s on/45s off x 7 minutes at start of session for LE strengthening and cardiovascular conditioning with therapist adjusting resistance; Stair practice 4 steps x 4 bouts; Lateral 6" step-ups x 10 toward each side; Walking lunges in // bars x multiple lengths; Calf stretch with Prostretch 2 x 30s; Sit to stand with 6# med ball 2 x 5;   Neuromuscular Re-education  Tandem gait in // bars x multiple lengths; Airex balance beam tandem gait in // bars x multiple lengths; Airex balance beam side stepping x multiple lengths; Rockerboard static balance without UE support; Rockerboard balance with horizontal and vertical head turns x 30s each;   Not performed: Standing hip flexion with 3# ankle weights (AW) x 15 BLE; Standing heel raises with BUE support x 15; Seated LAQ with 3# AW x 15 BLE; Seated clams with manual resistance from therapist 2 x 10; Seated adductor squeeze with manual resistance from therapist 2 x 10; Sit to stand from regular height chair without UE support x 10; Supine hooklying LTR 3x 30 sec each side Supine DKTC x 1 min, sacral rocking x 30 seconds Supine figure 4 x 1 min ea LE Standing marches 2x10 Practiced deep squat (to reach items on floor), uses UE support in parallel bars x 3 Airex feet together eyes open/closed x 30s each;   PATIENT EDUCATION:  Education details: Pt educated throughout session about proper posture and technique with exercises. Improved exercise technique, movement at target  joints, use of target muscles after min to mod verbal, visual, tactile cues. Person educated: Patient Education method: Explanation Education comprehension: verbalized understanding   HOME EXERCISE PROGRAM: Access Code: [email or web address] URL: [email or web address] Date: 12/25/2022 Prepared by: Roxana Hires  Exercises - Sit to Stand Without Arm Support  - 1 x daily - 7 x weekly - 3 sets - 5 reps - Seated Hip Abduction with Resistance  - 1 x daily - 7 x weekly - 3 sets - 5 reps - Seated Isometric Hip Adduction with Ball  - 1 x daily - 7 x weekly - 3 sets - 5 reps - Heel Raises with Counter Support  - 1 x daily - 7 x weekly - 2 sets - 10 reps - Feet Together Balance at [employer] Eyes Closed  - 1 x daily - 7 x weekly - 3 reps - 30s hold - Romberg Stance with Head Rotation  - 1 x daily - 7 x weekly - 3 reps - 30s hold   ASSESSMENT:  CLINICAL IMPRESSION: Pt demonstrates excellent motivation during session today. Intermittent rest breaks provided however pt is less fatigued today compared to last session. Plan to progress strengthening as she is able to tolerate. She will benefit from PT services to address deficits in strength, balance, and endurance in order to return to full function at home.   OBJECTIVE IMPAIRMENTS: decreased balance, decreased strength, and pain.   ACTIVITY LIMITATIONS: lifting, bending, sitting, standing, and squatting  PARTICIPATION LIMITATIONS: meal prep, cleaning, laundry, shopping, community activity, and yard work  PERSONAL FACTORS: Age, Past/current experiences, Time since onset of injury/illness/exacerbation, and 1-2 comorbidities: endometrial cancer and hypertension  are also affecting patient's functional outcome.   REHAB POTENTIAL: Good  CLINICAL DECISION MAKING: Evolving/moderate complexity  EVALUATION COMPLEXITY: Moderate   GOALS: Goals reviewed with patient? No  SHORT TERM GOALS: Target date: 01/22/2023  Pt will be independent with HEP  in order to improve strength and balance in order to decrease fall risk and improve function at home. Baseline:  Goal status: INITIAL   LONG TERM GOALS: Target date: 03/05/23  Pt will increase FOTO to at least 63 to demonstrate significant improvement in function at home related to balance and leg strength. Baseline: 12/11/22: 50 Goal status: INITIAL  2.  Pt will improve BERG by at least 3 points in order to demonstrate clinically significant improvement in balance.   Baseline: 12/11/22: To be completed; 12/18/22: 53/56; Goal status: INITIAL  3. Pt will decrease TUG to below 14 seconds in order to demonstrate decreased fall risk.    Baseline: 12/11/22: 33.0s; Goal status: INITIAL  4. Pt will improve 30s Sit to Stand Test by at least 2 repetitions in order to demonstrate improved BLE endurance and improve her ability to walk and work in the yard.     Baseline: 12/11/22: To be completed; 12/13/22: 4 reps; Goal status: INITIAL  5. Pt will increase 2MWT by at least 62ft in order to demonstrate clinically significant improvement in cardiopulmonary endurance and community ambulation   Baseline: 12/18/22: 249' with hand held assist (5/10 RPE, 5/10 BORG Dyspnea); Goal status: INITIAL   PLAN: PT FREQUENCY: 2x/week  PT DURATION: 12 weeks  PLANNED INTERVENTIONS: Therapeutic exercises, Therapeutic activity, Neuromuscular re-education, Balance training, Gait training, Patient/Family education, Joint manipulation, Joint mobilization, Canalith repositioning, Aquatic Therapy, Dry Needling, Cognitive remediation, Electrical stimulation, Spinal manipulation, Spinal mobilization, Cryotherapy, Moist heat, Traction, Ultrasound, Ionotophoresis 4mg /ml Dexamethasone, and Manual therapy  PLAN FOR NEXT SESSION: Progress strengthening, incorporate balance exercises as necessary;  Lyndel Safe Hendy Brindle PT, DPT, GCS  Jaime Johnson 01/06/2023, 9:19 PM

## 2023-01-04 ENCOUNTER — Ambulatory Visit
Admission: RE | Admit: 2023-01-04 | Discharge: 2023-01-04 | Disposition: A | Discharge status: Home / Self Care | Payer: Self-pay | Source: Ambulatory Visit | Attending: Oncology | Admitting: Oncology

## 2023-01-04 DIAGNOSIS — C541 Malignant neoplasm of endometrium: Secondary | ICD-10-CM | POA: Insufficient documentation

## 2023-01-04 MED ORDER — IOHEXOL 300 MG/ML  SOLN
100.0000 mL | Freq: Once | INTRAMUSCULAR | Status: AC | PRN
  Administered 2023-01-04: 100 mL via INTRAVENOUS

## 2023-01-07 NOTE — Therapy (Signed)
OUTPATIENT PHYSICAL THERAPY BALANCE/DECONDITINIONG TREATMENT  Patient Name: Jaime Johnson MRN: [id number] DOB:1961-05-15, 62 y.o., female Today's Date: 01/08/2023  END OF SESSION:  PT End of Session - 01/08/23 1444     Visit Number 8    Number of Visits 25    Date for PT Re-Evaluation 03/05/23    Authorization Type eval: 12/11/22;    PT Start Time 1445    PT Stop Time 1535    PT Time Calculation (min) 50 min    Activity Tolerance Patient tolerated treatment well    Behavior During Therapy [facility] for tasks assessed/performed            Past Medical History:  Diagnosis Date   Allergic rhinitis due to pollen    Endometrial cancer ([place])    Hyperlipidemia LDL goal <100 02/16/2020   Hypertension 12/19/2016   Past Surgical History:  Procedure Laterality Date   CESAREAN SECTION     COLONOSCOPY WITH PROPOFOL N/A 11/12/2016   Procedure: COLONOSCOPY WITH PROPOFOL;  Surgeon: Lucilla Lame, MD;  Location: [place];  Service: Endoscopy;  Laterality: N/A;   COLONOSCOPY WITH PROPOFOL N/A 03/29/2017   Procedure: COLONOSCOPY WITH PROPOFOL;  Surgeon: Lucilla Lame, MD;  Location: [place];  Service: Endoscopy;  Laterality: N/A;   COLONOSCOPY WITH PROPOFOL N/A 03/14/2020   Procedure: COLONOSCOPY WITH PROPOFOL;  Surgeon: Lucilla Lame, MD;  Location: [place];  Service: Endoscopy;  Laterality: N/A;  priority 4   IR IMAGING GUIDED PORT INSERTION  06/01/2022   POLYPECTOMY  11/12/2016   Procedure: POLYPECTOMY;  Surgeon: Lucilla Lame, MD;  Location: [place];  Service: Endoscopy;;   POLYPECTOMY  03/29/2017   Procedure: POLYPECTOMY;  Surgeon: Lucilla Lame, MD;  Location: [place];  Service: Endoscopy;;   Patient Active Problem List   Diagnosis Date Noted   Colon cancer ([place]) 05/22/2022   Endometrial carcinoma ([place]) 03/09/2022   Foraminal stenosis of cervical region (RIGHT) 03/16/2020   Hyperlipidemia LDL goal <100 02/16/2020   Cervical radicular pain  (RIGHT) 09/29/2019   Bilateral carpal tunnel syndrome 09/29/2019   Essential hypertension 12/19/2016   Allergic rhinitis due to pollen    PCP: Owens Loffler MD  REFERRING PROVIDER: Beckey Rutter NP  REFERRING DIAG: Endometrial carcinoma  RATIONALE FOR EVALUATION AND TREATMENT: Rehabilitation  THERAPY DIAG: Muscle weakness (generalized)  Unsteadiness on feet  ONSET DATE:   FOLLOW-UP APPT SCHEDULED WITH REFERRING PROVIDER: Yes, radiology this Thursday.   From initial evaluation note: SUBJECTIVE:  SUBJECTIVE STATEMENT:  General deconditioning  PERTINENT HISTORY:  Patient is 62 year old female diagnosed with endometrial cancer, s/p 6 cycles of carbo-taxol completed 10/08/22, and vaginal brachytherapy completed 10/29/22. She has been complaining of leg swelling, numbness, and pain of the toes/feet. Previously took gabapentin and lyrica for her symptoms but was unable to tolerate d/t side effects. Compression leggings have helped somewhat. She also complains today of hip and BLE weakness. Pt reports BLE shaking and low back/sacral pain as well as general joint stiffness. She reports that when she was diagnosed her cancer was initially classified as stage 1 but after lymph node testing they found evidence in the lymph channels and reclassified her to stage 3. Her next scan is during the third week of March.  Pain: No, no pain currently but occasional low back and bilateral hip pain; Numbness/Tingling: Yes, numbness, tingling, stabbling, and burning in stocking distribution BLE; Focal Weakness: Yes, BLE LE weakness Prior history of physical therapy for balance:  No Dominant hand: right Red flags: Positive for history of endometrial cancer. Negative for bowel/bladder changes, saddle paresthesia, h/o spinal  tumors, h/o compression fx, h/o abdominal aneurysm, abdominal pain, chills/fever, night sweats, nausea, and vomiting.  PRECAUTIONS: None  WEIGHT BEARING RESTRICTIONS: No  FALLS: Has patient fallen in last 6 months? No,   Living Environment Lives with: lives with their spouse Lives in: House/apartment Stairs:  1 step to enter , 2 level house (attic upstairs, 1 flight of stairs with handrail); Has following equipment at home: Single point cane, Walker - 2 wheeled, and Shower bench  Prior level of function: Independent, husband assists with IADLs if needed;  Occupational demands: Stopped working at [employer] prior to 2020;  Hobbies: walking, working in her garden;   Patient Goals: "I would like to be able to stretch and walk without pain"   OBJECTIVE:   Patient Surveys  FOTO: 50, predicted improvement to 69 ABC: To be completed  Cognition Patient is oriented to person, place, and time.  Recent memory is intact.  Remote memory is intact.  Attention span and concentration are intact.  Expressive speech is intact.  Patient's fund of knowledge is within normal limits for educational level.    Gross Musculoskeletal Assessment Tremor: None Bulk: Normal Tone: Normal No pitting edema noted in BLE  GAIT: Deferred full gait assessment but decrease self-selected speed;  Posture: No gross abnormalities noted in standing or seated posture  AROM Deferred  LE MMT: MMT (out of 5) Right  Left   Hip flexion 4 4  Hip extension    Hip abduction (seated) 4 4  Hip adduction (seated) 4 4  Hip internal rotation 4 4  Hip external rotation 4 4  Knee flexion 4 4  Knee extension 5 5  Ankle dorsiflexion 4 4  Ankle plantarflexion    Ankle inversion    Ankle eversion    (* = pain; Blank rows = not tested)  Sensation Deferred  Reflexes Deferred  Cranial Nerves Deferred  Coordination/Cerebellar Deferred  FUNCTIONAL OUTCOME MEASURES  Results Comments   BERG    DGI    FGA    TUG    5TSTS 33.0 seconds   2 Minute Walk Test    10 Meter Gait Speed    (Blank rows = not tested)   TODAY'S TREATMENT    SUBJECTIVE: Pt reports that she is doing well today. She is noticing improvement in her leg strength and her function. Pt states that she feels like  she turned a corner. Denies low back pain upon arrival. No specific questions currently.   PAIN: Low back pain, not rated;   Ther-ex  NuStep L1-2 x 1.5 minutes followed by intervals L1/L8 30s on/45s off x 8 minutes at start of session for LE strengthening and cardiovascular conditioning with therapist adjusting resistance; Forward 12" step-ups x 5 with each side; Total Gym (TG) Level 22 (L22) double leg squats 2 x 10; TG L22 double leg heel raises 2 x 10; Nautilus resisted gait forward/backward 80# x 3 each; Nautilus resisted gait R lateral and L lateral 50# x 2 each; Calf stretch on rockerboard 2 x 30s;   Neuromuscular Re-education  Tandem gait in // bars x multiple lengths; Rockerboard static balance without UE support; Rockerboard balance with horizontal and vertical head turns x 30s each;   Not performed: Standing hip flexion with 3# ankle weights (AW) x 15 BLE; Standing heel raises with BUE support x 15; Seated LAQ with 3# AW x 15 BLE; Seated clams with manual resistance from therapist 2 x 10; Seated adductor squeeze with manual resistance from therapist 2 x 10; Sit to stand from regular height chair without UE support x 10; Airex feet together eyes open/closed x 30s each; Walking lunges in // bars x multiple lengths; Airex balance beam tandem gait in // bars x multiple lengths; Airex balance beam side stepping x multiple lengths;   PATIENT EDUCATION:  Education details: Pt educated throughout session about proper posture and technique with exercises. Improved exercise technique, movement at target joints, use of target muscles after min to mod verbal, visual, tactile  cues. Person educated: Patient Education method: Explanation Education comprehension: verbalized understanding   HOME EXERCISE PROGRAM: Access Code: [email or web address] URL: [email or web address] Date: 12/25/2022 Prepared by: Roxana Hires  Exercises - Sit to Stand Without Arm Support  - 1 x daily - 7 x weekly - 3 sets - 5 reps - Seated Hip Abduction with Resistance  - 1 x daily - 7 x weekly - 3 sets - 5 reps - Seated Isometric Hip Adduction with Ball  - 1 x daily - 7 x weekly - 3 sets - 5 reps - Heel Raises with Counter Support  - 1 x daily - 7 x weekly - 2 sets - 10 reps - Feet Together Balance at [employer] Eyes Closed  - 1 x daily - 7 x weekly - 3 reps - 30s hold - Romberg Stance with Head Rotation  - 1 x daily - 7 x weekly - 3 reps - 30s hold   ASSESSMENT:  CLINICAL IMPRESSION: Pt demonstrates excellent motivation during session today. Intermittent rest breaks provided however with each passing session pt demonstrates less fatigue. She is also reporting progressively less back pain over the last few sessions. Progressed strengthening today introducing exercises on the Total Gym and Nautilus resisted gait. Plan to progress strengthening as she is able to tolerate. She will benefit from PT services to address deficits in strength, balance, and endurance in order to return to full function at home.   OBJECTIVE IMPAIRMENTS: decreased balance, decreased strength, and pain.   ACTIVITY LIMITATIONS: lifting, bending, sitting, standing, and squatting  PARTICIPATION LIMITATIONS: meal prep, cleaning, laundry, shopping, community activity, and yard work  PERSONAL FACTORS: Age, Past/current experiences, Time since onset of injury/illness/exacerbation, and 1-2 comorbidities: endometrial cancer and hypertension  are also affecting patient's functional outcome.   REHAB POTENTIAL: Good  CLINICAL DECISION MAKING: Evolving/moderate complexity  EVALUATION COMPLEXITY:  Moderate   GOALS: Goals  reviewed with patient? No  SHORT TERM GOALS: Target date: 01/22/2023   Pt will be independent with HEP in order to improve strength and balance in order to decrease fall risk and improve function at home. Baseline:  Goal status: INITIAL   LONG TERM GOALS: Target date: 03/05/23  Pt will increase FOTO to at least 63 to demonstrate significant improvement in function at home related to balance and leg strength. Baseline: 12/11/22: 50 Goal status: INITIAL  2.  Pt will improve BERG by at least 3 points in order to demonstrate clinically significant improvement in balance.   Baseline: 12/11/22: To be completed; 12/18/22: 53/56; Goal status: INITIAL  3. Pt will decrease TUG to below 14 seconds in order to demonstrate decreased fall risk.    Baseline: 12/11/22: 33.0s; Goal status: INITIAL  4. Pt will improve 30s Sit to Stand Test by at least 2 repetitions in order to demonstrate improved BLE endurance and improve her ability to walk and work in the yard.     Baseline: 12/11/22: To be completed; 12/13/22: 4 reps; Goal status: INITIAL  5. Pt will increase 2MWT by at least 19ft in order to demonstrate clinically significant improvement in cardiopulmonary endurance and community ambulation   Baseline: 12/18/22: 249' with hand held assist (5/10 RPE, 5/10 BORG Dyspnea); Goal status: INITIAL   PLAN: PT FREQUENCY: 2x/week  PT DURATION: 12 weeks  PLANNED INTERVENTIONS: Therapeutic exercises, Therapeutic activity, Neuromuscular re-education, Balance training, Gait training, Patient/Family education, Joint manipulation, Joint mobilization, Canalith repositioning, Aquatic Therapy, Dry Needling, Cognitive remediation, Electrical stimulation, Spinal manipulation, Spinal mobilization, Cryotherapy, Moist heat, Traction, Ultrasound, Ionotophoresis 4mg /ml Dexamethasone, and Manual therapy  PLAN FOR NEXT SESSION: Progress strengthening, incorporate balance exercises as  necessary;  Lyndel Safe Jiovanny Burdell PT, DPT, GCS  Jaime Johnson 01/08/2023, 8:09 PM

## 2023-01-08 ENCOUNTER — Inpatient Hospital Stay (HOSPITAL_BASED_OUTPATIENT_CLINIC_OR_DEPARTMENT_OTHER): Payer: Self-pay | Admitting: Oncology

## 2023-01-08 ENCOUNTER — Inpatient Hospital Stay: Payer: Self-pay | Attending: Oncology

## 2023-01-08 ENCOUNTER — Encounter: Payer: Self-pay | Admitting: Oncology

## 2023-01-08 ENCOUNTER — Other Ambulatory Visit: Payer: Self-pay | Admitting: *Deleted

## 2023-01-08 ENCOUNTER — Ambulatory Visit: Payer: Self-pay

## 2023-01-08 VITALS — BP 150/79 | HR 69 | Temp 95.9°F | Resp 18 | Ht 62.0 in | Wt 151.2 lb

## 2023-01-08 DIAGNOSIS — C541 Malignant neoplasm of endometrium: Secondary | ICD-10-CM

## 2023-01-08 DIAGNOSIS — Z95828 Presence of other vascular implants and grafts: Secondary | ICD-10-CM

## 2023-01-08 DIAGNOSIS — M6281 Muscle weakness (generalized): Secondary | ICD-10-CM

## 2023-01-08 DIAGNOSIS — G629 Polyneuropathy, unspecified: Secondary | ICD-10-CM | POA: Insufficient documentation

## 2023-01-08 DIAGNOSIS — Z79899 Other long term (current) drug therapy: Secondary | ICD-10-CM | POA: Insufficient documentation

## 2023-01-08 DIAGNOSIS — R2681 Unsteadiness on feet: Secondary | ICD-10-CM

## 2023-01-08 LAB — CBC WITH DIFFERENTIAL/PLATELET
Abs Immature Granulocytes: 0 10*3/uL (ref 0.00–0.07)
Basophils Absolute: 0 10*3/uL (ref 0.0–0.1)
Basophils Relative: 1 %
Eosinophils Absolute: 0 10*3/uL (ref 0.0–0.5)
Eosinophils Relative: 1 %
HCT: 40.5 % (ref 36.0–46.0)
Hemoglobin: 14 g/dL (ref 12.0–15.0)
Immature Granulocytes: 0 %
Lymphocytes Relative: 40 %
Lymphs Abs: 1.3 10*3/uL (ref 0.7–4.0)
MCH: 28.9 pg (ref 26.0–34.0)
MCHC: 34.6 g/dL (ref 30.0–36.0)
MCV: 83.7 fL (ref 80.0–100.0)
Monocytes Absolute: 0.3 10*3/uL (ref 0.1–1.0)
Monocytes Relative: 9 %
Neutro Abs: 1.7 10*3/uL (ref 1.7–7.7)
Neutrophils Relative %: 49 %
Platelets: 148 10*3/uL — ABNORMAL LOW (ref 150–400)
RBC: 4.84 MIL/uL (ref 3.87–5.11)
RDW: 12.2 % (ref 11.5–15.5)
WBC: 3.3 10*3/uL — ABNORMAL LOW (ref 4.0–10.5)
nRBC: 0 % (ref 0.0–0.2)

## 2023-01-08 LAB — COMPREHENSIVE METABOLIC PANEL
ALT: 34 U/L (ref 0–44)
AST: 28 U/L (ref 15–41)
Albumin: 4.6 g/dL (ref 3.5–5.0)
Alkaline Phosphatase: 60 U/L (ref 38–126)
Anion gap: 8 (ref 5–15)
BUN: 14 mg/dL (ref 8–23)
CO2: 27 mmol/L (ref 22–32)
Calcium: 9.3 mg/dL (ref 8.9–10.3)
Chloride: 104 mmol/L (ref 98–111)
Creatinine, Ser: 0.82 mg/dL (ref 0.44–1.00)
GFR, Estimated: >60 mL/min (ref >60)
Glucose, Bld: 113 mg/dL — ABNORMAL HIGH (ref 70–99)
Potassium: 3.4 mmol/L — ABNORMAL LOW (ref 3.5–5.1)
Sodium: 139 mmol/L (ref 135–145)
Total Bilirubin: 0.6 mg/dL (ref 0.3–1.2)
Total Protein: 7.4 g/dL (ref 6.5–8.1)

## 2023-01-08 NOTE — Progress Notes (Addendum)
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 Claiborne Billings from GYN oncology at [facility].  She underwent robotic surgery on 03/30/2022.  Final pathology showed:    Final Pathologic Diagnosis  A. SENTINEL LYMPH NODE, RIGHT EXTERNAL ILIAC ARTERY, RESECTION: Two lymph nodes, negative for metastatic carcinoma (0/2).    B. SENTINEL LYMPH NODE, LEFT EXTERNAL ILIAC ARTERY, RESECTION: One lymph node, negative for metastatic carcinoma (0/1).    C. UTERUS, FALLOPIAN TUBE AND OVARIES, HYSTERECTOMY WITH BILATERAL SALPINGO-OOPHORECTOMY: Endometrioid adenocarcinoma, FIGO grade 2. Tumor measures 5 cm in greatest dimension.  Tumor invades 14 of 17 mm thick myometrium (greater than 50%).  Margins of resection are uninvolved by tumor.  Tumor involves lower uterine segment.  Cervix is uninvolved by tumor.  Benign right fallopian tube and ovary.  Left ovary with tumor located within lymphovascular spaces, no tissue invasion identified.  Left fallopian tube with tumor within lymphovascular spaces, no tissue invasion  identified. Free floating tumor located inside left fallopian tube.  pT1b pN0    She was recommended adjuvant chemotherapy with Norma Fredrickson Taxol for 3 cycles by Dr. Claiborne Billings followed by vaginal brachytherapy.  Patient states that she does not wish to travel all the way to [facility] given her ongoing fatigue as well as discomfort from episiotomy and would prefer to get treatment locally.  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  She is not a diabetic.  She has baseline rheumatoid arthritis   Pathology was also second for a second opinion to [facility] as well.  Path report over there suggested that this was a T3a tumor FIGO stage IIIa disease  Interval history-neuropathy is getting better with acupuncture and she has not required to start any medications yet.  Energy levels are slowly improving.  She does report some achiness especially in her bilateral lower extremities as well as tight feeling in her thighs and legs.  ECOG PS- 1 Pain scale- 0   Review of systems- Review of Systems  Constitutional:  Positive for malaise/fatigue. Negative for chills, fever and weight loss.  HENT:  Negative for congestion, ear discharge and nosebleeds.   Eyes:  Negative for blurred vision.  Respiratory:  Negative for cough, hemoptysis, sputum production, shortness of breath and wheezing.   Cardiovascular:  Negative for chest pain, palpitations, orthopnea and claudication.  Gastrointestinal:  Negative for abdominal pain, blood in stool, constipation, diarrhea, heartburn, melena, nausea and vomiting.  Genitourinary:  Negative for dysuria, flank pain, frequency, hematuria and urgency.  Musculoskeletal:  Negative for back pain, joint pain and myalgias.  Skin:  Negative for rash.  Neurological:  Positive for sensory change (Peripheral neuropathy). Negative for dizziness, tingling, focal weakness, seizures, weakness and headaches.  Endo/Heme/Allergies:  Does not bruise/bleed easily.  Psychiatric/Behavioral:   Negative for depression and suicidal ideas. The patient does not have insomnia.       Allergies  Allergen Reactions   Azithromycin Other (See Comments)    Gets a bad yeast infection from this.     Past Medical History:  Diagnosis Date   Allergic rhinitis due to pollen    Endometrial cancer ([place])    Hyperlipidemia LDL goal <100 02/16/2020   Hypertension 12/19/2016     Past Surgical History:  Procedure Laterality Date   CESAREAN SECTION     COLONOSCOPY WITH PROPOFOL N/A 11/12/2016   Procedure: COLONOSCOPY WITH PROPOFOL;  Surgeon: Lucilla Lame, MD;  Location: [place];  Service: Endoscopy;  Laterality: N/A;   COLONOSCOPY WITH PROPOFOL N/A 03/29/2017   Procedure: COLONOSCOPY WITH PROPOFOL;  Surgeon: Lucilla Lame, MD;  Location: [place];  Service: Endoscopy;  Laterality: N/A;   COLONOSCOPY WITH PROPOFOL N/A 03/14/2020   Procedure: COLONOSCOPY WITH PROPOFOL;  Surgeon: Lucilla Lame, MD;  Location: [place];  Service: Endoscopy;  Laterality: N/A;  priority 4   IR IMAGING GUIDED PORT INSERTION  06/01/2022   POLYPECTOMY  11/12/2016   Procedure: POLYPECTOMY;  Surgeon: Lucilla Lame, MD;  Location: [place];  Service: Endoscopy;;   POLYPECTOMY  03/29/2017   Procedure: POLYPECTOMY;  Surgeon: Lucilla Lame, MD;  Location: [place];  Service: Endoscopy;;    Social History   Socioeconomic History   Marital status: Married    Spouse name: Not on file   Number of children: Not on file   Years of education: Not on file   Highest education level: Not on file  Occupational History   Not on file  Tobacco Use   Smoking status: Former    Types: Cigarettes    Quit date: 10/22/1978    Years since quitting: 44.2   Smokeless tobacco: Never  Vaping Use   Vaping Use: Never used  Substance and Sexual Activity   Alcohol use: No   Drug use: No   Sexual activity: Yes    Partners: Male  Other Topics Concern   Not on file  Social History Narrative    Not on file   Social Determinants of Health   Financial Resource Strain: Low Risk  (06/22/2022)   Overall Financial Resource Strain (CARDIA)    Difficulty of Paying Living Expenses: Not very hard  Food Insecurity: No Food Insecurity (06/22/2022)   Hunger Vital Sign    Worried About Running Out of Food in the Last Year: Never true    Ran Out of Food in the Last Year: Never true  Transportation Needs: No Transportation Needs (06/22/2022)   PRAPARE - Hydrologist (Medical): No    Lack of Transportation (Non-Medical): No  Physical Activity: Inactive (06/22/2022)   Exercise Vital Sign    Days of Exercise per Week: 0 days    Minutes of Exercise per Session: 0 min  Stress: Stress Concern Present (06/22/2022)   [place]    Feeling of Stress : To some extent  Social Connections: Socially Integrated (06/22/2022)   Social Connection and Isolation Panel [NHANES]    Frequency of Communication with Friends and Family: Three times a week    Frequency of Social Gatherings with Friends and Family: Three times a week    Attends Religious Services: 1 to 4 times per year    Active Member of Clubs or  Organizations: No    Attends Archivist Meetings: 1 to 4 times per year    Marital Status: Married  Human resources officer Violence: Not At Risk (06/22/2022)   Humiliation, Afraid, Rape, and Kick questionnaire    Fear of Current or Ex-Partner: No    Emotionally Abused: No    Physically Abused: No    Sexually Abused: No    Family History  Problem Relation Age of Onset   Hepatitis C Mother 49       d/c at 54   Hypertension Father    Hepatitis C Brother    Breast cancer Neg Hx      Current Outpatient Medications:    diazepam (VALIUM) 5 MG tablet, TAKE 1 TABLET(5 MG) BY MOUTH EVERY 6 HOURS AS NEEDED FOR ANXIETY, Disp: 30 tablet, Rfl: 3   Lidocaine-Hydrocort, Perianal, 3-0.5 % CREA, Apply topically., Disp: ,  Rfl:    lidocaine-prilocaine (EMLA) cream, Apply to affected area once, Disp: 30 g, Rfl: 3   lisinopril (ZESTRIL) 40 MG tablet, Take 1 tablet (40 mg total) by mouth daily., Disp: 30 tablet, Rfl: 3   Multiple Vitamins-Minerals (MULTIVITAMIN GUMMIES ADULTS PO), Take by mouth daily., Disp: , Rfl:    traMADol (ULTRAM) 50 MG tablet, TAKE 1 TABLET(50 MG) BY MOUTH DAILY, Disp: 30 tablet, Rfl: 3   prochlorperazine (COMPAZINE) 10 MG tablet, Take 1 tablet (10 mg total) by mouth every 6 (six) hours as needed for nausea or vomiting. (Patient not taking: Reported on 01/08/2023), Disp: 60 tablet, Rfl: 2   rosuvastatin (CRESTOR) 10 MG tablet, Take 1 tablet (10 mg total) by mouth daily. (Patient not taking: Reported on 01/08/2023), Disp: 90 tablet, Rfl: 3   SYSTANE COMPLETE 0.6 % SOLN, Apply 1 drop to eye daily. (Patient not taking: Reported on 01/08/2023), Disp: , Rfl:   Physical exam:  Vitals:   01/08/23 0947 01/08/23 0959  BP: (!) 163/75 (!) 150/79  Pulse: 78 69  Resp: 18   Temp: (!) 95.9 F (35.5 C)   TempSrc: Tympanic   SpO2: 100%   Weight: 151 lb 3.2 oz (68.6 kg)   Height: 5\' 2"  (1.575 m)    Physical Exam Cardiovascular:     Rate and Rhythm: Normal rate and regular rhythm.     Heart sounds: Normal heart sounds.  Pulmonary:     Effort: Pulmonary effort is normal.     Breath sounds: Normal breath sounds.  Abdominal:     General: Bowel sounds are normal.     Palpations: Abdomen is soft.  Skin:    General: Skin is warm and dry.  Neurological:     Mental Status: She is alert and oriented to person, place, and time.         Latest Ref Rng & Units 01/08/2023    9:36 AM  CMP  Glucose 70 - 99 mg/dL 113   BUN 8 - 23 mg/dL 14   Creatinine 0.44 - 1.00 mg/dL 0.82   Sodium 135 - 145 mmol/L 139   Potassium 3.5 - 5.1 mmol/L 3.4   Chloride 98 - 111 mmol/L 104   CO2 22 - 32 mmol/L 27   Calcium 8.9 - 10.3 mg/dL 9.3   Total Protein 6.5 - 8.1 g/dL 7.4   Total Bilirubin 0.3 - 1.2 mg/dL 0.6    Alkaline Phos 38 - 126 U/L 60   AST 15 - 41 U/L 28   ALT 0 - 44 U/L 34       Latest Ref  Rng & Units 01/08/2023    9:36 AM  CBC  WBC 4.0 - 10.5 K/uL 3.3   Hemoglobin 12.0 - 15.0 g/dL 14.0   Hematocrit 36.0 - 46.0 % 40.5   Platelets 150 - 400 K/uL 148     No images are attached to the encounter.  CT CHEST ABDOMEN PELVIS W CONTRAST  Result Date: 01/07/2023 CLINICAL DATA:  Endometrial cancer with high-grade tumor assessment. Finished radiation and chemotherapy. Staging. * Tracking Code: BO *. EXAM: CT CHEST, ABDOMEN, AND PELVIS WITH CONTRAST TECHNIQUE: Multidetector CT imaging of the chest, abdomen and pelvis was performed following the standard protocol during bolus administration of intravenous contrast. RADIATION DOSE REDUCTION: This exam was performed according to the departmental dose-optimization program which includes automated exposure control, adjustment of the mA and/or kV according to patient size and/or use of iterative reconstruction technique. CONTRAST:  173mL OMNIPAQUE IOHEXOL 300 MG/ML  SOLN COMPARISON:  08/08/2022 CT FINDINGS: CT CHEST FINDINGS Cardiovascular: Right upper chest port. Heart is nonenlarged. No pericardial effusion. Normal caliber thoracic aorta with slight noncalcified mild plaque. Main pulmonary artery is slightly ectatic, unchanged from previous. Please correlate for any evidence of pulmonary artery hypertension. Mediastinum/Nodes: Slightly patulous esophagus. Heterogeneous thyroid gland with some small nodules again measuring up to 15 mm on the right with some calcification. No specific abnormal lymph node enlargement seen in the axillary region, hilum or mediastinum. Lungs/Pleura: There is some linear opacity lung bases likely scar or atelectasis. No consolidation, pneumothorax, effusion or dominant nodule. Slight breathing motion. Musculoskeletal: Slight curvature of the spine. Mild degenerative changes. Bones are mildly sclerotic but unchanged from previous. The  small focal area of sclerosis involving the T3 vertebral body is stable best seen on sagittal series 6, image 86. CT ABDOMEN PELVIS FINDINGS Hepatobiliary: Diffuse fatty liver infiltration. Focal progressive enhancing lesion again seen in segment 4A consistent with a hemangioma. No separate space-occupying liver lesion. Patent portal vein. Gallbladder is nondilated. Focal luminal stones. Pancreas: Unremarkable. No pancreatic ductal dilatation or surrounding inflammatory changes. Spleen: Spleen is nonenlarged.  Small splenule anteriorly. Adrenals/Urinary Tract: Right adrenal gland is preserved. There is a left adrenal nodule identified which previously would have measured 16 x 9 mm and today when measured in the same fashion would have measured 18 x 11 mm. By MRI in 2017 this was felt to be an adenoma. No enhancing renal mass or collecting system dilatation. Benign small bilateral renal cysts are again seen and unchanged. The ureters have normal course and caliber extending down to the bladder. Preserved contours of the urinary bladder. Stomach/Bowel: The large bowel has a normal course and caliber. Diffuse moderate colonic stool. Normal appendix in the right lower quadrant. The stomach is mildly distended with fluid. The small bowel is nondilated as well. Few areas of small bowel stool appearance in the right lower quadrant, nonspecific. Vascular/Lymphatic: Normal caliber aorta and IVC. No specific abnormal lymph node enlargement identified in the abdomen and pelvis. Reproductive: Status post hysterectomy. No adnexal masses. Other: No abdominal wall hernia or abnormality. No abdominopelvic ascites. No discrete omental nodularity or stranding today. Areas seen previously are less apparent today. Musculoskeletal: Mild degenerative changes. No aggressive liver lesions. IMPRESSION: No developing new mass lesion, fluid collection or lymph node enlargement. The areas of stranding in the mesentery are less visible today.  No new areas of nodularity or ascites. Fatty liver infiltration.  Gallstones.  Hepatic hemangioma. Stable left adrenal nodule when adjusted for variances in technique. Electronically Signed   By: Roosevelt Locks  Lyndel Safe M.D.   On: 01/07/2023 13:22     Assessment and plan- Patient is a 62 y.o. female with FIGO stage Ib endometrioid endometrial cancer pT1b N0 M0.  She is s/p 6 cycles of CarboTaxol chemotherapy followed by vaginal brachytherapy.  She is here to discuss CT scan results and further management  I have reviewed CT chest abdomen and pelvis images independently and discussed findings with the patient which does not show any evidence of recurrent or progressive disease.  She is in remission presently.  Moving forward she will be seen every 3 months alternating with GYN oncology and me.  No indication for routine surveillance imaging.  Imaging will be dictated based on suspicious signs and symptoms which I went over with her.  We will plan to get her port out at this time.  I will see her back in 2 months with labs and following that it will be every 6 months.  Chemo-induced peripheral neuropathy: Overall stable continue to monitor  We will refer her to occupational therapy for tightness in her thighs and legs which may be secondary to mild lymphedema in the setting of surgery and radiation therapy.    Visit Diagnosis 1. Endometrial carcinoma ([place])      Dr. Randa Evens, MD, MPH [facility] [id number] 01/08/2023 2:37 PM

## 2023-01-10 ENCOUNTER — Ambulatory Visit: Payer: Self-pay

## 2023-01-10 DIAGNOSIS — R2681 Unsteadiness on feet: Secondary | ICD-10-CM

## 2023-01-10 DIAGNOSIS — M6281 Muscle weakness (generalized): Secondary | ICD-10-CM

## 2023-01-10 LAB — CA 125: Cancer Antigen (CA) 125: 4.2 U/mL (ref 0.0–38.1)

## 2023-01-10 NOTE — Therapy (Signed)
OUTPATIENT PHYSICAL THERAPY BALANCE/DECONDITINIONG TREATMENT  Patient Name: Jaime Johnson MRN: [id number] DOB:Jan 05, 1961, 62 y.o., female Today's Date: 01/10/2023  END OF SESSION:  PT End of Session - 01/10/23 1453     Visit Number 9    Number of Visits 25    Date for PT Re-Evaluation 03/05/23    Authorization Type eval: 12/11/22;    PT Start Time 1447    PT Stop Time 1530    PT Time Calculation (min) 43 min    Activity Tolerance Patient tolerated treatment well    Behavior During Therapy [facility] for tasks assessed/performed            Past Medical History:  Diagnosis Date   Allergic rhinitis due to pollen    Endometrial cancer ([place])    Hyperlipidemia LDL goal <100 02/16/2020   Hypertension 12/19/2016   Past Surgical History:  Procedure Laterality Date   CESAREAN SECTION     COLONOSCOPY WITH PROPOFOL N/A 11/12/2016   Procedure: COLONOSCOPY WITH PROPOFOL;  Surgeon: Lucilla Lame, MD;  Location: [place];  Service: Endoscopy;  Laterality: N/A;   COLONOSCOPY WITH PROPOFOL N/A 03/29/2017   Procedure: COLONOSCOPY WITH PROPOFOL;  Surgeon: Lucilla Lame, MD;  Location: [place];  Service: Endoscopy;  Laterality: N/A;   COLONOSCOPY WITH PROPOFOL N/A 03/14/2020   Procedure: COLONOSCOPY WITH PROPOFOL;  Surgeon: Lucilla Lame, MD;  Location: [place];  Service: Endoscopy;  Laterality: N/A;  priority 4   IR IMAGING GUIDED PORT INSERTION  06/01/2022   POLYPECTOMY  11/12/2016   Procedure: POLYPECTOMY;  Surgeon: Lucilla Lame, MD;  Location: [place];  Service: Endoscopy;;   POLYPECTOMY  03/29/2017   Procedure: POLYPECTOMY;  Surgeon: Lucilla Lame, MD;  Location: [place];  Service: Endoscopy;;   Patient Active Problem List   Diagnosis Date Noted   Colon cancer ([place]) 05/22/2022   Endometrial carcinoma ([place]) 03/09/2022   Foraminal stenosis of cervical region (RIGHT) 03/16/2020   Hyperlipidemia LDL goal <100 02/16/2020   Cervical radicular pain  (RIGHT) 09/29/2019   Bilateral carpal tunnel syndrome 09/29/2019   Essential hypertension 12/19/2016   Allergic rhinitis due to pollen    PCP: Owens Loffler MD  REFERRING PROVIDER: Beckey Rutter NP  REFERRING DIAG: Endometrial carcinoma  RATIONALE FOR EVALUATION AND TREATMENT: Rehabilitation  THERAPY DIAG: Muscle weakness (generalized)  Unsteadiness on feet  ONSET DATE:   FOLLOW-UP APPT SCHEDULED WITH REFERRING PROVIDER: Yes, radiology this Thursday.   From initial evaluation note: SUBJECTIVE:  SUBJECTIVE STATEMENT:  General deconditioning  PERTINENT HISTORY:  Patient is 62 year old female diagnosed with endometrial cancer, s/p 6 cycles of carbo-taxol completed 10/08/22, and vaginal brachytherapy completed 10/29/22. She has been complaining of leg swelling, numbness, and pain of the toes/feet. Previously took gabapentin and lyrica for her symptoms but was unable to tolerate d/t side effects. Compression leggings have helped somewhat. She also complains today of hip and BLE weakness. Pt reports BLE shaking and low back/sacral pain as well as general joint stiffness. She reports that when she was diagnosed her cancer was initially classified as stage 1 but after lymph node testing they found evidence in the lymph channels and reclassified her to stage 3. Her next scan is during the third week of March.  Pain: No, no pain currently but occasional low back and bilateral hip pain; Numbness/Tingling: Yes, numbness, tingling, stabbling, and burning in stocking distribution BLE; Focal Weakness: Yes, BLE LE weakness Prior history of physical therapy for balance:  No Dominant hand: right Red flags: Positive for history of endometrial cancer. Negative for bowel/bladder changes, saddle paresthesia, h/o spinal  tumors, h/o compression fx, h/o abdominal aneurysm, abdominal pain, chills/fever, night sweats, nausea, and vomiting.  PRECAUTIONS: None  WEIGHT BEARING RESTRICTIONS: No  FALLS: Has patient fallen in last 6 months? No,   Living Environment Lives with: lives with their spouse Lives in: House/apartment Stairs:  1 step to enter , 2 level house (attic upstairs, 1 flight of stairs with handrail); Has following equipment at home: Single point cane, Walker - 2 wheeled, and Shower bench  Prior level of function: Independent, husband assists with IADLs if needed;  Occupational demands: Stopped working at [employer] prior to 2020;  Hobbies: walking, working in her garden;   Patient Goals: "I would like to be able to stretch and walk without pain"   OBJECTIVE:   Patient Surveys  FOTO: 50, predicted improvement to 52 ABC: To be completed  Cognition Patient is oriented to person, place, and time.  Recent memory is intact.  Remote memory is intact.  Attention span and concentration are intact.  Expressive speech is intact.  Patient's fund of knowledge is within normal limits for educational level.    Gross Musculoskeletal Assessment Tremor: None Bulk: Normal Tone: Normal No pitting edema noted in BLE  GAIT: Deferred full gait assessment but decrease self-selected speed;  Posture: No gross abnormalities noted in standing or seated posture  AROM Deferred  LE MMT: MMT (out of 5) Right  Left   Hip flexion 4 4  Hip extension    Hip abduction (seated) 4 4  Hip adduction (seated) 4 4  Hip internal rotation 4 4  Hip external rotation 4 4  Knee flexion 4 4  Knee extension 5 5  Ankle dorsiflexion 4 4  Ankle plantarflexion    Ankle inversion    Ankle eversion    (* = pain; Blank rows = not tested)  Sensation Deferred  Reflexes Deferred  Cranial Nerves Deferred  Coordination/Cerebellar Deferred  FUNCTIONAL OUTCOME MEASURES  Results Comments   BERG    DGI    FGA    TUG    5TSTS 33.0 seconds   2 Minute Walk Test    10 Meter Gait Speed    (Blank rows = not tested)   TODAY'S TREATMENT    SUBJECTIVE: Pt reports she is doing very well saying she continues to improve in her strength. Wants to graduate in the next 3 visits. Reports she  still feels her quads remain a little weak.    PAIN: Low back pain, not rated;   Ther-ex  NuStep L1-2 x 1.5 minutes followed by intervals L1/L8 30s on/45s off x 8 minutes at start of session for LE strengthening and cardiovascular conditioning with therapist adjusting resistance;  Forward 12" step-ups x 5 with each side;  Sideways 12" step ups" x5/side no UE support   Total Gym (TG) Level 22 (L22) double leg squats 2 x 10;  TG L22 double leg heel raises 2 x 10;  Nautilus resisted gait forward/backward 80# x 3 each;  Nautilus resisted gait R lateral and L lateral 70# x 2 each;  STS with 3x5, 10# med ball    PATIENT EDUCATION:  Education details: Pt educated throughout session about proper posture and technique with exercises. Improved exercise technique, movement at target joints, use of target muscles after min to mod verbal, visual, tactile cues. Person educated: Patient Education method: Explanation Education comprehension: verbalized understanding   HOME EXERCISE PROGRAM: Access Code: [email or web address] URL: [email or web address] Date: 12/25/2022 Prepared by: Roxana Hires  Exercises - Sit to Stand Without Arm Support  - 1 x daily - 7 x weekly - 3 sets - 5 reps - Seated Hip Abduction with Resistance  - 1 x daily - 7 x weekly - 3 sets - 5 reps - Seated Isometric Hip Adduction with Ball  - 1 x daily - 7 x weekly - 3 sets - 5 reps - Heel Raises with Counter Support  - 1 x daily - 7 x weekly - 2 sets - 10 reps - Feet Together Balance at [employer] Eyes Closed  - 1 x daily - 7 x weekly - 3 reps - 30s hold - Romberg Stance with Head Rotation  - 1 x daily - 7 x weekly - 3  reps - 30s hold   ASSESSMENT:  CLINICAL IMPRESSION: Continuing PT POC  with focus on progressive LE strengthening with reduced seated rest and brief standing rests to improve overall endurance. Pt very motivated tolerating all exercises well only displaying signs of fatigue with prolonged resisted walking. Pt will warrant progress note next session to assess progress towards all goals.    OBJECTIVE IMPAIRMENTS: decreased balance, decreased strength, and pain.   ACTIVITY LIMITATIONS: lifting, bending, sitting, standing, and squatting  PARTICIPATION LIMITATIONS: meal prep, cleaning, laundry, shopping, community activity, and yard work  PERSONAL FACTORS: Age, Past/current experiences, Time since onset of injury/illness/exacerbation, and 1-2 comorbidities: endometrial cancer and hypertension  are also affecting patient's functional outcome.   REHAB POTENTIAL: Good  CLINICAL DECISION MAKING: Evolving/moderate complexity  EVALUATION COMPLEXITY: Moderate   GOALS: Goals reviewed with patient? No  SHORT TERM GOALS: Target date: 01/22/2023   Pt will be independent with HEP in order to improve strength and balance in order to decrease fall risk and improve function at home. Baseline:  Goal status: INITIAL   LONG TERM GOALS: Target date: 03/05/23  Pt will increase FOTO to at least 63 to demonstrate significant improvement in function at home related to balance and leg strength. Baseline: 12/11/22: 50 Goal status: INITIAL  2.  Pt will improve BERG by at least 3 points in order to demonstrate clinically significant improvement in balance.   Baseline: 12/11/22: To be completed; 12/18/22: 53/56; Goal status: INITIAL  3. Pt will decrease TUG to below 14 seconds in order to demonstrate decreased fall risk.    Baseline: 12/11/22: 33.0s; Goal status: INITIAL  4. Pt will improve 30s Sit to  Stand Test by at least 2 repetitions in order to demonstrate improved BLE endurance and improve her ability  to walk and work in the yard.     Baseline: 12/11/22: To be completed; 12/13/22: 4 reps; Goal status: INITIAL  5. Pt will increase 2MWT by at least 14ft in order to demonstrate clinically significant improvement in cardiopulmonary endurance and community ambulation   Baseline: 12/18/22: 249' with hand held assist (5/10 RPE, 5/10 BORG Dyspnea); Goal status: INITIAL   PLAN: PT FREQUENCY: 2x/week  PT DURATION: 12 weeks  PLANNED INTERVENTIONS: Therapeutic exercises, Therapeutic activity, Neuromuscular re-education, Balance training, Gait training, Patient/Family education, Joint manipulation, Joint mobilization, Canalith repositioning, Aquatic Therapy, Dry Needling, Cognitive remediation, Electrical stimulation, Spinal manipulation, Spinal mobilization, Cryotherapy, Moist heat, Traction, Ultrasound, Ionotophoresis 4mg /ml Dexamethasone, and Manual therapy  PLAN FOR NEXT SESSION: Progress strengthening, incorporate balance exercises as necessary;   Salem Caster. Fairly IV, PT, DPT Physical Therapist- [facility]  01/10/2023, 4:21 PM

## 2023-01-14 NOTE — Therapy (Unsigned)
OUTPATIENT PHYSICAL THERAPY BALANCE/DECONDITINIONG TREATMENT/PROGRESS NOTE/DISCHARGE  Dates of reporting period  12/11/22   to   01/15/23   Patient Name: Jaime Johnson MRN: [id number] DOB:12/08/1960, 62 y.o., female Today's Date: 01/16/2023  END OF SESSION:  PT End of Session - 01/15/23 1442     Visit Number 10    Number of Visits 25    Date for PT Re-Evaluation 03/05/23    Authorization Type eval: 12/11/22;    PT Start Time 1445    PT Stop Time 1530    PT Time Calculation (min) 45 min    Activity Tolerance Patient tolerated treatment well    Behavior During Therapy [facility] for tasks assessed/performed            Past Medical History:  Diagnosis Date   Allergic rhinitis due to pollen    Endometrial cancer ([place])    Hyperlipidemia LDL goal <100 02/16/2020   Hypertension 12/19/2016   Past Surgical History:  Procedure Laterality Date   CESAREAN SECTION     COLONOSCOPY WITH PROPOFOL N/A 11/12/2016   Procedure: COLONOSCOPY WITH PROPOFOL;  Surgeon: Lucilla Lame, MD;  Location: [place];  Service: Endoscopy;  Laterality: N/A;   COLONOSCOPY WITH PROPOFOL N/A 03/29/2017   Procedure: COLONOSCOPY WITH PROPOFOL;  Surgeon: Lucilla Lame, MD;  Location: [place];  Service: Endoscopy;  Laterality: N/A;   COLONOSCOPY WITH PROPOFOL N/A 03/14/2020   Procedure: COLONOSCOPY WITH PROPOFOL;  Surgeon: Lucilla Lame, MD;  Location: [place];  Service: Endoscopy;  Laterality: N/A;  priority 4   IR IMAGING GUIDED PORT INSERTION  06/01/2022   POLYPECTOMY  11/12/2016   Procedure: POLYPECTOMY;  Surgeon: Lucilla Lame, MD;  Location: [place];  Service: Endoscopy;;   POLYPECTOMY  03/29/2017   Procedure: POLYPECTOMY;  Surgeon: Lucilla Lame, MD;  Location: [place];  Service: Endoscopy;;   Patient Active Problem List   Diagnosis Date Noted   Colon cancer ([place]) 05/22/2022   Endometrial carcinoma ([place]) 03/09/2022   Foraminal stenosis of cervical region (RIGHT)  03/16/2020   Hyperlipidemia LDL goal <100 02/16/2020   Cervical radicular pain (RIGHT) 09/29/2019   Bilateral carpal tunnel syndrome 09/29/2019   Essential hypertension 12/19/2016   Allergic rhinitis due to pollen    PCP: Owens Loffler MD  REFERRING PROVIDER: Beckey Rutter NP  REFERRING DIAG: Endometrial carcinoma  RATIONALE FOR EVALUATION AND TREATMENT: Rehabilitation  THERAPY DIAG: Muscle weakness (generalized)  Unsteadiness on feet  ONSET DATE:   FOLLOW-UP APPT SCHEDULED WITH REFERRING PROVIDER: Yes, radiology this Thursday.   From initial evaluation note: SUBJECTIVE:  SUBJECTIVE STATEMENT:  General deconditioning  PERTINENT HISTORY:  Patient is 62 year old female diagnosed with endometrial cancer, s/p 6 cycles of carbo-taxol completed 10/08/22, and vaginal brachytherapy completed 10/29/22. She has been complaining of leg swelling, numbness, and pain of the toes/feet. Previously took gabapentin and lyrica for her symptoms but was unable to tolerate d/t side effects. Compression leggings have helped somewhat. She also complains today of hip and BLE weakness. Pt reports BLE shaking and low back/sacral pain as well as general joint stiffness. She reports that when she was diagnosed her cancer was initially classified as stage 1 but after lymph node testing they found evidence in the lymph channels and reclassified her to stage 3. Her next scan is during the third week of March.  Pain: No, no pain currently but occasional low back and bilateral hip pain; Numbness/Tingling: Yes, numbness, tingling, stabbling, and burning in stocking distribution BLE; Focal Weakness: Yes, BLE LE weakness Prior history of physical therapy for balance:  No Dominant hand: right Red flags: Positive for history of endometrial  cancer. Negative for bowel/bladder changes, saddle paresthesia, h/o spinal tumors, h/o compression fx, h/o abdominal aneurysm, abdominal pain, chills/fever, night sweats, nausea, and vomiting.  PRECAUTIONS: None  WEIGHT BEARING RESTRICTIONS: No  FALLS: Has patient fallen in last 6 months? No,   Living Environment Lives with: lives with their spouse Lives in: House/apartment Stairs:  1 step to enter , 2 level house (attic upstairs, 1 flight of stairs with handrail); Has following equipment at home: Single point cane, Walker - 2 wheeled, and Shower bench  Prior level of function: Independent, husband assists with IADLs if needed;  Occupational demands: Stopped working at [employer] prior to 2020;  Hobbies: walking, working in her garden;   Patient Goals: "I would like to be able to stretch and walk without pain"   OBJECTIVE:   Patient Surveys  FOTO: 50, predicted improvement to 74 ABC: To be completed  Cognition Patient is oriented to person, place, and time.  Recent memory is intact.  Remote memory is intact.  Attention span and concentration are intact.  Expressive speech is intact.  Patient's fund of knowledge is within normal limits for educational level.    Gross Musculoskeletal Assessment Tremor: None Bulk: Normal Tone: Normal No pitting edema noted in BLE  GAIT: Deferred full gait assessment but decrease self-selected speed;  Posture: No gross abnormalities noted in standing or seated posture  AROM Deferred  LE MMT: MMT (out of 5) Right  Left   Hip flexion 4 4  Hip extension    Hip abduction (seated) 4 4  Hip adduction (seated) 4 4  Hip internal rotation 4 4  Hip external rotation 4 4  Knee flexion 4 4  Knee extension 5 5  Ankle dorsiflexion 4 4  Ankle plantarflexion    Ankle inversion    Ankle eversion    (* = pain; Blank rows = not tested)  Sensation Deferred  Reflexes Deferred  Cranial  Nerves Deferred  Coordination/Cerebellar Deferred  FUNCTIONAL OUTCOME MEASURES  Results Comments  BERG    DGI    FGA    TUG    5TSTS 33.0 seconds   2 Minute Walk Test    10 Meter Gait Speed    (Blank rows = not tested)   TODAY'S TREATMENT    SUBJECTIVE: Pt reports she is doing very well today. No reported pain upon arrival. She was very sore after the last therapy session but encouraged by  the exercise she was able to complete during the session. She is hopeful to graduate from therapy soon.   PAIN: Denies   Ther-ex  Updated outcome measures/goals with patient: 2MWT: 93' no assistive device  Pre-vitals: BP: 150/91 mmHg, HR: 81bpm, SpO2: 100% Post-vitals: HR: 95 bpm, SpO2: 99% (4/10 RPE, 8/10 BORG DOE);   TUG: 6.2s 30s Sit to Stand Test: 16 repetitions; BERG: 56/56; FGA: 28/30;  FOTO: 62;   NuStep L1-2 x 1.5 minutes followed by intervals L1/L8 45s on/60s off x 10 minutes at end of session for LE strengthening and cardiovascular conditioning with therapist adjusting resistance and monitoring fatigue. Afterward performed cool down x 5 minutes at level 1 during updated HEP review and discharge education;   PATIENT EDUCATION:  Education details: Outcome measures/goals and discharge. Updated HEP Person educated: Patient Education method: Explanation and handout Education comprehension: verbalized understanding   HOME EXERCISE PROGRAM: Access Code: [email or web address] URL: [email or web address] Date: 01/16/2023 Prepared by: Roxana Hires  Exercises - Sit to Stand Without Arm Support  - 1 x daily - 3 x weekly - 2 sets - 10 reps - Seated Hip Abduction with Resistance  - 1 x daily - 7 x weekly - 2 sets - 10 reps - Mini Squat with Counter Support  - 1 x daily - 3 x weekly - 2 sets - 10 reps - Heel Raises with Counter Support  - 1 x daily - 3 x weekly - 2 sets - 10 reps - 3s hold - Standing Hip Abduction with Resistance at Ankles and Counter Support  - 1 x daily -  3 x weekly - 2 sets - 10 reps - 3s hold - Standing Hip Extension with Resistance at Counter Support  - 1 x daily - 3 x weekly - 2 sets - 10 reps - 3s hold - Feet Together Balance at [employer] Eyes Closed  - 1 x daily - 3 x weekly - 3 reps - 30s hold - Romberg Stance with Head Rotation  - 1 x daily - 3 x weekly - 3 reps - 30s hold - Single Leg Stance with Support  - 1 x daily - 3 x weekly - 3 x 30s on each leg hold   ASSESSMENT:  CLINICAL IMPRESSION: Updated outcome measures with patient during visit today. She has made dramatic improvement since starting with therapy. Her FOTO score improved from 50 to 62 and her she is independent with her HEP. Her BERG Balance Test improved from 53/56 to 56/56 and her TUG decreased from 33s to 6.2s. At the initial evaluation pt was able to perform 4 sit to stand repetitions in 30s and today she was able to complete 16 repetitions. Her 2MWT improved from 55' initially to 33' today. At this time pt has both met all of her goals and subjectively reports feeling ready for discharge from therapy. Updated HEP and discharge education provided.    OBJECTIVE IMPAIRMENTS: decreased balance, decreased strength, and pain.   ACTIVITY LIMITATIONS: lifting, bending, sitting, standing, and squatting  PARTICIPATION LIMITATIONS: meal prep, cleaning, laundry, shopping, community activity, and yard work  PERSONAL FACTORS: Age, Past/current experiences, Time since onset of injury/illness/exacerbation, and 1-2 comorbidities: endometrial cancer and hypertension  are also affecting patient's functional outcome.   REHAB POTENTIAL: Good  CLINICAL DECISION MAKING: Evolving/moderate complexity  EVALUATION COMPLEXITY: Moderate   GOALS: Goals reviewed with patient? No  SHORT TERM GOALS: Target date: 01/22/2023   Pt will be independent with HEP in order to improve strength and  balance in order to decrease fall risk and improve function at home. Baseline:  Goal status:  INITIAL   LONG TERM GOALS: Target date: 03/05/23  Pt will increase FOTO to at least 63 to demonstrate significant improvement in function at home related to balance and leg strength. Baseline: 12/11/22: 50; 01/15/23: 62;  Goal status: PARTIALLY MET  2.  Pt will improve BERG by at least 3 points in order to demonstrate clinically significant improvement in balance.   Baseline: 12/11/22: To be completed; 12/18/22: 53/56; 01/15/23: 56/56; Goal status: ACHIEVED  3. Pt will decrease TUG to below 14 seconds in order to demonstrate decreased fall risk.    Baseline: 12/11/22: 33.0s; 01/15/23: 6.2s;  Goal status: ACHIEVED  4. Pt will improve 30s Sit to Stand Test by at least 2 repetitions in order to demonstrate improved BLE endurance and improve her ability to walk and work in the yard.     Baseline: 12/11/22: To be completed; 12/13/22: 4 reps; 01/15/23: 16 repetitions; Goal status: ACHIEVED  5. Pt will increase 2MWT by at least 56ft in order to demonstrate clinically significant improvement in cardiopulmonary endurance and community ambulation   Baseline: 12/18/22: 249' with hand held assist (5/10 RPE, 5/10 BORG Dyspnea); 01/15/23: 453' (4/10 RPE, 8/10 BORG DOE);  Goal status: ACHIEVED;   PLAN: PT FREQUENCY: 2x/week  PT DURATION: 12 weeks  PLANNED INTERVENTIONS: Therapeutic exercises, Therapeutic activity, Neuromuscular re-education, Balance training, Gait training, Patient/Family education, Joint manipulation, Joint mobilization, Canalith repositioning, Aquatic Therapy, Dry Needling, Cognitive remediation, Electrical stimulation, Spinal manipulation, Spinal mobilization, Cryotherapy, Moist heat, Traction, Ultrasound, Ionotophoresis 4mg /ml Dexamethasone, and Manual therapy  PLAN FOR NEXT SESSION: Discharge   Phillips Grout PT, DPT, GCS  Physical Therapist- [facility]  01/16/2023, 11:31 AM

## 2023-01-15 ENCOUNTER — Ambulatory Visit: Payer: Self-pay

## 2023-01-15 ENCOUNTER — Telehealth: Payer: Self-pay | Admitting: *Deleted

## 2023-01-15 DIAGNOSIS — M6281 Muscle weakness (generalized): Secondary | ICD-10-CM

## 2023-01-15 DIAGNOSIS — R2681 Unsteadiness on feet: Secondary | ICD-10-CM

## 2023-01-15 NOTE — Telephone Encounter (Signed)
Per Marcie Bal in IR, port removal will be scheduled for Thurs 3/28 at 10am with an arrival time of 9:30a. Pt will receive only local anes for port removal since her port has been in less than 5 years so she does not have to be NPO. She can drive herself. She does need to come to the [facility].  I left a detailed vm for patient. I requested pt to return my phone call to confirm that this port removal date will work for her. I will send her a mychart msg as well.

## 2023-01-16 ENCOUNTER — Other Ambulatory Visit: Payer: Self-pay | Admitting: Student

## 2023-01-16 DIAGNOSIS — C541 Malignant neoplasm of endometrium: Secondary | ICD-10-CM

## 2023-01-16 NOTE — Progress Notes (Signed)
Patient for IR Port Removal on Thurs 01/17/2023, I called and LVM for the patient on the phone and gave pre-procedure instructions. VM made pt aware to be here at 9:30a.  Called 01/16/2023

## 2023-01-17 ENCOUNTER — Ambulatory Visit
Admission: RE | Admit: 2023-01-17 | Discharge: 2023-01-17 | Disposition: A | Discharge status: Home / Self Care | Payer: Self-pay | Source: Ambulatory Visit | Attending: Oncology | Admitting: Oncology

## 2023-01-17 ENCOUNTER — Ambulatory Visit: Payer: Self-pay | Admitting: Radiology

## 2023-01-17 DIAGNOSIS — C541 Malignant neoplasm of endometrium: Secondary | ICD-10-CM

## 2023-01-17 DIAGNOSIS — Z452 Encounter for adjustment and management of vascular access device: Secondary | ICD-10-CM | POA: Insufficient documentation

## 2023-01-17 DIAGNOSIS — Z95828 Presence of other vascular implants and grafts: Secondary | ICD-10-CM

## 2023-01-17 HISTORY — PX: IR REMOVAL TUN ACCESS W/ PORT W/O FL MOD SED: IMG2290

## 2023-01-17 MED ORDER — LIDOCAINE-EPINEPHRINE 1 %-1:100000 IJ SOLN
INTRAMUSCULAR | Status: AC
  Administered 2023-01-17: 15 mL
  Filled 2023-01-17: qty 1

## 2023-01-17 NOTE — Procedures (Signed)
Pre Procedural Dx: Completed chemotherapy. Post Procedural Dx: Same  Successful removal of anterior chest wall port-a-cath.  EBL: Trace  No immediate post procedural complications.   Jay Perry Brucato, MD Pager #: [phone]   

## 2023-01-21 ENCOUNTER — Encounter: Payer: Self-pay | Admitting: Oncology

## 2023-01-22 ENCOUNTER — Other Ambulatory Visit: Payer: Self-pay | Admitting: *Deleted

## 2023-01-22 DIAGNOSIS — C541 Malignant neoplasm of endometrium: Secondary | ICD-10-CM

## 2023-01-22 DIAGNOSIS — R29898 Other symptoms and signs involving the musculoskeletal system: Secondary | ICD-10-CM

## 2023-01-22 DIAGNOSIS — I890 Lymphedema, not elsewhere classified: Secondary | ICD-10-CM

## 2023-01-22 NOTE — Telephone Encounter (Signed)
yes

## 2023-01-28 ENCOUNTER — Encounter: Payer: Self-pay | Attending: Hospice and Palliative Medicine

## 2023-01-28 DIAGNOSIS — C541 Malignant neoplasm of endometrium: Secondary | ICD-10-CM

## 2023-01-28 NOTE — Progress Notes (Signed)
Completed CARE phone interview with patient. Reviewed and updated any changes since finishing PT. Patient to complete orientation on 4/9- need clarification on compressions? for LE and protocol with exercise. Patient's referral still pending. All questions answered and program was discussed in length.

## 2023-01-29 VITALS — Ht 62.8 in | Wt 152.8 lb

## 2023-01-29 DIAGNOSIS — C541 Malignant neoplasm of endometrium: Secondary | ICD-10-CM

## 2023-01-29 NOTE — Progress Notes (Unsigned)
CARE Orientation  Patient Details  Name: Jaime Johnson MRN: [id number] Date of Birth: 1961-10-12 Referring Provider:    Encounter Date: 01/29/2023  Check In:  Session Check In - 01/29/23 1323       Check-In   Supervising physician immediately available to respond to emergencies See telemetry face sheet for immediately available ER MD    Location [facility]    Staff Present Salley Scarlet, MS, ACSM CEP, Exercise Physiologist;Jessica Juanetta Gosling, MA, RCEP, CCRP, CCET    Virtual Visit No    Medication changes reported     No    Fall or balance concerns reported    No    Warm-up and Cool-down Not performed (comment)   and Gym orientation   Resistance Training Performed No    VAD Patient? No    PAD/SET Patient? No              6 Minute Walk     Row Name 01/29/23 1327         6 Minute Walk   Phase Initial     Distance 935 feet     Walk Time 6 minutes     # of Rest Breaks 0     MPH 1.77     METS 2.63     RPE 11     Perceived Dyspnea  0     VO2 Peak 9.23     Symptoms Yes (comment)     Comments Left foot neuropathy pain     Resting HR 80 bpm     Resting BP 128/82     Resting Oxygen Saturation  97 %     Exercise Oxygen Saturation  during 6 min walk 96 %     Max Ex. HR 93 bpm     Max Ex. BP 142/82     2 Minute Post BP 132/80                Social History   Tobacco Use  Smoking Status Former   Types: Cigarettes   Quit date: 10/22/1978   Years since quitting: 44.3  Smokeless Tobacco Never    Goals Met:  Proper associated with RPD/PD & O2 Sat Exercise tolerated well Personal goals reviewed No report of concerns or symptoms today  Goals Unmet:  Not Applicable  Comments: First full day of orientation. All starting workloads were established based on the results of the 6 minute walk test done at initial orientation visit.  The plan for exercise progression was also introduced and progression will be customized based on patient's  performance and goals.    Dr. Bethann Punches is Medical Director for [facility] Cardiac Rehabilitation.  Dr. Vida Rigger is Medical Director for [facility] Pulmonary Rehabilitation.

## 2023-01-31 DIAGNOSIS — C541 Malignant neoplasm of endometrium: Secondary | ICD-10-CM

## 2023-01-31 NOTE — Progress Notes (Signed)
Daily Session Note  Patient Details  Name: Jaime Johnson MRN: [id number] Date of Birth: [birth year] Referring Provider:    Encounter Date: 01/31/2023  Check In:  Session Check In - 01/31/23 1308       Check-In   Supervising physician immediately available to respond to emergencies See telemetry face sheet for immediately available ER MD    Location [facility]    Staff Present Salley Scarlet, MS, ACSM CEP, Exercise Physiologist;Adisyn Ruscitti, BS, Exercise Physiologist    Virtual Visit No    Medication changes reported     No    Fall or balance concerns reported    No    Warm-up and Cool-down Performed on first and last piece of equipment    Resistance Training Performed Yes    VAD Patient? No    PAD/SET Patient? No      Pain Assessment   Currently in Pain? No/denies    Multiple Pain Sites No                Social History   Tobacco Use  Smoking Status Former   Types: Cigarettes   Quit date: 10/22/1978   Years since quitting: 44.3  Smokeless Tobacco Never    Goals Met:  Independence with exercise equipment Exercise tolerated well No report of concerns or symptoms today  Goals Unmet:  Not Applicable  Comments: First full day of exercise!  Patient was oriented to gym and equipment including functions, settings, policies, and procedures.  Patient's individual exercise prescription and treatment plan were reviewed.  All starting workloads were established based on the results of the 6 minute walk test done at initial orientation visit.  The plan for exercise progression was also introduced and progression will be customized based on patient's performance and goals.    Dr. Bethann Punches is Medical Director for [facility] Cardiac Rehabilitation.  Dr. Vida Rigger is Medical Director for [facility] Pulmonary Rehabilitation.

## 2023-02-05 DIAGNOSIS — C541 Malignant neoplasm of endometrium: Secondary | ICD-10-CM

## 2023-02-05 NOTE — Progress Notes (Signed)
Daily Session Note  Patient Details  Name: LYNNEX FULP MRN: [id number] Date of Birth: [birth year] Referring Provider:    Encounter Date: 02/05/2023  Check In:  Session Check In - 02/05/23 1315       Check-In   Supervising physician immediately available to respond to emergencies See telemetry face sheet for immediately available ER MD    Location [facility]    Staff Present Ronette Deter, BS, Exercise Physiologist;Jessica Interior, MA, RCEP, CCRP, CCET    Virtual Visit No    Medication changes reported     No    Fall or balance concerns reported    Yes    Comments No injuries    Tobacco Cessation No Change    Warm-up and Cool-down Performed on first and last piece of equipment    Resistance Training Performed Yes    VAD Patient? No    PAD/SET Patient? No      Pain Assessment   Currently in Pain? No/denies    Multiple Pain Sites No                Social History   Tobacco Use  Smoking Status Former   Types: Cigarettes   Quit date: 10/22/1978   Years since quitting: 44.3  Smokeless Tobacco Never    Goals Met:  Independence with exercise equipment Exercise tolerated well No report of concerns or symptoms today  Goals Unmet:  Not Applicable  Comments: Pt able to follow exercise prescription today without complaint.  Will continue to monitor for progression.    Dr. Bethann Punches is Medical Director for [facility] Cardiac Rehabilitation.  Dr. Vida Rigger is Medical Director for [facility] Pulmonary Rehabilitation.

## 2023-02-07 DIAGNOSIS — C541 Malignant neoplasm of endometrium: Secondary | ICD-10-CM

## 2023-02-07 NOTE — Progress Notes (Signed)
Daily Session Note  Patient Details  Name: Jaime Johnson MRN: [id number] Date of Birth: 12/02/1960 Referring Provider:    Encounter Date: 02/07/2023  Check In:  Session Check In - 02/07/23 1235       Check-In   Supervising physician immediately available to respond to emergencies See telemetry face sheet for immediately available ER MD    Location [facility]    Staff Present Ronette Deter, BS, Exercise Physiologist;Kara Clinton Sawyer, MS, ACSM CEP, Exercise Physiologist    Virtual Visit No    Medication changes reported     No    Fall or balance concerns reported    No    Warm-up and Cool-down Performed on first and last piece of equipment    Resistance Training Performed Yes    VAD Patient? No    PAD/SET Patient? No      Pain Assessment   Currently in Pain? No/denies    Multiple Pain Sites No                Social History   Tobacco Use  Smoking Status Former   Types: Cigarettes   Quit date: 10/22/1978   Years since quitting: 44.3  Smokeless Tobacco Never    Goals Met:  Independence with exercise equipment Exercise tolerated well No report of concerns or symptoms today  Goals Unmet:  Not Applicable  Comments: Pt able to follow exercise prescription today without complaint.  Will continue to monitor for progression.    Dr. Bethann Punches is Medical Director for [facility] Cardiac Rehabilitation.  Dr. Vida Rigger is Medical Director for [facility] Pulmonary Rehabilitation.

## 2023-02-12 DIAGNOSIS — C541 Malignant neoplasm of endometrium: Secondary | ICD-10-CM

## 2023-02-12 NOTE — Progress Notes (Signed)
Daily Session Note  Patient Details  Name: CAELEIGH PROHASKA MRN: [id number] Date of Birth: 01-15-1961 Referring Provider:    Encounter Date: 02/12/2023  Check In:  Session Check In - 02/12/23 1226       Check-In   Supervising physician immediately available to respond to emergencies See telemetry face sheet for immediately available ER MD    Location [facility]    Staff Present Ronette Deter, BS, Exercise Physiologist;Jessica Lushton, MA, RCEP, CCRP, CCET    Virtual Visit No    Medication changes reported     No    Fall or balance concerns reported    No    Warm-up and Cool-down Not performed (comment)   Yoga   Resistance Training Performed No    VAD Patient? No    PAD/SET Patient? No      Pain Assessment   Currently in Pain? No/denies    Multiple Pain Sites No                Social History   Tobacco Use  Smoking Status Former   Types: Cigarettes   Quit date: 10/22/1978   Years since quitting: 44.3  Smokeless Tobacco Never    Goals Met:  Independence with exercise equipment Exercise tolerated well No report of concerns or symptoms today  Goals Unmet:  Not Applicable  Comments: Pt able to follow exercise prescription today without complaint.  Will continue to monitor for progression.  Yoga exercise performed today led by Fabio Pierce MA, RCEP, CCRP, CCET.  Dr. Bethann Punches is Medical Director for [facility] Cardiac Rehabilitation.  Dr. Vida Rigger is Medical Director for [facility] Pulmonary Rehabilitation.

## 2023-02-14 DIAGNOSIS — C541 Malignant neoplasm of endometrium: Secondary | ICD-10-CM

## 2023-02-14 NOTE — Progress Notes (Signed)
Daily Session Note  Patient Details  Name: Jaime Johnson MRN: [id number] Date of Birth: February 08, 1961 Referring Provider:    Encounter Date: 02/14/2023  Check In:  Session Check In - 02/14/23 1245       Check-In   Supervising physician immediately available to respond to emergencies See telemetry face sheet for immediately available ER MD    Location [facility]    Staff Present Ronette Deter, BS, Exercise Physiologist;Melissa Caiola MS, RDN, LDN    Virtual Visit No    Medication changes reported     No    Fall or balance concerns reported    No    Warm-up and Cool-down Performed on first and last piece of equipment    Resistance Training Performed Yes    VAD Patient? No    PAD/SET Patient? No      Pain Assessment   Currently in Pain? No/denies    Multiple Pain Sites No                Social History   Tobacco Use  Smoking Status Former   Types: Cigarettes   Quit date: 10/22/1978   Years since quitting: 44.3  Smokeless Tobacco Never    Goals Met:  Independence with exercise equipment Exercise tolerated well No report of concerns or symptoms today  Goals Unmet:  Not Applicable  Comments: Pt able to follow exercise prescription today without complaint.  Will continue to monitor for progression.    Dr. Bethann Punches is Medical Director for [facility] Cardiac Rehabilitation.  Dr. Vida Rigger is Medical Director for [facility] Pulmonary Rehabilitation.

## 2023-02-19 DIAGNOSIS — C541 Malignant neoplasm of endometrium: Secondary | ICD-10-CM

## 2023-02-19 NOTE — Progress Notes (Signed)
Daily Session Note  Patient Details  Name: Jaime Johnson MRN: [id number] Date of Birth: [birth year] Referring Provider:    Encounter Date: 02/19/2023  Check In:  Session Check In - 02/19/23 1232       Check-In   Supervising physician immediately available to respond to emergencies See telemetry face sheet for immediately available ER MD    Location [facility]    Staff Present Ronette Deter, BS, Exercise Physiologist;Melissa Shiloh MS, RDN, Margarito Liner, MS, ACSM CEP, Exercise Physiologist;Jessica Juanetta Gosling, MA, RCEP, CCRP, CCET    Virtual Visit No    Medication changes reported     No    Fall or balance concerns reported    No    Warm-up and Cool-down Performed on first and last piece of equipment    Resistance Training Performed Yes    VAD Patient? No    PAD/SET Patient? No      Pain Assessment   Currently in Pain? No/denies    Multiple Pain Sites No                Social History   Tobacco Use  Smoking Status Former   Types: Cigarettes   Quit date: 10/22/1978   Years since quitting: 44.3  Smokeless Tobacco Never    Goals Met:  Independence with exercise equipment Exercise tolerated well No report of concerns or symptoms today  Goals Unmet:  Not Applicable  Comments: Pt able to follow exercise prescription today without complaint.  Will continue to monitor for progression.    Dr. Bethann Punches is Medical Director for [facility] Cardiac Rehabilitation.  Dr. Vida Rigger is Medical Director for [facility] Pulmonary Rehabilitation.

## 2023-02-21 ENCOUNTER — Encounter: Payer: Self-pay | Attending: Hospice and Palliative Medicine

## 2023-02-21 DIAGNOSIS — C541 Malignant neoplasm of endometrium: Secondary | ICD-10-CM | POA: Insufficient documentation

## 2023-02-21 NOTE — Progress Notes (Signed)
Daily Session Note  Patient Details  Name: Jaime Johnson MRN: [id number] Date of Birth: [birth year] Referring Provider:    Encounter Date: 02/21/2023  Check In:  Session Check In - 02/21/23 1303       Check-In   Supervising physician immediately available to respond to emergencies See telemetry face sheet for immediately available ER MD    Location [facility]    Staff Present Ronette Deter, BS, Exercise Physiologist;Kara Clinton Sawyer, MS, ACSM CEP, Exercise Physiologist    Virtual Visit No    Medication changes reported     No    Fall or balance concerns reported    No    Warm-up and Cool-down Performed on first and last piece of equipment    Resistance Training Performed Yes    VAD Patient? No    PAD/SET Patient? No      Pain Assessment   Currently in Pain? No/denies    Multiple Pain Sites No                Social History   Tobacco Use  Smoking Status Former   Types: Cigarettes   Quit date: 10/22/1978   Years since quitting: 44.3  Smokeless Tobacco Never    Goals Met:  Independence with exercise equipment Exercise tolerated well No report of concerns or symptoms today  Goals Unmet:  Not Applicable  Comments: Pt able to follow exercise prescription today without complaint.  Will continue to monitor for progression.    Dr. Bethann Punches is Medical Director for [facility] Cardiac Rehabilitation.  Dr. Vida Rigger is Medical Director for [facility] Pulmonary Rehabilitation.

## 2023-02-22 ENCOUNTER — Ambulatory Visit: Payer: Self-pay | Attending: Oncology | Admitting: Occupational Therapy

## 2023-02-22 ENCOUNTER — Encounter: Payer: Self-pay | Admitting: Occupational Therapy

## 2023-02-22 DIAGNOSIS — I890 Lymphedema, not elsewhere classified: Secondary | ICD-10-CM | POA: Insufficient documentation

## 2023-02-22 DIAGNOSIS — R278 Other lack of coordination: Secondary | ICD-10-CM | POA: Insufficient documentation

## 2023-02-22 DIAGNOSIS — M6281 Muscle weakness (generalized): Secondary | ICD-10-CM | POA: Insufficient documentation

## 2023-02-22 DIAGNOSIS — R2689 Other abnormalities of gait and mobility: Secondary | ICD-10-CM | POA: Insufficient documentation

## 2023-02-22 DIAGNOSIS — R29898 Other symptoms and signs involving the musculoskeletal system: Secondary | ICD-10-CM | POA: Insufficient documentation

## 2023-02-22 DIAGNOSIS — M533 Sacrococcygeal disorders, not elsewhere classified: Secondary | ICD-10-CM | POA: Insufficient documentation

## 2023-02-22 DIAGNOSIS — C541 Malignant neoplasm of endometrium: Secondary | ICD-10-CM | POA: Insufficient documentation

## 2023-02-22 NOTE — Therapy (Unsigned)
OUTPATIENT OCCUPATIONALTHERAPY EVALUATION LOWER EXTREMITY LYMPHEDEMA   Patient Name: Jaime Johnson MRN: [id number] DOB:February 08, 1961, 62 y.o., female Today's Date: 02/26/2023  END OF SESSION:   OT End of Session - 02/26/23 0817     Visit Number 1    Number of Visits 36    Date for OT Re-Evaluation 05/23/23    OT Start Time 0905    OT Stop Time 1020    OT Time Calculation (min) 75 min    Activity Tolerance Patient tolerated treatment well;No increased pain    Behavior During Therapy [facility] for tasks assessed/performed              Past Medical History:  Diagnosis Date   Allergic rhinitis due to pollen    Endometrial cancer (HCC)    Hyperlipidemia LDL goal <100 02/16/2020   Hypertension 12/19/2016   Past Surgical History:  Procedure Laterality Date   CESAREAN SECTION     COLONOSCOPY WITH PROPOFOL N/A 11/12/2016   Procedure: COLONOSCOPY WITH PROPOFOL;  Surgeon: Midge Minium, MD;  Location: [facility];  Service: Endoscopy;  Laterality: N/A;   COLONOSCOPY WITH PROPOFOL N/A 03/29/2017   Procedure: COLONOSCOPY WITH PROPOFOL;  Surgeon: Midge Minium, MD;  Location: [facility];  Service: Endoscopy;  Laterality: N/A;   COLONOSCOPY WITH PROPOFOL N/A 03/14/2020   Procedure: COLONOSCOPY WITH PROPOFOL;  Surgeon: Midge Minium, MD;  Location: [facility];  Service: Endoscopy;  Laterality: N/A;  priority 4   IR IMAGING GUIDED PORT INSERTION  06/01/2022   IR REMOVAL TUN ACCESS W/ PORT W/O FL MOD SED  01/17/2023   POLYPECTOMY  11/12/2016   Procedure: POLYPECTOMY;  Surgeon: Midge Minium, MD;  Location: [facility];  Service: Endoscopy;;   POLYPECTOMY  03/29/2017   Procedure: POLYPECTOMY;  Surgeon: Midge Minium, MD;  Location: [facility];  Service: Endoscopy;;   Patient Active Problem List   Diagnosis Date Noted   Colon cancer (HCC) 05/22/2022   Endometrial carcinoma (HCC) 03/09/2022   Foraminal stenosis of cervical region (RIGHT) 03/16/2020   Hyperlipidemia  LDL goal <100 02/16/2020   Cervical radicular pain (RIGHT) 09/29/2019   Bilateral carpal tunnel syndrome 09/29/2019   Essential hypertension 12/19/2016   Allergic rhinitis due to pollen     PCP: Kerin Perna, MD  REFERRING PROVIDER: Owens Shark, MD  REFERRING DIAG: I89.0  THERAPY DIAG:  Lymphedema, not elsewhere classified  Rationale for Evaluation and Treatment: Rehabilitation  ONSET DATE: ~10/23  SUBJECTIVE:  SUBJECTIVE STATEMENT: Jaime Johnson is referred by Creig Hines, MD, for evaluation and treatment of BLE lymphedema w onset after 2nd round of chemotherapy to treat endometrial cancer. Jaime Johnson reports her cancer was initially classified as stage 1 but after lymph node testing they found evidence in the lymph channels and reclassified her to stage 3. She is unsure how many lymph nodes, if any, were removed. Review of the medical record indicates 0/3 internal and external iliac LN involved. Jaime Johnson is s/p 6 cycles of carbo-taxol completed 10/08/22 and vaginal brachytherapy completed 10/29/22. She reports leg swelling  bilaterally at 4/10, L>R, w numbness and pain in her toes and feet. Compression leggings have helped a little, but compression stockings bunch at her ankles and cut in at the top of her leg causing deep indentations and pain. Pt reports leg pain and swelling never goes away. She gets relief from elevation in the recliner she sleeps in and reports symptoms are better in the morning. Walking , standing and summer heat makes symptoms worse.   PERTINENT HISTORY: Endometrial cancer s/p chemo and radiation; RA, Hx colon cancer, HTN, foraminal stenosis of cervical region,  B CTS,   PAIN:  Are you having pain? Yes: NPRS scale: 4/10 Pain location: legs (below knees)  and feet,  L>R Pain description: heaviness, tightness, numbness Aggravating factors: standing, walking, dependent sitting, hot weather Relieving factors: elevation overnight  PRECAUTIONS: Other: LYMPHEDEMA PRECAUTIONS  WEIGHT BEARING RESTRICTIONS: No  FALLS:  Has patient fallen in last 6 months? No  LIVING ENVIRONMENT: Lives with: spouse Lives in: House/apartment Stairs: Single story with 1 step to enter Has following equipment at home: shower chair and Grab bars  OCCUPATION: [employer] prior to 2020; currently unemployed  LEISURE: gardening, walking  HAND DOMINANCE: right   PRIOR LEVEL OF FUNCTION: Independent  PATIENT GOALS: reduce swelling and keep it from getting worse   OBJECTIVE:  COGNITION:  Overall cognitive status: Within functional limits for tasks assessed   FOTO functional outcome score:  02/22/23 Intake: 53%  LYMPHEDEMA LIFE IMPACT SCALE (LLIS): 02/22/23 Intake:   50%  OBSERVATIONS / OTHER ASSESSMENTS:   Mild, Stage  I, Bilateral Lower Extremity, Cancer-related,  Lymphedema 2/2 endometrial cancer treatment. Also suspect venous insufficiency as contributing factor.  Skin  Description Hyper-Keratosis Peau' de Orange Shiny Tight Fibrotic/ Indurated Fatty Doughy Spongy/ boggy          X below the knees bilaterally   Skin dry Flaky WNL Macerated   mildly      Color Redness Varicosities Blanching Hemosiderin Stain Mottled    x   X B feet and distal legs    Odor Malodorous Yeast Fungal infection  WNL      x   Temperature Warm Cool wnl    x     Pitting Edema   1+ 2+ 3+ 4+ Non-pitting         x   Girth Symmetrical Asymmetrical                   Distribution   TBA  Toes to popliteal fossa    Stemmer Sign Positive Negative    x   Lymphorrhea History Of:  Present Absent     x    Wounds History Of Present Absent Venous Arterial Pressure Non-healing     x        Signs of Infection Absent Warmth Erythema Acute Swelling Drainage  Borders   x  Sensation Light Touch Deep pressure Hypersensitivity Neuropathy   In Tact Impaired In Tact Impaired Absent Impaired    x  x  x  Chemo-induced x    Nails WNL   Fungus nail dystrophy   x     Hair Growth Symmetrical Asymmetrical   WNL    Skin Creases Base of toes  Ankles   Base of Fingers knees       Abdominal pannus Lobules  Face/neck             BLE COMPARATIVE LIMB VOLUMETRICS  Initial: TBA OT Rx visit 1  LANDMARK RIGHT    R LEG (A-D) N/A  R THIGH (E-G) ml  R FULL LIMB (A-G) ml  Limb Volume differential (LVD)  %  Volume change since initial %  Volume change overall V  (Blank rows = not tested)  LANDMARK LEFT   R LEG (A-D) N/A  R THIGH (E-G) ml  R FULL LIMB (A-G) ml  Limb Volume differential (LVD)  %  Volume change since initial %  Volume change overall %  (Blank rows = not tested)   POSTURE: head forward  LE ROM: WFL  LE MMT: WFL, easily fatigued  LYMPHEDEMA ASSESSMENTS:   LYMPHEDEMA LIFE IMPACT SCALE (LLIS):   TODAY'S TREATMENT:                                                                                                                                         OT EVALUATION PATIENT EDUCATION:  Education details: Provided Pt/ family education regarding lymphatic structure and function, lymphedema etiology, onset patterns and stages of progression. Taught interaction between blood circulatory system and lymphatic circulation.Discussed  impact of gravity and co-morbidities on lymphatic function. Outlined Complete Decongestive Therapy (CDT)  as standard of care and provided in depth information regarding 4 primary components of Intensive and Self Management Phases, including Manual Lymph Drainage (MLD), compression wrapping and garments, skin care, and therapeutic exercise. Homero Fellers discussion with re need for frequent attendance and high burden of care when caregiver is needed, impact of co morbidities. We discussed  the chronic,  progressive nature of lymphedema and Importance of daily, ongoing LE self-care essential for limiting progression and infection risk. Discussed the demanding nature of CDT, high burden of care between visits and throughout self-management phase,  and importance of high level of compliance with all home program components for optimal outcome.  Person educated: Patient Education method: Explanation, Demonstration, and Handouts Education comprehension: verbalized understanding, returned demonstration, and needs further education  LE self care HOME PROGRAM: BLE/BLQ lymphatic pumping there ex- 1 set of 10 each element, in order. Hold 5. 2 x daily 2. Daily compression-  appropriate compression garments for daytime use. (off the shelf or medical grade TBD) 3. Daily skin care with low ph lotion matching skin ph 4. Daily simple self MLD    ASSESSMENT:   CLINICAL IMPRESSION: Megan Salon  Agustin Cree presents with mild, stage I, BLE lymphedema 2/2 surgical intervention and radiation therapy for endometrial cancer. In keeping with stage I designation, swelling fluctuates, worsens and gets better, comes and goes, but at present does not fully resolve and worsens as the day progresses, especially when legs are dependent. Some mild, "spongy" fibrosis is palpable below the knees and at distal ankles. Since Pt presents with several symptoms that are also in keeping with venous insufficiency, including skin discoloration (hemosiderin stain?), swelling in legs and ankles improves with elevation, pain with walking improves with rest, and obvious varicose veins venous insufficiency is also a likely contributing condition. Pt will benefit from a Duplex Ultrasound to explore possible contribution to limb swelling, and to limit risk of DVT.  Despite the early stage I presentation,  chromic leg swelling and associated pain limits Ms Gendreau functional performance in all occupational domains, including basic and instrumental ADLs,  productive activities and leisure pursuits and social participation. Pt also expresses feelings of frustration due to chronic leg swelling and associated pain. Ms. Givler will benefit from skilled Occupational Therapy to reduce and control limb swelling and to limit lymphedema progression. Pt will  benefit from modified Intensive Phase CDT,  which typically includes multilayer compression bandaging, skin care, manual lymphatic drainage (MLD) and therapeutic exercise.  Because limb swelling at present is very mild we'll focus on utilizing comfortable and effective compression garments rather than multilayer bandaging.Emphasis will be directed at teaching lall aspects of lymphedema self care. Without skilled OT for lymphedema care, the condition may progress resulting in further functional decline.   REHAB POTENTIAL/PROGNOSIS: Negative factors affecting outcome include chronicity and length of time symptoms have been present. Positive prognosticators include motivation and medical stability. Prognosis for reducing swelling and limiting LE progression is good with regular attendance and high level of ongoing compliance with LE self-care home program.    OBJECTIVE IMPAIRMENTS: decreased standing, walking and dependent sitting tolerance, deconditioning, decreased balance, limited knowledge of lymphedema etiology,  progression and precautions, decreased knowledge of use of DME and compression, chronic nature of leg swelling and associated pain,  impaired sensation (neuropathy), postural dysfunction.   ACTIVITY LIMITATIONS: decreased basic ADLs (impaired self care performance, difficulty fitting shoes and LB clothing, LE limits sleep), limited IADLs performance ( shopping, cooking,cleaning, travel, driving), impaired productive activities (unable to perform work duties), impaired leisure pursuits ( gardening, walking), decreased social participation, decreased body image   EVALUATION COMPLEXITY: Moderate      GOALS: Goals reviewed with patient? Yes   SHORT TERM GOALS: Target date: 4th OT Rx visit    Pt will demonstrate understanding of lymphedema precautions and prevention strategies with modified independence using a printed reference to identify at least 5 precautions and discussing how s/he may implement them into daily life to reduce risk of progression with modified assistance Baseline: MAX A Goal status: INITIAL   LONG TERM GOALS: Target date: 05/23/23 (12 WEEKS)   Given this patient's Intake score of 50 % on the Lymphedema Life Impact Scale (LLIS), patient will experience a reduction of at least 5% in level of impairment resulting from lymphedema to improve functional performance and quality of life (QOL). Baseline: 50% (the extent to which lymphedema-related problems interfered with your life last week.) Goal status: INITIAL   2.  Given this patient's Intake score of 53 % on the functional outcomes FOTO tool, patient will experience an increase in function of 5 points to improve basic and instrumental ADLs performance, including lymphedema self-care. (TBA  at first OT Rx visit) Baseline: 54% Goal status: INITIAL   3.  With modified independence (extra time and assistive devices) Pt will be able to don and doff appropriate compression garments and/or devices to control BLE lymphedema and to limit progression.  Baseline: Max A Goal status: INITIAL   4. Pt will achieve at least a 5% volume reductions bilaterally below the knees to return limb to more typical size and shape, to limit infection risk and LE progression, to decrease pain, to improve function, and to improve body image and QOL. Baseline: Dependent Goal status: INITIAL   5. Pt will achieve and sustain at least 85% compliance with all LE self-care home program components throughout CDT, including modified simple self-MLD, daily skin care and inspection, lymphatic pumping the ex and appropriate compression to limit lymphedema  progression and to limit further functional decline. Baseline: Dependent Goal status: INITIAL  PLAN:   PT FREQUENCY: 1-2 x weekly and PRN   PT DURATION: 12 weeks and PRN   PLANNED INTERVENTIONS: Therapeutic exercises (lymphatic pumping),  Therapeutic activity, Patient/Family education for LE Self Care, Compression fitting, instructions and assessment, MLD,  skin care to limit infection risk and increase excursion   PLAN FOR NEXT SESSION:  Baseline comparative limb volumetrics Garment measurements given enough time Pt edu for LE self-care  Loel Dubonnet, MS, OTR/L, CLT-LANA 02/26/23 9:07 AM

## 2023-02-23 ENCOUNTER — Other Ambulatory Visit: Payer: Self-pay

## 2023-02-25 ENCOUNTER — Encounter: Payer: Self-pay | Admitting: Occupational Therapy

## 2023-02-26 ENCOUNTER — Encounter: Payer: Self-pay | Admitting: *Deleted

## 2023-02-26 DIAGNOSIS — C541 Malignant neoplasm of endometrium: Secondary | ICD-10-CM

## 2023-02-26 NOTE — Progress Notes (Signed)
Daily Session Note  Patient Details  Name: Jaime Johnson MRN: [id number] Date of Birth: 07/10/1961 Referring Provider:    Encounter Date: 02/26/2023  Check In:  Session Check In - 02/26/23 1239       Check-In   Supervising physician immediately available to respond to emergencies See telemetry face sheet for immediately available ER MD    Location [facility]    Staff Present Ronette Deter, BS, Exercise Physiologist;Kara Clinton Sawyer, MS, ACSM CEP, Exercise Physiologist;Chauntae Hults Juanetta Gosling, MA, RCEP, CCRP, CCET    Virtual Visit No    Medication changes reported     No    Fall or balance concerns reported    No    Warm-up and Cool-down Performed on first and last piece of equipment    Resistance Training Performed Yes    VAD Patient? No    PAD/SET Patient? No      Pain Assessment   Currently in Pain? No/denies                Social History   Tobacco Use  Smoking Status Former   Types: Cigarettes   Quit date: 10/22/1978   Years since quitting: 44.3  Smokeless Tobacco Never    Goals Met:  Proper associated with RPD/PD & O2 Sat Independence with exercise equipment Exercise tolerated well No report of concerns or symptoms today Strength training completed today  Goals Unmet:  Not Applicable  Comments: Pt able to follow exercise prescription today without complaint.  Will continue to monitor for progression.    Dr. Bethann Punches is Medical Director for [facility] Cardiac Rehabilitation.  Dr. Vida Rigger is Medical Director for [facility] Pulmonary Rehabilitation.

## 2023-02-28 DIAGNOSIS — C541 Malignant neoplasm of endometrium: Secondary | ICD-10-CM

## 2023-02-28 NOTE — Progress Notes (Signed)
Daily Session Note  Patient Details  Name: Jaime Johnson MRN: [id number] Date of Birth: 28-Jul-1961 Referring Provider:    Encounter Date: 02/28/2023  Check In:  Session Check In - 02/28/23 1256       Check-In   Supervising physician immediately available to respond to emergencies See telemetry face sheet for immediately available ER MD    Location [facility]    Staff Present Ronette Deter, BS, Exercise Physiologist;Kara Clinton Sawyer, MS, ACSM CEP, Exercise Physiologist    Virtual Visit No    Medication changes reported     No    Fall or balance concerns reported    No    Warm-up and Cool-down Performed on first and last piece of equipment    Resistance Training Performed Yes    VAD Patient? No    PAD/SET Patient? No      Pain Assessment   Currently in Pain? No/denies    Multiple Pain Sites No                Social History   Tobacco Use  Smoking Status Former   Types: Cigarettes   Quit date: 10/22/1978   Years since quitting: 44.3  Smokeless Tobacco Never    Goals Met:  Independence with exercise equipment Exercise tolerated well No report of concerns or symptoms today  Goals Unmet:  Not Applicable  Comments: Pt able to follow exercise prescription today without complaint.  Will continue to monitor for progression.    Dr. Bethann Punches is Medical Director for [facility] Cardiac Rehabilitation.  Dr. Vida Rigger is Medical Director for [facility] Pulmonary Rehabilitation.

## 2023-03-05 ENCOUNTER — Encounter: Payer: Self-pay | Admitting: Oncology

## 2023-03-05 ENCOUNTER — Inpatient Hospital Stay: Payer: Self-pay | Attending: Oncology

## 2023-03-05 ENCOUNTER — Inpatient Hospital Stay (HOSPITAL_BASED_OUTPATIENT_CLINIC_OR_DEPARTMENT_OTHER): Payer: Self-pay | Admitting: Oncology

## 2023-03-05 ENCOUNTER — Other Ambulatory Visit: Payer: Self-pay | Admitting: *Deleted

## 2023-03-05 VITALS — BP 141/83 | HR 64 | Temp 96.3°F | Resp 16 | Ht 63.36 in | Wt 148.2 lb

## 2023-03-05 DIAGNOSIS — Z79899 Other long term (current) drug therapy: Secondary | ICD-10-CM | POA: Insufficient documentation

## 2023-03-05 DIAGNOSIS — R9389 Abnormal findings on diagnostic imaging of other specified body structures: Secondary | ICD-10-CM

## 2023-03-05 DIAGNOSIS — C541 Malignant neoplasm of endometrium: Secondary | ICD-10-CM

## 2023-03-05 DIAGNOSIS — E041 Nontoxic single thyroid nodule: Secondary | ICD-10-CM

## 2023-03-05 LAB — CBC WITH DIFFERENTIAL (CANCER CENTER ONLY)
Abs Immature Granulocytes: 0.01 10*3/uL (ref 0.00–0.07)
Basophils Absolute: 0 10*3/uL (ref 0.0–0.1)
Basophils Relative: 1 %
Eosinophils Absolute: 0.1 10*3/uL (ref 0.0–0.5)
Eosinophils Relative: 2 %
HCT: 42.9 % (ref 36.0–46.0)
Hemoglobin: 14.2 g/dL (ref 12.0–15.0)
Immature Granulocytes: 0 %
Lymphocytes Relative: 24 %
Lymphs Abs: 1.2 10*3/uL (ref 0.7–4.0)
MCH: 28.5 pg (ref 26.0–34.0)
MCHC: 33.1 g/dL (ref 30.0–36.0)
MCV: 86 fL (ref 80.0–100.0)
Monocytes Absolute: 0.3 10*3/uL (ref 0.1–1.0)
Monocytes Relative: 6 %
Neutro Abs: 3.3 10*3/uL (ref 1.7–7.7)
Neutrophils Relative %: 67 %
Platelet Count: 163 10*3/uL (ref 150–400)
RBC: 4.99 MIL/uL (ref 3.87–5.11)
RDW: 12.9 % (ref 11.5–15.5)
WBC Count: 4.9 10*3/uL (ref 4.0–10.5)
nRBC: 0 % (ref 0.0–0.2)

## 2023-03-05 LAB — CMP (CANCER CENTER ONLY)
ALT: 35 U/L (ref 0–44)
AST: 31 U/L (ref 15–41)
Albumin: 4.6 g/dL (ref 3.5–5.0)
Alkaline Phosphatase: 62 U/L (ref 38–126)
Anion gap: 11 (ref 5–15)
BUN: 16 mg/dL (ref 8–23)
CO2: 27 mmol/L (ref 22–32)
Calcium: 9.4 mg/dL (ref 8.9–10.3)
Chloride: 104 mmol/L (ref 98–111)
Creatinine: 0.82 mg/dL (ref 0.44–1.00)
GFR, Estimated: >60 mL/min (ref >60)
Glucose, Bld: 121 mg/dL — ABNORMAL HIGH (ref 70–99)
Potassium: 4.6 mmol/L (ref 3.5–5.1)
Sodium: 142 mmol/L (ref 135–145)
Total Bilirubin: 1.2 mg/dL (ref 0.3–1.2)
Total Protein: 7.6 g/dL (ref 6.5–8.1)

## 2023-03-05 NOTE — Progress Notes (Signed)
Hematology/Oncology Consult note [facility]  [phone] [phone]  Patient Care Team: Hannah Beat, MD as PCP - General (Family Medicine) Jim Like, RN as Registered Nurse Scarlett Presto, RN (Inactive) as Registered Nurse Benita Gutter, RN as Oncology Nurse Navigator Carmina Miller, MD as Consulting Physician (Radiation Oncology) Artelia Laroche, MD as Referring Physician (Obstetrics) Creig Hines, MD as Consulting Physician (Oncology)   Name of the patient: Jaime Johnson  [id number]  [birth year]   Date of visit: 03/05/23  Diagnosis- 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 Tresa Endo from GYN oncology at [facility].  She underwent robotic surgery on 03/30/2022.  Final pathology showed:    Final Pathologic Diagnosis  A. SENTINEL LYMPH NODE, RIGHT EXTERNAL ILIAC ARTERY, RESECTION: Two lymph nodes, negative for metastatic carcinoma (0/2).    B. SENTINEL LYMPH NODE, LEFT EXTERNAL ILIAC ARTERY, RESECTION: One lymph node, negative for metastatic carcinoma (0/1).    C. UTERUS, FALLOPIAN TUBE AND OVARIES, HYSTERECTOMY WITH BILATERAL SALPINGO-OOPHORECTOMY: Endometrioid adenocarcinoma, FIGO grade 2. Tumor measures 5 cm in greatest dimension.  Tumor invades 14 of 17 mm thick myometrium (greater than 50%).  Margins of resection are uninvolved by tumor.  Tumor involves lower uterine segment.  Cervix is uninvolved by tumor.  Benign right fallopian tube and ovary.  Left ovary with tumor located within lymphovascular spaces, no tissue invasion identified.  Left fallopian tube with tumor within lymphovascular spaces, no tissue invasion identified. Free  floating tumor located inside left fallopian tube.  pT1b pN0    She was recommended adjuvant chemotherapy with Ledell Noss Taxol for 3 cycles by Dr. Tresa Endo followed by vaginal brachytherapy.  Patient states that she does not wish to travel all the way to [facility] given her ongoing fatigue as well as discomfort from episiotomy and would prefer to get treatment locally.  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  She is not a diabetic.  She has baseline rheumatoid arthritis   Pathology was also second for a second opinion to [facility] as well.  Path report over there suggested that this was a T3a tumor FIGO stage IIIa disease    Interval history-patient has occasional bouts of both urinary and fecal incontinence since radiation.  She will also be seen physical therapy for symptoms of pelvic congestion  ECOG PS- 1 Pain scale- 0 Opioid associated constipation- no  Review of systems- Review of Systems  Constitutional:  Negative for chills, fever, malaise/fatigue and weight loss.  HENT:  Negative for congestion, ear discharge and nosebleeds.   Eyes:  Negative for blurred vision.  Respiratory:  Negative for cough, hemoptysis, sputum production, shortness of breath and wheezing.   Cardiovascular:  Negative for chest pain, palpitations, orthopnea and claudication.  Gastrointestinal:  Negative for abdominal pain, blood in stool, constipation, diarrhea, heartburn, melena, nausea and vomiting.  Genitourinary:  Negative for dysuria, flank pain, frequency, hematuria and urgency.  Musculoskeletal:  Negative for back pain, joint pain and myalgias.  Skin:  Negative for rash.  Neurological:  Negative for dizziness, tingling, focal weakness, seizures, weakness and headaches.  Endo/Heme/Allergies:  Does not bruise/bleed easily.  Psychiatric/Behavioral:  Negative for depression and suicidal ideas. The patient does not have insomnia.  Allergies  Allergen Reactions   Azithromycin Other (See  Comments)    Gets a bad yeast infection from this.     Past Medical History:  Diagnosis Date   Allergic rhinitis due to pollen    Endometrial cancer (HCC)    Hyperlipidemia LDL goal <100 02/16/2020   Hypertension 12/19/2016     Past Surgical History:  Procedure Laterality Date   CESAREAN SECTION     COLONOSCOPY WITH PROPOFOL N/A 11/12/2016   Procedure: COLONOSCOPY WITH PROPOFOL;  Surgeon: Midge Minium, MD;  Location: [facility];  Service: Endoscopy;  Laterality: N/A;   COLONOSCOPY WITH PROPOFOL N/A 03/29/2017   Procedure: COLONOSCOPY WITH PROPOFOL;  Surgeon: Midge Minium, MD;  Location: [facility];  Service: Endoscopy;  Laterality: N/A;   COLONOSCOPY WITH PROPOFOL N/A 03/14/2020   Procedure: COLONOSCOPY WITH PROPOFOL;  Surgeon: Midge Minium, MD;  Location: [facility];  Service: Endoscopy;  Laterality: N/A;  priority 4   IR IMAGING GUIDED PORT INSERTION  06/01/2022   IR REMOVAL TUN ACCESS W/ PORT W/O FL MOD SED  01/17/2023   POLYPECTOMY  11/12/2016   Procedure: POLYPECTOMY;  Surgeon: Midge Minium, MD;  Location: [facility];  Service: Endoscopy;;   POLYPECTOMY  03/29/2017   Procedure: POLYPECTOMY;  Surgeon: Midge Minium, MD;  Location: [facility];  Service: Endoscopy;;    Social History   Socioeconomic History   Marital status: Married    Spouse name: Not on file   Number of children: Not on file   Years of education: Not on file   Highest education level: Not on file  Occupational History   Not on file  Tobacco Use   Smoking status: Former    Types: Cigarettes    Quit date: 10/22/1978    Years since quitting: 44.3   Smokeless tobacco: Never  Vaping Use   Vaping Use: Never used  Substance and Sexual Activity   Alcohol use: No   Drug use: No   Sexual activity: Yes    Partners: Male  Other Topics Concern   Not on file  Social History Narrative   Not on file   Social Determinants of Health   Financial Resource Strain: Low Risk   (06/22/2022)   Overall Financial Resource Strain (CARDIA)    Difficulty of Paying Living Expenses: Not very hard  Food Insecurity: No Food Insecurity (06/22/2022)   Hunger Vital Sign    Worried About Running Out of Food in the Last Year: Never true    Ran Out of Food in the Last Year: Never true  Transportation Needs: No Transportation Needs (06/22/2022)   PRAPARE - Administrator, Civil Service (Medical): No    Lack of Transportation (Non-Medical): No  Physical Activity: Inactive (06/22/2022)   Exercise Vital Sign    Days of Exercise per Week: 0 days    Minutes of Exercise per Session: 0 min  Stress: Stress Concern Present (06/22/2022)   [employer] of Occupational Health - Occupational Stress Questionnaire    Feeling of Stress : To some extent  Social Connections: Socially Integrated (06/22/2022)   Social Connection and Isolation Panel [NHANES]    Frequency of Communication with Friends and Family: Three times a week    Frequency of Social Gatherings with Friends and Family: Three times a week    Attends Religious Services: 1 to 4 times per year    Active Member of Clubs or Organizations: No    Attends Banker Meetings: 1  to 4 times per year    Marital Status: Married  Catering manager Violence: Not At Risk (06/22/2022)   Humiliation, Afraid, Rape, and Kick questionnaire    Fear of Current or Ex-Partner: No    Emotionally Abused: No    Physically Abused: No    Sexually Abused: No    Family History  Problem Relation Age of Onset   Hepatitis C Mother 60       d/c at 90   Hypertension Father    Hepatitis C Brother    Breast cancer Neg Hx      Current Outpatient Medications:    diazepam (VALIUM) 5 MG tablet, TAKE 1 TABLET(5 MG) BY MOUTH EVERY 6 HOURS AS NEEDED FOR ANXIETY, Disp: 30 tablet, Rfl: 3   lisinopril (ZESTRIL) 40 MG tablet, Take 1 tablet (40 mg total) by mouth daily., Disp: 30 tablet, Rfl: 3   Multiple Vitamins-Minerals (MULTIVITAMIN GUMMIES  ADULTS PO), Take by mouth daily., Disp: , Rfl:    ondansetron (ZOFRAN-ODT) 8 MG disintegrating tablet, Take 8 mg by mouth every 8 (eight) hours as needed., Disp: , Rfl:    traMADol (ULTRAM) 50 MG tablet, TAKE 1 TABLET(50 MG) BY MOUTH DAILY, Disp: 30 tablet, Rfl: 3  Physical exam:  Vitals:   03/05/23 0939  BP: (!) 141/83  Pulse: 64  Resp: 16  Temp: (!) 96.3 F (35.7 C)  TempSrc: Tympanic  SpO2: 100%  Weight: 148 lb 3.2 oz (67.2 kg)  Height: 5' 3.36" (1.609 m)   Physical Exam Cardiovascular:     Rate and Rhythm: Normal rate and regular rhythm.     Heart sounds: Normal heart sounds.  Pulmonary:     Effort: Pulmonary effort is normal.     Breath sounds: Normal breath sounds.  Abdominal:     General: Bowel sounds are normal. There is no distension.     Palpations: Abdomen is soft.     Tenderness: There is no abdominal tenderness.  Musculoskeletal:     Right lower leg: No edema.     Left lower leg: No edema.  Skin:    General: Skin is warm and dry.  Neurological:     Mental Status: She is alert and oriented to person, place, and time.         Latest Ref Rng & Units 03/05/2023    9:28 AM  CMP  Glucose 70 - 99 mg/dL 213   BUN 8 - 23 mg/dL 16   Creatinine 0.86 - 1.00 mg/dL 5.78   Sodium 469 - 629 mmol/L 142   Potassium 3.5 - 5.1 mmol/L 4.6   Chloride 98 - 111 mmol/L 104   CO2 22 - 32 mmol/L 27   Calcium 8.9 - 10.3 mg/dL 9.4   Total Protein 6.5 - 8.1 g/dL 7.6   Total Bilirubin 0.3 - 1.2 mg/dL 1.2   Alkaline Phos 38 - 126 U/L 62   AST 15 - 41 U/L 31   ALT 0 - 44 U/L 35       Latest Ref Rng & Units 03/05/2023    9:29 AM  CBC  WBC 4.0 - 10.5 K/uL 4.9   Hemoglobin 12.0 - 15.0 g/dL 52.8   Hematocrit 41.3 - 46.0 % 42.9   Platelets 150 - 400 K/uL 163      Assessment and plan- Patient is a 62 y.o. female  with FIGO stage Ib endometrioid endometrial cancer pT1b N0 M0.  She is s/p 6 cycles of CarboTaxol chemotherapy followed by vaginal brachytherapy.  She is here for  routine surveillance visit  Clinically patient is doing well with no concerning signs and symptoms of recurrence based on today's exam.  She was found to have a right thyroid nodule measuring 1.4 cm on her PET scan in August 2023.  I will plan to get a thyroid ultrasound at this time to follow-up on that.  Patient is meeting up with physical therapy regarding her symptoms of pelvic congestion syndrome.  I will see her back in 6 months with CBC with differential and CMP.  Patient will also continue to follow-up with GYN oncology.   Visit Diagnosis 1. Endometrial carcinoma (HCC)   2. Abnormal CT scan      Dr. Owens Shark, MD, MPH [facility] [phone] 03/05/2023 12:28 PM

## 2023-03-06 ENCOUNTER — Other Ambulatory Visit: Payer: Self-pay

## 2023-03-06 ENCOUNTER — Ambulatory Visit
Admission: RE | Admit: 2023-03-06 | Discharge: 2023-03-06 | Disposition: A | Discharge status: Home / Self Care | Payer: Self-pay | Source: Ambulatory Visit | Attending: Oncology | Admitting: Oncology

## 2023-03-06 ENCOUNTER — Ambulatory Visit: Payer: Self-pay | Admitting: Physical Therapy

## 2023-03-06 DIAGNOSIS — R2689 Other abnormalities of gait and mobility: Secondary | ICD-10-CM

## 2023-03-06 DIAGNOSIS — R9389 Abnormal findings on diagnostic imaging of other specified body structures: Secondary | ICD-10-CM | POA: Insufficient documentation

## 2023-03-06 DIAGNOSIS — C541 Malignant neoplasm of endometrium: Secondary | ICD-10-CM | POA: Insufficient documentation

## 2023-03-06 DIAGNOSIS — M533 Sacrococcygeal disorders, not elsewhere classified: Secondary | ICD-10-CM

## 2023-03-06 DIAGNOSIS — M6281 Muscle weakness (generalized): Secondary | ICD-10-CM

## 2023-03-06 DIAGNOSIS — R278 Other lack of coordination: Secondary | ICD-10-CM

## 2023-03-06 NOTE — Patient Instructions (Signed)
   Proper body mechanics with getting out of a chair to decrease strain  on back &pelvic floor   Avoid holding your breath when Getting out of the chair:  Scoot to front part of chair chair Heels behind knees, feet are hip width apart, nose over toes  Inhale like you are smelling roses Exhale to stand   __  Sitting with feet on ground, four points of contact Catch yourself crossing ankles and thighs  __  

## 2023-03-06 NOTE — Therapy (Addendum)
OUTPATIENT PHYSICAL THERAPY EVALUATION   Patient Name: Jaime Johnson MRN: [id number] DOB:1961-10-22, 62 y.o., female Today's Date: 03/07/2023   PT End of Session - 03/06/23 0900     Visit Number 1    Number of Visits 10    Date for PT Re-Evaluation 05/15/23    PT Start Time 0850    PT Stop Time 0930    PT Time Calculation (min) 40 min    Activity Tolerance Patient tolerated treatment well;No increased pain    Behavior During Therapy [facility] for tasks assessed/performed             Past Medical History:  Diagnosis Date   Allergic rhinitis due to pollen    Endometrial cancer (HCC)    Hyperlipidemia LDL goal <100 02/16/2020   Hypertension 12/19/2016   Past Surgical History:  Procedure Laterality Date   CESAREAN SECTION     COLONOSCOPY WITH PROPOFOL N/A 11/12/2016   Procedure: COLONOSCOPY WITH PROPOFOL;  Surgeon: Midge Minium, MD;  Location: [facility];  Service: Endoscopy;  Laterality: N/A;   COLONOSCOPY WITH PROPOFOL N/A 03/29/2017   Procedure: COLONOSCOPY WITH PROPOFOL;  Surgeon: Midge Minium, MD;  Location: [facility];  Service: Endoscopy;  Laterality: N/A;   COLONOSCOPY WITH PROPOFOL N/A 03/14/2020   Procedure: COLONOSCOPY WITH PROPOFOL;  Surgeon: Midge Minium, MD;  Location: [facility];  Service: Endoscopy;  Laterality: N/A;  priority 4   IR IMAGING GUIDED PORT INSERTION  06/01/2022   IR REMOVAL TUN ACCESS W/ PORT W/O FL MOD SED  01/17/2023   POLYPECTOMY  11/12/2016   Procedure: POLYPECTOMY;  Surgeon: Midge Minium, MD;  Location: [facility];  Service: Endoscopy;;   POLYPECTOMY  03/29/2017   Procedure: POLYPECTOMY;  Surgeon: Midge Minium, MD;  Location: [facility];  Service: Endoscopy;;   Patient Active Problem List   Diagnosis Date Noted   Colon cancer (HCC) 05/22/2022   Endometrial carcinoma (HCC) 03/09/2022   Foraminal stenosis of cervical region (RIGHT) 03/16/2020   Hyperlipidemia LDL goal <100 02/16/2020   Cervical radicular  pain (RIGHT) 09/29/2019   Bilateral carpal tunnel syndrome 09/29/2019   Essential hypertension 12/19/2016   Allergic rhinitis due to pollen     PCP: [place]  REFERRING PROVIDER:  Smith Robert  REFERRING DIAG:  Diagnosis  C54.1 (ICD-10-CM) - Endometrial carcinoma (HCC)  I89.0 (ICD-10-CM) - Lymphedema  R29.898 (ICD-10-CM) - Weakness of both lower extremities    Rationale for Evaluation and Treatment Rehabilitation  THERAPY DIAG:  Sacrococcygeal disorders, not elsewhere classified  Muscle weakness (generalized)  Other lack of coordination  Other abnormalities of gait and mobility  Sacrococcygeal disorders, not elsewhere classified  ONSET DATE:  Endometrial CA Dx May 2023 with hysterectomy with complications and 5 brachi radiation Tx   SUBJECTIVE:  SUBJECTIVE STATEMENT: 1)  bowel and urinary leakage:  Pt completed radiation therapy for endometrial cancer in January. Following radiation, pt noticed bowel and urinary leakage. Pt has not had sexual intercourse for over a year because she is afraid. Pt had a hysterectomy.  Pt has no leakage during her exercise classes. Pt has 2 episodes of urinary leakage per day. Pt has fecal leakage a couple times before making it to the bathroom.   2) pelvis locks up, it "feels like a brick plate":  when standing up from sitting, it takes a few seconds for her leg to feel strong. It takes 30 mins to drive here. Pt has to stand at side of the car to loosen up her pelvis before walking.  After sitting after 20 min, she feels the pelvis locks. But when she is on the treadmill, there is not issues the pelvis locking up. After exercising, the pelvis locks up.     Endometrial CA Dx May 2023 with hysterectomy with complications where her vaginal wall was cut to find a lost needle,  chemotherapy for 16 weeks,   5 brachi radiation Tx   PERTINENT HISTORY:  Pt completed Physical Therapy after radiation and understood that she had spent time on her recliner for so long. Pt also is now going through [employer] classes and about to start Lymphedema.  Pt has an appt today for [place] to look at the growth in her thyroid.  Endometrial CA Dx May 2023 with hysterectomy with complications where her vaginal wall was cut to find a lost needle, chemotherapy for 16 weeks,   5 brachi radiation Tx    PAIN:  Are you having pain? Yes: see above   PRECAUTIONS: None  WEIGHT BEARING RESTRICTIONS: No  FALLS:  Has patient fallen in last 6 months? No  LIVING ENVIRONMENT: Lives with: lives with their family Lives in: House/apartment Stairs:    OCCUPATION:   PLOF: Independent  PATIENT GOALS:    OBJECTIVE:    [facility] PT Assessment -      Observation/Other Assessments   Observations ankles crossed, reported L LBP with seated stretch when R hip flex/abd/ER , L hip ext      Palpation   SI assessment  R iliac crest higher    Palpation comment abdominal scar restrictions      Ambulation/Gait   Gait Comments 0.7 m/s limited armswing, thoracic mobility, excessive L pelvis sway             [facility] Adult PT Treatment/Exercise -       Therapeutic Activites    Therapeutic Activities Other Therapeutic Activities    Other Therapeutic Activities see pt education section, provided seated stretch for pelvis/ hip              HOME EXERCISE PROGRAM: See pt instruction section    ASSESSMENT:  CLINICAL IMPRESSION:  Pt is a  62  yo  who presents with  bowel and urinary leakage and pelvic pain   which impact QOL, ADL, fitness, and community activities. Pertinent Hx: Pt also is now going through [employer] classes and about to start Lymphedema.  Pt has an appt today for [place] to look at the growth in her thyroid.  Endometrial CA Dx May 2023 with hysterectomy with complications where  her vaginal wall was cut to find a lost needle, chemotherapy for 16 weeks,   5 brachi radiation Tx . Pt is self-pay.   Pt's musculoskeletal assessment revealed uneven pelvic girdle and shoulder  height, asymmetries to gait pattern, limited spinal /pelvic mobility, dyscoordination and strength of pelvic floor mm, scar restrictions. These deficits impact her Sx.   Pt was provided education on etiology of Sx with anatomy, physiology explanation with images along with the benefits of customized pelvic PT Tx based on pt's medical conditions and musculoskeletal deficits.  Explained the physiology of deep core mm coordination and roles of pelvic floor function in urination, defecation, sexual function, and postural control with deep core mm system.   Regional interdependent approaches will yield greater benefits in pt's POC. Pt would benefit from a biopsychosocial approach to yield optimal outcomes. Plan to build interdisciplinary team with pt's providers to optimize patient-centered care.   Following Tx today which pt tolerated without complaints, pt demo'd improved sit-to-stand body mechanics and seated stretch to promote SIJ/ hip mobility.     Plan to address pt's misalignment of pelvis at next session.   Pt benefits from skilled PT.    OBJECTIVE IMPAIRMENTS decreased activity tolerance, decreased coordination, decreased endurance, decreased mobility, difficulty walking, decreased ROM, decreased strength, decreased safety awareness, hypomobility, increased muscle spasms, impaired flexibility, improper body mechanics, postural dysfunction, and pain. scar restrictions   ACTIVITY LIMITATIONS  self-care home chores, work tasks    PARTICIPATION LIMITATIONS:  community, gym activities    PERSONAL FACTORS  Pt has multiple medical appts in addition to PT which  are also affecting patient's functional outcome. Pt also is now going through [employer] classes and about to start Lymphedema.  Pt has an appt  today for [place] to look at the growth in her thyroid.  Endometrial CA Dx May 2023 with hysterectomy with complications where her vaginal wall was cut to find a lost needle, chemotherapy for 16 weeks,   5 brachi radiation Tx     REHAB POTENTIAL: Good   CLINICAL DECISION MAKING: Evolving/moderate complexity   EVALUATION COMPLEXITY: Moderate    PATIENT EDUCATION:    Education details: Showed pt anatomy images. Explained muscles attachments/ connection, physiology of deep core system/ spinal- thoracic-pelvis-lower kinetic chain as they relate to pt's presentation, Sx, and past Hx. Explained what and how these areas of deficits need to be restored to balance and function    See Therapeutic activity / neuromuscular re-education section  Answered pt's questions.   Person educated: Patient Education method: Explanation, Demonstration, Tactile cues, Verbal cues, and Handouts Education comprehension: verbalized understanding, returned demonstration, verbal cues required, tactile cues required, and needs further education     PLAN: PT FREQUENCY: 1x/week   PT DURATION: 10 weeks   PLANNED INTERVENTIONS: Therapeutic exercises, Therapeutic activity, Neuromuscular re-education, Balance training, Gait training, Patient/Family education, Self Care, Joint mobilization, Spinal mobilization, Moist heat, Taping, and Manual therapy, dry needling.   PLAN FOR NEXT SESSION: See clinical impression for plan     GOALS: Goals reviewed with patient? Yes  SHORT TERM GOALS: Target date: 04/03/2023    Pt will demo IND with HEP                    Baseline: Not IND            Goal status: INITIAL   LONG TERM GOALS: Target date: 05/15/2023   1.Pt will demo proper deep core coordination without chest breathing and optimal excursion of diaphragm/pelvic floor in order to promote spinal stability and pelvic floor function  Baseline: dyscoordination Goal status: INITIAL  2.  Pt will demo decreased abdominal  and perineal scar restrictions and demo proper lengthening of  fascial in theses areas Baseline: restricted  Goal status: INITIAL  3.  Pt will demo proper body mechanics in against gravity tasks and ADLs  work tasks, fitness  to minimize straining pelvic floor / back                  Baseline: not IND, improper form that places strain on pelvic floor                Goal status: INITIAL    4. Pt will demo levelled pelvic girdle and shoulder height in order to progress to deep core strengthening HEP and restore mobility at spine, pelvis, gait, posture   Baseline: R iliac crest higher  Goal status: INITIAL    5. Pt will demo increased gait speed > 1.0 m/s and reciporcal gait pattern with more arm swing in order to ambulate safely in community and return to fitness routine  Baseline: 0.7 m/s limited armswing, thoracic mobility, excessive L pelvis sway  Goal status: INITIAL  6. Pt will report decreased urinary and fecal leakage episodes by 50%   Baseline: Pt has 2 episodes of urinary leakage per day. Pt has fecal leakage a couple times before making it to the bathroom.  Goal: INITIAL       Mariane Masters, PT 03/07/2023, 4:26 PM

## 2023-03-07 ENCOUNTER — Other Ambulatory Visit: Payer: Self-pay | Admitting: *Deleted

## 2023-03-07 DIAGNOSIS — E041 Nontoxic single thyroid nodule: Secondary | ICD-10-CM

## 2023-03-09 ENCOUNTER — Other Ambulatory Visit: Payer: Self-pay

## 2023-03-19 ENCOUNTER — Encounter: Payer: Self-pay | Admitting: *Deleted

## 2023-03-19 DIAGNOSIS — C541 Malignant neoplasm of endometrium: Secondary | ICD-10-CM

## 2023-03-19 NOTE — Progress Notes (Signed)
Daily Session Note  Patient Details  Name: Jaime Johnson MRN: [id number] Date of Birth: 02-16-1961 Referring Provider:    Encounter Date: 03/19/2023  Check In:  Session Check In - 03/19/23 1216       Check-In   Supervising physician immediately available to respond to emergencies See telemetry face sheet for immediately available ER MD    Location [facility]    Staff Present Susann Givens, RN BSN;Jessica Juanetta Gosling, MA, RCEP, CCRP, CCET    Virtual Visit No    Medication changes reported     No    Fall or balance concerns reported    No    Tobacco Cessation No Change    Warm-up and Cool-down Performed as group-led instruction    Resistance Training Performed Yes   yoga   VAD Patient? No    PAD/SET Patient? No      Pain Assessment   Currently in Pain? No/denies                Social History   Tobacco Use  Smoking Status Former   Types: Cigarettes   Quit date: 10/22/1978   Years since quitting: 44.4  Smokeless Tobacco Never    Goals Met:  Independence with exercise equipment Exercise tolerated well No report of concerns or symptoms today Strength training completed today  Goals Unmet:  Not Applicable  Comments: Pt able to follow exercise prescription today without complaint.  Will continue to monitor for progression.    Dr. Bethann Punches is Medical Director for [facility].  Dr. Vida Rigger is Medical Director for [facility].

## 2023-03-21 ENCOUNTER — Encounter: Payer: Self-pay | Admitting: *Deleted

## 2023-03-21 DIAGNOSIS — C541 Malignant neoplasm of endometrium: Secondary | ICD-10-CM

## 2023-03-21 NOTE — Progress Notes (Signed)
Daily Session Note  Patient Details  Name: Jaime Johnson MRN: [id number] Date of Birth: [birth year] Referring Provider:    Encounter Date: 03/21/2023  Check In:  Session Check In - 03/21/23 1236       Check-In   Supervising physician immediately available to respond to emergencies See telemetry face sheet for immediately available ER MD    Location [facility]    Staff Present Susann Givens, RN BSN;Noah Tickle, BS, Exercise Physiologist    Virtual Visit No    Medication changes reported     No    Fall or balance concerns reported    No    Warm-up and Cool-down Performed on first and last piece of equipment    Resistance Training Performed Yes    VAD Patient? No    PAD/SET Patient? No                Social History   Tobacco Use  Smoking Status Former   Types: Cigarettes   Quit date: 10/22/1978   Years since quitting: 44.4  Smokeless Tobacco Never    Goals Met:  Independence with exercise equipment Exercise tolerated well No report of concerns or symptoms today Strength training completed today  Goals Unmet:  Not Applicable  Comments: Pt able to follow exercise prescription today without complaint.  Will continue to monitor for progression.    Dr. Bethann Punches is Medical Director for [facility] Cardiac Rehabilitation.  Dr. Vida Rigger is Medical Director for [facility] Pulmonary Rehabilitation.

## 2023-03-26 ENCOUNTER — Other Ambulatory Visit: Payer: Self-pay

## 2023-03-26 ENCOUNTER — Encounter: Payer: Self-pay | Attending: Hospice and Palliative Medicine | Admitting: *Deleted

## 2023-03-26 ENCOUNTER — Ambulatory Visit: Payer: Self-pay | Attending: Oncology | Admitting: Occupational Therapy

## 2023-03-26 DIAGNOSIS — I890 Lymphedema, not elsewhere classified: Secondary | ICD-10-CM | POA: Insufficient documentation

## 2023-03-26 DIAGNOSIS — R2689 Other abnormalities of gait and mobility: Secondary | ICD-10-CM | POA: Insufficient documentation

## 2023-03-26 DIAGNOSIS — C541 Malignant neoplasm of endometrium: Secondary | ICD-10-CM | POA: Insufficient documentation

## 2023-03-26 DIAGNOSIS — R278 Other lack of coordination: Secondary | ICD-10-CM | POA: Insufficient documentation

## 2023-03-26 DIAGNOSIS — M533 Sacrococcygeal disorders, not elsewhere classified: Secondary | ICD-10-CM | POA: Insufficient documentation

## 2023-03-26 DIAGNOSIS — M6281 Muscle weakness (generalized): Secondary | ICD-10-CM | POA: Insufficient documentation

## 2023-03-26 DIAGNOSIS — R2681 Unsteadiness on feet: Secondary | ICD-10-CM | POA: Insufficient documentation

## 2023-03-26 NOTE — Progress Notes (Signed)
Patient for US guided FNA thyroid nodule Biopsy on Wed 03/27/2023, I called and LVM for the patient on the phone and gave pre-procedure instructions. VM made aware to be here at 12:30p and check in at the [facility] registration desk. Called 03/26/2023

## 2023-03-26 NOTE — Progress Notes (Signed)
Daily Session Note  Patient Details  Name: JANAYA TRIOLA MRN: [id number] Date of Birth: 1961/04/07 Referring Provider:    Encounter Date: 03/26/2023  Check In:  Session Check In - 03/26/23 1256       Check-In   Supervising physician immediately available to respond to emergencies See telemetry face sheet for immediately available ER MD    Location [facility]    Staff Present Ronette Deter, BS, Exercise Physiologist;Akshath Mccarey [place], MA, RCEP, CCRP, CCET    Virtual Visit No    Medication changes reported     No    Fall or balance concerns reported    No    Warm-up and Cool-down Performed on first and last piece of equipment    Resistance Training Performed Yes    VAD Patient? No    PAD/SET Patient? No      Pain Assessment   Currently in Pain? No/denies                Social History   Tobacco Use  Smoking Status Former   Types: Cigarettes   Quit date: 10/22/1978   Years since quitting: 44.4  Smokeless Tobacco Never    Goals Met:  Proper associated with RPD/PD & O2 Sat Independence with exercise equipment Exercise tolerated well No report of concerns or symptoms today Strength training completed today  Goals Unmet:  Not Applicable  Comments: Pt able to follow exercise prescription today without complaint.  Will continue to monitor for progression.    Dr. Bethann Punches is Medical Director for [facility] Cardiac Rehabilitation.  Dr. Vida Rigger is Medical Director for [facility] Pulmonary Rehabilitation.

## 2023-03-27 ENCOUNTER — Ambulatory Visit
Admission: RE | Admit: 2023-03-27 | Discharge: 2023-03-27 | Disposition: A | Discharge status: Home / Self Care | Payer: Self-pay | Source: Ambulatory Visit | Attending: Oncology | Admitting: Oncology

## 2023-03-27 ENCOUNTER — Encounter: Payer: Self-pay | Admitting: Occupational Therapy

## 2023-03-27 DIAGNOSIS — E041 Nontoxic single thyroid nodule: Secondary | ICD-10-CM | POA: Insufficient documentation

## 2023-03-27 MED ORDER — LIDOCAINE HCL (PF) 1 % IJ SOLN
10.0000 mL | Freq: Once | INTRAMUSCULAR | Status: AC
  Administered 2023-03-27: 10 mL via INTRADERMAL
  Filled 2023-03-27: qty 10

## 2023-03-27 NOTE — Therapy (Signed)
OUTPATIENT OCCUPATIONALTHERAPY EVALUATION LOWER EXTREMITY LYMPHEDEMA   Patient Name: Jaime Johnson MRN: [id number] DOB:Apr 18, 1961, 62 y.o., female Today's Date: 03/27/2023  END OF SESSION:   OT End of Session - 03/27/23 0801     Number of Visits 36    Date for OT Re-Evaluation 05/23/23    OT Start Time 0210    OT Stop Time 0310    OT Time Calculation (min) 60 min    Activity Tolerance Patient tolerated treatment well;No increased pain    Behavior During Therapy [facility] for tasks assessed/performed              Past Medical History:  Diagnosis Date   Allergic rhinitis due to pollen    Endometrial cancer (HCC)    Hyperlipidemia LDL goal <100 02/16/2020   Hypertension 12/19/2016   Past Surgical History:  Procedure Laterality Date   CESAREAN SECTION     COLONOSCOPY WITH PROPOFOL N/A 11/12/2016   Procedure: COLONOSCOPY WITH PROPOFOL;  Surgeon: Midge Minium, MD;  Location: [facility];  Service: Endoscopy;  Laterality: N/A;   COLONOSCOPY WITH PROPOFOL N/A 03/29/2017   Procedure: COLONOSCOPY WITH PROPOFOL;  Surgeon: Midge Minium, MD;  Location: [facility];  Service: Endoscopy;  Laterality: N/A;   COLONOSCOPY WITH PROPOFOL N/A 03/14/2020   Procedure: COLONOSCOPY WITH PROPOFOL;  Surgeon: Midge Minium, MD;  Location: [facility];  Service: Endoscopy;  Laterality: N/A;  priority 4   IR IMAGING GUIDED PORT INSERTION  06/01/2022   IR REMOVAL TUN ACCESS W/ PORT W/O FL MOD SED  01/17/2023   POLYPECTOMY  11/12/2016   Procedure: POLYPECTOMY;  Surgeon: Midge Minium, MD;  Location: [facility];  Service: Endoscopy;;   POLYPECTOMY  03/29/2017   Procedure: POLYPECTOMY;  Surgeon: Midge Minium, MD;  Location: [facility];  Service: Endoscopy;;   Patient Active Problem List   Diagnosis Date Noted   Colon cancer (HCC) 05/22/2022   Endometrial carcinoma (HCC) 03/09/2022   Foraminal stenosis of cervical region (RIGHT) 03/16/2020   Hyperlipidemia LDL goal <100  02/16/2020   Cervical radicular pain (RIGHT) 09/29/2019   Bilateral carpal tunnel syndrome 09/29/2019   Essential hypertension 12/19/2016   Allergic rhinitis due to pollen     PCP: Kerin Perna, MD  REFERRING PROVIDER: Owens Shark, MD  REFERRING DIAG: I89.0  THERAPY DIAG:  Lymphedema, not elsewhere classified  Rationale for Evaluation and Treatment: Rehabilitation  ONSET DATE: ~10/23  SUBJECTIVE:  SUBJECTIVE STATEMENT: Jaime Johnson returns to OT today for initial LLE lymphedema treatment. Pt reports neuropathic pain on the tops of her feet at 4/10 severity.   PERTINENT HISTORY: Endometrial cancer s/p chemo and radiation; RA, Hx colon cancer, HTN, foraminal stenosis of cervical region,  B CTS,   PAIN:  Are you having pain? Yes: NPRS scale: 4/10 Pain location: legs (below knees)  and feet, L>R Pain description: heaviness, tightness, numbness Aggravating factors: standing, walking, dependent sitting, hot weather Relieving factors: elevation overnight  PRECAUTIONS: Other: LYMPHEDEMA PRECAUTIONS  FALLS:  Has patient fallen since last visit? No  HAND DOMINANCE: right   PRIOR LEVEL OF FUNCTION: Independent  PATIENT GOALS: reduce swelling and keep it from getting worse   OBJECTIVE: FOTO functional outcome score:  02/22/23 Intake: 53%  LYMPHEDEMA LIFE IMPACT SCALE (LLIS): 02/22/23 Intake:   50%  OBSERVATIONS / OTHER ASSESSMENTS:   BLE COMPARATIVE LIMB VOLUMETRICS  Initial:03/27/23  LANDMARK RIGHT  (Dominant)  R LEG (A-D) 2536.8 ml  R THIGH (E-G) 3597.1 ml  R FULL LIMB (A-G) 6133.8 ml  Limb Volume differential (LVD)  %  Volume change since initial %  Volume change overall V  (Blank rows = not tested)  LANDMARK LEFT  (RX)  L LEG (A-D) 2701.3 ml  L THIGH (E-G) 3620.1 ml  L  FULL  LIMB (A-G) 6321.4 ml  Limb Volume differential (LVD)  Leg LVD= 6.1%, L>R. Thigh LVD measures 0.63%, L>R, and full limb LVD = 2.97%, L>R.  Volume change since initial %  Volume change overall %  (Blank rows = not tested)    LYMPHEDEMA ASSESSMENTS:   LYMPHEDEMA LIFE IMPACT SCALE (LLIS): 50/100  FOTO Functional Outcome score: 53/100   TODAY'S TREATMENT:                                                                                                                                         BLE comparative limb volumetrics  PATIENT EDUCATION:  Education details: Continued Pt/ CG edu for lymphedema self care home program throughout session. Topics include outcome of comparative limb volumetrics- starting limb volume differentials (LVDs), technology and gradient techniques used for short stretch, multilayer compression wrapping, simple self-MLD, therapeutic lymphatic pumping exercises, skin/nail care, LE precautions,. compression garment recommendations and specifications, wear and care schedule and compression garment donning / doffing w assistive devices. Discussed progress towards all OT goals since commencing CDT. All questions answered to the Pt's satisfaction. Good return. Person educated: Patient Education method: Explanation, Demonstration, and Handouts Education comprehension: verbalized understanding, returned demonstration, and needs further education  LE self care HOME PROGRAM: BLE/BLQ lymphatic pumping there ex- 1 set of 10 each element, in order. Hold 5. 2 x daily 2. Daily compression-  appropriate compression garments for daytime use. (off the shelf or medical grade TBD) 3. Daily skin care with low ph lotion matching skin ph 4. Daily simple self MLD  ASSESSMENT:   CLINICAL IMPRESSION: Initial BLE limb volumetrics reveal that limb volume differentials are greater in the L Leg than the dominant RLE by 6.1%, greater in the L Thigh, by 0.63%, and greater in the full LLE  by 2.97%.  Typically LVD is around 2-3 % in favor of the dominant limb due to muscle mass. In this case limb volume is concentrated below the knee to the foot. We were unable to apply compression wrap to L leg today as planned due to time constraints. We'll commence wrapping and teaching self-wrapping next session. Cont as per POC.  REHAB POTENTIAL/PROGNOSIS: Negative factors affecting outcome include chronicity and length of time symptoms have been present. Positive prognosticators include motivation and medical stability. Prognosis for reducing swelling and limiting LE progression is good with regular attendance and high level of ongoing compliance with LE self-care home program.    OBJECTIVE IMPAIRMENTS: decreased standing, walking and dependent sitting tolerance, deconditioning, decreased balance, limited knowledge of lymphedema etiology,  progression and precautions, decreased knowledge of use of DME and compression, chronic nature of leg swelling and associated pain,  impaired sensation (neuropathy), postural dysfunction.   ACTIVITY LIMITATIONS: decreased basic ADLs (impaired self care performance, difficulty fitting shoes and LB clothing, LE limits sleep), limited IADLs performance ( shopping, cooking,cleaning, travel, driving), impaired productive activities (unable to perform work duties), impaired leisure pursuits ( gardening, walking), decreased social participation, decreased body image   EVALUATION COMPLEXITY: Moderate     GOALS: Goals reviewed with patient? Yes   SHORT TERM GOALS: Target date: 4th OT Rx visit    Pt will demonstrate understanding of lymphedema precautions and prevention strategies with modified independence using a printed reference to identify at least 5 precautions and discussing how s/he may implement them into daily life to reduce risk of progression with modified assistance Baseline: MAX A Goal status: INITIAL   LONG TERM GOALS: Target date: 05/23/23 (12 WEEKS)   Given  this patient's Intake score of 50 % on the Lymphedema Life Impact Scale (LLIS), patient will experience a reduction of at least 5% in level of impairment resulting from lymphedema to improve functional performance and quality of life (QOL). Baseline: 50% (the extent to which lymphedema-related problems interfered with your life last week.) Goal status: INITIAL   2.  Given this patient's Intake score of 53 % on the functional outcomes FOTO tool, patient will experience an increase in function of 5 points to improve basic and instrumental ADLs performance, including lymphedema self-care. (TBA at first OT Rx visit) Baseline: 54% Goal status: INITIAL   3.  With modified independence (extra time and assistive devices) Pt will be able to don and doff appropriate compression garments and/or devices to control BLE lymphedema and to limit progression.  Baseline: Max A Goal status: INITIAL   4. Pt will achieve at least a 5% volume reductions bilaterally below the knees to return limb to more typical size and shape, to limit infection risk and LE progression, to decrease pain, to improve function, and to improve body image and QOL. Baseline: Dependent Goal status: INITIAL   5. Pt will achieve and sustain at least 85% compliance with all LE self-care home program components throughout CDT, including modified simple self-MLD, daily skin care and inspection, lymphatic pumping the ex and appropriate compression to limit lymphedema progression and to limit further functional decline. Baseline: Dependent Goal status: INITIAL  PLAN:   PT FREQUENCY: 1-2 x weekly and PRN   PT DURATION: 12 weeks and PRN  PLANNED INTERVENTIONS: Therapeutic exercises (lymphatic pumping),  Therapeutic activity, Patient/Family education for LE Self Care, Compression fitting, instructions and assessment, MLD,  skin care to limit infection risk and increase excursion   PLAN FOR NEXT SESSION:  Bandaging L Leg using gradient  techniques Garment measurements given enough time LLE MLD Pt edu for LE self-care  Loel Dubonnet, MS, OTR/L, CLT-LANA 03/27/23 10:11 AM

## 2023-03-27 NOTE — Procedures (Addendum)
Patient would like it documented in procedure note that she is self-pay.  PROCEDURE SUMMARY:  Using direct ultrasound guidance, 5 passes were made using 25 g needles into the nodule within the right inferior lobe of the thyroid.   Ultrasound was used to confirm needle placements on all occasions.   EBL = trace  Specimens were sent to Pathology for analysis.  See procedure note under Imaging tab in Epic for full procedure details.  Kennieth Francois PA-C 03/27/2023 2:04 PM

## 2023-03-27 NOTE — Therapy (Deleted)
OUTPATIENT OCCUPATIONAL THERAPY TREATMENT NOTE LOWER EXTREMITY LYMPHEDEMA   Patient Name: Jaime Johnson MRN: [id number] DOB:June 12, 1961, 62 y.o., female Today's Date: 03/27/2023  END OF SESSION:   OT End of Session - 03/27/23 0801     Number of Visits 36    Date for OT Re-Evaluation 05/23/23    OT Start Time 0210    OT Stop Time 0310    OT Time Calculation (min) 60 min    Activity Tolerance Patient tolerated treatment well;No increased pain    Behavior During Therapy [facility] for tasks assessed/performed               OT End of Session - 03/27/23 0801     Number of Visits 36    Date for OT Re-Evaluation 05/23/23    OT Start Time 0210    OT Stop Time 0310    OT Time Calculation (min) 60 min    Activity Tolerance Patient tolerated treatment well;No increased pain    Behavior During Therapy [facility] for tasks assessed/performed              Past Medical History:  Diagnosis Date   Allergic rhinitis due to pollen    Endometrial cancer (HCC)    Hyperlipidemia LDL goal <100 02/16/2020   Hypertension 12/19/2016   Past Surgical History:  Procedure Laterality Date   CESAREAN SECTION     COLONOSCOPY WITH PROPOFOL N/A 11/12/2016   Procedure: COLONOSCOPY WITH PROPOFOL;  Surgeon: Midge Minium, MD;  Location: [facility];  Service: Endoscopy;  Laterality: N/A;   COLONOSCOPY WITH PROPOFOL N/A 03/29/2017   Procedure: COLONOSCOPY WITH PROPOFOL;  Surgeon: Midge Minium, MD;  Location: [facility];  Service: Endoscopy;  Laterality: N/A;   COLONOSCOPY WITH PROPOFOL N/A 03/14/2020   Procedure: COLONOSCOPY WITH PROPOFOL;  Surgeon: Midge Minium, MD;  Location: [facility];  Service: Endoscopy;  Laterality: N/A;  priority 4   IR IMAGING GUIDED PORT INSERTION  06/01/2022   IR REMOVAL TUN ACCESS W/ PORT W/O FL MOD SED  01/17/2023   POLYPECTOMY  11/12/2016   Procedure: POLYPECTOMY;  Surgeon: Midge Minium, MD;  Location: [facility];  Service: Endoscopy;;   POLYPECTOMY   03/29/2017   Procedure: POLYPECTOMY;  Surgeon: Midge Minium, MD;  Location: [facility];  Service: Endoscopy;;   Patient Active Problem List   Diagnosis Date Noted   Colon cancer (HCC) 05/22/2022   Endometrial carcinoma (HCC) 03/09/2022   Foraminal stenosis of cervical region (RIGHT) 03/16/2020   Hyperlipidemia LDL goal <100 02/16/2020   Cervical radicular pain (RIGHT) 09/29/2019   Bilateral carpal tunnel syndrome 09/29/2019   Essential hypertension 12/19/2016   Allergic rhinitis due to pollen     PCP: Kerin Perna, MD  REFERRING PROVIDER: Owens Shark, MD  REFERRING DIAG: I89.0  THERAPY DIAG:  Lymphedema, not elsewhere classified  Rationale for Evaluation and Treatment: Rehabilitation  ONSET DATE: ~10/23  SUBJECTIVE:  SUBJECTIVE STATEMENT: KIONI FRANKLYN is referred by Creig Hines, MD, for evaluation and treatment of BLE lymphedema w onset after 2nd round of chemotherapy to treat endometrial cancer. Jaime Johnson reports her cancer was initially classified as stage 1 but after lymph node testing they found evidence in the lymph channels and reclassified her to stage 3. She is unsure how many lymph nodes, if any, were removed. Review of the medical record indicates 0/3 internal and external iliac LN involved. Jaime Johnson is s/p 6 cycles of carbo-taxol completed 10/08/22 and vaginal brachytherapy completed 10/29/22. She reports leg swelling  bilaterally at 4/10, L>R, w numbness and pain in her toes and feet. Compression leggings have helped a little, but compression stockings bunch at her ankles and cut in at the top of her leg causing deep indentations and pain. Pt reports leg pain and swelling never goes away. She gets relief from elevation in the recliner she sleeps in and reports symptoms are  better in the morning. Walking , standing and summer heat makes symptoms worse.   PERTINENT HISTORY: Endometrial cancer s/p chemo and radiation; RA, Hx colon cancer, HTN, foraminal stenosis of cervical region,  B CTS,   PAIN:  Are you having pain? Yes: NPRS scale: 4/10 Pain location: legs (below knees)  and feet, L>R Pain description: heaviness, tightness, numbness Aggravating factors: standing, walking, dependent sitting, hot weather Relieving factors: elevation overnight  PRECAUTIONS: Other: LYMPHEDEMA PRECAUTIONS  WEIGHT BEARING RESTRICTIONS: No  FALLS:  Has patient fallen in last 6 months? No  LIVING ENVIRONMENT: Lives with: spouse Lives in: House/apartment Stairs: Single story with 1 step to enter Has following equipment at home: shower chair and Grab bars  OCCUPATION: [employer] prior to 2020; currently unemployed  LEISURE: gardening, walking  HAND DOMINANCE: right   PRIOR LEVEL OF FUNCTION: Independent  PATIENT GOALS: reduce swelling and keep it from getting worse   OBJECTIVE:  COGNITION:  Overall cognitive status: Within functional limits for tasks assessed   FOTO functional outcome score:  02/22/23 Intake: 53%  LYMPHEDEMA LIFE IMPACT SCALE (LLIS): 02/22/23 Intake:   50%  OBSERVATIONS / OTHER ASSESSMENTS:   Mild, Stage  I, Bilateral Lower Extremity, Cancer-related,  Lymphedema 2/2 endometrial cancer treatment. Also suspect venous insufficiency as contributing factor.  Skin  Description Hyper-Keratosis Peau' de Orange Shiny Tight Fibrotic/ Indurated Fatty Doughy Spongy/ boggy          X below the knees bilaterally   Skin dry Flaky WNL Macerated   mildly      Color Redness Varicosities Blanching Hemosiderin Stain Mottled    x   X B feet and distal legs    Odor Malodorous Yeast Fungal infection  WNL      x   Temperature Warm Cool wnl    x     Pitting Edema   1+ 2+ 3+ 4+ Non-pitting         x   Girth Symmetrical Asymmetrical                    Distribution   TBA  Toes to popliteal fossa    Stemmer Sign Positive Negative    x   Lymphorrhea History Of:  Present Absent     x    Wounds History Of Present Absent Venous Arterial Pressure Non-healing     x        Signs of Infection Absent Warmth Erythema Acute Swelling Drainage Borders   x  Sensation Light Touch Deep pressure Hypersensitivity Neuropathy   In Tact Impaired In Tact Impaired Absent Impaired    x  x  x  Chemo-induced x    Nails WNL   Fungus nail dystrophy   x     Hair Growth Symmetrical Asymmetrical   WNL    Skin Creases Base of toes  Ankles   Base of Fingers knees       Abdominal pannus Lobules  Face/neck             BLE COMPARATIVE LIMB VOLUMETRICS  Initial: TBA OT Rx visit 1  LANDMARK RIGHT    R LEG (A-D) N/A  R THIGH (E-G) ml  R FULL LIMB (A-G) ml  Limb Volume differential (LVD)  %  Volume change since initial %  Volume change overall V  (Blank rows = not tested)  LANDMARK LEFT   R LEG (A-D) N/A  R THIGH (E-G) ml  R FULL LIMB (A-G) ml  Limb Volume differential (LVD)  %  Volume change since initial %  Volume change overall %  (Blank rows = not tested)   POSTURE: head forward  LE ROM: WFL  LE MMT: WFL, easily fatigued  LYMPHEDEMA ASSESSMENTS:   LYMPHEDEMA LIFE IMPACT SCALE (LLIS):   TODAY'S TREATMENT:                                                                                                                                         OT EVALUATION PATIENT EDUCATION:  Education details: Provided Pt/ family education regarding lymphatic structure and function, lymphedema etiology, onset patterns and stages of progression. Taught interaction between blood circulatory system and lymphatic circulation.Discussed  impact of gravity and co-morbidities on lymphatic function. Outlined Complete Decongestive Therapy (CDT)  as standard of care and provided in depth information regarding 4 primary  components of Intensive and Self Management Phases, including Manual Lymph Drainage (MLD), compression wrapping and garments, skin care, and therapeutic exercise. Homero Fellers discussion with re need for frequent attendance and high burden of care when caregiver is needed, impact of co morbidities. We discussed  the chronic, progressive nature of lymphedema and Importance of daily, ongoing LE self-care essential for limiting progression and infection risk. Discussed the demanding nature of CDT, high burden of care between visits and throughout self-management phase,  and importance of high level of compliance with all home program components for optimal outcome.  Person educated: Patient Education method: Explanation, Demonstration, and Handouts Education comprehension: verbalized understanding, returned demonstration, and needs further education  LE self care HOME PROGRAM: BLE/BLQ lymphatic pumping there ex- 1 set of 10 each element, in order. Hold 5. 2 x daily 2. Daily compression-  appropriate compression garments for daytime use. (off the shelf or medical grade TBD) 3. Daily skin care with low ph lotion matching skin ph 4. Daily simple self MLD    ASSESSMENT:   CLINICAL IMPRESSION: Jaime Johnson  Agustin Johnson presents with mild, stage I, BLE lymphedema 2/2 surgical intervention and radiation therapy for endometrial cancer. In keeping with stage I designation, swelling fluctuates, worsens and gets better, comes and goes, but at present does not fully resolve and worsens as the day progresses, especially when legs are dependent. Some mild, "spongy" fibrosis is palpable below the knees and at distal ankles. Since Pt presents with several symptoms that are also in keeping with venous insufficiency, including skin discoloration (hemosiderin stain?), swelling in legs and ankles improves with elevation, pain with walking improves with rest, and obvious varicose veins venous insufficiency is also a likely contributing  condition. Pt will benefit from a Duplex Ultrasound to explore possible contribution to limb swelling, and to limit risk of DVT.  Despite the early stage I presentation,  chromic leg swelling and associated pain limits Jaime Johnson functional performance in all occupational domains, including basic and instrumental ADLs, productive activities and leisure pursuits and social participation. Pt also expresses feelings of frustration due to chronic leg swelling and associated pain. Jaime Johnson will benefit from skilled Occupational Therapy to reduce and control limb swelling and to limit lymphedema progression. Pt will  benefit from modified Intensive Phase CDT,  which typically includes multilayer compression bandaging, skin care, manual lymphatic drainage (MLD) and therapeutic exercise.  Because limb swelling at present is very mild we'll focus on utilizing comfortable and effective compression garments rather than multilayer bandaging.Emphasis will be directed at teaching lall aspects of lymphedema self care. Without skilled OT for lymphedema care, the condition may progress resulting in further functional decline.   REHAB POTENTIAL/PROGNOSIS: Negative factors affecting outcome include chronicity and length of time symptoms have been present. Positive prognosticators include motivation and medical stability. Prognosis for reducing swelling and limiting LE progression is good with regular attendance and high level of ongoing compliance with LE self-care home program.    OBJECTIVE IMPAIRMENTS: decreased standing, walking and dependent sitting tolerance, deconditioning, decreased balance, limited knowledge of lymphedema etiology,  progression and precautions, decreased knowledge of use of DME and compression, chronic nature of leg swelling and associated pain,  impaired sensation (neuropathy), postural dysfunction.   ACTIVITY LIMITATIONS: decreased basic ADLs (impaired self care performance, difficulty fitting  shoes and LB clothing, LE limits sleep), limited IADLs performance ( shopping, cooking,cleaning, travel, driving), impaired productive activities (unable to perform work duties), impaired leisure pursuits ( gardening, walking), decreased social participation, decreased body image   EVALUATION COMPLEXITY: Moderate     GOALS: Goals reviewed with patient? Yes   SHORT TERM GOALS: Target date: 4th OT Rx visit    Pt will demonstrate understanding of lymphedema precautions and prevention strategies with modified independence using a printed reference to identify at least 5 precautions and discussing how s/he may implement them into daily life to reduce risk of progression with modified assistance Baseline: MAX A Goal status: INITIAL   LONG TERM GOALS: Target date: 05/23/23 (12 WEEKS)   Given this patient's Intake score of 50 % on the Lymphedema Life Impact Scale (LLIS), patient will experience a reduction of at least 5% in level of impairment resulting from lymphedema to improve functional performance and quality of life (QOL). Baseline: 50% (the extent to which lymphedema-related problems interfered with your life last week.) Goal status: INITIAL   2.  Given this patient's Intake score of 53 % on the functional outcomes FOTO tool, patient will experience an increase in function of 5 points to improve basic and instrumental ADLs performance, including lymphedema self-care. (TBA  at first OT Rx visit) Baseline: 54% Goal status: INITIAL   3.  With modified independence (extra time and assistive devices) Pt will be able to don and doff appropriate compression garments and/or devices to control BLE lymphedema and to limit progression.  Baseline: Max A Goal status: INITIAL   4. Pt will achieve at least a 5% volume reductions bilaterally below the knees to return limb to more typical size and shape, to limit infection risk and LE progression, to decrease pain, to improve function, and to improve body  image and QOL. Baseline: Dependent Goal status: INITIAL   5. Pt will achieve and sustain at least 85% compliance with all LE self-care home program components throughout CDT, including modified simple self-MLD, daily skin care and inspection, lymphatic pumping the ex and appropriate compression to limit lymphedema progression and to limit further functional decline. Baseline: Dependent Goal status: INITIAL  PLAN:   PT FREQUENCY: 1-2 x weekly and PRN   PT DURATION: 12 weeks and PRN   PLANNED INTERVENTIONS: Therapeutic exercises (lymphatic pumping),  Therapeutic activity, Patient/Family education for LE Self Care, Compression fitting, instructions and assessment, MLD,  skin care to limit infection risk and increase excursion   PLAN FOR NEXT SESSION:  Baseline comparative limb volumetrics Garment measurements given enough time Pt edu for LE self-care  Loel Dubonnet, Jaime, OTR/L, CLT-LANA 03/27/23 8:03 AM

## 2023-03-28 ENCOUNTER — Encounter: Payer: Self-pay | Admitting: *Deleted

## 2023-03-28 ENCOUNTER — Ambulatory Visit: Payer: Self-pay | Admitting: Occupational Therapy

## 2023-03-28 ENCOUNTER — Encounter: Payer: Self-pay | Admitting: Occupational Therapy

## 2023-03-28 DIAGNOSIS — I890 Lymphedema, not elsewhere classified: Secondary | ICD-10-CM

## 2023-03-28 DIAGNOSIS — C541 Malignant neoplasm of endometrium: Secondary | ICD-10-CM

## 2023-03-28 NOTE — Progress Notes (Signed)
Daily Session Note  Patient Details  Name: Jaime Johnson MRN: [id number] Date of Birth: 04/09/1961 Referring Provider:    Encounter Date: 03/28/2023  Check In:  Session Check In - 03/28/23 1225       Check-In   Supervising physician immediately available to respond to emergencies See telemetry face sheet for immediately available ER MD    Location [facility]    Staff Present Ronette Deter, BS, Exercise Physiologist;Corbet Hanley McIntosh, MA, RCEP, CCRP, CCET    Virtual Visit No    Medication changes reported     No    Fall or balance concerns reported    No    Tobacco Cessation No Change    Warm-up and Cool-down Performed on first and last piece of equipment    Resistance Training Performed Yes    VAD Patient? No    PAD/SET Patient? No      Pain Assessment   Currently in Pain? No/denies                Social History   Tobacco Use  Smoking Status Former   Types: Cigarettes   Quit date: 10/22/1978   Years since quitting: 44.4  Smokeless Tobacco Never    Goals Met:  Proper associated with RPD/PD & O2 Sat Independence with exercise equipment Exercise tolerated well No report of concerns or symptoms today Strength training completed today  Goals Unmet:  Not Applicable  Comments: Pt able to follow exercise prescription today without complaint.  Will continue to monitor for progression.    Dr. Bethann Punches is Medical Director for [facility].  Dr. Vida Rigger is Medical Director for [facility].

## 2023-03-29 NOTE — Therapy (Signed)
OUTPATIENT OCCUPATIONALTHERAPY TREATMENT NOTE LOWER EXTREMITY LYMPHEDEMA   Patient Name: Jaime Johnson MRN: [id number] DOB:May 28, 1961, 62 y.o., female Today's Date: 03/29/2023  END OF SESSION:   OT End of Session - 03/28/23 1413     Visit Number 3    Number of Visits 36    Date for OT Re-Evaluation 05/23/23    OT Start Time 0200    OT Stop Time 0310    OT Time Calculation (min) 70 min    Activity Tolerance Patient tolerated treatment well;No increased pain    Behavior During Therapy [facility] for tasks assessed/performed              Past Medical History:  Diagnosis Date   Allergic rhinitis due to pollen    Endometrial cancer (HCC)    Hyperlipidemia LDL goal <100 02/16/2020   Hypertension 12/19/2016   Past Surgical History:  Procedure Laterality Date   CESAREAN SECTION     COLONOSCOPY WITH PROPOFOL N/A 11/12/2016   Procedure: COLONOSCOPY WITH PROPOFOL;  Surgeon: Midge Minium, MD;  Location: [facility];  Service: Endoscopy;  Laterality: N/A;   COLONOSCOPY WITH PROPOFOL N/A 03/29/2017   Procedure: COLONOSCOPY WITH PROPOFOL;  Surgeon: Midge Minium, MD;  Location: [facility];  Service: Endoscopy;  Laterality: N/A;   COLONOSCOPY WITH PROPOFOL N/A 03/14/2020   Procedure: COLONOSCOPY WITH PROPOFOL;  Surgeon: Midge Minium, MD;  Location: [facility];  Service: Endoscopy;  Laterality: N/A;  priority 4   IR IMAGING GUIDED PORT INSERTION  06/01/2022   IR REMOVAL TUN ACCESS W/ PORT W/O FL MOD SED  01/17/2023   POLYPECTOMY  11/12/2016   Procedure: POLYPECTOMY;  Surgeon: Midge Minium, MD;  Location: [place] [facility];  Service: Endoscopy;;   POLYPECTOMY  03/29/2017   Procedure: POLYPECTOMY;  Surgeon: Midge Minium, MD;  Location: [facility];  Service: Endoscopy;;   Patient Active Problem List   Diagnosis Date Noted   Colon cancer (HCC) 05/22/2022   Endometrial carcinoma (HCC) 03/09/2022   Foraminal stenosis of cervical region (RIGHT) 03/16/2020    Hyperlipidemia LDL goal <100 02/16/2020   Cervical radicular pain (RIGHT) 09/29/2019   Bilateral carpal tunnel syndrome 09/29/2019   Essential hypertension 12/19/2016   Allergic rhinitis due to pollen     PCP: Kerin Perna, MD  REFERRING PROVIDER: Owens Shark, MD  REFERRING DIAG: I89.0  THERAPY DIAG:  Lymphedema, not elsewhere classified  Rationale for Evaluation and Treatment: Rehabilitation  ONSET DATE: ~10/23  SUBJECTIVE:  SUBJECTIVE STATEMENT: [name] returns to OT today for initial LLE lymphedema treatment. Pt reports neuropathic pain on the tops of her feet at 4/10 severity.   PERTINENT HISTORY: Endometrial cancer s/p chemo and radiation; RA, Hx colon cancer, HTN, foraminal stenosis of cervical region,  B CTS,   PAIN:  Are you having pain? Yes: NPRS scale: 4/10 Pain location: legs (below knees)  and feet, L>R Pain description: heaviness, tightness, numbness Aggravating factors: standing, walking, dependent sitting, hot weather Relieving factors: elevation overnight  PRECAUTIONS: Other: LYMPHEDEMA PRECAUTIONS  FALLS:  Has patient fallen since last visit? No  HAND DOMINANCE: right   PRIOR LEVEL OF FUNCTION: Independent  PATIENT GOALS: reduce swelling and keep it from getting worse   OBJECTIVE: FOTO functional outcome score:  02/22/23 Intake: 53%  LYMPHEDEMA LIFE IMPACT SCALE (LLIS): 02/22/23 Intake:   50%  OBSERVATIONS / OTHER ASSESSMENTS:   BLE COMPARATIVE LIMB VOLUMETRICS  Initial:03/27/23  LANDMARK RIGHT  (Dominant)  R LEG (A-D) 2536.8 ml  R THIGH (E-G) 3597.1 ml  R FULL LIMB (A-G) 6133.8 ml  Limb Volume differential (LVD)  %  Volume change since initial %  Volume change overall V  (Blank rows = not tested)  LANDMARK LEFT  (RX)  L LEG (A-D) 2701.3 ml  L THIGH  (E-G) 3620.1 ml  L  FULL LIMB (A-G) 6321.4 ml  Limb Volume differential (LVD)  Leg LVD= 6.1%, L>R. Thigh LVD measures 0.63%, L>R, and full limb LVD = 2.97%, L>R.  Volume change since initial %  Volume change overall %  (Blank rows = not tested)    LYMPHEDEMA ASSESSMENTS:   LYMPHEDEMA LIFE IMPACT SCALE (LLIS): 50/100  FOTO Functional Outcome score: 53/100   TODAY'S TREATMENT:                                                                                                                                         BLE comparative limb volumetrics  PATIENT EDUCATION:  Education details: Continued Pt/ CG edu for lymphedema self care home program throughout session. Topics include outcome of comparative limb volumetrics- starting limb volume differentials (LVDs), technology and gradient techniques used for short stretch, multilayer compression wrapping, simple self-MLD, therapeutic lymphatic pumping exercises, skin/nail care, LE precautions,. compression garment recommendations and specifications, wear and care schedule and compression garment donning / doffing w assistive devices. Discussed progress towards all OT goals since commencing CDT. All questions answered to the Pt's satisfaction. Good return. Person educated: Patient Education method: Explanation, Demonstration, and Handouts Education comprehension: verbalized understanding, returned demonstration, and needs further education  LE self care HOME PROGRAM: BLE/BLQ lymphatic pumping there ex- 1 set of 10 each element, in order. Hold 5. 2 x daily 2. Daily compression-  appropriate compression garments for daytime use. (off the shelf or medical grade TBD) 3. Daily skin care with low ph lotion matching skin ph 4. Daily simple self MLD  ASSESSMENT:   CLINICAL IMPRESSION: Commenced LLE/LLQ MLD today with good response. Pt tolerated manual therapy without pain. We've decided to forego compression bandaging for the present and are opting for  a lightweight garment initially for comfort. We'll check volumetrics  for visit 10 progress note and hopefully will see volume reduction comparable to unaffected leg.  Cont as per POC.  REHAB POTENTIAL/PROGNOSIS: Negative factors affecting outcome include chronicity and length of time symptoms have been present. Positive prognosticators include motivation and medical stability. Prognosis for reducing swelling and limiting LE progression is good with regular attendance and high level of ongoing compliance with LE self-care home program.    OBJECTIVE IMPAIRMENTS: decreased standing, walking and dependent sitting tolerance, deconditioning, decreased balance, limited knowledge of lymphedema etiology,  progression and precautions, decreased knowledge of use of DME and compression, chronic nature of leg swelling and associated pain,  impaired sensation (neuropathy), postural dysfunction.   ACTIVITY LIMITATIONS: decreased basic ADLs (impaired self care performance, difficulty fitting shoes and LB clothing, LE limits sleep), limited IADLs performance ( shopping, cooking,cleaning, travel, driving), impaired productive activities (unable to perform work duties), impaired leisure pursuits ( gardening, walking), decreased social participation, decreased body image   EVALUATION COMPLEXITY: Moderate     GOALS: Goals reviewed with patient? Yes   SHORT TERM GOALS: Target date: 4th OT Rx visit    Pt will demonstrate understanding of lymphedema precautions and prevention strategies with modified independence using a printed reference to identify at least 5 precautions and discussing how s/he may implement them into daily life to reduce risk of progression with modified assistance Baseline: MAX A Goal status: INITIAL   LONG TERM GOALS: Target date: 05/23/23 (12 WEEKS)   Given this patient's Intake score of 50 % on the Lymphedema Life Impact Scale (LLIS), patient will experience a reduction of at least 5% in level  of impairment resulting from lymphedema to improve functional performance and quality of life (QOL). Baseline: 50% (the extent to which lymphedema-related problems interfered with your life last week.) Goal status: INITIAL   2.  Given this patient's Intake score of 53 % on the functional outcomes FOTO tool, patient will experience an increase in function of 5 points to improve basic and instrumental ADLs performance, including lymphedema self-care. (TBA at first OT Rx visit) Baseline: 54% Goal status: INITIAL   3.  With modified independence (extra time and assistive devices) Pt will be able to don and doff appropriate compression garments and/or devices to control BLE lymphedema and to limit progression.  Baseline: Max A Goal status: INITIAL   4. Pt will achieve at least a 5% volume reductions bilaterally below the knees to return limb to more typical size and shape, to limit infection risk and LE progression, to decrease pain, to improve function, and to improve body image and QOL. Baseline: Dependent Goal status: INITIAL   5. Pt will achieve and sustain at least 85% compliance with all LE self-care home program components throughout CDT, including modified simple self-MLD, daily skin care and inspection, lymphatic pumping the ex and appropriate compression to limit lymphedema progression and to limit further functional decline. Baseline: Dependent Goal status: INITIAL  PLAN:   PT FREQUENCY: 1-2 x weekly and PRN   PT DURATION: 12 weeks and PRN   PLANNED INTERVENTIONS: Therapeutic exercises (lymphatic pumping),  Therapeutic activity, Patient/Family education for LE Self Care, Compression fitting, instructions and assessment, MLD,  skin care to limit infection risk and increase excursion   PLAN FOR NEXT SESSION:  Bandaging L Leg using gradient techniques Garment measurements given enough time LLE MLD Pt edu for LE self-care  Loel Dubonnet, MS, OTR/L, CLT-LANA 03/29/23 8:00 AM

## 2023-04-02 ENCOUNTER — Encounter: Payer: Self-pay | Admitting: Occupational Therapy

## 2023-04-02 ENCOUNTER — Ambulatory Visit: Payer: Self-pay | Admitting: Occupational Therapy

## 2023-04-02 ENCOUNTER — Encounter: Payer: Self-pay | Admitting: Oncology

## 2023-04-02 ENCOUNTER — Encounter: Payer: Self-pay | Admitting: Physical Therapy

## 2023-04-02 DIAGNOSIS — C541 Malignant neoplasm of endometrium: Secondary | ICD-10-CM

## 2023-04-02 DIAGNOSIS — I890 Lymphedema, not elsewhere classified: Secondary | ICD-10-CM

## 2023-04-02 NOTE — Progress Notes (Signed)
Daily Session Note  Patient Details  Name: Jaime Johnson MRN: [id number] Date of Birth: [birth year] Referring Provider:    Encounter Date: 04/02/2023  Check In:  Session Check In - 04/02/23 1238       Check-In   Supervising physician immediately available to respond to emergencies See telemetry face sheet for immediately available ER MD    Location [facility]    Staff Present Fabio Pierce, MA, RCEP, CCRP, CCET;Cordero Surette [place], [place]    Virtual Visit No    Medication changes reported     No    Fall or balance concerns reported    No    Warm-up and Cool-down Not performed (comment)    Resistance Training Performed Yes    VAD Patient? No      Pain Assessment   Currently in Pain? No/denies    Multiple Pain Sites No                Social History   Tobacco Use  Smoking Status Former   Types: Cigarettes   Quit date: 10/22/1978   Years since quitting: 44.4  Smokeless Tobacco Never    Goals Met:  Proper associated with RPD/PD & O2 Sat Independence with exercise equipment Exercise tolerated well No report of concerns or symptoms today Strength training completed today  Goals Unmet:  Not Applicable  Comments: Pt able to follow exercise prescription today without complaint.  Will continue to monitor for progression.    Dr. Bethann Punches is Medical Director for [facility] Cardiac Rehabilitation.  Dr. Vida Rigger is Medical Director for [facility] Pulmonary Rehabilitation.

## 2023-04-02 NOTE — Therapy (Signed)
OUTPATIENT OCCUPATIONALTHERAPY TREATMENT NOTE LOWER EXTREMITY LYMPHEDEMA   Patient Name: Jaime Johnson MRN: [id number] DOB:08/23/1961, 62 y.o., female Today's Date: 04/02/2023  END OF SESSION:   OT End of Session - 04/02/23 1416     Visit Number 4    Number of Visits 36    Date for OT Re-Evaluation 05/23/23    OT Start Time 0210    OT Stop Time 0315    OT Time Calculation (min) 65 min    Activity Tolerance Patient tolerated treatment well;No increased pain    Behavior During Therapy [facility] for tasks assessed/performed              Past Medical History:  Diagnosis Date   Allergic rhinitis due to pollen    Endometrial cancer (HCC)    Hyperlipidemia LDL goal <100 02/16/2020   Hypertension 12/19/2016   Past Surgical History:  Procedure Laterality Date   CESAREAN SECTION     COLONOSCOPY WITH PROPOFOL N/A 11/12/2016   Procedure: COLONOSCOPY WITH PROPOFOL;  Surgeon: Midge Minium, MD;  Location: [facility];  Service: Endoscopy;  Laterality: N/A;   COLONOSCOPY WITH PROPOFOL N/A 03/29/2017   Procedure: COLONOSCOPY WITH PROPOFOL;  Surgeon: Midge Minium, MD;  Location: [facility];  Service: Endoscopy;  Laterality: N/A;   COLONOSCOPY WITH PROPOFOL N/A 03/14/2020   Procedure: COLONOSCOPY WITH PROPOFOL;  Surgeon: Midge Minium, MD;  Location: [facility];  Service: Endoscopy;  Laterality: N/A;  priority 4   IR IMAGING GUIDED PORT INSERTION  06/01/2022   IR REMOVAL TUN ACCESS W/ PORT W/O FL MOD SED  01/17/2023   POLYPECTOMY  11/12/2016   Procedure: POLYPECTOMY;  Surgeon: Midge Minium, MD;  Location: [facility];  Service: Endoscopy;;   POLYPECTOMY  03/29/2017   Procedure: POLYPECTOMY;  Surgeon: Midge Minium, MD;  Location: [facility];  Service: Endoscopy;;   Patient Active Problem List   Diagnosis Date Noted   Colon cancer (HCC) 05/22/2022   Endometrial carcinoma (HCC) 03/09/2022   Foraminal stenosis of cervical region (RIGHT) 03/16/2020    Hyperlipidemia LDL goal <100 02/16/2020   Cervical radicular pain (RIGHT) 09/29/2019   Bilateral carpal tunnel syndrome 09/29/2019   Essential hypertension 12/19/2016   Allergic rhinitis due to pollen     PCP: Jaime Perna, MD  REFERRING PROVIDER: Owens Shark, MD  REFERRING DIAG: I89.0  THERAPY DIAG:  Lymphedema, not elsewhere classified  Rationale for Evaluation and Treatment: Rehabilitation  ONSET DATE: ~10/23  SUBJECTIVE:  SUBJECTIVE STATEMENT: Jaime Johnson returns to OT today for initial LLE lymphedema treatment. Pt reports neuropathic pain on the tops of her feet are unchanged. Pt requests measurements for compression garments today.   PERTINENT HISTORY: Endometrial cancer s/p chemo and radiation; RA, Hx colon cancer, HTN, foraminal stenosis of cervical region,  B CTS,   PAIN:  Are you having pain? Yes: NPRS scale: not rated numerically/10 Pain location: legs (below knees)  and feet, L>R Pain description: heaviness, tightness, numbness Aggravating factors: standing, walking, dependent sitting, hot weather Relieving factors: elevation overnight  PRECAUTIONS: Other: LYMPHEDEMA PRECAUTIONS  FALLS:  Has patient fallen since last visit? No  HAND DOMINANCE: right   PRIOR LEVEL OF FUNCTION: Independent  PATIENT GOALS: reduce swelling and keep it from getting worse   OBJECTIVE: FOTO functional outcome score:  02/22/23 Intake: 53%  LYMPHEDEMA LIFE IMPACT SCALE (LLIS): 02/22/23 Intake:   50%  OBSERVATIONS / OTHER ASSESSMENTS:   BLE COMPARATIVE LIMB VOLUMETRICS  Initial:03/27/23  LANDMARK RIGHT  (Dominant)  R LEG (A-D) 2536.8 ml  R THIGH (E-G) 3597.1 ml  R FULL LIMB (A-G) 6133.8 ml  Limb Volume differential (LVD)  %  Volume change since initial %  Volume change overall V  (Blank  rows = not tested)  LANDMARK LEFT  (RX)  L LEG (A-D) 2701.3 ml  L THIGH (E-G) 3620.1 ml  L  FULL LIMB (A-G) 6321.4 ml  Limb Volume differential (LVD)  Leg LVD= 6.1%, L>R. Thigh LVD measures 0.63%, L>R, and full limb LVD = 2.97%, L>R.  Volume change since initial %  Volume change overall %  (Blank rows = not tested)    LYMPHEDEMA ASSESSMENTS:   LYMPHEDEMA LIFE IMPACT SCALE (LLIS): 50/100  FOTO Functional Outcome score: 53/100   TODAY'S TREATMENT:                                                                                                                                         Anatomical measurements for OTS compression garments for LLE Multilayer gradient compression wraps to LLE-knee length  PATIENT EDUCATION:  Education details: Continued Pt/ CG edu for lymphedema self care home program throughout session. Topics include outcome of comparative limb volumetrics- starting limb volume differentials (LVDs), technology and gradient techniques used for short stretch, multilayer compression wrapping, simple self-MLD, therapeutic lymphatic pumping exercises, skin/nail care, LE precautions,. compression garment recommendations and specifications, wear and care schedule and compression garment donning / doffing w assistive devices. Discussed progress towards all OT goals since commencing CDT. All questions answered to the Pt's satisfaction. Good return. Person educated: Patient Education method: Explanation, Demonstration, and Handouts Education comprehension: verbalized understanding, returned demonstration, and needs further education  LE self care HOME PROGRAM: BLE/BLQ lymphatic pumping there ex- 1 set of 10 each element, in order. Hold 5. 2 x daily 2. Daily compression-  appropriate compression garments for daytime use. (off the shelf or medical grade  TBD) 3. Daily skin care with low ph lotion matching skin ph 4. Daily simple self MLD    ASSESSMENT:   CLINICAL  IMPRESSION: Anatomical measurements for recommended Juzo SOFT, knee high compression stockings completed and Pt will purchase at one of several recommended online vendors. Pt requires JUZO SOFT, size II PETITE length, open toe w silicone to band- ccl 1 ( 20-30 mmHg) Briefly provided LLE/LLQ MLD today with good response. Applied 2 short stretch bandages over single layer of Rosidal soft and cotton stockinet  from base of toes to popliteal fossa in effort to check Pt's tolerance for compression. Cont as per POC.  REHAB POTENTIAL/PROGNOSIS: Negative factors affecting outcome include chronicity and length of time symptoms have been present. Positive prognosticators include motivation and medical stability. Prognosis for reducing swelling and limiting LE progression is good with regular attendance and high level of ongoing compliance with LE self-care home program.    OBJECTIVE IMPAIRMENTS: decreased standing, walking and dependent sitting tolerance, deconditioning, decreased balance, limited knowledge of lymphedema etiology,  progression and precautions, decreased knowledge of use of DME and compression, chronic nature of leg swelling and associated pain,  impaired sensation (neuropathy), postural dysfunction.   ACTIVITY LIMITATIONS: decreased basic ADLs (impaired self care performance, difficulty fitting shoes and LB clothing, LE limits sleep), limited IADLs performance ( shopping, cooking,cleaning, travel, driving), impaired productive activities (unable to perform work duties), impaired leisure pursuits ( gardening, walking), decreased social participation, decreased body image   EVALUATION COMPLEXITY: Moderate     GOALS: Goals reviewed with patient? Yes   SHORT TERM GOALS: Target date: 4th OT Rx visit    Pt will demonstrate understanding of lymphedema precautions and prevention strategies with modified independence using a printed reference to identify at least 5 precautions and discussing how s/he  may implement them into daily life to reduce risk of progression with modified assistance Baseline: MAX A Goal status: INITIAL   LONG TERM GOALS: Target date: 05/23/23 (12 WEEKS)   Given this patient's Intake score of 50 % on the Lymphedema Life Impact Scale (LLIS), patient will experience a reduction of at least 5% in level of impairment resulting from lymphedema to improve functional performance and quality of life (QOL). Baseline: 50% (the extent to which lymphedema-related problems interfered with your life last week.) Goal status: INITIAL   2.  Given this patient's Intake score of 53 % on the functional outcomes FOTO tool, patient will experience an increase in function of 5 points to improve basic and instrumental ADLs performance, including lymphedema self-care. (TBA at first OT Rx visit) Baseline: 54% Goal status: INITIAL   3.  With modified independence (extra time and assistive devices) Pt will be able to don and doff appropriate compression garments and/or devices to control BLE lymphedema and to limit progression.  Baseline: Max A Goal status: INITIAL   4. Pt will achieve at least a 5% volume reductions bilaterally below the knees to return limb to more typical size and shape, to limit infection risk and LE progression, to decrease pain, to improve function, and to improve body image and QOL. Baseline: Dependent Goal status: INITIAL   5. Pt will achieve and sustain at least 85% compliance with all LE self-care home program components throughout CDT, including modified simple self-MLD, daily skin care and inspection, lymphatic pumping the ex and appropriate compression to limit lymphedema progression and to limit further functional decline. Baseline: Dependent Goal status: INITIAL  PLAN:   PT FREQUENCY: 1-2 x weekly and PRN  PT DURATION: 12 weeks and PRN   PLANNED INTERVENTIONS: Therapeutic exercises (lymphatic pumping),  Therapeutic activity, Patient/Family education for LE  Self Care, Compression fitting, instructions and assessment, MLD,  skin care to limit infection risk and increase excursion   PLAN FOR NEXT SESSION:  Bandaging L Leg using gradient techniques Garment measurements given enough time LLE MLD Pt edu for LE self-care  Loel Dubonnet, MS, OTR/L, CLT-LANA 04/02/23 4:04 PM

## 2023-04-04 ENCOUNTER — Ambulatory Visit: Payer: Self-pay | Admitting: Physical Therapy

## 2023-04-04 ENCOUNTER — Encounter: Payer: Self-pay | Admitting: *Deleted

## 2023-04-04 DIAGNOSIS — C541 Malignant neoplasm of endometrium: Secondary | ICD-10-CM

## 2023-04-04 DIAGNOSIS — M6281 Muscle weakness (generalized): Secondary | ICD-10-CM

## 2023-04-04 DIAGNOSIS — R2689 Other abnormalities of gait and mobility: Secondary | ICD-10-CM

## 2023-04-04 DIAGNOSIS — R278 Other lack of coordination: Secondary | ICD-10-CM

## 2023-04-04 DIAGNOSIS — R2681 Unsteadiness on feet: Secondary | ICD-10-CM

## 2023-04-04 DIAGNOSIS — M533 Sacrococcygeal disorders, not elsewhere classified: Secondary | ICD-10-CM

## 2023-04-04 NOTE — Progress Notes (Signed)
Daily Session Note  Patient Details  Name: Jaime Johnson MRN: [id number] Date of Birth: 1961-02-09 Referring Provider:    Encounter Date: 04/04/2023  Check In:  Session Check In - 04/04/23 1229       Check-In   Supervising physician immediately available to respond to emergencies See telemetry face sheet for immediately available ER MD    Location [facility]    Staff Present Fabio Pierce, MA, RCEP, CCRP, CCET;Noah Tickle, BS, Exercise Physiologist    Virtual Visit No    Medication changes reported     No    Fall or balance concerns reported    No    Warm-up and Cool-down Performed on first and last piece of equipment    Resistance Training Performed Yes    VAD Patient? No    PAD/SET Patient? No      Pain Assessment   Currently in Pain? No/denies                Social History   Tobacco Use  Smoking Status Former   Types: Cigarettes   Quit date: 10/22/1978   Years since quitting: 44.4  Smokeless Tobacco Never    Goals Met:  Proper associated with RPD/PD & O2 Sat Independence with exercise equipment Exercise tolerated well No report of concerns or symptoms today Strength training completed today  Goals Unmet:  Not Applicable  Comments: Pt able to follow exercise prescription today without complaint.  Will continue to monitor for progression.    Dr. Bethann Punches is Medical Director for [facility] Cardiac Rehabilitation.  Dr. Vida Rigger is Medical Director for [facility] Pulmonary Rehabilitation.

## 2023-04-04 NOTE — Therapy (Signed)
OUTPATIENT PHYSICAL THERAPY TREATMENT    Patient Name: Jaime Johnson MRN: [id number] DOB:01/05/1961, 62 y.o., female Today's Date: 04/04/2023   PT End of Session - 04/04/23 1336     Visit Number 2    Number of Visits 10    Date for PT Re-Evaluation 05/15/23    PT Start Time 1332    PT Stop Time 1415    PT Time Calculation (min) 43 min    Activity Tolerance Patient tolerated treatment well;No increased pain    Behavior During Therapy [facility] for tasks assessed/performed             Past Medical History:  Diagnosis Date   Allergic rhinitis due to pollen    Endometrial cancer (HCC)    Hyperlipidemia LDL goal <100 02/16/2020   Hypertension 12/19/2016   Past Surgical History:  Procedure Laterality Date   CESAREAN SECTION     COLONOSCOPY WITH PROPOFOL N/A 11/12/2016   Procedure: COLONOSCOPY WITH PROPOFOL;  Surgeon: Midge Minium, MD;  Location: [facility];  Service: Endoscopy;  Laterality: N/A;   COLONOSCOPY WITH PROPOFOL N/A 03/29/2017   Procedure: COLONOSCOPY WITH PROPOFOL;  Surgeon: Midge Minium, MD;  Location: [facility];  Service: Endoscopy;  Laterality: N/A;   COLONOSCOPY WITH PROPOFOL N/A 03/14/2020   Procedure: COLONOSCOPY WITH PROPOFOL;  Surgeon: Midge Minium, MD;  Location: [facility];  Service: Endoscopy;  Laterality: N/A;  priority 4   IR IMAGING GUIDED PORT INSERTION  06/01/2022   IR REMOVAL TUN ACCESS W/ PORT W/O FL MOD SED  01/17/2023   POLYPECTOMY  11/12/2016   Procedure: POLYPECTOMY;  Surgeon: Midge Minium, MD;  Location: [facility];  Service: Endoscopy;;   POLYPECTOMY  03/29/2017   Procedure: POLYPECTOMY;  Surgeon: Midge Minium, MD;  Location: [facility];  Service: Endoscopy;;   Patient Active Problem List   Diagnosis Date Noted   Colon cancer (HCC) 05/22/2022   Endometrial carcinoma (HCC) 03/09/2022   Foraminal stenosis of cervical region (RIGHT) 03/16/2020   Hyperlipidemia LDL goal <100 02/16/2020   Cervical radicular  pain (RIGHT) 09/29/2019   Bilateral carpal tunnel syndrome 09/29/2019   Essential hypertension 12/19/2016   Allergic rhinitis due to pollen     PCP: [facility]  REFERRING PROVIDER:  Smith Robert  REFERRING DIAG:  Diagnosis  C54.1 (ICD-10-CM) - Endometrial carcinoma (HCC)  I89.0 (ICD-10-CM) - Lymphedema  R29.898 (ICD-10-CM) - Weakness of both lower extremities    Rationale for Evaluation and Treatment Rehabilitation  THERAPY DIAG:  Sacrococcygeal disorders, not elsewhere classified  Muscle weakness (generalized)  Other lack of coordination  Other abnormalities of gait and mobility  Sacrococcygeal disorders, not elsewhere classified  ONSET DATE:  Endometrial CA Dx May 2023 with hysterectomy with complications and 5 brachi radiation Tx   SUBJECTIVE:  SUBJECTIVE STATEMENT  Everything is getting better by 50%. Pt has practiced the sit to stand technique has helped with the strain at her back.   Urinary urgency is better as well, she is able to make it on time before leakage.       SUBJECTIVE STATEMENT IN EVAL 03/06/23 : 1)  bowel and urinary leakage:  Pt completed radiation therapy for endometrial cancer in January. Following radiation, pt noticed bowel and urinary leakage. Pt has not had sexual intercourse for over a year because she is afraid. Pt had a hysterectomy.  Pt has no leakage during her exercise classes. Pt has 2 episodes of urinary leakage per day. Pt has fecal leakage a couple times before making it to the bathroom.   2) pelvis locks up, it "feels like a brick plate":  when standing up from sitting, it takes a few seconds for her leg to feel strong. It takes 30 mins to drive here. Pt has to stand at side of the car to loosen up her pelvis before walking.  After sitting after 20 min, she feels the  pelvis locks. But when she is on the treadmill, there is not issues the pelvis locking up. After exercising, the pelvis locks up.     Endometrial CA Dx May 2023 with hysterectomy with complications where her vaginal wall was cut to find a lost needle, chemotherapy for 16 weeks,   5 brachi radiation Tx   PERTINENT HISTORY:  Pt completed Physical Therapy after radiation and understood that she had spent time on her recliner for so long. Pt also is now going through [employer] classes and about to start Lymphedema.  Pt has an appt today for [place] to look at the growth in her thyroid.  Endometrial CA Dx May 2023 with hysterectomy with complications where her vaginal wall was cut to find a lost needle, chemotherapy for 16 weeks,   5 brachi radiation Tx    PAIN:  Are you having pain? Yes: see above   PRECAUTIONS: None  WEIGHT BEARING RESTRICTIONS: No  FALLS:  Has patient fallen in last 6 months? No  LIVING ENVIRONMENT: Lives with: lives with their family Lives in: House/apartment Stairs:    OCCUPATION:   PLOF: Independent  PATIENT GOALS:    OBJECTIVE:    [facility] PT Assessment - 04/04/23 1349       Palpation   SI assessment  levelled pelvic girdle      Bed Mobility   Bed Mobility --   getting into bed with poor technique which strains abdominal / pelvic areas            [facility] Adult PT Treatment/Exercise - 04/04/23 1359       Therapeutic Activites    Other Therapeutic Activities answered pt's questions , explained deep core system and why sit ups and crunches are not helpful, helped pt understand why water intake is important and corrected pt on her misconception about avoiding water intake to avoid urinary frequency and to not delay urination. Pt has the thought that if she delayed urination, she was strengthening her pelvic floor muscles.    Neuromuscular reeducation:                                 Cued for proper technique for deep core level 1-2            HOME  EXERCISE PROGRAM: See pt instruction section  ASSESSMENT:  CLINICAL IMPRESSION:  Pt reports the following improvement:  Everything is getting better by 50% with LBP and urinary frequency and urgency. Pt has practiced the sit to stand technique has helped with the strain at her back.  Urinary urgency is better as well, she is able to make it on time before leakage.    Pt demo'd levelled pelvic girdle and advanced to deep core training. Pt was provided information and answered her questions about water intake and crunches and not to delay urination.     Following Tx today which pt tolerated without complaints, pt demo'd improved logrolling  body mechanics to minimize straining ab/ pelvic floor.   Pt also demo'd proper technique for deep core HEP.     Plan to address pt's misalignment of pelvis at next session.   Pt benefits from skilled PT.    OBJECTIVE IMPAIRMENTS decreased activity tolerance, decreased coordination, decreased endurance, decreased mobility, difficulty walking, decreased ROM, decreased strength, decreased safety awareness, hypomobility, increased muscle spasms, impaired flexibility, improper body mechanics, postural dysfunction, and pain. scar restrictions   ACTIVITY LIMITATIONS  self-care home chores, work tasks    PARTICIPATION LIMITATIONS:  community, gym activities    PERSONAL FACTORS  Pt has multiple medical appts in addition to PT which  are also affecting patient's functional outcome. Pt also is now going through [employer] classes and about to start Lymphedema.  Pt has an appt today for [place] to look at the growth in her thyroid.  Endometrial CA Dx May 2023 with hysterectomy with complications where her vaginal wall was cut to find a lost needle, chemotherapy for 16 weeks,   5 brachi radiation Tx     REHAB POTENTIAL: Good   CLINICAL DECISION MAKING: Evolving/moderate complexity   EVALUATION COMPLEXITY: Moderate    PATIENT EDUCATION:    Education  details: Showed pt anatomy images. Explained muscles attachments/ connection, physiology of deep core system/ spinal- thoracic-pelvis-lower kinetic chain as they relate to pt's presentation, Sx, and past Hx. Explained what and how these areas of deficits need to be restored to balance and function    See Therapeutic activity / neuromuscular re-education section  Answered pt's questions.   Person educated: Patient Education method: Explanation, Demonstration, Tactile cues, Verbal cues, and Handouts Education comprehension: verbalized understanding, returned demonstration, verbal cues required, tactile cues required, and needs further education     PLAN: PT FREQUENCY: 1x/week   PT DURATION: 10 weeks   PLANNED INTERVENTIONS: Therapeutic exercises, Therapeutic activity, Neuromuscular re-education, Balance training, Gait training, Patient/Family education, Self Care, Joint mobilization, Spinal mobilization, Moist heat, Taping, and Manual therapy, dry needling.   PLAN FOR NEXT SESSION: See clinical impression for plan     GOALS: Goals reviewed with patient? Yes  SHORT TERM GOALS: Target date: 04/03/2023    Pt will demo IND with HEP                    Baseline: Not IND            Goal status: INITIAL   LONG TERM GOALS: Target date: 05/15/2023   1.Pt will demo proper deep core coordination without chest breathing and optimal excursion of diaphragm/pelvic floor in order to promote spinal stability and pelvic floor function  Baseline: dyscoordination Goal status: INITIAL  2.  Pt will demo decreased abdominal and perineal scar restrictions and demo proper lengthening of fascial in theses areas Baseline: restricted  Goal status: INITIAL  3.  Pt will demo proper body mechanics  in against gravity tasks and ADLs  work tasks, fitness  to minimize straining pelvic floor / back                  Baseline: not IND, improper form that places strain on pelvic floor                Goal status:  INITIAL    4. Pt will demo levelled pelvic girdle and shoulder height in order to progress to deep core strengthening HEP and restore mobility at spine, pelvis, gait, posture   Baseline: R iliac crest higher  Goal status: INITIAL    5. Pt will demo increased gait speed > 1.0 m/s and reciporcal gait pattern with more arm swing in order to ambulate safely in community and return to fitness routine  Baseline: 0.7 m/s limited armswing, thoracic mobility, excessive L pelvis sway  Goal status: INITIAL  6. Pt will report decreased urinary and fecal leakage episodes by 50%   Baseline: Pt has 2 episodes of urinary leakage per day. Pt has fecal leakage a couple times before making it to the bathroom.  Goal: INITIAL       Mariane Masters, PT 04/04/2023, 1:37 PM

## 2023-04-04 NOTE — Patient Instructions (Addendum)
explained deep core system and why sit ups and crunches are not helpful, helped pt understand why water intake is important and corrected pt on her misconception about avoiding water intake to avoid urinary frequency   Deep core level 1-2 ( morning and night) see hand out)

## 2023-04-09 ENCOUNTER — Ambulatory Visit: Payer: Self-pay | Admitting: Occupational Therapy

## 2023-04-09 ENCOUNTER — Other Ambulatory Visit: Payer: Self-pay | Admitting: Family Medicine

## 2023-04-09 DIAGNOSIS — C541 Malignant neoplasm of endometrium: Secondary | ICD-10-CM

## 2023-04-09 NOTE — Telephone Encounter (Signed)
Last office visit 06/27/22 for HTN.  Last refilled 08/27/22 for #30 with 3 refills.  Next Appt: No future appointments with PCP.

## 2023-04-09 NOTE — Progress Notes (Signed)
Daily Session Note  Patient Details  Name: Jaime Johnson MRN: [id number] Date of Birth: 14-Oct-1961 Referring Provider:    Encounter Date: 04/09/2023  Check In:  Session Check In - 04/09/23 1245       Check-In   Supervising physician immediately available to respond to emergencies See telemetry face sheet for immediately available ER MD    Location [facility]    Staff Present Ronette Deter, BS, Exercise Physiologist;Kristen Coble, RN,BC,MSN    Virtual Visit No    Medication changes reported     No    Fall or balance concerns reported    No    Warm-up and Cool-down Performed on first and last piece of equipment    Resistance Training Performed Yes    VAD Patient? No    PAD/SET Patient? No      Pain Assessment   Currently in Pain? No/denies    Multiple Pain Sites No                Social History   Tobacco Use  Smoking Status Former   Types: Cigarettes   Quit date: 10/22/1978   Years since quitting: 44.4  Smokeless Tobacco Never    Goals Met:  Independence with exercise equipment Exercise tolerated well No report of concerns or symptoms today  Goals Unmet:  Not Applicable  Comments: Pt able to follow exercise prescription today without complaint.  Will continue to monitor for progression.    Dr. Bethann Punches is Medical Director for [facility] Card[facility].  Dr. Vida Rigger is Medical Director for [facility] Pulmonary Rehabilitation.

## 2023-04-11 ENCOUNTER — Other Ambulatory Visit: Payer: Self-pay

## 2023-04-11 ENCOUNTER — Ambulatory Visit: Payer: Self-pay | Admitting: Occupational Therapy

## 2023-04-11 ENCOUNTER — Encounter: Payer: Self-pay | Admitting: *Deleted

## 2023-04-11 DIAGNOSIS — C541 Malignant neoplasm of endometrium: Secondary | ICD-10-CM

## 2023-04-11 NOTE — Progress Notes (Signed)
Daily Session Note  Patient Details  Name: AMENAH GRAYDON MRN: [id number] Date of Birth: 12-30-1960 Referring Provider:    Encounter Date: 04/11/2023  Check In:  Session Check In - 04/11/23 1230       Check-In   Supervising physician immediately available to respond to emergencies See telemetry face sheet for immediately available ER MD    Location [facility]    Staff Present Susann Givens, RN BSN;Noah Tickle, BS, Exercise Physiologist    Virtual Visit No    Medication changes reported     No    Fall or balance concerns reported    No    Tobacco Cessation No Change    Warm-up and Cool-down Performed on first and last piece of equipment    Resistance Training Performed Yes    VAD Patient? No    PAD/SET Patient? No      Pain Assessment   Currently in Pain? No/denies                Social History   Tobacco Use  Smoking Status Former   Types: Cigarettes   Quit date: 10/22/1978   Years since quitting: 44.4  Smokeless Tobacco Never    Goals Met:  Independence with exercise equipment Exercise tolerated well No report of concerns or symptoms today Strength training completed today  Goals Unmet:  Not Applicable  Comments: Pt able to follow exercise prescription today without complaint.  Will continue to monitor for progression.    Dr. Bethann Punches is Medical Director for [facility] Card[facility] Rehabilitation.  Dr. Vida Rigger is Medical Director for [facility] Pulmonary Rehabilitation.

## 2023-04-12 ENCOUNTER — Ambulatory Visit: Payer: Self-pay | Admitting: Physical Therapy

## 2023-04-16 ENCOUNTER — Ambulatory Visit: Payer: Self-pay | Admitting: Occupational Therapy

## 2023-04-16 ENCOUNTER — Encounter: Payer: Self-pay | Admitting: *Deleted

## 2023-04-16 DIAGNOSIS — C541 Malignant neoplasm of endometrium: Secondary | ICD-10-CM

## 2023-04-16 NOTE — Progress Notes (Signed)
Daily Session Note  Patient Details  Name: Jaime Johnson MRN: [id number] Date of Birth: Jul 14, 1961 Referring Provider:    Encounter Date: 04/16/2023  Check In:  Session Check In - 04/16/23 1338       Check-In   Supervising physician immediately available to respond to emergencies See telemetry face sheet for immediately available ER MD    Location [facility]    Staff Present Susann Givens, RN BSN;Noah Tickle, BS, Exercise Physiologist    Virtual Visit No    Medication changes reported     No    Fall or balance concerns reported    No    Tobacco Cessation No Change    Warm-up and Cool-down Performed on first and last piece of equipment    Resistance Training Performed Yes    VAD Patient? No    PAD/SET Patient? No      Pain Assessment   Currently in Pain? No/denies                Social History   Tobacco Use  Smoking Status Former   Types: Cigarettes   Quit date: 10/22/1978   Years since quitting: 44.5  Smokeless Tobacco Never    Goals Met:  Independence with exercise equipment Exercise tolerated well No report of concerns or symptoms today Strength training completed today  Goals Unmet:  Not Applicable  Comments: Pt able to follow exercise prescription today without complaint.  Will continue to monitor for progression.    Dr. Bethann Punches is Medical Director for [facility] Card[facility] Rehabilitation.  Dr. Vida Rigger is Medical Director for [facility] Pulmonary Rehabilitation.

## 2023-04-17 DIAGNOSIS — E041 Nontoxic single thyroid nodule: Secondary | ICD-10-CM | POA: Insufficient documentation

## 2023-04-18 ENCOUNTER — Ambulatory Visit: Payer: Self-pay | Admitting: Occupational Therapy

## 2023-04-18 ENCOUNTER — Encounter: Payer: Self-pay | Admitting: *Deleted

## 2023-04-18 DIAGNOSIS — C541 Malignant neoplasm of endometrium: Secondary | ICD-10-CM

## 2023-04-18 NOTE — Progress Notes (Signed)
Daily Session Note  Patient Details  Name: Jaime Johnson MRN: [id number] Date of Birth: Mar 25, 1961 Referring Provider:    Encounter Date: 04/18/2023  Check In:  Session Check In - 04/18/23 1231       Check-In   Supervising physician immediately available to respond to emergencies See telemetry face sheet for immediately available ER MD    Location [facility]    Staff Present Susann Givens, RN BSN;Noah Tickle, BS, Exercise Physiologist    Virtual Visit No    Medication changes reported     No    Fall or balance concerns reported    No    Warm-up and Cool-down Performed on first and last piece of equipment    Resistance Training Performed Yes    VAD Patient? No    PAD/SET Patient? No      Pain Assessment   Currently in Pain? No/denies                Social History   Tobacco Use  Smoking Status Former   Types: Cigarettes   Quit date: 10/22/1978   Years since quitting: 44.5  Smokeless Tobacco Never    Goals Met:  Independence with exercise equipment Exercise tolerated well No report of concerns or symptoms today Strength training completed today  Goals Unmet:  Not Applicable  Comments: Pt able to follow exercise prescription today without complaint.  Will continue to monitor for progression.    Dr. Bethann Punches is Medical Director for [facility] Cardiac Rehabilitation.  Dr. Vida Rigger is Medical Director for [facility] Pulmonary Rehabilitation.

## 2023-04-19 ENCOUNTER — Ambulatory Visit: Payer: Self-pay | Admitting: Physical Therapy

## 2023-04-23 ENCOUNTER — Encounter
Admission: RE | Admit: 2023-04-23 | Discharge: 2023-04-23 | Disposition: A | Discharge status: Home / Self Care | Payer: Self-pay | Source: Ambulatory Visit | Attending: Unknown Physician Specialty | Admitting: Unknown Physician Specialty

## 2023-04-23 ENCOUNTER — Other Ambulatory Visit: Payer: Self-pay

## 2023-04-23 ENCOUNTER — Ambulatory Visit: Payer: Self-pay | Admitting: Occupational Therapy

## 2023-04-23 ENCOUNTER — Encounter: Payer: Self-pay | Attending: Hospice and Palliative Medicine | Admitting: *Deleted

## 2023-04-23 VITALS — Ht 62.0 in | Wt 149.0 lb

## 2023-04-23 VITALS — Ht 62.8 in | Wt 150.0 lb

## 2023-04-23 DIAGNOSIS — E785 Hyperlipidemia, unspecified: Secondary | ICD-10-CM

## 2023-04-23 DIAGNOSIS — I440 Atrioventricular block, first degree: Secondary | ICD-10-CM | POA: Insufficient documentation

## 2023-04-23 DIAGNOSIS — C541 Malignant neoplasm of endometrium: Secondary | ICD-10-CM | POA: Insufficient documentation

## 2023-04-23 DIAGNOSIS — Z01810 Encounter for preprocedural cardiovascular examination: Secondary | ICD-10-CM | POA: Insufficient documentation

## 2023-04-23 DIAGNOSIS — I10 Essential (primary) hypertension: Secondary | ICD-10-CM

## 2023-04-23 NOTE — Patient Instructions (Addendum)
Your procedure is scheduled on: Tuesday 04/30/23 To find out your arrival time, please call [phone] between 1PM - 3PM on:   Monday 04/29/23 Report to the Registration Desk on the 1st floor of the [facility]. Free Valet parking is available.  If your arrival time is 6:00 am, do not arrive before that time as the [facility] entrance doors do not open until 6:00 am.  REMEMBER: Instructions that are not followed completely may result in serious medical risk, up to and including death; or upon the discretion of your surgeon and anesthesiologist your surgery may need to be rescheduled.  Do not eat food after midnight the night before surgery.  No gum chewing or hard candies.  You may however, drink CLEAR liquids up to 2 hours before you are scheduled to arrive for your surgery. Do not drink anything within 2 hours of your scheduled arrival time.  Clear liquids include: - water  - apple juice without pulp - gatorade (not RED colors) - black coffee or tea (Do NOT add milk or creamers to the coffee or tea) Do NOT drink anything that is not on this list.  Type 1 and Type 2 diabetics should only drink water.  One week prior to surgery: Stop Anti-inflammatories (NSAIDS) such as Advil, Aleve, Ibuprofen, Motrin, Naproxen, Naprosyn and Aspirin based products such as Excedrin, Goody's Powder, BC Powder. You may however, continue to take Tylenol if needed for pain up until the day of surgery.  Stop ANY OVER THE COUNTER supplements and vitamins until after surgery.  Continue taking all prescription medications.   TAKE ONLY THESE MEDICATIONS THE MORNING OF SURGERY WITH A SIP OF WATER:  none  No Alcohol for 24 hours before or after surgery.  No Smoking including e-cigarettes for 24 hours before surgery.  No chewable tobacco products for at least 6 hours before surgery.  No nicotine patches on the day of surgery.  Do not use any "recreational" drugs for at least a week (preferably 2  weeks) before your surgery.  Please be advised that the combination of cocaine and anesthesia may have negative outcomes, up to and including death. If you test positive for cocaine, your surgery will be cancelled.  On the morning of surgery brush your teeth with toothpaste and water, you may rinse your mouth with mouthwash if you wish. Do not swallow any toothpaste or mouthwash.  Use CHG Soap or wipes as directed on instruction sheet.  Do not wear lotions, powders, or perfumes.   Do not shave body hair from the neck down 48 hours before surgery.  Wear comfortable clothing (specific to your surgery type) to the hospital.  Do not wear jewelry, make-up, hairpins, clips or nail polish.  Contact lenses, hearing aids and dentures may not be worn into surgery.  Do not bring valuables to the hospital. [facility] is not responsible for any missing/lost belongings or valuables.   Notify your doctor if there is any change in your medical condition (cold, fever, infection).  If you are being discharged the day of surgery, you will not be allowed to drive home. You will need a responsible individual to drive you home and stay with you for 24 hours after surgery.   If you are taking public transportation, you will need to have a responsible individual with you.  If you are being admitted to the hospital overnight, leave your suitcase in the car. After surgery it may be brought to your room.  In case of  increased patient census, it may be necessary for you, the patient, to continue your postoperative care in the Same Day Surgery department.  After surgery, you can help prevent lung complications by doing breathing exercises.  Take deep breaths and cough every 1-2 hours. Your doctor may order a device called an Incentive Spirometer to help you take deep breaths. When coughing or sneezing, hold a pillow firmly against your incision with both hands. This is called "splinting." Doing this helps  protect your incision. It also decreases belly discomfort.  Surgery Visitation Policy:  Patients undergoing a surgery or procedure may have two family members or support persons with them as long as the person is not COVID-19 positive or experiencing its symptoms.   Inpatient Visitation:    Visiting hours are 7 a.m. to 8 p.m. Up to four visitors are allowed at one time in a patient room. The visitors may rotate out with other people during the day. One designated support person (adult) may remain overnight.  Please call the Pre-admissions Testing Dept. at [phone] if you have any questions about these instructions.     Preparing for Surgery with CHLORHEXIDINE GLUCONATE (CHG) Soap  Chlorhexidine Gluconate (CHG) Soap  o An antiseptic cleaner that kills germs and bonds with the skin to continue killing germs even after washing  o Used for showering the night before surgery and morning of surgery  Before surgery, you can play an important role by reducing the number of germs on your skin.  CHG (Chlorhexidine gluconate) soap is an antiseptic cleanser which kills germs and bonds with the skin to continue killing germs even after washing.  Please do not use if you have an allergy to CHG or antibacterial soaps. If your skin becomes reddened/irritated stop using the CHG.  1. Shower the NIGHT BEFORE SURGERY and the MORNING OF SURGERY with CHG soap.  2. If you choose to wash your hair, wash your hair first as usual with your normal shampoo.  3. After shampooing, rinse your hair and body thoroughly to remove the shampoo.  4. Use CHG as you would any other liquid soap. You can apply CHG directly to the skin and wash gently with a scrungie or a clean washcloth.  5. Apply the CHG soap to your body only from the neck down. Do not use on open wounds or open sores. Avoid contact with your eyes, ears, mouth, and genitals (private parts). Wash face and genitals (private parts) with your normal  soap.  6. Wash thoroughly, paying special attention to the area where your surgery will be performed.  7. Thoroughly rinse your body with warm water.  8. Do not shower/wash with your normal soap after using and rinsing off the CHG soap.  9. Pat yourself dry with a clean towel.  10. Wear clean pajamas to bed the night before surgery.  12. Place clean sheets on your bed the night of your first shower and do not sleep with pets.  13. Shower again with the CHG soap on the day of surgery prior to arriving at the hospital.  14. Do not apply any deodorants/lotions/powders.  15. Please wear clean clothes to the hospital.

## 2023-04-23 NOTE — Patient Instructions (Signed)
Discharge Patient Instructions  Patient Details  Name: Jaime Johnson MRN: [id number] Date of Birth: November 23, 1960 Referring Provider:  Hannah Beat, MD   Number of Visits: 24  Reason for Discharge:  Patient reached a stable level of exercise. Patient independent in their exercise. Patient has met program and personal goals.  Diagnosis:  Endometrial carcinoma (HCC)  Initial Exercise Prescription:  Initial Exercise Prescription - 01/30/23 1000       Date of Initial Exercise RX and Referring Provider   Date 01/29/23      Oxygen   Maintain Oxygen Saturation 88% or higher      NuStep   Level 2    SPM 80    Minutes 15    METs 2.6      REL-XR   Level 1    Speed 50    Minutes 15    METs 2.6      Biostep-RELP   Level 1    SPM 50    Minutes 15    METs 2.6      Track   Laps 22    Minutes 15    METs 2.2      Prescription Details   Frequency (times per week) 2    Duration Progress to 30 minutes of continuous aerobic without signs/symptoms of physical distress      Intensity   THRR 40-80% of Max Heartrate 111- 142    Ratings of Perceived Exertion 11-13    Perceived Dyspnea 0-4      Progression   Progression Continue to progress workloads to maintain intensity without signs/symptoms of physical distress.      Resistance Training   Training Prescription Yes    Weight 3 lb    Reps 10-15             Discharge Exercise Prescription (Final Exercise Prescription Changes):  Exercise Prescription Changes - 03/26/23 1300       Home Exercise Plan   Plans to continue exercise at [facility]   Plans on joining the [place] with her husband and using treadmill at home   Frequency Add 3 additional days to program exercise sessions.    Initial Home Exercises Provided 03/26/23             Functional Capacity:  6 Minute Walk     Row Name 01/29/23 1327 04/23/23 1244       6 Minute Walk   Phase Initial Discharge    Distance 935 feet 1340 feet     Distance % Change -- 43.3 %    Distance Feet Change -- 405 ft    Walk Time 6 minutes 6 minutes    # of Rest Breaks 0 0    MPH 1.77 2.54    METS 2.63 3.48    RPE 11 13    Perceived Dyspnea  0 0    VO2 Peak 9.23 12.17    Symptoms Yes (comment) No    Comments Left foot neuropathy pain --    Resting HR 80 bpm 80 bpm    Resting BP 128/82 146/78    Resting Oxygen Saturation  97 % 97 %    Exercise Oxygen Saturation  during 6 min walk 96 % 96 %    Max Ex. HR 93 bpm 104 bpm    Max Ex. BP 142/82 148/80    2 Minute Post BP 132/80 --            Nutrition & Weight - Outcomes:  Pre Biometrics - 01/29/23 1324       Pre Biometrics   Height 5' 2.8" (1.595 m)    Weight 152 lb 12.8 oz (69.3 kg)    BMI (Calculated) 27.24    Single Leg Stand 30 seconds             Post Biometrics - 04/23/23 1245        Post  Biometrics   Height 5' 2.8" (1.595 m)    Weight 150 lb (68 kg)    BMI (Calculated) 26.75    Single Leg Stand 30 seconds

## 2023-04-23 NOTE — Progress Notes (Signed)
Daily Session Note  Patient Details  Name: Jaime Johnson MRN: [id number] Date of Birth: May 09, 1961 Referring Provider:    Encounter Date: 04/23/2023  Check In:  Session Check In - 04/23/23 1217       Check-In   Supervising physician immediately available to respond to emergencies See telemetry face sheet for immediately available ER MD    Location [facility]    Staff Present Susann Givens, RN BSN;Noah Tickle, BS, Exercise Physiologist    Virtual Visit No    Medication changes reported     No    Fall or balance concerns reported    No    Warm-up and Cool-down Performed on first and last piece of equipment    Resistance Training Performed Yes    VAD Patient? No    PAD/SET Patient? No      Pain Assessment   Currently in Pain? No/denies                Social History   Tobacco Use  Smoking Status Former   Types: Cigarettes   Quit date: 10/22/1978   Years since quitting: 44.5  Smokeless Tobacco Never    Goals Met:  Independence with exercise equipment Exercise tolerated well No report of concerns or symptoms today Strength training completed today  Goals Unmet:  Not Applicable  Comments: Pt able to follow exercise prescription today without complaint.  Will continue to monitor for progression.    Dr. Bethann Punches is Medical Director for [facility] Cardiac Rehabilitation.  Dr. Vida Rigger is Medical Director for [facility] Pulmonary Rehabilitation.

## 2023-04-24 ENCOUNTER — Other Ambulatory Visit: Payer: Self-pay

## 2023-04-26 ENCOUNTER — Other Ambulatory Visit: Payer: Self-pay | Admitting: Family Medicine

## 2023-04-30 ENCOUNTER — Encounter: Payer: Self-pay | Admitting: Occupational Therapy

## 2023-04-30 ENCOUNTER — Observation Stay
Admission: RE | Admit: 2023-04-30 | Discharge: 2023-05-02 | Disposition: A | Discharge status: Home / Self Care | Payer: Self-pay | Source: Ambulatory Visit | Attending: Unknown Physician Specialty | Admitting: Unknown Physician Specialty

## 2023-04-30 ENCOUNTER — Other Ambulatory Visit: Payer: Self-pay

## 2023-04-30 ENCOUNTER — Encounter
Admission: RE | Disposition: A | Discharge status: Home / Self Care | Payer: Self-pay | Source: Ambulatory Visit | Attending: Unknown Physician Specialty

## 2023-04-30 ENCOUNTER — Encounter: Payer: Self-pay | Admitting: Unknown Physician Specialty

## 2023-04-30 ENCOUNTER — Ambulatory Visit: Payer: Self-pay | Admitting: Registered Nurse

## 2023-04-30 DIAGNOSIS — E890 Postprocedural hypothyroidism: Principal | ICD-10-CM

## 2023-04-30 DIAGNOSIS — E041 Nontoxic single thyroid nodule: Principal | ICD-10-CM | POA: Insufficient documentation

## 2023-04-30 HISTORY — PX: THYROIDECTOMY: SHX17

## 2023-04-30 HISTORY — PX: CONTINUOUS NERVE MONITORING: SHX6650

## 2023-04-30 LAB — CALCIUM
Calcium: 9 mg/dL (ref 8.9–10.3)
Calcium: 8.8 mg/dL — ABNORMAL LOW (ref 8.9–10.3)

## 2023-04-30 SURGERY — THYROIDECTOMY
Anesthesia: General | Site: Throat

## 2023-04-30 MED ORDER — REMIFENTANIL HCL 1 MG IV SOLR
INTRAVENOUS | Status: AC
  Filled 2023-04-30: qty 1000

## 2023-04-30 MED ORDER — PROPOFOL 500 MG/50ML IV EMUL
INTRAVENOUS | Status: DC | PRN
  Administered 2023-04-30: 175 ug/kg/min via INTRAVENOUS

## 2023-04-30 MED ORDER — 0.9 % SODIUM CHLORIDE (POUR BTL) OPTIME
TOPICAL | Status: DC | PRN
  Administered 2023-04-30: 500 mL

## 2023-04-30 MED ORDER — FAMOTIDINE 20 MG PO TABS
20.0000 mg | ORAL_TABLET | Freq: Once | ORAL | Status: AC
  Administered 2023-04-30: 20 mg via ORAL

## 2023-04-30 MED ORDER — FAMOTIDINE 20 MG PO TABS
ORAL_TABLET | ORAL | Status: AC
  Filled 2023-04-30: qty 1

## 2023-04-30 MED ORDER — CHLORHEXIDINE GLUCONATE 0.12 % MT SOLN
OROMUCOSAL | Status: AC
  Filled 2023-04-30: qty 15

## 2023-04-30 MED ORDER — LIDOCAINE-EPINEPHRINE 1 %-1:100000 IJ SOLN
INTRAMUSCULAR | Status: DC | PRN
  Administered 2023-04-30: 5 mL

## 2023-04-30 MED ORDER — PROMETHAZINE HCL 25 MG/ML IJ SOLN: 6.2500 mg | INTRAMUSCULAR | Status: DC | PRN

## 2023-04-30 MED ORDER — PHENYLEPHRINE HCL (PRESSORS) 10 MG/ML IV SOLN
INTRAVENOUS | Status: DC | PRN
  Administered 2023-04-30: 80 ug via INTRAVENOUS
  Administered 2023-04-30: 160 ug via INTRAVENOUS
  Administered 2023-04-30: 80 ug via INTRAVENOUS

## 2023-04-30 MED ORDER — HYDROCODONE-ACETAMINOPHEN 5-325 MG PO TABS
ORAL_TABLET | ORAL | Status: AC
  Filled 2023-04-30: qty 1

## 2023-04-30 MED ORDER — FENTANYL CITRATE (PF) 100 MCG/2ML IJ SOLN
INTRAMUSCULAR | Status: AC
  Filled 2023-04-30: qty 2

## 2023-04-30 MED ORDER — MIDAZOLAM HCL 2 MG/2ML IJ SOLN
INTRAMUSCULAR | Status: DC | PRN
  Administered 2023-04-30: 2 mg via INTRAVENOUS

## 2023-04-30 MED ORDER — ACETAMINOPHEN 10 MG/ML IV SOLN
INTRAVENOUS | Status: AC
  Filled 2023-04-30: qty 100

## 2023-04-30 MED ORDER — OXYCODONE HCL 5 MG PO TABS
5.0000 mg | ORAL_TABLET | Freq: Once | ORAL | Status: AC | PRN
  Administered 2023-04-30: 5 mg via ORAL

## 2023-04-30 MED ORDER — ACETAMINOPHEN 10 MG/ML IV SOLN
INTRAVENOUS | Status: DC | PRN
  Administered 2023-04-30: 1000 mg via INTRAVENOUS

## 2023-04-30 MED ORDER — PHENYLEPHRINE HCL-NACL 20-0.9 MG/250ML-% IV SOLN
INTRAVENOUS | Status: AC
  Filled 2023-04-30: qty 250

## 2023-04-30 MED ORDER — DEXAMETHASONE SODIUM PHOSPHATE 10 MG/ML IJ SOLN
INTRAMUSCULAR | Status: DC | PRN
  Administered 2023-04-30: 10 mg via INTRAVENOUS

## 2023-04-30 MED ORDER — GLYCOPYRROLATE 0.2 MG/ML IJ SOLN
INTRAMUSCULAR | Status: AC
  Filled 2023-04-30: qty 1

## 2023-04-30 MED ORDER — OXYCODONE HCL 5 MG PO TABS
ORAL_TABLET | ORAL | Status: AC
  Filled 2023-04-30: qty 1

## 2023-04-30 MED ORDER — LIDOCAINE HCL (CARDIAC) PF 100 MG/5ML IV SOSY
PREFILLED_SYRINGE | INTRAVENOUS | Status: DC | PRN
  Administered 2023-04-30: 60 mg via INTRAVENOUS
  Administered 2023-04-30: 40 mg via INTRAVENOUS

## 2023-04-30 MED ORDER — ONDANSETRON HCL 4 MG/2ML IJ SOLN
INTRAMUSCULAR | Status: DC | PRN
  Administered 2023-04-30: 4 mg via INTRAVENOUS

## 2023-04-30 MED ORDER — LIDOCAINE-EPINEPHRINE 1 %-1:100000 IJ SOLN
INTRAMUSCULAR | Status: AC
  Filled 2023-04-30: qty 1

## 2023-04-30 MED ORDER — HYDROCODONE-ACETAMINOPHEN 5-325 MG PO TABS
1.0000 | ORAL_TABLET | ORAL | Status: DC | PRN
  Administered 2023-04-30 – 2023-05-02 (×8): 1 via ORAL

## 2023-04-30 MED ORDER — SUCCINYLCHOLINE CHLORIDE 200 MG/10ML IV SOSY
PREFILLED_SYRINGE | INTRAVENOUS | Status: AC
  Filled 2023-04-30: qty 10

## 2023-04-30 MED ORDER — GLYCOPYRROLATE 0.2 MG/ML IJ SOLN
INTRAMUSCULAR | Status: DC | PRN
  Administered 2023-04-30: .2 mg via INTRAVENOUS

## 2023-04-30 MED ORDER — ONDANSETRON HCL 4 MG/2ML IJ SOLN: 4.0000 mg | INTRAMUSCULAR | Status: DC | PRN

## 2023-04-30 MED ORDER — ORAL CARE MOUTH RINSE: 15.0000 mL | Freq: Once | OROMUCOSAL | Status: AC

## 2023-04-30 MED ORDER — SUCCINYLCHOLINE CHLORIDE 200 MG/10ML IV SOSY
PREFILLED_SYRINGE | INTRAVENOUS | Status: DC | PRN
  Administered 2023-04-30: 140 mg via INTRAVENOUS

## 2023-04-30 MED ORDER — CHLORHEXIDINE GLUCONATE 0.12 % MT SOLN
15.0000 mL | Freq: Once | OROMUCOSAL | Status: AC
  Administered 2023-04-30: 15 mL via OROMUCOSAL

## 2023-04-30 MED ORDER — DROPERIDOL 2.5 MG/ML IJ SOLN: 0.6250 mg | Freq: Once | INTRAMUSCULAR | Status: DC | PRN

## 2023-04-30 MED ORDER — HYDROCHLOROTHIAZIDE 12.5 MG PO TABS
12.5000 mg | ORAL_TABLET | Freq: Every day | ORAL | Status: DC
  Administered 2023-04-30 – 2023-05-01 (×2): 12.5 mg via ORAL
  Filled 2023-04-30 (×3): qty 1

## 2023-04-30 MED ORDER — LOSARTAN POTASSIUM 50 MG PO TABS
ORAL_TABLET | ORAL | Status: AC
  Filled 2023-04-30: qty 2

## 2023-04-30 MED ORDER — MIDAZOLAM HCL 2 MG/2ML IJ SOLN
INTRAMUSCULAR | Status: AC
  Filled 2023-04-30: qty 2

## 2023-04-30 MED ORDER — OXYCODONE HCL 5 MG/5ML PO SOLN: 5.0000 mg | Freq: Once | ORAL | Status: AC | PRN

## 2023-04-30 MED ORDER — SODIUM CHLORIDE (PF) 0.9 % IJ SOLN
INTRAMUSCULAR | Status: AC
  Filled 2023-04-30: qty 20

## 2023-04-30 MED ORDER — LACTATED RINGERS IV SOLN: INTRAVENOUS | Status: DC

## 2023-04-30 MED ORDER — REMIFENTANIL HCL 1 MG IV SOLR
INTRAVENOUS | Status: DC | PRN
  Administered 2023-04-30: .1 ug/kg/min via INTRAVENOUS

## 2023-04-30 MED ORDER — PROPOFOL 10 MG/ML IV BOLUS
INTRAVENOUS | Status: AC
  Filled 2023-04-30: qty 20

## 2023-04-30 MED ORDER — FENTANYL CITRATE (PF) 100 MCG/2ML IJ SOLN
INTRAMUSCULAR | Status: DC | PRN
  Administered 2023-04-30: 50 ug via INTRAVENOUS
  Administered 2023-04-30: 25 ug via INTRAVENOUS

## 2023-04-30 MED ORDER — PROPOFOL 1000 MG/100ML IV EMUL
INTRAVENOUS | Status: AC
  Filled 2023-04-30: qty 100

## 2023-04-30 MED ORDER — PHENYLEPHRINE HCL-NACL 20-0.9 MG/250ML-% IV SOLN
INTRAVENOUS | Status: DC | PRN
  Administered 2023-04-30: 25 ug/min via INTRAVENOUS

## 2023-04-30 MED ORDER — FENTANYL CITRATE (PF) 100 MCG/2ML IJ SOLN
25.0000 ug | INTRAMUSCULAR | Status: DC | PRN
  Administered 2023-04-30 (×4): 25 ug via INTRAVENOUS

## 2023-04-30 MED ORDER — DEXAMETHASONE SODIUM PHOSPHATE 10 MG/ML IJ SOLN
INTRAMUSCULAR | Status: AC
  Filled 2023-04-30: qty 1

## 2023-04-30 MED ORDER — CALCIUM CARBONATE 1250 (500 CA) MG PO TABS
1250.0000 mg | ORAL_TABLET | Freq: Two times a day (BID) | ORAL | Status: DC
  Administered 2023-04-30: 1250 mg via ORAL
  Filled 2023-04-30 (×3): qty 1

## 2023-04-30 MED ORDER — PROPOFOL 10 MG/ML IV BOLUS
INTRAVENOUS | Status: DC | PRN
  Administered 2023-04-30: 140 mg via INTRAVENOUS

## 2023-04-30 MED ORDER — ONDANSETRON HCL 4 MG/2ML IJ SOLN
INTRAMUSCULAR | Status: AC
  Filled 2023-04-30: qty 2

## 2023-04-30 MED ORDER — ONDANSETRON HCL 4 MG PO TABS: 4.0000 mg | ORAL_TABLET | ORAL | Status: DC | PRN

## 2023-04-30 MED ORDER — ACETAMINOPHEN 10 MG/ML IV SOLN: 1000.0000 mg | Freq: Once | INTRAVENOUS | Status: DC | PRN

## 2023-04-30 MED ORDER — LOSARTAN POTASSIUM-HCTZ 100-12.5 MG PO TABS: 1.0000 | ORAL_TABLET | Freq: Every day | ORAL | Status: DC

## 2023-04-30 MED ORDER — LIDOCAINE HCL (PF) 2 % IJ SOLN
INTRAMUSCULAR | Status: AC
  Filled 2023-04-30: qty 5

## 2023-04-30 MED ORDER — LOSARTAN POTASSIUM 50 MG PO TABS
100.0000 mg | ORAL_TABLET | Freq: Every day | ORAL | Status: DC
  Administered 2023-04-30 – 2023-05-01 (×2): 100 mg via ORAL

## 2023-04-30 SURGICAL SUPPLY — 44 items
ADH SKN CLS APL DERMABOND .7 (GAUZE/BANDAGES/DRESSINGS) ×2
ATTRACTOMAT 16X20 MAGNETIC DRP (DRAPES) ×2 IMPLANT
BLADE SURG 15 STRL LF DISP TIS (BLADE) ×2 IMPLANT
BLADE SURG 15 STRL SS (BLADE) ×2
BULB RESERV EVAC DRAIN JP 100C (MISCELLANEOUS) IMPLANT
CLEANER TIP ELECTROSURG 2X2 (MISCELLANEOUS) IMPLANT
CORD BIP STRL DISP 12FT (MISCELLANEOUS) ×2 IMPLANT
DERMABOND ADVANCED .7 DNX12 (GAUZE/BANDAGES/DRESSINGS) ×2 IMPLANT
DRAIN JP 10F RND SILICONE (MISCELLANEOUS) IMPLANT
DRSG TEGADERM 2-3/8X2-3/4 SM (GAUZE/BANDAGES/DRESSINGS) ×2 IMPLANT
ELECT CAUTERY BLADE TIP 2.5 (TIP)
ELECT LARYNGEAL DUAL CHAN (ELECTRODE) ×2 IMPLANT
ELECT NEEDLE 20X.3 GREEN (MISCELLANEOUS) ×2
ELECT REM PT RETURN 9FT ADLT (ELECTROSURGICAL) ×2
ELECTRODE CAUTERY BLDE TIP 2.5 (TIP) ×2 IMPLANT
ELECTRODE NDL 20X.3 GREEN (MISCELLANEOUS) IMPLANT
ELECTRODE NEEDLE 20X.3 GREEN (MISCELLANEOUS) ×2 IMPLANT
ELECTRODE REM PT RTRN 9FT ADLT (ELECTROSURGICAL) ×2 IMPLANT
FORCEPS JEWEL BIP 4-3/4 STR (INSTRUMENTS) ×2 IMPLANT
GAUZE 4X4 16PLY ~~LOC~~+RFID DBL (SPONGE) ×4 IMPLANT
GLOVE BIO SURGEON STRL SZ7.5 (GLOVE) ×4 IMPLANT
GOWN STRL REUS W/ TWL LRG LVL3 (GOWN DISPOSABLE) ×6 IMPLANT
GOWN STRL REUS W/TWL LRG LVL3 (GOWN DISPOSABLE) ×6
HEMOSTAT SURGICEL 2X3 (HEMOSTASIS) ×2 IMPLANT
HOLSTER ELECTROSUGICAL PENCIL (MISCELLANEOUS) ×2 IMPLANT
HOOK STAY BLUNT/RETRACTOR 5M (MISCELLANEOUS) IMPLANT
KIT TURNOVER KIT A (KITS) ×2 IMPLANT
LABEL OR SOLS (LABEL) ×2 IMPLANT
MANIFOLD NEPTUNE II (INSTRUMENTS) ×2 IMPLANT
NS IRRIG 500ML POUR BTL (IV SOLUTION) ×2 IMPLANT
PACK HEAD/NECK (MISCELLANEOUS) ×2 IMPLANT
PROBE NEUROSIGN BIPOL (MISCELLANEOUS) ×2 IMPLANT
PROBE NEUROSIGN BIPOLAR (MISCELLANEOUS) ×2
SHEARS HARMONIC 9CM CVD (BLADE) ×2 IMPLANT
SOL PREP PVP 2OZ (MISCELLANEOUS) ×2
SOLUTION PREP PVP 2OZ (MISCELLANEOUS) ×2 IMPLANT
SPONGE KITTNER 5P (MISCELLANEOUS) ×2 IMPLANT
STAPLER SKIN PROX 35W (STAPLE) ×2 IMPLANT
SUT SILK 2 0 (SUTURE)
SUT SILK 2 0 SH (SUTURE) ×2 IMPLANT
SUT SILK 2-0 18XBRD TIE 12 (SUTURE) ×2 IMPLANT
SUT VIC AB 4-0 RB1 18 (SUTURE) ×4 IMPLANT
TRAP FLUID SMOKE EVACUATOR (MISCELLANEOUS) ×2 IMPLANT
WATER STERILE IRR 500ML POUR (IV SOLUTION) ×2 IMPLANT

## 2023-04-30 NOTE — Plan of Care (Signed)
  Problem: Activity: Goal: Ability to tolerate increased activity will improve Outcome: Progressing   Problem: Respiratory: Goal: Will regain and/or maintain adequate ventilation Outcome: Progressing   Problem: Skin Integrity: Goal: Demonstration of wound healing without infection will improve Outcome: Progressing

## 2023-04-30 NOTE — Op Note (Signed)
04/30/2023  10:20 AM    Jaime Johnson  [id number]   Pre-Op Dx: Thyroid nodule  Post-op Dx: SAME  Proc: Total thyroidectomy; recurrent laryngeal nerve monitoring 2 and half hours  Surg:  Davina Poke  Assistant: Vought  Anes:  GOT  EBL: Less than 20 cc  Comp: None  Findings: Firm nodule right mid inferior pole adjacent to the recurrent laryngeal nerve multiple small nodules left thyroid lobe, bilateral significant fibrosis adjacent to the gland.  Procedure: Jaime Johnson was identified in the holding area taken the operating placed in supine position.  The patient was then intubated with a laryngeal nerve monitoring endotracheal tube.  This remained on and functional throughout the procedure.  With the patient intubated the neck was gently extended.  Incision line was marked just over the thyroid gland a local anesthetic of 1% lidocaine with 1 100000 use epinephrine was used to inject over the gland a total of 4 mL was used.  With the neck gently extended the neck was prepped and draped sterilely.  A 15 blade was used to incise down to and through the platysma muscle.  Hemostasis was achieved using the Bovie cautery.  The strap muscles were identified in the midline and gently divided using harmonic scalpel.  Beginning on the right-hand side the strap muscles were retracted laterally.  There was obvious significant scarring and fibrosis presumably from previous biopsy.  The muscles were retracted gently was multiple feeding vessels heading into the gland which were divided using the harmonic scalpel.  There was a firm nodule identified in the mid to inferior portion of the right lobe on the posterior surface of the gland.  The dissection then proceeded superiorly up to the upper pole.  The upper pole vessels were isolated and divided using harmonic scalpel this allowed the gland to be pulled inferiorly and medially.  Careful dissection was then proceeded around the posterior aspect of the  gland.  The superior and inferior parathyroid glands were identified and remained intact on their vascular pedicles after general dissection.  The gland was then gently retracted medially.  There was significant fibrosis adjacent to the nodule.  As the gland was retracted medially the recurrent laryngeal nerve was identified.  This stimulated and was dissected superiorly and inferiorly and then into the larynx.  This remained intact throughout the procedure.  The gland was then dissected medially this large nodule with significant fibrosis was gently dissected and freed up.  Berry's ligament was then identified and divided using the microbipolar.  The inferior pole vessels were also divided using the harmonic scalpel.  With the right lobe dissected the left side was then examined.  The strap muscles were retracted laterally again there was significant fibrosis surrounding the gland.  Gentle dissection proceeded with division of any feeding vessels using the harmonic scalpel.  The superior pole was isolated and the vessels were divided using the harmonic scalpel.  As the gland was then gently retracted medially superior and inferior parathyroid glands were identified and remained on the vascular pedicles using gentle dissection.  There were multiple feeding vessels into the gland which were divided using the harmonic scalpel.  There also multiple small firm nodules within the left lobe which also remained within the gland.  As the dissection proceeded medially the recurrent laryngeal nerve was identified in the tracheoesophageal groove.  This was stimulated and traced superiorly and inferiorly and remained intact throughout the procedure.  The gland was then retracted medially and dissected off the  anterior trachea.  Berry's ligament was released.  There were several small vessels superiorly at the pyramidal area which were divided using the harmonic scalpel.  This allowed the gland to be removed in its entirety.  A  silk stitch was placed in the left upper pole for orientation.  With the gland removed the area was copiously irrigated with saline.  Any small bleeding points were cauterized using the microbipolar.  With no active bleeding both recurrent laryngeal nerves were stimulated the end of the case and both remained intact.  Surgicel was then placed in the neurovascular beds bilaterally.  The strap muscles were reapproximated using 4-0 Vicryl.  The platysma layer was closed using 4-0 Vicryl the subcutaneous layers were closed using 4-0 Vicryl and the skin was closed using Dermabond.  The patient was returned to anesthesia where she was awakened in the operating type cart in stable condition.  Dispo:   Good  Plan: Plan for overnight admission for calcium monitoring and continued wound care.  Continue to follow while in the hospital anticipate discharge tomorrow.  Davina Poke  04/30/2023 10:20 AM

## 2023-04-30 NOTE — Anesthesia Preprocedure Evaluation (Addendum)
Anesthesia Evaluation  Patient identified by MRN, date of birth, ID band Patient awake    Reviewed: Allergy & Precautions, H&P , NPO status , Patient's Chart, lab work & pertinent test results, reviewed documented beta blocker date and time   Airway Mallampati: II  TM Distance: >3 FB Neck ROM: full    Dental  (+) Teeth Intact   Pulmonary former smoker   Pulmonary exam normal        Cardiovascular Exercise Tolerance: Poor hypertension, On Medications Normal cardiovascular exam Rhythm:regular Rate:Normal     Neuro/Psych  Neuromuscular disease  negative psych ROS   GI/Hepatic negative GI ROS, Neg liver ROS,,,  Endo/Other  negative endocrine ROS    Renal/GU negative Renal ROS  negative genitourinary   Musculoskeletal   Abdominal   Peds  Hematology  (+) Blood dyscrasia, anemia   Anesthesia Other Findings Past Medical History: No date: Allergic rhinitis due to pollen No date: Anemia 2022: COVID-19 No date: Endometrial cancer (HCC) 02/16/2020: Hyperlipidemia LDL goal <100 12/19/2016: Hypertension Past Surgical History: No date: ABDOMINAL HYSTERECTOMY No date: CESAREAN SECTION 11/12/2016: COLONOSCOPY WITH PROPOFOL; N/A     Comment:  Procedure: COLONOSCOPY WITH PROPOFOL;  Surgeon: Midge Minium, MD;  Location: [facility];  Service:               Endoscopy;  Laterality: N/A; 03/29/2017: COLONOSCOPY WITH PROPOFOL; N/A     Comment:  Procedure: COLONOSCOPY WITH PROPOFOL;  Surgeon: Midge Minium, MD;  Location: [facility];  Service:               Endoscopy;  Laterality: N/A; 03/14/2020: COLONOSCOPY WITH PROPOFOL; N/A     Comment:  Procedure: COLONOSCOPY WITH PROPOFOL;  Surgeon: Midge Minium, MD;  Location: [facility];  Service:               Endoscopy;  Laterality: N/A;  priority 4 06/01/2022: IR IMAGING GUIDED PORT INSERTION 01/17/2023: IR REMOVAL TUN  ACCESS W/ PORT W/O FL MOD SED 11/12/2016: POLYPECTOMY     Comment:  Procedure: POLYPECTOMY;  Surgeon: Midge Minium, MD;                Location: [facility];  Service: Endoscopy;; 03/29/2017: POLYPECTOMY     Comment:  Procedure: POLYPECTOMY;  Surgeon: Midge Minium, MD;                Location: [facility];  Service: Endoscopy;; BMI    Body Mass Index: 26.74 kg/m     Reproductive/Obstetrics negative OB ROS                              Anesthesia Physical Anesthesia Plan  ASA: 2  Anesthesia Plan: General ETT   Post-op Pain Management:    Induction:   PONV Risk Score and Plan: 4 or greater  Airway Management Planned:   Additional Equipment:   Intra-op Plan:   Post-operative Plan:   Informed Consent: I have reviewed the patients History and Physical, chart, labs and discussed the procedure including the risks, benefits and alternatives for the proposed anesthesia with the patient or authorized representative who has indicated his/her understanding and acceptance.     Dental Advisory Given  Plan Discussed with: CRNA  Anesthesia Plan Comments:         Anesthesia Quick Evaluation

## 2023-04-30 NOTE — Progress Notes (Signed)
04/30/2023 4:47 PM  Jaime Johnson [id number]  Post-Op Day 0    Temp:  [97 F (36.1 C)-98.2 F (36.8 C)] 97.7 F (36.5 C) (07/09 1500) Pulse Rate:  [63-72] 64 (07/09 1500) Resp:  [11-18] 18 (07/09 1500) BP: (147-177)/(77-103) 165/82 (07/09 1500) SpO2:  [91 %-100 %] 97 % (07/09 1500) Weight:  [68 kg] 68 kg (07/09 0621),     Intake/Output Summary (Last 24 hours) at 04/30/2023 1647 Last data filed at 04/30/2023 1137 Gross per 24 hour  Intake 1100 ml  Output 610 ml  Net 490 ml    Results for orders placed or performed during the hospital encounter of 04/30/23 (from the past 24 hour(s))  Calcium     Status: None   Collection Time: 04/30/23 10:38 AM  Result Value Ref Range   Calcium 9.0 8.9 - 10.3 mg/dL  Calcium     Status: Abnormal   Collection Time: 04/30/23  1:51 PM  Result Value Ref Range   Calcium 8.8 (L) 8.9 - 10.3 mg/dL    SUBJECTIVE: Feeling good has been up walking around has had something to drink overall feeling great minimal pain  OBJECTIVE: Neck is flat no evidence of hematoma, mild hoarseness not unexpected after the surgery  IMPRESSION: That is post total thyroidectomy stable her calcium was 9 this morning was 8.8 this afternoon will recheck in the morning  PLAN: Status post total thyroidectomy assuming her calcium remains stable anticipate discharge in the morning advised to get out of bed and ambulate is much as possible.  Davina Poke 04/30/2023, 4:47 PM

## 2023-04-30 NOTE — Anesthesia Procedure Notes (Signed)
Procedure Name: Intubation Date/Time: 04/30/2023 7:47 AM  Performed by: Lynden Oxford, CRNAPre-anesthesia Checklist: Patient identified, Emergency Drugs available, Suction available and Patient being monitored Patient Re-evaluated:Patient Re-evaluated prior to induction Oxygen Delivery Method: Circle system utilized Preoxygenation: Pre-oxygenation with 100% oxygen Induction Type: IV induction Ventilation: Mask ventilation without difficulty Laryngoscope Size: Glidescope and 3 Grade View: Grade I Tube type: Oral Tube size: 6.5 mm Number of attempts: 1 Airway Equipment and Method: Stylet and Video-laryngoscopy (laryngeal nerve monitoring attached to tube as per device rep's instructions) Placement Confirmation: ETT inserted through vocal cords under direct vision, positive ETCO2 and breath sounds checked- equal and bilateral Secured at: 21 cm Tube secured with: Tape Dental Injury: Teeth and Oropharynx as per pre-operative assessment  Comments: Laryngeal nerve monitor adhered to tube per device rep instructions.  Placement of nerve monitor electrode confirmed by Dr. Jenne Campus during intubation.

## 2023-04-30 NOTE — H&P (Signed)
The patient's history has been reviewed, patient examined, no change in status, stable for surgery.  Questions were answered to the patients satisfaction.  

## 2023-04-30 NOTE — Transfer of Care (Signed)
Immediate Anesthesia Transfer of Care Note  Patient: Jaime Johnson  Procedure(s) Performed: THYROIDECTOMY (Throat) CONTINUOUS NERVE MONITORING  Patient Location: PACU  Anesthesia Type:General  Level of Consciousness: drowsy and patient cooperative  Airway & Oxygen Therapy: Patient Spontanous Breathing and Patient connected to face mask oxygen  Post-op Assessment: Report given to RN and Post -op Vital signs reviewed and stable  Post vital signs: Reviewed and stable  Last Vitals:  Vitals Value Taken Time  BP 173/103 04/30/23 1015  Temp    Pulse 66 04/30/23 1019  Resp 16 04/30/23 1019  SpO2 100 % 04/30/23 1019  Vitals shown include unvalidated device data.  Last Pain:  Vitals:   04/30/23 0621  TempSrc: Temporal  PainSc: 0-No pain         Complications: No notable events documented.

## 2023-05-01 ENCOUNTER — Encounter: Payer: Self-pay | Admitting: Unknown Physician Specialty

## 2023-05-01 LAB — CALCIUM
Calcium: 8.5 mg/dL — ABNORMAL LOW (ref 8.9–10.3)
Calcium: 8.2 mg/dL — ABNORMAL LOW (ref 8.9–10.3)

## 2023-05-01 MED ORDER — HYDROCODONE-ACETAMINOPHEN 5-325 MG PO TABS
ORAL_TABLET | ORAL | Status: AC
  Filled 2023-05-01: qty 1

## 2023-05-01 MED ORDER — CALCIUM CARBONATE 1250 (500 CA) MG PO TABS
1000.0000 mg | ORAL_TABLET | Freq: Three times a day (TID) | ORAL | Status: DC
  Administered 2023-05-01 – 2023-05-02 (×3): 2500 mg via ORAL
  Filled 2023-05-01 (×4): qty 2

## 2023-05-01 MED ORDER — LOSARTAN POTASSIUM 50 MG PO TABS
ORAL_TABLET | ORAL | Status: AC
  Filled 2023-05-01: qty 2

## 2023-05-01 MED ORDER — OYSTER SHELL CALCIUM/D3 500-5 MG-MCG PO TABS
2.0000 | ORAL_TABLET | Freq: Two times a day (BID) | ORAL | Status: DC
  Administered 2023-05-01: 2 via ORAL
  Filled 2023-05-01: qty 2

## 2023-05-01 NOTE — Progress Notes (Signed)
05/01/2023 8:07 AM  Jaime Johnson [id number]  Post-Op Day 1    Temp:  [97 F (36.1 C)-98.2 F (36.8 C)] 97.5 F (36.4 C) (07/10 0524) Pulse Rate:  [63-83] 75 (07/10 0524) Resp:  [11-18] 16 (07/10 0524) BP: (143-177)/(72-103) 145/82 (07/10 0524) SpO2:  [91 %-100 %] 96 % (07/10 0524),     Intake/Output Summary (Last 24 hours) at 05/01/2023 0807 Last data filed at 04/30/2023 1137 Gross per 24 hour  Intake 1100 ml  Output 610 ml  Net 490 ml    Results for orders placed or performed during the hospital encounter of 04/30/23 (from the past 24 hour(s))  Calcium     Status: None   Collection Time: 04/30/23 10:38 AM  Result Value Ref Range   Calcium 9.0 8.9 - 10.3 mg/dL  Calcium     Status: Abnormal   Collection Time: 04/30/23  1:51 PM  Result Value Ref Range   Calcium 8.8 (L) 8.9 - 10.3 mg/dL  Calcium     Status: Abnormal   Collection Time: 05/01/23  6:28 AM  Result Value Ref Range   Calcium 8.5 (L) 8.9 - 10.3 mg/dL    SUBJECTIVE: Feels great this morning, no complaints pain well-controlled.  OBJECTIVE: Neck smooth and soft, no evidence of hematoma, voice premorbid.  IMPRESSION: Status post total thyroidectomy.  Calcium this morning 8.5, down from 8.8 yesterday afternoon.  PLAN: Will advance to regular diet, will repeat calcium around 1 PM if stable will discharge to home if continues to drop we will monitor.  Patient is in agreement.  Davina Poke 05/01/2023, 8:07 AM

## 2023-05-02 ENCOUNTER — Encounter: Payer: Self-pay | Admitting: Occupational Therapy

## 2023-05-02 LAB — CALCIUM
Calcium: 7.9 mg/dL — ABNORMAL LOW (ref 8.9–10.3)
Calcium: 7.9 mg/dL — ABNORMAL LOW (ref 8.9–10.3)

## 2023-05-02 MED ORDER — HYDROCODONE-ACETAMINOPHEN 5-325 MG PO TABS
ORAL_TABLET | ORAL | Status: AC
  Filled 2023-05-02: qty 1

## 2023-05-02 MED ORDER — CALCIUM CARBONATE 1250 (500 CA) MG PO TABS: 2500.0000 mg | ORAL_TABLET | Freq: Three times a day (TID) | ORAL | 4 refills | Status: DC

## 2023-05-02 MED ORDER — CALCITRIOL 0.25 MCG PO CAPS: 0.2500 ug | ORAL_CAPSULE | Freq: Every day | ORAL | 1 refills | Status: DC

## 2023-05-02 MED ORDER — CALCITRIOL 0.25 MCG PO CAPS
0.2500 ug | ORAL_CAPSULE | Freq: Every day | ORAL | Status: DC
  Administered 2023-05-02: 0.25 ug via ORAL
  Filled 2023-05-02: qty 1

## 2023-05-02 NOTE — Progress Notes (Signed)
05/02/2023 8:21 AM  Jaime Johnson [id number]  Post-Op Day 2    Temp:  [97.6 F (36.4 C)-98.2 F (36.8 C)] 97.7 F (36.5 C) (07/11 0802) Pulse Rate:  [59-74] 59 (07/11 0802) Resp:  [15-17] 17 (07/11 0802) BP: (112-146)/(77-88) 132/84 (07/11 0802) SpO2:  [96 %-99 %] 99 % (07/11 0802),    No intake or output data in the 24 hours ending 05/02/23 4098  Results for orders placed or performed during the hospital encounter of 04/30/23 (from the past 24 hour(s))  Calcium     Status: Abnormal   Collection Time: 05/01/23  1:08 PM  Result Value Ref Range   Calcium 8.2 (L) 8.9 - 10.3 mg/dL  Calcium     Status: Abnormal   Collection Time: 05/02/23  7:18 AM  Result Value Ref Range   Calcium 7.9 (L) 8.9 - 10.3 mg/dL    SUBJECTIVE: Patient without complaints this morning, when asked about any possible tingling around the mouth she did not complain of that has had some mild tingling in her hands but not severe.  OBJECTIVE: Incision remains clean and dry without evidence of hematoma  IMPRESSION: Status post thyroidectomy  PLAN: Patient is feeling fine, her calcium is 7.9 this morning down from 8.2 yesterday afternoon.  We have increased her elemental calcium dose with vitamin D.  This was done yesterday and she has only had 2 doses so far.  I had a long discussion with the family today, I will discuss with her endocrinologist about possibly starting Rocaltrol.  We will continue to monitor her calcium level we will repeat a calcium level around 2 PM after her lunchtime dose of calcium.  Patient and husband are in agreement, I do not want to send her home until her calcium has stabilized.  Davina Poke 05/02/2023, 8:21 AM

## 2023-05-02 NOTE — Plan of Care (Signed)
  Problem: Education: Goal: Knowledge of the prescribed therapeutic regimen will improve Outcome: Progressing   Problem: Activity: Goal: Ability to tolerate increased activity will improve Outcome: Progressing   Problem: Skin Integrity: Goal: Demonstration of wound healing without infection will improve Outcome: Progressing

## 2023-05-02 NOTE — Discharge Summary (Signed)
05/02/2023 4:04 PM  Jaime Johnson [id number]  Post-Op Day 2    Temp:  [97.1 F (36.2 C)-98.2 F (36.8 C)] 97.1 F (36.2 C) (07/11 1157) Pulse Rate:  [59-74] 68 (07/11 1157) Resp:  [16-17] 17 (07/11 1157) BP: (132-144)/(77-88) 144/80 (07/11 1157) SpO2:  [97 %-99 %] 97 % (07/11 1157),    No intake or output data in the 24 hours ending 05/02/23 1604  Results for orders placed or performed during the hospital encounter of 04/30/23 (from the past 24 hour(s))  Calcium     Status: Abnormal   Collection Time: 05/02/23  7:18 AM  Result Value Ref Range   Calcium 7.9 (L) 8.9 - 10.3 mg/dL  Calcium     Status: Abnormal   Collection Time: 05/02/23  2:01 PM  Result Value Ref Range   Calcium 7.9 (L) 8.9 - 10.3 mg/dL    SUBJECTIVE:  No changes, no worsening tingling sensations  OBJECTIVE:  Wound healing  IMPRESSION:  Calcium this afternoon same as the am at 7.9 so appears to be stabilizing.  I spoke with her endocrinologist this am about her, she recommended starting rocaltrol which we have done.  [name] knows to watch for any perioral tingling or tingling/numbness in hands.  If this occurs she will come to ED immediately.  She will continue meds at home. I will see her back next week, and Dr. Standley Brooking in endocrine will see her as well to follow her labs.  Routine wound care  PLAN:  DC to home, will recheck next week.    Jaime Johnson 05/02/2023, 4:04 PM

## 2023-05-02 NOTE — Anesthesia Postprocedure Evaluation (Signed)
Anesthesia Post Note  Patient: Jaime Johnson  Procedure(s) Performed: THYROIDECTOMY (Throat) CONTINUOUS NERVE MONITORING  Patient location during evaluation: PACU Anesthesia Type: General Level of consciousness: awake and alert Pain management: pain level controlled Vital Signs Assessment: post-procedure vital signs reviewed and stable Respiratory status: spontaneous breathing, nonlabored ventilation, respiratory function stable and patient connected to nasal cannula oxygen Cardiovascular status: blood pressure returned to baseline and stable Postop Assessment: no apparent nausea or vomiting Anesthetic complications: no   No notable events documented.   Last Vitals:  Vitals:   05/01/23 2143 05/02/23 0328  BP: (!) 141/77 132/88  Pulse: 74 66  Resp: 16 16  Temp: 36.8 C 36.4 C  SpO2: 98% 97%    Last Pain:  Vitals:   05/02/23 0427  TempSrc:   PainSc: Asleep                 Yevette Edwards

## 2023-05-02 NOTE — Plan of Care (Signed)
  Problem: Activity: Goal: Ability to tolerate increased activity will improve Outcome: Progressing   Problem: Nutrition: Goal: Maintenance of adequate nutrition will improve Outcome: Progressing   Problem: Skin Integrity: Goal: Demonstration of wound healing without infection will improve Outcome: Progressing

## 2023-05-02 NOTE — Progress Notes (Signed)
DISCHARGE NOTE:  Pt and husband given discharge instructions and verbalized understanding. Pt wheeled to car by staff. Husband providing transportation.   (Dr. Jenne Campus made aware endocrinology appt was scheduled for 7/22, as Dr. Standley Brooking will be out of the office until then.)

## 2023-05-03 ENCOUNTER — Encounter: Payer: Self-pay | Admitting: Physical Therapy

## 2023-05-06 ENCOUNTER — Telehealth: Payer: Self-pay

## 2023-05-06 NOTE — Transitions of Care (Post Inpatient/ED Visit) (Unsigned)
   05/06/2023  Name: Jaime Johnson MRN: [id number] DOB: [birth year]  Today's TOC FU Call Status: Today's TOC FU Call Status:: Unsuccessul Call (1st Attempt) Unsuccessful Call (1st Attempt) Date: 05/06/23  Attempted to reach the patient regarding the most recent Inpatient/ED visit.  Follow Up Plan: Additional outreach attempts will be made to reach the patient to complete the Transitions of Care (Post Inpatient/ED visit) call.   Signature   Woodfin Ganja LPN [facility] Nurse Health Advisor Direct Dial [phone]

## 2023-05-07 ENCOUNTER — Encounter: Payer: Self-pay | Admitting: Occupational Therapy

## 2023-05-07 NOTE — Transitions of Care (Post Inpatient/ED Visit) (Signed)
   05/07/2023  Name: Jaime Johnson MRN: [id number] DOB: [birth year]  Today's TOC FU Call Status: Today's TOC FU Call Status:: Unsuccessful Call (2nd Attempt) Unsuccessful Call (1st Attempt) Date: 05/06/23 Unsuccessful Call (2nd Attempt) Date: 05/07/23  Attempted to reach the patient regarding the most recent Inpatient/ED visit.  Follow Up Plan: No further outreach attempts will be made at this time. We have been unable to contact the patient.  Signature   Woodfin Ganja LPN [facility] Nurse Health Advisor Direct Dial [phone]

## 2023-05-09 ENCOUNTER — Encounter: Payer: Self-pay | Admitting: Occupational Therapy

## 2023-05-13 DIAGNOSIS — E890 Postprocedural hypothyroidism: Secondary | ICD-10-CM | POA: Insufficient documentation

## 2023-05-14 ENCOUNTER — Encounter: Payer: Self-pay | Admitting: Occupational Therapy

## 2023-05-16 ENCOUNTER — Encounter: Payer: Self-pay | Admitting: Occupational Therapy

## 2023-05-17 ENCOUNTER — Encounter: Payer: Self-pay | Admitting: Physical Therapy

## 2023-05-21 ENCOUNTER — Encounter: Payer: Self-pay | Admitting: Occupational Therapy

## 2023-05-23 ENCOUNTER — Encounter: Payer: Self-pay | Admitting: Occupational Therapy

## 2023-05-27 ENCOUNTER — Encounter: Payer: Self-pay | Admitting: Oncology

## 2023-05-27 DIAGNOSIS — E890 Postprocedural hypothyroidism: Secondary | ICD-10-CM | POA: Diagnosis not present

## 2023-05-27 DIAGNOSIS — C73 Malignant neoplasm of thyroid gland: Secondary | ICD-10-CM | POA: Diagnosis not present

## 2023-05-28 ENCOUNTER — Encounter: Payer: Self-pay | Admitting: Occupational Therapy

## 2023-05-29 ENCOUNTER — Encounter: Payer: Self-pay | Admitting: Obstetrics and Gynecology

## 2023-05-29 ENCOUNTER — Inpatient Hospital Stay: Payer: PPO | Attending: Oncology | Admitting: Obstetrics and Gynecology

## 2023-05-29 VITALS — BP 142/82 | HR 69 | Temp 94.8°F | Resp 18 | Wt 152.6 lb

## 2023-05-29 DIAGNOSIS — Z08 Encounter for follow-up examination after completed treatment for malignant neoplasm: Secondary | ICD-10-CM | POA: Diagnosis not present

## 2023-05-29 DIAGNOSIS — Z90710 Acquired absence of both cervix and uterus: Secondary | ICD-10-CM | POA: Insufficient documentation

## 2023-05-29 DIAGNOSIS — Z85850 Personal history of malignant neoplasm of thyroid: Secondary | ICD-10-CM | POA: Diagnosis not present

## 2023-05-29 DIAGNOSIS — R140 Abdominal distension (gaseous): Secondary | ICD-10-CM | POA: Diagnosis not present

## 2023-05-29 DIAGNOSIS — Z90722 Acquired absence of ovaries, bilateral: Secondary | ICD-10-CM | POA: Diagnosis not present

## 2023-05-29 DIAGNOSIS — N952 Postmenopausal atrophic vaginitis: Secondary | ICD-10-CM | POA: Insufficient documentation

## 2023-05-29 DIAGNOSIS — Z8542 Personal history of malignant neoplasm of other parts of uterus: Secondary | ICD-10-CM | POA: Diagnosis not present

## 2023-05-29 DIAGNOSIS — Z923 Personal history of irradiation: Secondary | ICD-10-CM | POA: Diagnosis not present

## 2023-05-29 DIAGNOSIS — E890 Postprocedural hypothyroidism: Secondary | ICD-10-CM | POA: Insufficient documentation

## 2023-05-29 DIAGNOSIS — C541 Malignant neoplasm of endometrium: Secondary | ICD-10-CM

## 2023-05-29 DIAGNOSIS — Z9221 Personal history of antineoplastic chemotherapy: Secondary | ICD-10-CM | POA: Insufficient documentation

## 2023-05-29 MED ORDER — BUPROPION HCL ER (XL) 150 MG PO TB24: 150.0000 mg | ORAL_TABLET | Freq: Every day | ORAL | 1 refills | Status: DC

## 2023-05-29 NOTE — Patient Instructions (Signed)
It was great to see you today. I've sent a prescription for the cream for you to the compounding pharmacy. You can purchase vaginal moisturizers with hyaluronic acid online. Use those nightly to 3 nights a week to help with vaginal dryness. I recommend purchasing a lubricant such as Slippery Stuff/KY jelly/etc. You will use these for dilator treatments and/or sexual activity. I've sent a prescription for wellbutrin/bupropion to your pharmacy. I'll see you back in roughly 6 weeks but can always see you back sooner if needed. You're doing great! Please let me know if you have questions or concerns.  Leotis Shames, NP

## 2023-05-29 NOTE — Progress Notes (Addendum)
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 G68P1 female who is seen in consultation from Dr. Smith Robert for endometrial cancer s/p RA-TLH-BSO with SLND and perineal repair at [facility] on 03/31/22 who presents for surveillance.  She last saw Dr. Smith Robert on 03/05/2023 and had a reassuring exam and imaging except for the thyroid nodule.   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  Gyn Oncology History She presented as 62 year old female with PMB to gyn. Biopsy showed endometrial cancer and was referred to Dr. Tresa Endo from [facility]. She underwent RA-TLH-BSo on 03/30/2022.   Final Pathologic Diagnosis  A. SENTINEL LYMPH NODE, RIGHT EXTERNAL ILIAC ARTERY, RESECTION: Two lymph nodes, negative for metastatic carcinoma (0/2).    B. SENTINEL LYMPH NODE, LEFT EXTERNAL ILIAC ARTERY, RESECTION: One lymph node, negative for metastatic carcinoma (0/1).    C. UTERUS, FALLOPIAN TUBE AND OVARIES, HYSTERECTOMY WITH BILATERAL SALPINGO-OOPHORECTOMY: Endometrioid adenocarcinoma, FIGO grade 2. Tumor measures 5 cm in greatest dimension.  Tumor invades 14 of 17 mm thick myometrium (greater than 50%).  Margins of resection are uninvolved by tumor.  Tumor involves lower uterine segment.  Cervix is uninvolved by tumor.  Benign right fallopian tube and ovary.  Left ovary with tumor located within lymphovascular spaces, no tissue invasion identified.  Left fallopian tube with tumor within  lymphovascular spaces, no tissue invasion identified. Free floating tumor located inside left fallopian tube.  pT1b pN0   Surgery was complicated by vaginal laceration/episiotomy. During the repair, the tip of the needle broke off. Xray was performed and confirmed 'curvilinear suture needle projects over the left labial tissue/left perineum'. Needle was located using fluoroscopy and was removed in its entirety and incision was then repaired. Surgery was prolonged due to this with estimated time of ~ 7 hours.   Post op course was then complicated by fever requiring hospitalization for another week.   Dr. Tresa Endo saw patient, recommended adjuvant chemotherapy with Carbo Taxol for 3 cycles followed by vaginal brachytherapy.  Patient transferred care to [facility] and has been followed by Dr. Smith Robert with medical oncology. 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 She is not a diabetic. She has baseline rheumatoid arthritis   Pathology was also second for a second opinion to [facility] as well.  Path report suggested that this was a T3a tumor FIGO stage IIIa disease. Left ovary and fallopian tube: Positive for carcinoma, lymphovascular involvement. Microcystic elongated fragmented (MELF) pattern of invasion and associated lymphovascular invasion.    1. Right external iliac artery sentinel lymph node, lymphadenectomy:   Two lymph nodes, negative for malignancy (0/2).   2.  Left external iliac artery, sentinel lymph node, lymphadenectomy:   One lymph node, suspicious for isolated tumor cells, see comment.   Comment: The paraffin block was requested for keratin immunohistochemistry, but the referring institution declined the request.  Since we were unable to obtain material for immunohistochemistry, this node will be classified as negative for synoptic reporting purposes and staging.     MLH1: Retained expression (wild-type). PMS2:  Retained expression (wild-type). MSH2: Retained expression  (wild-type). MSH6: Retained expression (wild-type).  P53 immunohistochemistry:   Wild type pattern (stain performed at [facility] on outside block C5).   PET 06/05/22 IMPRESSION: 1. Status post hysterectomy without suspicious hypermetabolic soft tissue nodularity along the vaginal cuff to suggest residual disease. 2. No convincing evidence of hypermetabolic metastatic disease. 3. Mild mesenteric and omental stranding without abnormal FDG avid nodularity possibly reflecting postsurgical change, suggest attention on follow-up imaging. 4. Incidental 1.4 cm right thyroid nodule with slightly increased uptake. Recommend nonemergent thyroid US and biopsy of nodule if detected. Reference: J Am Coll Radiol. 2015 Feb;12(2): 143-50 5. Slightly increased background thyroid activity is nonspecific but may reflect thyroiditis recommend correlation with laboratory values. 6.  Aortic Atherosclerosis (ICD10-I70.0).  Received 3 cycles of CarboTaxol chemotherapy by Dr. Smith Robert. She requires G-CSF for leukopenia.  10/23 CT scan  shows no evidence of disease.  [facility] Tumor Board recommended completion of 6 cycles of carboplatin/paclitaxel followed by repeat PET versus CT scan to assess status of vaginal nodularity. If nodularity present and concerning for persistent disease (FDG avid or biopsy-proven) could either treat with EBRT + VBT or surgical resection followed by VBT. Imaging findings could be postoperative in nature given proximity to surgery.   10/08/22 6 cycles of carbo-taxol completed  10/29/22  vaginal brachytherapy completed   01/07/23 CT C/A/P IMPRESSION: No developing new mass lesion, fluid collection or lymph node enlargement. The areas of stranding in the mesentery are less visible today. No new areas of nodularity or ascites.   Fatty liver infiltration.  Gallstones.  Hepatic hemangioma.   Stable left adrenal nodule when adjusted for variances in technique.     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 MMR intact.   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   Hyperlipidemia LDL goal <100 02/16/2020   Cervical radicular pain (RIGHT) 09/29/2019   Bilateral carpal tunnel syndrome 09/29/2019   Essential hypertension 12/19/2016   Allergic rhinitis due to pollen     Past Medical History: Past Medical History:  Diagnosis Date   Allergic rhinitis due to pollen    Anemia    COVID-19 2022   Endometrial cancer (HCC)    Hyperlipidemia LDL goal <100 02/16/2020   Hypertension 12/19/2016    Past Surgical History: Past Surgical History:  Procedure Laterality Date   ABDOMINAL HYSTERECTOMY     CESAREAN SECTION     COLONOSCOPY WITH PROPOFOL N/A 11/12/2016   Procedure: COLONOSCOPY WITH PROPOFOL;  Surgeon: Midge Minium, MD;  Location: [facility];  Service: Endoscopy;  Laterality: N/A;   COLONOSCOPY WITH PROPOFOL N/A 03/29/2017   Procedure: COLONOSCOPY WITH PROPOFOL;  Surgeon: Midge Minium, MD;  Location: [facility];  Service: Endoscopy;  Laterality: N/A;   COLONOSCOPY WITH PROPOFOL N/A 03/14/2020   Procedure: COLONOSCOPY WITH PROPOFOL;  Surgeon: Midge Minium, MD;  Location: [facility];  Service: Endoscopy;  Laterality: N/A;  priority 4   CONTINUOUS NERVE  MONITORING N/A 04/30/2023   Procedure: CONTINUOUS NERVE MONITORING;  Surgeon: Linus Salmons, MD;  Location: [facility];  Service: ENT;  Laterality: N/A;   IR IMAGING GUIDED PORT INSERTION  06/01/2022   IR REMOVAL TUN ACCESS W/ PORT W/O FL MOD SED  01/17/2023   POLYPECTOMY  11/12/2016   Procedure: POLYPECTOMY;  Surgeon: Midge Minium, MD;  Location: [facility];  Service: Endoscopy;;   POLYPECTOMY  03/29/2017   Procedure: POLYPECTOMY;  Surgeon: Midge Minium, MD;  Location: [facility];  Service: Endoscopy;;   THYROIDECTOMY N/A 04/30/2023   Procedure: THYROIDECTOMY;  Surgeon: Linus Salmons, MD;  Location: [facility];  Service: ENT;  Laterality: N/A;   Past Gynecologic History:  Menarche: age 35 Last pap: per hpi Post menopausal Sexually active.  Pap- 06/30/2009- NILM. HPV not performed 02/25/2007- ASCUS  OB History:  OB History  Gravida Para Term Preterm AB Living  1 1 1     1   SAB IAB Ectopic Multiple Live Births               # Outcome Date GA Lbr Len/2nd Weight Sex Type Anes PTL Lv  1 Term             Family History: Family History  Problem Relation Age of Onset   Hepatitis C Mother 46       d/c at 73   Hypertension Father    Hepatitis C Brother    Breast cancer Neg Hx     Social History: Social History   Socioeconomic History   Marital status: Married    Spouse name: Not on file   Number of children: Not on file   Years of education: Not on file   Highest education level: Not on file  Occupational History   Not on file  Tobacco Use   Smoking status: Former    Current packs/day: 0.00    Types: Cigarettes    Quit date: 10/22/1978    Years since quitting: 44.6   Smokeless tobacco: Never  Vaping Use   Vaping status: Never Used  Substance and Sexual Activity   Alcohol use: No   Drug use: No   Sexual activity: Yes    Partners: Male  Other Topics Concern   Not on file  Social History Narrative   Not on file   Social Determinants of Health    Financial Resource Strain: Patient Declined (04/16/2023)   Received from [facility], [facility]   Overall Financial Resource Strain (CARDIA)    Difficulty of Paying Living Expenses: Patient declined  Food Insecurity: No Food Insecurity (04/30/2023)   Hunger Vital Sign    Worried About Running Out of Food in the Last Year: Never true    Ran Out of Food in the Last Year: Never true  Transportation Needs: No Transportation Needs (04/30/2023)   PRAPARE - Administrator, Civil Service (Medical): No    Lack of Transportation (Non-Medical): No  Physical Activity: Inactive (06/22/2022)   Exercise Vital Sign    Days of Exercise per Week: 0 days    Minutes of Exercise per Session: 0 min  Stress: Stress Concern Present (06/22/2022)   [employer] of Occupational Health - Occupational Stress Questionnaire    Feeling of Stress : To some extent  Social Connections: Socially Integrated (06/22/2022)   Social Connection and Isolation Panel [NHANES]    Frequency of  Communication with Friends and Family: Three times a week    Frequency of Social Gatherings with Friends and Family: Three times a week    Attends Religious Services: 1 to 4 times per year    Active Member of Clubs or Organizations: No    Attends Banker Meetings: 1 to 4 times per year    Marital Status: Married  Catering manager Violence: Not At Risk (04/30/2023)   Humiliation, Afraid, Rape, and Kick questionnaire    Fear of Current or Ex-Partner: No    Emotionally Abused: No    Physically Abused: No    Sexually Abused: No    Allergies: Allergies  Allergen Reactions   Azithromycin Other (See Comments)    Gets a bad yeast infection from this.   Wound Dressing Adhesive Rash    bandaid    Current Medications: Current Outpatient Medications  Medication Sig Dispense Refill   acetaminophen (TYLENOL) 500 MG tablet Take 500 mg by mouth daily as needed.     amoxicillin  (AMOXIL) 500 MG capsule Take 500 mg by mouth 3 (three) times daily.     calcitRIOL (ROCALTROL) 0.25 MCG capsule Take 1 capsule (0.25 mcg total) by mouth daily. 60 capsule 1   calcium carbonate (OS-CAL - DOSED IN MG OF ELEMENTAL CALCIUM) 1250 (500 Ca) MG tablet Take 2 tablets (2,500 mg total) by mouth 3 (three) times daily with meals. 90 tablet 4   levothyroxine (SYNTHROID) 100 MCG tablet Take 100 mcg by mouth daily.     losartan-hydrochlorothiazide (HYZAAR) 100-12.5 MG tablet Take 1 tablet by mouth daily.     Multiple Vitamins-Minerals (MULTIVITAMIN GUMMIES ADULTS PO) Take by mouth daily.     traMADol (ULTRAM) 50 MG tablet TAKE 1 TABLET(50 MG) BY MOUTH DAILY 30 tablet 3   No current facility-administered medications for this visit.   Review of systems General: Fatigue and weakness is gotten worse after her thyroid surgery  HEENT: no complaints  Lungs: Cough  Cardiac: no complaints  GI: Abdominal bloating and discomfort o/wno complaints  GU: no complaints  Musculoskeletal: Chronic back pain o/wno complaints  Extremities: Left lower extremity leg swelling which is chronic  Skin: no complaints  Neuro: Peripheral neuropathy since chemotherapy  Endocrine: no complaints  Psych: no complaints      She states her thyroid functions were abnormal and she has gotten started on a higher dose of her medication.  Suspect that her general symptoms are due to thyroid placement therapy issues. She is interested in having sexual activity.  She has not had sex since her diagnosis in May 2023.  Objective:  Physical Examination:  BP (!) 142/82 (BP Location: Left Arm, Patient Position: Sitting, Cuff Size: Large)   Pulse 69   Temp (!) 94.8 F (34.9 C) (Tympanic)   Resp 18   Wt 152 lb 9.6 oz (69.2 kg)   LMP 02/29/2020 (Approximate) Comment: Has had random spotting; hadn't had a period 1.5 yrs prior  SpO2 100%   BMI 27.20 kg/m    ECOG Performance Status: 1 - Symptomatic but completely  ambulatory  GENERAL: Patient is a well appearing female in no acute distress HEENT:  PERRL, neck supple with midline trachea. Thyroidectomy incision healing well  NODES:  No cervical, supraclavicular, axillary, or inguinal lymphadenopathy palpated.  LUNGS:  Clear to auscultation bilaterally.  No wheezes or rhonchi. ABDOMEN:  Soft, nontender.  No masses or ascites or hernias.  Significant distention MSK:  No focal spinal tenderness to palpation.  EXTREMITIES:  No significant peripheral edema.    NEURO:  Nonfocal. Well oriented.  Appropriate affect.  Pelvic: Chaperoned by CMA EGBUS: no lesions Cervix: Surgically  absent Vagina: no lesions, no discharge or bleeding.  Vaginal atrophy. Slight narrowing of the introitus  Adequate vaginal length.  Uterus: Surgically absent BME: no palpable masses  Dilators used and demonstrated insertion.  She had a hard time tolerating the medium dilator.  The small dilator was able to be inserted easily   Lab . Review N/A  Radiologic Imaging: As per HPI     Assessment:  Jaime Johnson is a 62 y.o. female diagnosed with Stage IIIA (pMMR; p53wt), grade 2 endometrioid endometrial cancer.  Underwent robotic TLH/BSO SLN mapping and biopsies 03/30/22 at [facility].  Tumor measures 5 cm in greatest dimension and invades 14 of 17 mm thick myometrium (greater than 50%), cervix is uninvolved by tumor, Microcystic elongated fragmented (MELF) pattern of invasion and associated lymphovascular invasion.  Left ovary with tumor located within lymphovascular spaces, no tissue invasion identified. Left fallopian tube with tumor within lymphovascular spaces, no tissue invasion identified. Free floating tumor located inside left fallopian tube. Left external iliac artery SLN suspicious for isolated tumor cells.    PET scan 8/23 with some questionable vaginal nodularity vs post op changes.  Completed 6 cycles of CarboTaxol chemotherapy and vaginal brachytherapy 1/24.  CT scan  negative 10/23.  Vaginal exam normal on exam today. NED  Abdominal discomfort and bloating concerning for recurrence.  Will obtain repeat imaging.  Desired sexual function.  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 Body mass index is 27.2 kg/m.   Plan:   Problem List Items Addressed This Visit       Genitourinary   Endometrial carcinoma (HCC) - Primary (Chronic)   Relevant Medications   amoxicillin (AMOXIL) 500 MG capsule   Other Relevant Orders   CT CHEST ABDOMEN PELVIS W CONTRAST   Other Visit Diagnoses     Abdominal bloating       Vaginal atrophy            Recommend CT scan C/A/P to follow up adrenal nodules, cough, and abdominal symptoms.  If CT scan negative she will continue surveillance with Dr. Smith Robert with alternating follow up every three months, as she has a significant risk of recurence.  She will see Dr Smith Robert in 3 months and Gyn Onc in 6 months.  If her CT scan is positive we will determine subsequent management.  Sexual function counseling was provided by Consuello Masse.  We discussed the use of vaginal lubricants and moisturizers as well as dilator therapy.   Vaginal dryness & atrophy. We reviewed the clinical manifestations of vaginal dryness including pain, pulling, burning, tenderness. Symptoms are often related to hypoestrogen state or may occur secondary to treatments for malignancy. We reviewed the differences in vaginal moisturizers and lubricants and that patients will need both.  Vaginal moisturizers are used inside the vagina, at night when patient is laying down. The gel or suppository warms and soaks into the tissue of the vagina providing long lasting moisture. Most patients use these 3 times a week but this can be adjusted up or down based on  personal need. I recommend products that contain hyaluronic acid which is nonhormonal and has been shown to have similar efficacy to estrogen based creams.  Patient may require use of these products lifelong given her postmenopausal state.  Vaginal lubricants are for sexual activity and dilator exercises. Lubricants, unlike moisturizers, make things slippery and don't absorb into the tissue. Moisturizers do absorb. Water based lubricants tend to be less irritating and are compatible with condoms. Silicone lubricants are typically more slippery but harder for vagina to clear & therefore irritating. She has tried coconut oil but did not like the smell so we discussed alternatives and can adjust based on pH if not tolerating.   Dyspareunia- Patients with anatomic changes to vagina post surgery and radiation will benefit from dilator therapy. We provided her with a dilator today and reviewed use. She's primarily bothered by stimulation during intercourse and therefore I recommend trial of topical preparation of sildenafil/scream cream which is off label compounded medication that can help vasodilate, improve blood flow prior to sexual contact. Will send prescription to [facility] pharmacy.   Libido issues- exercise works best and is likely tied to physical, emotional, and mental health. Recommend cognitive behavioral therapy. Given fatigue and depression will also start Bupropion XL 150 mg daily. Can uptitrate to 300 mg at next visit if tolerating.   She will follow-up with her other care providers regarding her thyroid replacement therapy.  The patient's diagnosis, an outline of the further diagnostic and laboratory studies which will be required, the recommendation, and alternatives were discussed.  All questions were answered to the patient's satisfaction.  I personally had a face to face interaction and evaluated the patient jointly with the NP, Ms. Consuello Masse.  I have reviewed her history and available  records and have performed all portions of the physical exam documented above by me.  I have discussed the case with the APP and the patient and she will provide sexual function counseling.   I agree with the above documentation, assessment and plan which was fully formulated by me.  Counseling was completed by me.   I personally saw the patient and performed this encounter in conjunction with the listed APP as documented above.   Leta Jungling, MD

## 2023-05-30 ENCOUNTER — Encounter: Payer: Self-pay | Admitting: Occupational Therapy

## 2023-06-04 ENCOUNTER — Encounter: Payer: Self-pay | Admitting: Occupational Therapy

## 2023-06-05 ENCOUNTER — Ambulatory Visit
Admission: RE | Admit: 2023-06-05 | Discharge: 2023-06-05 | Disposition: A | Discharge status: Home / Self Care | Payer: PPO | Source: Ambulatory Visit | Attending: Obstetrics and Gynecology | Admitting: Obstetrics and Gynecology

## 2023-06-05 DIAGNOSIS — I700 Atherosclerosis of aorta: Secondary | ICD-10-CM | POA: Diagnosis not present

## 2023-06-05 DIAGNOSIS — Z8542 Personal history of malignant neoplasm of other parts of uterus: Secondary | ICD-10-CM | POA: Diagnosis not present

## 2023-06-05 DIAGNOSIS — C541 Malignant neoplasm of endometrium: Secondary | ICD-10-CM | POA: Insufficient documentation

## 2023-06-05 DIAGNOSIS — K760 Fatty (change of) liver, not elsewhere classified: Secondary | ICD-10-CM | POA: Diagnosis not present

## 2023-06-05 MED ORDER — IOHEXOL 300 MG/ML  SOLN
100.0000 mL | Freq: Once | INTRAMUSCULAR | Status: AC | PRN
  Administered 2023-06-05: 100 mL via INTRAVENOUS

## 2023-06-06 ENCOUNTER — Encounter: Payer: Self-pay | Admitting: Occupational Therapy

## 2023-06-11 ENCOUNTER — Encounter: Payer: Self-pay | Admitting: Occupational Therapy

## 2023-06-12 ENCOUNTER — Inpatient Hospital Stay (HOSPITAL_BASED_OUTPATIENT_CLINIC_OR_DEPARTMENT_OTHER): Payer: PPO | Admitting: Obstetrics and Gynecology

## 2023-06-12 DIAGNOSIS — Z08 Encounter for follow-up examination after completed treatment for malignant neoplasm: Secondary | ICD-10-CM | POA: Diagnosis not present

## 2023-06-12 DIAGNOSIS — Z8542 Personal history of malignant neoplasm of other parts of uterus: Secondary | ICD-10-CM

## 2023-06-12 DIAGNOSIS — C541 Malignant neoplasm of endometrium: Secondary | ICD-10-CM

## 2023-06-12 NOTE — Progress Notes (Addendum)
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 G41P1 female who is seen in consultation from Dr. Smith Robert for endometrial cancer s/p RA-TLH-BSO with SLND and perineal repair at [facility] on 03/31/22 who presents for surveillance.  Telephone visit to follow up on CT results.     06/05/2023 CT CHEST, ABDOMEN, AND PELVIS WITH CONTRAST   TECHNIQUE: Multidetector CT imaging of the chest, abdomen and pelvis was performed following the standard protocol during bolus administration of intravenous contrast.   RADIATION DOSE REDUCTION: This exam was performed according to the departmental dose-optimization program which includes automated exposure control, adjustment of the mA and/or kV according to patient size and/or use of iterative reconstruction technique.   CONTRAST:  OMNIPAQUE IOHEXOL 300 MG/ML  SOLN   COMPARISON:  Multiple priors including most recent CT January 04, 2023   FINDINGS: CT CHEST FINDINGS   Cardiovascular: Interval removal of the right chest Port-A-Cath. Aortic atherosclerosis. Normal caliber thoracic aorta. No central pulmonary embolus on this nondedicated study. Normal size heart. No significant pericardial effusion/thickening.   Mediastinum/Nodes: No pathologically enlarged mediastinal, hilar or axillary lymph nodes. Mild symmetric distal esophageal wall thickening   Lungs/Pleura: No suspicious pulmonary nodules or masses. No pleural effusion. No pneumothorax.   Musculoskeletal: No aggressive lytic or blastic lesion of bone. Stable small focal area of sclerosis in the T3 vertebral body.   CT ABDOMEN PELVIS FINDINGS   Hepatobiliary: Peripherally enhancing hypodense 3.1 cm lesion in the dome of the liver on image 41/2 previously characterized as a benign hepatic hemangioma on MRI August 08, 2016.  Diffuse hepatic steatosis no new suspicious hepatic lesion. Gallbladder is unremarkable. No biliary ductal dilation.   Pancreas: No pancreatic ductal dilation or evidence of acute inflammation.   Spleen: No splenomegaly.   Adrenals/Urinary Tract: Stable left adrenal nodule measuring 16 x 9 mm on image 56/2, not hypermetabolic on prior PET-CT and demonstrating loss of signal on out of phase imaging on MRI August 08, 2016 compatible with a benign adenoma which requires no independent imaging follow-up. Right adrenal gland appears normal.   No hydronephrosis. No suspicious renal mass. Urinary bladder is unremarkable for degree of distension.   Stomach/Bowel: Stomach is unremarkable for degree of distension. No pathologic dilation of small or large bowel. Questionable asymmetric wall thickening versus underdistention of the rectum on image 112/2.   Vascular/Lymphatic: Normal caliber abdominal aorta smooth IVC contours. The portal, splenic and superior mesenteric veins are patent. No pathologically enlarged abdominal or pelvic lymph nodes   Reproductive: Uterus is surgically absent. No new suspicious nodularity along the vaginal cuff. No abnormal adnexal mass.   Other: No significant abdominopelvic free fluid. No discrete peritoneal or omental nodularity.   Musculoskeletal: No aggressive lytic or blastic lesion of bone. Probable postradiation change in the sacrum.   IMPRESSION: 1. No evidence of metastatic disease in the chest, abdomen or pelvis. 2. Questionable asymmetric wall thickening versus underdistention of the rectum, consider correlation with direct inspection. 3. Mild symmetric distal esophageal wall thickening, correlate for esophagitis. 4. Diffuse hepatic steatosis. 5.  Aortic Atherosclerosis (ICD10-I70.0).     Electronically Signed   By: Maudry Mayhew M.D.   On: 06/09/2023 11:44     Gyn Oncology History She presented as 62 year old female with PMB to  gyn. Biopsy showed endometrial cancer and was referred to Dr.  Tresa Endo from [facility]. She underwent RA-TLH-BSo on 03/30/2022.   Final Pathologic Diagnosis  A. SENTINEL LYMPH NODE, RIGHT EXTERNAL ILIAC ARTERY, RESECTION: Two lymph nodes, negative for metastatic carcinoma (0/2).    B. SENTINEL LYMPH NODE, LEFT EXTERNAL ILIAC ARTERY, RESECTION: One lymph node, negative for metastatic carcinoma (0/1).    C. UTERUS, FALLOPIAN TUBE AND OVARIES, HYSTERECTOMY WITH BILATERAL SALPINGO-OOPHORECTOMY: Endometrioid adenocarcinoma, FIGO grade 2. Tumor measures 5 cm in greatest dimension.  Tumor invades 14 of 17 mm thick myometrium (greater than 50%).  Margins of resection are uninvolved by tumor.  Tumor involves lower uterine segment.  Cervix is uninvolved by tumor.  Benign right fallopian tube and ovary.  Left ovary with tumor located within lymphovascular spaces, no tissue invasion identified.  Left fallopian tube with tumor within lymphovascular spaces, no tissue invasion identified. Free floating tumor located inside left fallopian tube.  pT1b pN0   Surgery was complicated by vaginal laceration/episiotomy. During the repair, the tip of the needle broke off. Xray was performed and confirmed 'curvilinear suture needle projects over the left labial tissue/left perineum'. Needle was located using fluoroscopy and was removed in its entirety and incision was then repaired. Surgery was prolonged due to this with estimated time of ~ 7 hours.   Post op course was then complicated by fever requiring hospitalization for another week.   Dr. Tresa Endo saw patient, recommended adjuvant chemotherapy with Carbo Taxol for 3 cycles followed by vaginal brachytherapy.  Patient transferred care to [facility] and has been followed by Dr. Smith Robert with medical oncology. 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 She is not a diabetic. She has baseline rheumatoid arthritis   Pathology was also second for a second  opinion to [facility] as well.  Path report suggested that this was a T3a tumor FIGO stage IIIa disease. Left ovary and fallopian tube: Positive for carcinoma, lymphovascular involvement. Microcystic elongated fragmented (MELF) pattern of invasion and associated lymphovascular invasion.    1. Right external iliac artery sentinel lymph node, lymphadenectomy:   Two lymph nodes, negative for malignancy (0/2).   2.  Left external iliac artery, sentinel lymph node, lymphadenectomy:   One lymph node, suspicious for isolated tumor cells, see comment.   Comment: The paraffin block was requested for keratin immunohistochemistry, but the referring institution declined the request.  Since we were unable to obtain material for immunohistochemistry, this node will be classified as negative for synoptic reporting purposes and staging.     MLH1: Retained expression (wild-type). PMS2: Retained expression (wild-type). MSH2: Retained expression (wild-type). MSH6: Retained expression (wild-type).  P53 immunohistochemistry:   Wild type pattern (stain performed at [facility] on outside block C5).   PET 06/05/22 IMPRESSION: 1. Status post hysterectomy without suspicious hypermetabolic soft tissue nodularity along the vaginal cuff to suggest residual disease. 2. No convincing evidence of hypermetabolic metastatic disease. 3. Mild mesenteric and omental stranding without abnormal FDG avid nodularity possibly reflecting postsurgical change, suggest attention on follow-up imaging. 4. Incidental 1.4 cm right thyroid nodule with slightly increased uptake. Recommend nonemergent thyroid US and biopsy of nodule if detected. Reference: J Am Coll Radiol. 2015 Feb;12(2): 143-50 5. Slightly increased background thyroid activity is nonspecific but may reflect thyroiditis recommend correlation with laboratory values. 6.  Aortic Atherosclerosis (ICD10-I70.0).  Received 3 cycles of CarboTaxol chemotherapy by Dr. Smith Robert. She requires  G-CSF for leukopenia.  10/23 CT scan  shows no evidence of disease.  [facility] Tumor Board recommended completion of 6 cycles of carboplatin/paclitaxel followed by repeat PET versus  CT scan to assess status of vaginal nodularity. If nodularity present and concerning for persistent disease (FDG avid or biopsy-proven) could either treat with EBRT + VBT or surgical resection followed by VBT. Imaging findings could be postoperative in nature given proximity to surgery.   10/08/22 6 cycles of carbo-taxol completed  10/29/22  vaginal brachytherapy completed   01/07/23 CT C/A/P IMPRESSION: No developing new mass lesion, fluid collection or lymph node enlargement. The areas of stranding in the mesentery are less visible today. No new areas of nodularity or ascites.   Fatty liver infiltration.  Gallstones.  Hepatic hemangioma.   Stable left adrenal nodule when adjusted for variances in technique.     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 MMR intact.   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   Hyperlipidemia LDL goal <100 02/16/2020   Cervical radicular pain (RIGHT) 09/29/2019   Bilateral carpal tunnel syndrome 09/29/2019   Essential hypertension 12/19/2016   Allergic rhinitis due to  pollen     Past Medical History: Past Medical History:  Diagnosis Date   Allergic rhinitis due to pollen    Anemia    COVID-19 2022   Endometrial cancer (HCC)    Hyperlipidemia LDL goal <100 02/16/2020   Hypertension 12/19/2016    Past Surgical History: Past Surgical History:  Procedure Laterality Date   ABDOMINAL HYSTERECTOMY     CESAREAN SECTION     COLONOSCOPY WITH PROPOFOL N/A 11/12/2016   Procedure: COLONOSCOPY WITH PROPOFOL;  Surgeon: Midge Minium, MD;  Location: [facility];  Service: Endoscopy;  Laterality: N/A;   COLONOSCOPY WITH PROPOFOL N/A 03/29/2017   Procedure: COLONOSCOPY WITH PROPOFOL;  Surgeon: Midge Minium, MD;  Location: [facility];  Service: Endoscopy;  Laterality: N/A;   COLONOSCOPY WITH PROPOFOL N/A 03/14/2020   Procedure: COLONOSCOPY WITH PROPOFOL;  Surgeon: Midge Minium, MD;  Location: [facility];  Service: Endoscopy;  Laterality: N/A;  priority 4   CONTINUOUS NERVE MONITORING N/A 04/30/2023   Procedure: CONTINUOUS NERVE MONITORING;  Surgeon: Linus Salmons, MD;  Location: [facility];  Service: ENT;  Laterality: N/A;   IR IMAGING GUIDED PORT INSERTION  06/01/2022   IR REMOVAL TUN ACCESS W/ PORT W/O FL MOD SED  01/17/2023   POLYPECTOMY  11/12/2016   Procedure: POLYPECTOMY;  Surgeon: Midge Minium, MD;  Location: [facility];  Service: Endoscopy;;   POLYPECTOMY  03/29/2017   Procedure: POLYPECTOMY;  Surgeon: Midge Minium, MD;  Location: [facility];  Service: Endoscopy;;   THYROIDECTOMY N/A 04/30/2023   Procedure: THYROIDECTOMY;  Surgeon: Linus Salmons, MD;  Location: [facility];  Service: ENT;  Laterality: N/A;   Past Gynecologic History:  Menarche: age 66 Last pap: per hpi Post menopausal Sexually active.  Pap- 06/30/2009- NILM. HPV not performed 02/25/2007- ASCUS  OB History:  OB History  Gravida Para Term Preterm AB Living  1 1 1  1  SAB IAB Ectopic Multiple Live Births               # Outcome Date GA Lbr  Len/2nd Weight Sex Type Anes PTL Lv  1 Term             Family History: Family History  Problem Relation Age of Onset   Hepatitis C Mother 31       d/c at 54   Hypertension Father    Hepatitis C Brother    Breast cancer Neg Hx     Social History: Social History   Socioeconomic History   Marital status: Married    Spouse name: Not on file   Number of children: Not on file   Years of education: Not on file   Highest education level: Not on file  Occupational History   Not on file  Tobacco Use   Smoking status: Former    Current packs/day: 0.00    Types: Cigarettes    Quit date: 10/22/1978    Years since quitting: 44.6   Smokeless tobacco: Never  Vaping Use   Vaping status: Never Used  Substance and Sexual Activity   Alcohol use: No   Drug use: No   Sexual activity: Yes    Partners: Male  Other Topics Concern   Not on file  Social History Narrative   Not on file   Social Determinants of Health   Financial Resource Strain: Patient Declined (04/16/2023)   Received from [facility]   Overall Financial Resource Strain (CARDIA)    Difficulty of Paying Living Expenses: Patient declined  Food Insecurity: No Food Insecurity (04/30/2023)   Hunger Vital Sign    Worried About Running Out of Food in the Last Year: Never true    Ran Out of Food in the Last Year: Never true  Transportation Needs: No Transportation Needs (04/30/2023)   PRAPARE - Administrator, Civil Service (Medical): No    Lack of Transportation (Non-Medical): No  Physical Activity: Inactive (06/22/2022)   Exercise Vital Sign    Days of Exercise per Week: 0 days    Minutes of Exercise per Session: 0 min  Stress: Stress Concern Present (06/22/2022)   [employer] of Occupational Health - Occupational Stress Questionnaire    Feeling of Stress : To some extent  Social Connections: Socially Integrated (06/22/2022)   Social Connection and Isolation Panel  [NHANES]    Frequency of Communication with Friends and Family: Three times a week    Frequency of Social Gatherings with Friends and Family: Three times a week    Attends Religious Services: 1 to 4 times per year    Active Member of Clubs or Organizations: No    Attends Banker Meetings: 1 to 4 times per year    Marital Status: Married  Catering manager Violence: Not At Risk (04/30/2023)   Humiliation, Afraid, Rape, and Kick questionnaire    Fear of Current or Ex-Partner: No    Emotionally Abused: No    Physically Abused: No    Sexually Abused: No    Allergies: Allergies  Allergen Reactions   Azithromycin Other (See Comments)    Gets a bad yeast infection from this.   Wound Dressing Adhesive Rash    bandaid   Lab . Review N/A  Radiologic Imaging: As per HPI/interval history     Assessment:  Jaime Johnson is a 62 y.o. female diagnosed with Stage IIIA (  pMMR; p53wt), grade 2 endometrioid endometrial cancer.  Underwent robotic TLH/BSO SLN mapping and biopsies 03/30/22 at [facility].  Tumor measures 5 cm in greatest dimension and invades 14 of 17 mm thick myometrium (greater than 50%), cervix is uninvolved by tumor, Microcystic elongated fragmented (MELF) pattern of invasion and associated lymphovascular invasion.  Left ovary with tumor located within lymphovascular spaces, no tissue invasion identified. Left fallopian tube with tumor within lymphovascular spaces, no tissue invasion identified. Free floating tumor located inside left fallopian tube. Left external iliac artery SLN suspicious for isolated tumor cells.    PET scan 8/23 with some questionable vaginal nodularity vs post op changes.  Completed 6 cycles of CarboTaxol chemotherapy and vaginal brachytherapy 1/24.  CT scan negative 10/23.  Vaginal exam normal on exam today. NED  Abdominal discomfort and bloating concerning for recurrence - imaging reassuring with CT 06/05/2023 with not evidence of metastatic disease.   Thickening of esophagus and rectum nonspecific. Rectal findings possible secondary to prior brachytherapy radiation. Pulmonary findings normal and no radiology etiology regarding cough.   Stable adrenal findings 06/05/2023 c/w benign adrenal lesion no follow up indicated  Desired sexual function.  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 There is no height or weight on file to calculate BMI.   Plan:   Problem List Items Addressed This Visit       Genitourinary   Endometrial carcinoma (HCC) - Primary (Chronic)     If CT scan negative she will continue surveillance with Dr. Smith Robert with alternating follow up every three months, and Gyn Onc in 6 months.  She is scheduled to have a colonoscopy and I defer to her PCP and GI regarding this test.   Sexual function counseling was provided by Consuello Masse.  She previously discussed the use of vaginal lubricants and moisturizers as well as dilator therapy.   She will follow-up with her other care providers regarding her other medical issues.   I connected with  Kris Mouton on 06/12/23 by telephone application and verified that I am speaking with the correct person using two identifiers. A total of 15 minutes were spent with the patient; 50% was spent in education, counseling and coordination of care.    We reviewed her CT scans. I discussed my limitations of reviewing colonoscopy questions evaluation and recommended she follow up with her PCP/GI regarding management regarding this issue. The patient expressed understanding and agreed to proceed.     Anamae Rochelle Leta Jungling, MD

## 2023-06-13 ENCOUNTER — Encounter: Payer: Self-pay | Admitting: Occupational Therapy

## 2023-06-13 ENCOUNTER — Other Ambulatory Visit: Payer: Self-pay

## 2023-06-18 ENCOUNTER — Encounter: Payer: Self-pay | Admitting: Occupational Therapy

## 2023-06-19 ENCOUNTER — Telehealth: Payer: PPO

## 2023-06-20 ENCOUNTER — Encounter: Payer: Self-pay | Admitting: Occupational Therapy

## 2023-06-26 ENCOUNTER — Ambulatory Visit: Payer: PPO | Admitting: Family Medicine

## 2023-07-06 DIAGNOSIS — C73 Malignant neoplasm of thyroid gland: Secondary | ICD-10-CM | POA: Insufficient documentation

## 2023-07-06 NOTE — Progress Notes (Unsigned)
Jaime Johnson T. Jaime Reznik, MD, CAQ Sports Medicine [facility] [address] [place], [address]  Phone: [phone]  FAX: [phone]  Jaime Johnson - 62 y.o. female  MRN [id number]  Date of Birth: 1960/12/11  Date: 07/08/2023  PCP: Hannah Beat, MD  Referral: Hannah Beat, MD  No chief complaint on file.  Subjective:   Jaime Johnson is a 62 y.o. very pleasant female patient with There is no height or weight on file to calculate BMI. who presents with the following:  The patient presents for follow-up after thyroidectomy.  On chart review, the patient has had papillary thyroid carcinoma. She also has endometrial carcinoma.    Review of Systems is noted in the HPI, as appropriate  Objective:   LMP 02/29/2020 (Approximate) Comment: Has had random spotting; hadn't had a period 1.5 yrs prior  GEN: No acute distress; alert,appropriate. PULM: Breathing comfortably in no respiratory distress PSYCH: Normally interactive.   Laboratory and Imaging Data:  Assessment and Plan:   ***

## 2023-07-08 ENCOUNTER — Ambulatory Visit (INDEPENDENT_AMBULATORY_CARE_PROVIDER_SITE_OTHER): Payer: PPO | Admitting: Family Medicine

## 2023-07-08 ENCOUNTER — Encounter: Payer: Self-pay | Admitting: Family Medicine

## 2023-07-08 ENCOUNTER — Encounter: Payer: Self-pay | Admitting: *Deleted

## 2023-07-08 VITALS — BP 162/88 | HR 74 | Temp 97.7°F | Ht 62.0 in | Wt 150.1 lb

## 2023-07-08 DIAGNOSIS — C73 Malignant neoplasm of thyroid gland: Secondary | ICD-10-CM

## 2023-07-08 DIAGNOSIS — K2289 Other specified disease of esophagus: Secondary | ICD-10-CM | POA: Diagnosis not present

## 2023-07-08 DIAGNOSIS — Z23 Encounter for immunization: Secondary | ICD-10-CM | POA: Diagnosis not present

## 2023-07-08 DIAGNOSIS — K639 Disease of intestine, unspecified: Secondary | ICD-10-CM | POA: Diagnosis not present

## 2023-07-08 DIAGNOSIS — M19042 Primary osteoarthritis, left hand: Secondary | ICD-10-CM

## 2023-07-08 DIAGNOSIS — M19041 Primary osteoarthritis, right hand: Secondary | ICD-10-CM

## 2023-07-09 ENCOUNTER — Encounter: Payer: Self-pay | Admitting: Nurse Practitioner

## 2023-07-09 ENCOUNTER — Inpatient Hospital Stay: Payer: PPO | Attending: Oncology | Admitting: Nurse Practitioner

## 2023-07-09 VITALS — BP 142/88 | HR 74 | Temp 96.5°F | Wt 152.0 lb

## 2023-07-09 DIAGNOSIS — Z87891 Personal history of nicotine dependence: Secondary | ICD-10-CM | POA: Diagnosis not present

## 2023-07-09 DIAGNOSIS — R6882 Decreased libido: Secondary | ICD-10-CM | POA: Diagnosis not present

## 2023-07-09 DIAGNOSIS — N941 Unspecified dyspareunia: Secondary | ICD-10-CM | POA: Insufficient documentation

## 2023-07-09 DIAGNOSIS — C541 Malignant neoplasm of endometrium: Secondary | ICD-10-CM | POA: Insufficient documentation

## 2023-07-09 DIAGNOSIS — Z79899 Other long term (current) drug therapy: Secondary | ICD-10-CM | POA: Diagnosis not present

## 2023-07-09 DIAGNOSIS — N952 Postmenopausal atrophic vaginitis: Secondary | ICD-10-CM | POA: Diagnosis not present

## 2023-07-09 DIAGNOSIS — N905 Atrophy of vulva: Secondary | ICD-10-CM | POA: Insufficient documentation

## 2023-07-09 MED ORDER — DIAZEPAM 5 MG PO TABS: 5.0000 mg | ORAL_TABLET | Freq: Every day | ORAL | 0 refills | Status: DC | PRN

## 2023-07-09 MED ORDER — BUPROPION HCL ER (XL) 150 MG PO TB24: 300.0000 mg | ORAL_TABLET | Freq: Every day | ORAL | 1 refills | Status: DC

## 2023-07-09 MED ORDER — LIDOCAINE 5 % EX OINT: 1.0000 | TOPICAL_OINTMENT | CUTANEOUS | 0 refills | Status: AC | PRN

## 2023-07-09 NOTE — Progress Notes (Unsigned)
Symptom Management Clinic  [facility] at [facility] A Department of the [place]. [facility] [address], [address] [place], [place] [address] [phone] (phone) [phone] (fax)  Patient Care Team: Hannah Beat, MD as PCP - General (Family Medicine) Jim Like, RN as Registered Nurse Scarlett Presto, RN (Inactive) as Registered Nurse Benita Gutter, RN as Oncology Nurse Navigator Carmina Miller, MD as Consulting Physician (Radiation Oncology) Artelia Laroche, MD as Referring Physician (Obstetrics) Creig Hines, MD as Consulting Physician (Oncology)   Name of the patient: Jaime Johnson  [id number]  [birth year]   Date of visit: 07/09/23  Diagnosis- Endometrial Cancer  Chief complaint/ Reason for visit- Dyspareunia  Heme/Onc history:  Oncology History  Endometrial carcinoma (HCC)  03/09/2022 Initial Diagnosis   Endometrial carcinoma (HCC)   05/22/2022 Cancer Staging   Staging form: Corpus Uteri - Carcinoma and Carcinosarcoma, AJCC 8th Edition - Pathologic stage from 05/22/2022: FIGO Stage IB (pT1b, pN0(i+), cM0) - Signed by Jeralyn Ruths, MD on 06/07/2022 Histologic grade (G): G2 Histologic grading system: 3 grade system Residual tumor (R): R0 - None   06/14/2022 -  Chemotherapy   Patient is on Treatment Plan : UTERINE Carboplatin AUC 6 + Paclitaxel q21d       Interval history- Patient is 62 year old female with above history of endometrial cancer, currently on surveillance, who presents to clinic for concerns of dyspareunia. This is a new problem since undergoing treatments for her malignancy. She has reduced libido as well as decreased sensation. She has noticed increased vaginal dryness but denies day to day symptoms. Expresses pain with intercourse as well as cramping like pain after using her dilator. Pain lasts for few days after exercises. She feels that insertion is uncomfortable. Her husband is  supportive of post cancer changes but remains interested in intercourse and patient would like to try to fix the fixable to help support her husband. Denies bleeding. She has ongoing mood changes and fatigue since completing treatment.   Review of systems- Review of Systems  Constitutional:  Negative for chills, fever, malaise/fatigue and weight loss.  HENT:  Negative for hearing loss, nosebleeds, sore throat and tinnitus.   Eyes:  Negative for blurred vision and double vision.  Respiratory:  Negative for cough, hemoptysis, shortness of breath and wheezing.   Cardiovascular:  Negative for chest pain, palpitations and leg swelling.  Gastrointestinal:  Negative for abdominal pain, blood in stool, constipation, diarrhea, melena, nausea and vomiting.  Genitourinary:  Negative for dysuria and urgency.  Musculoskeletal:  Negative for back pain, falls, joint pain and myalgias.  Skin:  Negative for itching and rash.  Neurological:  Negative for dizziness, tingling, sensory change, loss of consciousness, weakness and headaches.  Endo/Heme/Allergies:  Negative for environmental allergies. Does not bruise/bleed easily.  Psychiatric/Behavioral:  Positive for depression. The patient is not nervous/anxious and does not have insomnia.      Allergies  Allergen Reactions   Azithromycin Other (See Comments)    Gets a bad yeast infection from this.   Wound Dressing Adhesive Rash    bandaid    Past Medical History:  Diagnosis Date   Allergic rhinitis due to pollen    Anemia    COVID-19 2022   Endometrial cancer (HCC)    Hyperlipidemia LDL goal <100 02/16/2020   Hypertension 12/19/2016    Past Surgical History:  Procedure Laterality Date   ABDOMINAL HYSTERECTOMY     CESAREAN SECTION  COLONOSCOPY WITH PROPOFOL N/A 11/12/2016   Procedure: COLONOSCOPY WITH PROPOFOL;  Surgeon: Midge Minium, MD;  Location: [facility];  Service: Endoscopy;  Laterality: N/A;   COLONOSCOPY WITH PROPOFOL N/A  03/29/2017   Procedure: COLONOSCOPY WITH PROPOFOL;  Surgeon: Midge Minium, MD;  Location: [facility];  Service: Endoscopy;  Laterality: N/A;   COLONOSCOPY WITH PROPOFOL N/A 03/14/2020   Procedure: COLONOSCOPY WITH PROPOFOL;  Surgeon: Midge Minium, MD;  Location: [facility];  Service: Endoscopy;  Laterality: N/A;  priority 4   CONTINUOUS NERVE MONITORING N/A 04/30/2023   Procedure: CONTINUOUS NERVE MONITORING;  Surgeon: Linus Salmons, MD;  Location: [facility] OR[facility];  Service: ENT;  Laterality: N/A;   IR IMAGING GUIDED PORT INSERTION  06/01/2022   IR REMOVAL TUN ACCESS W/ PORT W/O FL MOD SED  01/17/2023   POLYPECTOMY  11/12/2016   Procedure: POLYPECTOMY;  Surgeon: Midge Minium, MD;  Location: [facility];  Service: Endoscopy;;   POLYPECTOMY  03/29/2017   Procedure: POLYPECTOMY;  Surgeon: Midge Minium, MD;  Location: [facility];  Service: Endoscopy;;   THYROIDECTOMY N/A 04/30/2023   Procedure: THYROIDECTOMY;  Surgeon: Linus Salmons, MD;  Location: [facility] ORS;  Service: ENT;  Laterality: N/A;    Social History   Socioeconomic History   Marital status: Married    Spouse name: Not on file   Number of children: Not on file   Years of education: Not on file   Highest education level: Associate degree: occupational, Scientist, product/process development, or vocational program  Occupational History   Not on file  Tobacco Use   Smoking status: Former    Current packs/day: 0.00    Types: Cigarettes    Quit date: 10/22/1978    Years since quitting: 44.7   Smokeless tobacco: Never  Vaping Use   Vaping status: Never Used  Substance and Sexual Activity   Alcohol use: No   Drug use: No   Sexual activity: Yes    Partners: Male  Other Topics Concern   Not on file  Social History Narrative   Not on file   Social Determinants of Health   Financial Resource Strain: Low Risk  (07/04/2023)   Overall Financial Resource Strain (CARDIA)    Difficulty of Paying Living Expenses: Not very hard  Food  Insecurity: No Food Insecurity (07/04/2023)   Hunger Vital Sign    Worried About Running Out of Food in the Last Year: Never true    Ran Out of Food in the Last Year: Never true  Transportation Needs: No Transportation Needs (07/04/2023)   PRAPARE - Administrator, Civil Service (Medical): No    Lack of Transportation (Non-Medical): No  Physical Activity: Insufficiently Active (07/04/2023)   Exercise Vital Sign    Days of Exercise per Week: 2 days    Minutes of Exercise per Session: 20 min  Stress: No Stress Concern Present (07/04/2023)   [employer] of Occupational Health - Occupational Stress Questionnaire    Feeling of Stress : Only a little  Social Connections: Socially Integrated (07/04/2023)   Social Connection and Isolation Panel [NHANES]    Frequency of Communication with Friends and Family: More than three times a week    Frequency of Social Gatherings with Friends and Family: More than three times a week    Attends Religious Services: More than 4 times per year    Active Member of [employer] or Organizations: Yes    Attends Banker Meetings: 1 to 4 times per year  Marital Status: Married  Catering manager Violence: Not At Risk (04/30/2023)   Humiliation, Afraid, Rape, and Kick questionnaire    Fear of Current or Ex-Partner: No    Emotionally Abused: No    Physically Abused: No    Sexually Abused: No    Family History  Problem Relation Age of Onset   Hepatitis C Mother 60       d/c at 64   Hypertension Father    Hepatitis C Brother    Breast cancer Neg Hx      Current Outpatient Medications:    buPROPion (WELLBUTRIN XL) 150 MG 24 hr tablet, Take 1 tablet (150 mg total) by mouth daily., Disp: 90 tablet, Rfl: 1   calcium carbonate (OS-CAL - DOSED IN MG OF ELEMENTAL CALCIUM) 1250 (500 Ca) MG tablet, Take 2 tablets (2,500 mg total) by mouth 3 (three) times daily with meals., Disp: 90 tablet, Rfl: 4   levothyroxine (SYNTHROID) 100 MCG tablet, Take  100 mcg by mouth daily., Disp: , Rfl:    losartan-hydrochlorothiazide (HYZAAR) 100-12.5 MG tablet, Take 1 tablet by mouth daily., Disp: , Rfl:    Multiple Vitamins-Minerals (MULTIVITAMIN GUMMIES ADULTS PO), Take by mouth daily., Disp: , Rfl:    traMADol (ULTRAM) 50 MG tablet, TAKE 1 TABLET(50 MG) BY MOUTH DAILY, Disp: 30 tablet, Rfl: 3  Physical exam:  Vitals:   07/09/23 0936  BP: (!) 142/88  Pulse: 74  Temp: (!) 96.5 F (35.8 C)  TempSrc: Tympanic  Weight: 152 lb (68.9 kg)   Physical Exam Constitutional:      Appearance: She is not ill-appearing.  Skin:    Coloration: Skin is not pale.  Neurological:     Mental Status: She is alert and oriented to person, place, and time.  Psychiatric:        Mood and Affect: Affect is tearful.        Speech: Speech normal.        Behavior: Behavior normal.     Assessment and plan- Patient is a 62 y.o. female diagnosed with endometrial cancer who underwent surgery, chemotherapy, and radiation as part of her treatment for malignancy, now on surveillance who presents to clinic for management of post treatment effects.   Vulvar & Vaginal dryness & atrophy- due to hormonal changes as well as radiation and surgery. Reviewed that this can manifest as burning, pulling, itching, pain. Hormonal based treatments are contraindicated. She can use topical moisturizers such as Replens or those with hyaluronic acid, Hyalogyn, to help improve tissue health. For sexual activity or dilator exercises I recommend vaginal lubricants. Vaginal moisturizers absorb into the skin. Lubricants do not and are slippery.  Dyspareunia- Reviewed post treatment anatomic changes including scarring, shortening, stenosis and how this can impact sexual health. Reviewed importance of dilator exercises to keep vagina open for surveillance of her malignancy. I suspect that her pain with insertional events is related to these anatomic changes. Recommend trial of lidocaine gel 5% 30 minutes  prior to intercourse, and dilator exercises. I also recommend use of intravaginal diazepam 5 mg. Apply a couple of drops of water onto the tablet before inserting high into the vagina 30 minutes prior to intercourse or dilator exercises. This will help relax the pelvic floor which may be contributing to her pain and post activity cramping.  Decreased libido- she hasn't had a good response with compounded Scream cream/sildenafil. I suspect adjustment disorder is contributing. Recommend increasing wellbutrin xl to 300 mg daily. May also help fatigue. Can continue  to trial topical sildenafil. Also reviewed that exercise works best so try to incorporate that. Also recommended evaluation with therapist--> AASECT certified.   Disposition:  Rtc in 8 weeks for follow up- la   Visit Diagnosis 1. Dyspareunia in female   2. Vaginal atrophy     Patient expressed understanding and was in agreement with this plan. She also understands that She can call clinic at any time with any questions, concerns, or complaints.   Thank you for allowing me to participate in the care of this very pleasant patient.   Consuello Masse, DNP, AGNP-C, AOCNP Cancer Center at [facility] [phone]

## 2023-07-09 NOTE — Patient Instructions (Signed)
Sex therapist (AASECT certified --> [email or web address])  I've sent prescriptions for lidocaine gel 5%- insert 30 minutes prior to intercourse or dilator therapy. I've also sent prescription for vaginal valium. Place a couple of drops of water on the tablet before inserting high into the vaginal 30 minutes prior to intercourse or dilator therapy.   Increase buproprion to 300 mg daily.

## 2023-07-20 ENCOUNTER — Encounter: Payer: Self-pay | Admitting: Oncology

## 2023-08-06 DIAGNOSIS — M19049 Primary osteoarthritis, unspecified hand: Secondary | ICD-10-CM | POA: Insufficient documentation

## 2023-08-06 DIAGNOSIS — M19041 Primary osteoarthritis, right hand: Secondary | ICD-10-CM | POA: Insufficient documentation

## 2023-08-06 DIAGNOSIS — M19042 Primary osteoarthritis, left hand: Secondary | ICD-10-CM | POA: Diagnosis not present

## 2023-08-06 DIAGNOSIS — M180 Bilateral primary osteoarthritis of first carpometacarpal joints: Secondary | ICD-10-CM | POA: Diagnosis not present

## 2023-08-12 ENCOUNTER — Encounter: Payer: Self-pay | Admitting: Gastroenterology

## 2023-08-12 ENCOUNTER — Ambulatory Visit: Payer: PPO | Admitting: Gastroenterology

## 2023-08-12 VITALS — BP 185/84 | HR 80 | Temp 98.4°F | Ht 62.0 in | Wt 151.0 lb

## 2023-08-12 DIAGNOSIS — Z860100 Personal history of colon polyps, unspecified: Secondary | ICD-10-CM | POA: Diagnosis not present

## 2023-08-12 DIAGNOSIS — R933 Abnormal findings on diagnostic imaging of other parts of digestive tract: Secondary | ICD-10-CM | POA: Diagnosis not present

## 2023-08-12 MED ORDER — NA SULFATE-K SULFATE-MG SULF 17.5-3.13-1.6 GM/177ML PO SOLN: 1.0000 | Freq: Once | ORAL | 0 refills | Status: AC

## 2023-08-12 NOTE — Addendum Note (Signed)
Addended by: Roena Malady on: 08/12/2023 02:08 PM   Modules accepted: Orders

## 2023-08-12 NOTE — Progress Notes (Signed)
Gastroenterology Consultation  Referring Provider:     Hannah Beat, MD Primary Care Physician:  Hannah Beat, MD Primary Gastroenterologist:  Dr. Servando Snare     Reason for Consultation:     Abnormal CT scan of the chest        HPI:   Jaime Johnson is a 62 y.o. y/o female referred for consultation & management of abnormal CT scan of the chest by Dr. Hannah Beat, MD. This patient comes in with a history of endometrial cancer and recently had a CT scan of the chest.  The CT scan showed:  IMPRESSION: 1. No evidence of metastatic disease in the chest, abdomen or pelvis. 2. Questionable asymmetric wall thickening versus underdistention of the rectum, consider correlation with direct inspection. 3. Mild symmetric distal esophageal wall thickening, correlate for esophagitis. 4. Diffuse hepatic steatosis. 5.  Aortic Atherosclerosis  Because of the esophageal wall and rectum thickening the patient was recommended to come see me today.  She was found to have thyroid cancer that was treated. No reports of dysphagia or GI problems at this time.  The patient thinks that her rectal changes are due to the radiation she had.  She has no dysphagia black stools or bloody stools.  Past Medical History:  Diagnosis Date   Allergic rhinitis due to pollen    Anemia    COVID-19 2022   Endometrial cancer (HCC)    Hyperlipidemia LDL goal <100 02/16/2020   Hypertension 12/19/2016    Past Surgical History:  Procedure Laterality Date   ABDOMINAL HYSTERECTOMY     CESAREAN SECTION     COLONOSCOPY WITH PROPOFOL N/A 11/12/2016   Procedure: COLONOSCOPY WITH PROPOFOL;  Surgeon: Midge Minium, MD;  Location: [facility];  Service: Endoscopy;  Laterality: N/A;   COLONOSCOPY WITH PROPOFOL N/A 03/29/2017   Procedure: COLONOSCOPY WITH PROPOFOL;  Surgeon: Midge Minium, MD;  Location: [facility];  Service: Endoscopy;  Laterality: N/A;   COLONOSCOPY WITH PROPOFOL N/A 03/14/2020    Procedure: COLONOSCOPY WITH PROPOFOL;  Surgeon: Midge Minium, MD;  Location: [facility];  Service: Endoscopy;  Laterality: N/A;  priority 4   CONTINUOUS NERVE MONITORING N/A 04/30/2023   Procedure: CONTINUOUS NERVE MONITORING;  Surgeon: Linus Salmons, MD;  Location: [facility] ORS;  Service: ENT;  Laterality: N/A;   IR IMAGING GUIDED PORT INSERTION  06/01/2022   IR REMOVAL TUN ACCESS W/ PORT W/O FL MOD SED  01/17/2023   POLYPECTOMY  11/12/2016   Procedure: POLYPECTOMY;  Surgeon: Midge Minium, MD;  Location: [facility];  Service: Endoscopy;;   POLYPECTOMY  03/29/2017   Procedure: POLYPECTOMY;  Surgeon: Midge Minium, MD;  Location: [facility];  Service: Endoscopy;;   THYROIDECTOMY N/A 04/30/2023   Procedure: THYROIDECTOMY;  Surgeon: Linus Salmons, MD;  Location: [facility] OR[facility];  Service: ENT;  Laterality: N/A;    Prior to Admission medications   Medication Sig Start Date End Date Taking? Authorizing Provider  buPROPion (WELLBUTRIN XL) 150 MG 24 hr tablet Take 2 tablets (300 mg total) by mouth daily. 07/09/23   Alinda Dooms, NP  calcium carbonate (OS-CAL - DOSED IN MG OF ELEMENTAL CALCIUM) 1250 (500 Ca) MG tablet Take 2 tablets (2,500 mg total) by mouth 3 (three) times daily with meals. 05/02/23   Linus Salmons, MD  diazepam (VALIUM) 5 MG tablet Take 1 tablet (5 mg total) by mouth daily as needed for anxiety (for dilator therapy). 07/09/23   Alinda Dooms, NP  levothyroxine (SYNTHROID) 100 MCG tablet  Take 100 mcg by mouth daily.    [provider]  lidocaine (XYLOCAINE) 5 % ointment Apply 1 Application topically as needed (for dilator therapy). 07/09/23   Alinda Dooms, NP  losartan-hydrochlorothiazide (HYZAAR) 100-12.5 MG tablet Take 1 tablet by mouth daily.    [provider]  Multiple Vitamins-Minerals (MULTIVITAMIN GUMMIES ADULTS PO) Take by mouth daily.    [provider]  traMADol (ULTRAM) 50 MG tablet TAKE 1 TABLET(50 MG) BY MOUTH DAILY  04/09/23   Copland, Karleen Hampshire, MD    Family History  Problem Relation Age of Onset   Hepatitis C Mother 15       d/c at 22   Hypertension Father    Hepatitis C Brother    Breast cancer Neg Hx      Social History   Tobacco Use   Smoking status: Former    Current packs/day: 0.00    Types: Cigarettes    Quit date: 10/22/1978    Years since quitting: 44.8   Smokeless tobacco: Never  Vaping Use   Vaping status: Never Used  Substance Use Topics   Alcohol use: No   Drug use: No    Allergies as of 08/12/2023 - Review Complete 07/09/2023  Allergen Reaction Noted   Azithromycin Other (See Comments) 04/13/2019   Wound dressing adhesive Rash 04/23/2023    Review of Systems:    All systems reviewed and negative except where noted in HPI.   Physical Exam:  LMP 02/29/2020 (Approximate) Comment: Has had random spotting; hadn't had a period 1.5 yrs prior Patient's last menstrual period was 02/29/2020 (approximate). General:   Alert,  Well-developed, well-nourished, pleasant and cooperative in NAD Head:  Normocephalic and atraumatic. Eyes:  Sclera clear, no icterus.   Conjunctiva pink. Ears:  Normal auditory acuity. Neck:  Supple; no masses or thyromegaly. Lungs:  Respirations even and unlabored.  Clear throughout to auscultation.   No wheezes, crackles, or rhonchi. No acute distress. Heart:  Regular rate and rhythm; no murmurs, clicks, rubs, or gallops. Abdomen:  Normal bowel sounds.  No bruits.  Soft, non-tender and non-distended without masses, hepatosplenomegaly or hernias noted.  No guarding or rebound tenderness.  Negative Carnett sign.   Rectal:  Deferred.  Pulses:  Normal pulses noted. Extremities:  No clubbing or edema.  No cyanosis. Neurologic:  Alert and oriented x3;  grossly normal neurologically. Skin:  Intact without significant lesions or rashes.  No jaundice. Lymph Nodes:  No significant cervical adenopathy. Psych:  Alert and cooperative. Normal mood and  affect.  Imaging Studies: No results found.  Assessment and Plan:   Jaime Johnson is a 61 y.o. y/o female comes in today with a history of thyroid cancer, endometrial cancer with radiation and now found to have a thickened esophagus and rectum.  The patient has a history of colon polyps with her last colonoscopy in 2021.  The patient will be set up for colonoscopy and upper endoscopy to evaluate the abnormal CT scan findings.  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  The patient has been explained the plan and agrees with it.    Midge Minium, MD. Clementeen Graham    Note: This dictation was prepared with Dragon dictation along with smaller phrase technology. Any transcriptional errors that result from this process are unintentional.

## 2023-08-13 ENCOUNTER — Encounter: Payer: Self-pay | Admitting: Gastroenterology

## 2023-08-19 ENCOUNTER — Encounter: Payer: Self-pay | Admitting: Gastroenterology

## 2023-08-19 ENCOUNTER — Other Ambulatory Visit: Payer: Self-pay

## 2023-08-19 ENCOUNTER — Ambulatory Visit: Payer: PPO | Admitting: Anesthesiology

## 2023-08-19 ENCOUNTER — Encounter
Admission: RE | Disposition: A | Discharge status: Home / Self Care | Payer: Self-pay | Source: Home / Self Care | Attending: Gastroenterology

## 2023-08-19 ENCOUNTER — Ambulatory Visit
Admission: RE | Admit: 2023-08-19 | Discharge: 2023-08-19 | Disposition: A | Discharge status: Home / Self Care | Payer: PPO | Attending: Gastroenterology | Admitting: Gastroenterology

## 2023-08-19 DIAGNOSIS — I10 Essential (primary) hypertension: Secondary | ICD-10-CM | POA: Diagnosis not present

## 2023-08-19 DIAGNOSIS — Z87891 Personal history of nicotine dependence: Secondary | ICD-10-CM | POA: Insufficient documentation

## 2023-08-19 DIAGNOSIS — Z85850 Personal history of malignant neoplasm of thyroid: Secondary | ICD-10-CM | POA: Insufficient documentation

## 2023-08-19 DIAGNOSIS — Z8616 Personal history of COVID-19: Secondary | ICD-10-CM | POA: Diagnosis not present

## 2023-08-19 DIAGNOSIS — K3189 Other diseases of stomach and duodenum: Secondary | ICD-10-CM | POA: Insufficient documentation

## 2023-08-19 DIAGNOSIS — K640 First degree hemorrhoids: Secondary | ICD-10-CM | POA: Insufficient documentation

## 2023-08-19 DIAGNOSIS — R933 Abnormal findings on diagnostic imaging of other parts of digestive tract: Secondary | ICD-10-CM

## 2023-08-19 DIAGNOSIS — K317 Polyp of stomach and duodenum: Secondary | ICD-10-CM

## 2023-08-19 DIAGNOSIS — D132 Benign neoplasm of duodenum: Secondary | ICD-10-CM

## 2023-08-19 DIAGNOSIS — Z8542 Personal history of malignant neoplasm of other parts of uterus: Secondary | ICD-10-CM | POA: Insufficient documentation

## 2023-08-19 DIAGNOSIS — E039 Hypothyroidism, unspecified: Secondary | ICD-10-CM | POA: Diagnosis not present

## 2023-08-19 HISTORY — DX: Dizziness and giddiness: R42

## 2023-08-19 HISTORY — PX: COLONOSCOPY WITH PROPOFOL: SHX5780

## 2023-08-19 HISTORY — PX: ESOPHAGOGASTRODUODENOSCOPY: SHX5428

## 2023-08-19 HISTORY — PX: POLYPECTOMY: SHX5525

## 2023-08-19 SURGERY — COLONOSCOPY WITH PROPOFOL
Anesthesia: General

## 2023-08-19 MED ORDER — PROPOFOL 10 MG/ML IV BOLUS
INTRAVENOUS | Status: DC | PRN
  Administered 2023-08-19: 10 mg via INTRAVENOUS
  Administered 2023-08-19: 20 mg via INTRAVENOUS
  Administered 2023-08-19: 30 mg via INTRAVENOUS
  Administered 2023-08-19: 40 mg via INTRAVENOUS
  Administered 2023-08-19: 30 mg via INTRAVENOUS
  Administered 2023-08-19: 70 mg via INTRAVENOUS

## 2023-08-19 MED ORDER — STERILE WATER FOR IRRIGATION IR SOLN
Status: DC | PRN
  Administered 2023-08-19 (×2): 50 mL

## 2023-08-19 MED ORDER — SODIUM CHLORIDE 0.9 % IV SOLN: INTRAVENOUS | Status: DC

## 2023-08-19 MED ORDER — SODIUM CHLORIDE 0.9% FLUSH: 10.0000 mL | INTRAVENOUS | Status: DC | PRN

## 2023-08-19 MED ORDER — LIDOCAINE HCL (PF) 2 % IJ SOLN
INTRAMUSCULAR | Status: AC
  Filled 2023-08-19: qty 5

## 2023-08-19 MED ORDER — LIDOCAINE HCL (CARDIAC) PF 100 MG/5ML IV SOSY
PREFILLED_SYRINGE | INTRAVENOUS | Status: DC | PRN
  Administered 2023-08-19: 60 mg via INTRAVENOUS

## 2023-08-19 MED ORDER — PROPOFOL 10 MG/ML IV BOLUS
INTRAVENOUS | Status: AC
  Filled 2023-08-19: qty 40

## 2023-08-19 MED ORDER — STERILE WATER FOR IRRIGATION IR SOLN
Status: DC | PRN
  Administered 2023-08-19: 60 mL

## 2023-08-19 SURGICAL SUPPLY — 9 items
BLOCK BITE 60FR ADLT L/F GRN (MISCELLANEOUS) ×2 IMPLANT
GOWN CVR UNV OPN BCK APRN NK (MISCELLANEOUS) ×4 IMPLANT
GOWN ISOL THUMB LOOP REG UNIV (MISCELLANEOUS) ×4
KIT PRC NS LF DISP ENDO (KITS) ×2 IMPLANT
KIT PROCEDURE OLYMPUS (KITS) ×2
MANIFOLD NEPTUNE II (INSTRUMENTS) ×2 IMPLANT
SNARE COLD EXACTO (MISCELLANEOUS) IMPLANT
TRAP ETRAP POLY (MISCELLANEOUS) IMPLANT
WATER STERILE IRR 250ML POUR (IV SOLUTION) ×2 IMPLANT

## 2023-08-19 NOTE — Transfer of Care (Signed)
Immediate Anesthesia Transfer of Care Note  Patient: Jaime Johnson  Procedure(s) Performed: COLONOSCOPY WITH PROPOFOL ESOPHAGOGASTRODUODENOSCOPY (EGD) POLYPECTOMY  Patient Location: PACU  Anesthesia Type: General  Level of Consciousness: awake, alert  and patient cooperative  Airway and Oxygen Therapy: Patient Spontanous Breathing and Patient connected to supplemental oxygen  Post-op Assessment: Post-op Vital signs reviewed, Patient's Cardiovascular Status Stable, Respiratory Function Stable, Patent Airway and No signs of Nausea or vomiting  Post-op Vital Signs: Reviewed and stable  Complications: No notable events documented.

## 2023-08-19 NOTE — Op Note (Signed)
[facility] Gastroenterology Patient Name: Jaime Johnson Procedure Date: 08/19/2023 8:10 AM MRN: [id number] Account #: [id number] Date of Birth: 21-Sep-1961 Admit Type: Outpatient Age: 62 Room: [facility] OR ROOM 01 Gender: Female Note Status: Finalized Instrument Name: [id number] Procedure:             Colonoscopy Indications:           Abnormal PET scan of the GI tract Providers:             Midge Minium MD, MD Medicines:             Propofol per Anesthesia Complications:         No immediate complications. Procedure:             Pre-Anesthesia Assessment:                        - Prior to the procedure, a History and Physical was                         performed, and patient medications and allergies were                         reviewed. The patient's tolerance of previous                         anesthesia was also reviewed. The risks and benefits                         of the procedure and the sedation options and risks                         were discussed with the patient. All questions were                         answered, and informed consent was obtained. Prior                         Anticoagulants: The patient has taken no anticoagulant                         or antiplatelet agents. ASA Grade Assessment: II - A                         patient with mild systemic disease. After reviewing                         the risks and benefits, the patient was deemed in                         satisfactory condition to undergo the procedure.                        After obtaining informed consent, the colonoscope was                         passed under direct vision. Throughout the procedure,                         the patient's  blood pressure, pulse, and oxygen                         saturations were monitored continuously. The                         Colonoscope was introduced through the anus and                         advanced to the the cecum, identified by  appendiceal                         orifice and ileocecal valve. The colonoscopy was                         performed without difficulty. The patient tolerated                         the procedure well. The quality of the bowel                         preparation was excellent. Findings:      The perianal and digital rectal examinations were normal.      Non-bleeding internal hemorrhoids were found during retroflexion. The       hemorrhoids were Grade I (internal hemorrhoids that do not prolapse).      The exam was otherwise without abnormality. Impression:            - Non-bleeding internal hemorrhoids.                        - The examination was otherwise normal.                        - No specimens collected. Recommendation:        - Discharge patient to home.                        - Resume previous diet.                        - Continue present medications.                        - Repeat colonoscopy in 7 years for screening purposes. Procedure Code(s):     --- Professional ---                        [phone], Colonoscopy, flexible; diagnostic, including                         collection of specimen(s) by brushing or washing, when                         performed (separate procedure) Diagnosis Code(s):     --- Professional ---                        R93.3, Abnormal findings on diagnostic imaging of  other parts of digestive tract CPT copyright 2022 American Medical Association. All rights reserved. The codes documented in this report are preliminary and upon coder review may  be revised to meet current compliance requirements. Midge Minium MD, MD 08/19/2023 8:41:12 AM This report has been signed electronically. Number of Addenda: 0 Note Initiated On: 08/19/2023 8:10 AM Scope Withdrawal Time: 0 hours 7 minutes 8 seconds  Total Procedure Duration: 0 hours 15 minutes 26 seconds  Estimated Blood Loss:  Estimated blood loss: none.      [facility]

## 2023-08-19 NOTE — Op Note (Signed)
[facility]astroenterology Patient Name: Jaime Johnson Procedure Date: 08/19/2023 8:13 AM MRN: [id number] Account #: [id number] Date of Birth: 17-Feb-1961 Admit Type: Outpatient Age: 62 Room: [facility] OR ROOM 01 Gender: Female Note Status: Finalized Instrument Name: [id number] Procedure:             Upper GI endoscopy Indications:           Abnormal PET scan of the GI tract Providers:             Midge Minium MD, MD Medicines:             Propofol per Anesthesia Complications:         No immediate complications. Procedure:             Pre-Anesthesia Assessment:                        - Prior to the procedure, a History and Physical was                         performed, and patient medications and allergies were                         reviewed. The patient's tolerance of previous                         anesthesia was also reviewed. The risks and benefits                         of the procedure and the sedation options and risks                         were discussed with the patient. All questions were                         answered, and informed consent was obtained. Prior                         Anticoagulants: The patient has taken no anticoagulant                         or antiplatelet agents. ASA Grade Assessment: II - A                         patient with mild systemic disease. After reviewing                         the risks and benefits, the patient was deemed in                         satisfactory condition to undergo the procedure.                        After obtaining informed consent, the endoscope was                         passed under direct vision. Throughout the procedure,  the patient's blood pressure, pulse, and oxygen                         saturations were monitored continuously. The was                         introduced through the mouth, and advanced to the                         second part of duodenum. The  upper GI endoscopy was                         accomplished without difficulty. The patient tolerated                         the procedure well. Findings:      The examined esophagus was normal.      The stomach was normal.      One 8 mm pedunculated polyp with no bleeding was found in the duodenal       bulb. The polyp was removed with a cold snare. Resection and retrieval       were complete. Impression:            - Normal esophagus.                        - Normal stomach.                        - One duodenal polyp. Resected and retrieved. Recommendation:        - Discharge patient to home.                        - Resume previous diet.                        - Continue present medications.                        - Await pathology results.                        - Perform a colonoscopy today. Procedure Code(s):     --- Professional ---                        [phone], Esophagogastroduodenoscopy, flexible,                         transoral; with removal of tumor(s), polyp(s), or                         other lesion(s) by snare technique Diagnosis Code(s):     --- Professional ---                        R93.3, Abnormal findings on diagnostic imaging of                         other parts of digestive tract  K31.7, Polyp of stomach and duodenum CPT copyright 2022 American Medical Association. All rights reserved. The codes documented in this report are preliminary and upon coder review may  be revised to meet current compliance requirements. Midge Minium MD, MD 08/19/2023 8:22:41 AM This report has been signed electronically. Number of Addenda: 0 Note Initiated On: 08/19/2023 8:13 AM Total Procedure Duration: 0 hours 2 minutes 10 seconds  Estimated Blood Loss:  Estimated blood loss: none.      [facility]

## 2023-08-19 NOTE — Anesthesia Preprocedure Evaluation (Signed)
Anesthesia Evaluation    Airway Mallampati: II  TM Distance: >3 FB Neck ROM: Full    Dental no notable dental hx.    Pulmonary former smoker   Pulmonary exam normal breath sounds clear to auscultation       Cardiovascular hypertension, Normal cardiovascular exam Rhythm:Regular Rate:Normal     Neuro/Psych  Neuromuscular disease    GI/Hepatic   Endo/Other  Hypothyroidism    Renal/GU      Musculoskeletal   Abdominal   Peds  Hematology  (+) Blood dyscrasia, anemia   Anesthesia Other Findings   Allergic rhinitis due to pollen  Hypertension Hyperlipidemia LDL goal <100 Endometrial cancer (HCC) COVID-19Anemia Thyroid cancer   Vertigo Motion sickness      Reproductive/Obstetrics                             Anesthesia Physical Anesthesia Plan  ASA: 2  Anesthesia Plan: General   Post-op Pain Management:    Induction: Intravenous  PONV Risk Score and Plan:   Airway Management Planned: Natural Airway and Nasal Cannula  Additional Equipment:   Intra-op Plan:   Post-operative Plan:   Informed Consent: I have reviewed the patients History and Physical, chart, labs and discussed the procedure including the risks, benefits and alternatives for the proposed anesthesia with the patient or authorized representative who has indicated his/her understanding and acceptance.     Dental Advisory Given  Plan Discussed with: Anesthesiologist, CRNA and Surgeon  Anesthesia Plan Comments: (Patient consented for risks of anesthesia including but not limited to:  - adverse reactions to medications - risk of airway placement if required - damage to eyes, teeth, lips or other oral mucosa - nerve damage due to positioning  - sore throat or hoarseness - Damage to heart, brain, nerves, lungs, other parts of body or loss of life  Patient voiced understanding and assent.)       Anesthesia Quick  Evaluation

## 2023-08-19 NOTE — Anesthesia Postprocedure Evaluation (Signed)
Anesthesia Post Note  Patient: ESTELLER CAMBER  Procedure(s) Performed: COLONOSCOPY WITH PROPOFOL ESOPHAGOGASTRODUODENOSCOPY (EGD) POLYPECTOMY  Patient location during evaluation: PACU Anesthesia Type: General Level of consciousness: awake and alert Pain management: pain level controlled Vital Signs Assessment: post-procedure vital signs reviewed and stable Respiratory status: spontaneous breathing, nonlabored ventilation, respiratory function stable and patient connected to nasal cannula oxygen Cardiovascular status: blood pressure returned to baseline and stable Postop Assessment: no apparent nausea or vomiting Anesthetic complications: no   No notable events documented.   Last Vitals:  Vitals:   08/19/23 0850 08/19/23 0854  BP:  134/74  Pulse: 62 65  Resp: 20 15  Temp:    SpO2: 97% 95%    Last Pain:  Vitals:   08/19/23 0737  TempSrc: Temporal  PainSc: 5                  Jazelle Achey C Kaylinn Dedic

## 2023-08-19 NOTE — H&P (Signed)
Jaime Minium, MD [facility] [address], [address] [place], [place] [address] [phone] Fax : [phone]  Primary Care Physician:  Hannah Beat, MD Primary Gastroenterologist:  Dr. Servando Snare  Pre-Procedure History & Physical: HPI:  Jaime Johnson is a 62 y.o. female is here for an endoscopy and colonoscopy.   Past Medical History:  Diagnosis Date   Allergic rhinitis due to pollen    Anemia    COVID-19 2022   Endometrial cancer (HCC) 2023   Hyperlipidemia LDL goal <100 02/16/2020   Hypertension 12/19/2016   Motion sickness    cars   Thyroid cancer (HCC) 2024   Vertigo     Past Surgical History:  Procedure Laterality Date   ABDOMINAL HYSTERECTOMY     CESAREAN SECTION     COLONOSCOPY WITH PROPOFOL N/A 11/12/2016   Procedure: COLONOSCOPY WITH PROPOFOL;  Surgeon: Jaime Minium, MD;  Location: [facility];  Service: Endoscopy;  Laterality: N/A;   COLONOSCOPY WITH PROPOFOL N/A 03/29/2017   Procedure: COLONOSCOPY WITH PROPOFOL;  Surgeon: Jaime Minium, MD;  Location: [facility];  Service: Endoscopy;  Laterality: N/A;   COLONOSCOPY WITH PROPOFOL N/A 03/14/2020   Procedure: COLONOSCOPY WITH PROPOFOL;  Surgeon: Jaime Minium, MD;  Location: [facility];  Service: Endoscopy;  Laterality: N/A;  priority 4   CONTINUOUS NERVE MONITORING N/A 04/30/2023   Procedure: CONTINUOUS NERVE MONITORING;  Surgeon: Linus Salmons, MD;  Location: [facility];  Service: ENT;  Laterality: N/A;   IR IMAGING GUIDED PORT INSERTION  06/01/2022   IR REMOVAL TUN ACCESS W/ PORT W/O FL MOD SED  01/17/2023   POLYPECTOMY  11/12/2016   Procedure: POLYPECTOMY;  Surgeon: Jaime Minium, MD;  Location: [facility];  Service: Endoscopy;;   POLYPECTOMY  03/29/2017   Procedure: POLYPECTOMY;  Surgeon: Jaime Minium, MD;  Location: [facility];  Service: Endoscopy;;   THYROIDECTOMY N/A 04/30/2023   Procedure: THYROIDECTOMY;  Surgeon: Linus Salmons, MD;  Location: [facility] OR[facility];  Service:  ENT;  Laterality: N/A;    Prior to Admission medications   Medication Sig Start Date End Date Taking? Authorizing Provider  buPROPion (WELLBUTRIN XL) 150 MG 24 hr tablet Take 2 tablets (300 mg total) by mouth daily. 07/09/23  Yes Alinda Dooms, NP  celecoxib (CELEBREX) 200 MG capsule Take 200 mg by mouth daily.   Yes [provider]  diazepam (VALIUM) 5 MG tablet Take 1 tablet (5 mg total) by mouth daily as needed for anxiety (for dilator therapy). Patient taking differently: Take 5 mg by mouth daily as needed for anxiety (for dilator therapy). Insert vaginally 07/09/23  Yes Alinda Dooms, NP  levothyroxine (SYNTHROID) 100 MCG tablet Take 100 mcg by mouth daily.   Yes [provider]  lidocaine (XYLOCAINE) 5 % ointment Apply 1 Application topically as needed (for dilator therapy). 07/09/23  Yes Alinda Dooms, NP  Multiple Vitamins-Minerals (MULTIVITAMIN GUMMIES ADULTS PO) Take by mouth daily.   Yes [provider]  traMADol (ULTRAM) 50 MG tablet TAKE 1 TABLET(50 MG) BY MOUTH DAILY 04/09/23  Yes Copland, Karleen Hampshire, MD    Allergies as of 08/12/2023 - Review Complete 08/12/2023  Allergen Reaction Noted   Azithromycin Other (See Comments) 04/13/2019   Wound dressing adhesive Rash 04/23/2023    Family History  Problem Relation Age of Onset   Hepatitis C Mother 58       d/c at 41   Hypertension Father    Hepatitis C Brother    Breast cancer Neg Hx  Social History   Socioeconomic History   Marital status: Married    Spouse name: Not on file   Number of children: Not on file   Years of education: Not on file   Highest education level: Associate degree: occupational, Scientist, product/process development, or vocational program  Occupational History   Not on file  Tobacco Use   Smoking status: Former    Current packs/day: 0.00    Types: Cigarettes    Quit date: 10/22/1978    Years since quitting: 44.8   Smokeless tobacco: Never  Vaping Use   Vaping status: Never Used  Substance  and Sexual Activity   Alcohol use: No   Drug use: No   Sexual activity: Yes    Partners: Male  Other Topics Concern   Not on file  Social History Narrative   Not on file   Social Determinants of Health   Financial Resource Strain: Low Risk  (07/04/2023)   Overall Financial Resource Strain (CARDIA)    Difficulty of Paying Living Expenses: Not very hard  Food Insecurity: No Food Insecurity (07/04/2023)   Hunger Vital Sign    Worried About Running Out of Food in the Last Year: Never true    Ran Out of Food in the Last Year: Never true  Transportation Needs: No Transportation Needs (07/04/2023)   PRAPARE - Administrator, Civil Service (Medical): No    Lack of Transportation (Non-Medical): No  Physical Activity: Insufficiently Active (07/04/2023)   Exercise Vital Sign    Days of Exercise per Week: 2 days    Minutes of Exercise per Session: 20 min  Stress: No Stress Concern Present (07/04/2023)   [employer] of Occupational Health - Occupational Stress Questionnaire    Feeling of Stress : Only a little  Social Connections: Socially Integrated (07/04/2023)   Social Connection and Isolation Panel [NHANES]    Frequency of Communication with Friends and Family: More than three times a week    Frequency of Social Gatherings with Friends and Family: More than three times a week    Attends Religious Services: More than 4 times per year    Active Member of [employer] or Organizations: Yes    Attends Banker Meetings: 1 to 4 times per year    Marital Status: Married  Catering manager Violence: Not At Risk (04/30/2023)   Humiliation, Afraid, Rape, and Kick questionnaire    Fear of Current or Ex-Partner: No    Emotionally Abused: No    Physically Abused: No    Sexually Abused: No    Review of Systems: See HPI, otherwise negative ROS  Physical Exam: BP (!) 166/81   Pulse 69   Temp (!) 97 F (36.1 C) (Temporal)   Resp 15   Ht 5' 2.01" (1.575 m)   Wt 68 kg   LMP  02/29/2020 (Approximate) Comment: Has had random spotting; hadn't had a period 1.5 yrs prior  SpO2 98%   BMI 27.43 kg/m  General:   Alert,  pleasant and cooperative in NAD Head:  Normocephalic and atraumatic. Neck:  Supple; no masses or thyromegaly. Lungs:  Clear throughout to auscultation.    Heart:  Regular rate and rhythm. Abdomen:  Soft, nontender and nondistended. Normal bowel sounds, without guarding, and without rebound.   Neurologic:  Alert and  oriented x4;  grossly normal neurologically.  Impression/Plan: MBER GROSHANS is here for an endoscopy and colonoscopy to be performed for abnormal CT scan of the rectum and esophagus  Risks,  benefits, limitations, and alternatives regarding  endoscopy and colonoscopy have been reviewed with the patient.  Questions have been answered.  All parties agreeable.   Jaime Minium, MD  08/19/2023, 7:39 AM

## 2023-08-20 ENCOUNTER — Encounter: Payer: Self-pay | Admitting: Gastroenterology

## 2023-08-22 LAB — SURGICAL PATHOLOGY

## 2023-08-26 ENCOUNTER — Encounter: Payer: Self-pay | Admitting: Gastroenterology

## 2023-09-03 ENCOUNTER — Encounter: Payer: Self-pay | Admitting: Nurse Practitioner

## 2023-09-03 ENCOUNTER — Inpatient Hospital Stay: Payer: PPO | Attending: Oncology | Admitting: Nurse Practitioner

## 2023-09-03 VITALS — BP 138/86 | HR 69 | Temp 96.2°F | Wt 154.0 lb

## 2023-09-03 DIAGNOSIS — N905 Atrophy of vulva: Secondary | ICD-10-CM | POA: Insufficient documentation

## 2023-09-03 DIAGNOSIS — Z79899 Other long term (current) drug therapy: Secondary | ICD-10-CM | POA: Diagnosis not present

## 2023-09-03 DIAGNOSIS — N941 Unspecified dyspareunia: Secondary | ICD-10-CM | POA: Diagnosis not present

## 2023-09-03 DIAGNOSIS — N952 Postmenopausal atrophic vaginitis: Secondary | ICD-10-CM | POA: Diagnosis not present

## 2023-09-03 DIAGNOSIS — C541 Malignant neoplasm of endometrium: Secondary | ICD-10-CM | POA: Diagnosis not present

## 2023-09-03 NOTE — Progress Notes (Unsigned)
Symptom Management Clinic  [facility] Department of the [place] [address] [place], [place] [address] [phone] (phone) [phone] (fax)  Patient Care Team: Hannah Beat, MD as PCP - General (Family Medicine) Jim Like, RN as Registered Nurse Scarlett Presto, RN (Inactive) as Registered Nurse Benita Gutter, RN as Oncology Nurse Navigator Carmina Miller, MD as Consulting Physician (Radiation Oncology) Artelia Laroche, MD as Referring Physician (Obstetrics) Creig Hines, MD as Consulting Physician (Oncology)   Name of the patient: Jaime Johnson  [id number]  02-12-1961   Date of visit: 09/03/23  Diagnosis- Endometrial Cancer  Chief complaint/ Reason for visit- Dyspareunia  Heme/Onc history:  Oncology History  Endometrial carcinoma (HCC)  03/09/2022 Initial Diagnosis   Endometrial carcinoma (HCC)   05/22/2022 Cancer Staging   Staging form: Corpus Uteri - Carcinoma and Carcinosarcoma, AJCC 8th Edition - Pathologic stage from 05/22/2022: FIGO Stage IB (pT1b, pN0(i+), cM0) - Signed by Jeralyn Ruths, MD on 06/07/2022 Histologic grade (G): G2 Histologic grading system: 3 grade system Residual tumor (R): R0 - None   06/14/2022 -  Chemotherapy   Patient is on Treatment Plan : UTERINE Carboplatin AUC 6 + Paclitaxel q21d       Interval history- Patient is 62 year old female with above history of endometrial cancer, currently on surveillance, who presents to clinic for follow up for dyspareunia. She has not noticed improvement with wellbutrin. She has used vaginal valium and lidocaine and her post dilator cramping is better, no longer lasting days. She feels like when she has taken oxycodone prior to intercourse her libido and pain is much improved.    Review of systems- Review of Systems  Constitutional:  Negative for chills, fever, malaise/fatigue and weight loss.   HENT:  Negative for hearing loss, nosebleeds, sore throat and tinnitus.   Eyes:  Negative for blurred vision and double vision.  Respiratory:  Negative for cough, hemoptysis, shortness of breath and wheezing.   Cardiovascular:  Negative for chest pain, palpitations and leg swelling.  Gastrointestinal:  Negative for abdominal pain, blood in stool, constipation, diarrhea, melena, nausea and vomiting.  Genitourinary:  Negative for dysuria and urgency.  Musculoskeletal:  Negative for back pain, falls, joint pain and myalgias.  Skin:  Negative for itching and rash.  Neurological:  Negative for dizziness, tingling, sensory change, loss of consciousness, weakness and headaches.  Endo/Heme/Allergies:  Negative for environmental allergies. Does not bruise/bleed easily.  Psychiatric/Behavioral:  Positive for depression. The patient is not nervous/anxious and does not have insomnia.      Allergies  Allergen Reactions   Azithromycin Other (See Comments)    Gets a bad yeast infection from this.   Wound Dressing Adhesive Rash    bandaid    Past Medical History:  Diagnosis Date   Allergic rhinitis due to pollen    Anemia    COVID-19 2022   Endometrial cancer (HCC) 2023   Hyperlipidemia LDL goal <100 02/16/2020   Hypertension 12/19/2016   Motion sickness    cars   Thyroid cancer (HCC) 2024   Vertigo     Past Surgical History:  Procedure Laterality Date   ABDOMINAL HYSTERECTOMY     CESAREAN SECTION     COLONOSCOPY WITH PROPOFOL N/A 11/12/2016   Procedure: COLONOSCOPY WITH PROPOFOL;  Surgeon: Midge Minium, MD;  Location: [facility];  Service: Endoscopy;  Laterality: N/A;   COLONOSCOPY WITH PROPOFOL N/A 03/29/2017  Procedure: COLONOSCOPY WITH PROPOFOL;  Surgeon: Midge Minium, MD;  Location: [facility];  Service: Endoscopy;  Laterality: N/A;   COLONOSCOPY WITH PROPOFOL N/A 03/14/2020   Procedure: COLONOSCOPY WITH PROPOFOL;  Surgeon: Midge Minium, MD;  Location: [facility];  Service: Endoscopy;  Laterality: N/A;  priority 4   COLONOSCOPY WITH PROPOFOL N/A 08/19/2023   Procedure: COLONOSCOPY WITH PROPOFOL;  Surgeon: Midge Minium, MD;  Location: [facility];  Service: Endoscopy;  Laterality: N/A;   CONTINUOUS NERVE MONITORING N/A 04/30/2023   Procedure: CONTINUOUS NERVE MONITORING;  Surgeon: Linus Salmons, MD;  Location: [facility] ORS;  Service: ENT;  Laterality: N/A;   ESOPHAGOGASTRODUODENOSCOPY N/A 08/19/2023   Procedure: ESOPHAGOGASTRODUODENOSCOPY (EGD);  Surgeon: Midge Minium, MD;  Location: [facility];  Service: Endoscopy;  Laterality: N/A;   IR IMAGING GUIDED PORT INSERTION  06/01/2022   IR REMOVAL TUN ACCESS W/ PORT W/O FL MOD SED  01/17/2023   POLYPECTOMY  11/12/2016   Procedure: POLYPECTOMY;  Surgeon: Midge Minium, MD;  Location: [facility];  Service: Endoscopy;;   POLYPECTOMY  03/29/2017   Procedure: POLYPECTOMY;  Surgeon: Midge Minium, MD;  Location: [facility];  Service: Endoscopy;;   POLYPECTOMY  08/19/2023   Procedure: POLYPECTOMY;  Surgeon: Midge Minium, MD;  Location: [facility];  Service: Endoscopy;;   THYROIDECTOMY N/A 04/30/2023   Procedure: THYROIDECTOMY;  Surgeon: Linus Salmons, MD;  Location: [facility] ORS;  Service: ENT;  Laterality: N/A;    Social History   Socioeconomic History   Marital status: Married    Spouse name: Not on file   Number of children: Not on file   Years of education: Not on file   Highest education level: Associate degree: occupational, Scientist, product/process development, or vocational program  Occupational History   Not on file  Tobacco Use   Smoking status: Former    Current packs/day: 0.00    Types: Cigarettes    Quit date: 10/22/1978    Years since quitting: 44.8   Smokeless tobacco: Never  Vaping Use   Vaping status: Never Used  Substance and Sexual Activity   Alcohol use: No   Drug use: No   Sexual activity: Yes    Partners: Male  Other Topics Concern   Not on file  Social  History Narrative   Not on file   Social Determinants of Health   Financial Resource Strain: Low Risk  (07/04/2023)   Overall Financial Resource Strain (CARDIA)    Difficulty of Paying Living Expenses: Not very hard  Food Insecurity: No Food Insecurity (07/04/2023)   Hunger Vital Sign    Worried About Running Out of Food in the Last Year: Never true    Ran Out of Food in the Last Year: Never true  Transportation Needs: No Transportation Needs (07/04/2023)   PRAPARE - Administrator, Civil Service (Medical): No    Lack of Transportation (Non-Medical): No  Physical Activity: Insufficiently Active (07/04/2023)   Exercise Vital Sign    Days of Exercise per Week: 2 days    Minutes of Exercise per Session: 20 min  Stress: No Stress Concern Present (07/04/2023)   [employer] of Occupational Health - Occupational Stress Questionnaire    Feeling of Stress : Only a little  Social Connections: Socially Integrated (07/04/2023)   Social Connection and Isolation Panel [NHANES]    Frequency of Communication with Friends and Family: More than three times a week    Frequency of Social Gatherings with Friends and Family: More than  three times a week    Attends Religious Services: More than 4 times per year    Active Member of Clubs or Organizations: Yes    Attends Banker Meetings: 1 to 4 times per year    Marital Status: Married  Catering manager Violence: Not At Risk (04/30/2023)   Humiliation, Afraid, Rape, and Kick questionnaire    Fear of Current or Ex-Partner: No    Emotionally Abused: No    Physically Abused: No    Sexually Abused: No    Family History  Problem Relation Age of Onset   Hepatitis C Mother 60       d/c at 57   Hypertension Father    Hepatitis C Brother    Breast cancer Neg Hx      Current Outpatient Medications:    buPROPion (WELLBUTRIN XL) 150 MG 24 hr tablet, Take 2 tablets (300 mg total) by mouth daily., Disp: 90 tablet, Rfl: 1    celecoxib (CELEBREX) 200 MG capsule, Take 200 mg by mouth daily., Disp: , Rfl:    diazepam (VALIUM) 5 MG tablet, Take 1 tablet (5 mg total) by mouth daily as needed for anxiety (for dilator therapy). (Patient taking differently: Take 5 mg by mouth daily as needed for anxiety (for dilator therapy). Insert vaginally), Disp: 30 tablet, Rfl: 0   levothyroxine (SYNTHROID) 100 MCG tablet, Take 100 mcg by mouth daily., Disp: , Rfl:    lidocaine (XYLOCAINE) 5 % ointment, Apply 1 Application topically as needed (for dilator therapy)., Disp: 30 g, Rfl: 0   Multiple Vitamins-Minerals (MULTIVITAMIN GUMMIES ADULTS PO), Take by mouth daily., Disp: , Rfl:    traMADol (ULTRAM) 50 MG tablet, TAKE 1 TABLET(50 MG) BY MOUTH DAILY, Disp: 30 tablet, Rfl: 3  Physical exam:  Vitals:   09/03/23 0925  BP: 138/86  Pulse: 69  Temp: (!) 96.2 F (35.7 C)  TempSrc: Tympanic  SpO2: 100%  Weight: 154 lb (69.9 kg)   Physical Exam Constitutional:      Appearance: She is not ill-appearing.  Skin:    Coloration: Skin is not pale.  Neurological:     Mental Status: She is alert and oriented to person, place, and time.  Psychiatric:        Mood and Affect: Mood and affect normal.        Speech: Speech normal.        Behavior: Behavior normal.    Assessment and plan- Patient is a 62 y.o. female diagnosed with endometrial cancer who underwent surgery, chemotherapy, and radiation as part of her treatment for malignancy, now on surveillance who presents to clinic for management of post treatment effects.   Vulvar & Vaginal dryness & atrophy- due to hormonal changes as well as radiation and surgery. Manifests as pain, burning. Typically hormonally based treatments are contraindicated in endometrial cancer but given her early stage, HT would be acceptable. Barakat et al. 2006. JCO. 1236 patients, stage I-II EC randoncomized to HT vs placebo. No difference in recurrence rate (2.3% vs 1.9% NS). Was closed early due to [facility] results.  Shim et al. (2014). Eur J cancer. Meta-analysis- no increased risk of recurrence in [facility] survivors who receive HT. With that in mind, use of topical/intravaginal estrogen would be low risk. However, she is reluctant and declines. Continue topical moisturizers such as Hyalogyn daily. She will continue to use lubricant for dilator exercises. Vaginal moisturizers absorb into the skin. Lubricants do not and are slippery.  Dyspareunia- Reviewed post treatment anatomic  changes including scarring, shortening, stenosis and how this can impact sexual health. Reviewed importance of dilator exercises to keep vagina open for surveillance of her malignancy. I suspect that her pain with insertional events is related to these anatomic changes. Recommend trial of lidocaine gel 5% 30 minutes prior to intercourse, and dilator exercises. I also recommend use of intravaginal diazepam 5 mg. Apply a couple of drops of water onto the tablet before inserting high into the vagina 30 minutes prior to intercourse or dilator exercises. This will help relax the pelvic floor which may be contributing to her pain and post activity cramping. Symptoms have improved so continue plan.   Decreased libido- limited response with Scream cream/sildenafil. Adjustment disorder likely contributing as well as hormonal changes. Continue wellbutrin xl 300 mg daily. Continue exercise.   Disposition:  3- 4months - see me for follow up for dyspareunia   Visit Diagnosis 1. Dyspareunia in female    Patient expressed understanding and was in agreement with this plan. She also understands that She can call clinic at any time with any questions, concerns, or complaints.   Thank you for allowing me to participate in the care of this very pleasant patient.   Consuello Masse, DNP, AGNP-C, AOCNP Cancer Center at [facility] [phone]

## 2023-09-04 ENCOUNTER — Encounter: Payer: Self-pay | Admitting: Oncology

## 2023-09-04 ENCOUNTER — Other Ambulatory Visit: Payer: Self-pay

## 2023-09-04 DIAGNOSIS — C541 Malignant neoplasm of endometrium: Secondary | ICD-10-CM

## 2023-09-06 ENCOUNTER — Inpatient Hospital Stay: Payer: PPO

## 2023-09-06 ENCOUNTER — Inpatient Hospital Stay (HOSPITAL_BASED_OUTPATIENT_CLINIC_OR_DEPARTMENT_OTHER): Payer: PPO | Admitting: Oncology

## 2023-09-06 ENCOUNTER — Other Ambulatory Visit: Payer: Self-pay

## 2023-09-06 ENCOUNTER — Encounter: Payer: Self-pay | Admitting: Oncology

## 2023-09-06 ENCOUNTER — Ambulatory Visit: Payer: Self-pay | Admitting: Oncology

## 2023-09-06 VITALS — BP 152/82 | HR 73 | Temp 95.7°F | Resp 18 | Wt 152.6 lb

## 2023-09-06 DIAGNOSIS — C73 Malignant neoplasm of thyroid gland: Secondary | ICD-10-CM | POA: Diagnosis not present

## 2023-09-06 DIAGNOSIS — C541 Malignant neoplasm of endometrium: Secondary | ICD-10-CM

## 2023-09-06 DIAGNOSIS — N941 Unspecified dyspareunia: Secondary | ICD-10-CM | POA: Diagnosis not present

## 2023-09-06 LAB — CBC WITH DIFFERENTIAL (CANCER CENTER ONLY)
Abs Immature Granulocytes: 0.01 10*3/uL (ref 0.00–0.07)
Basophils Absolute: 0 10*3/uL (ref 0.0–0.1)
Basophils Relative: 1 %
Eosinophils Absolute: 0 10*3/uL (ref 0.0–0.5)
Eosinophils Relative: 1 %
HCT: 42.8 % (ref 36.0–46.0)
Hemoglobin: 14.3 g/dL (ref 12.0–15.0)
Immature Granulocytes: 0 %
Lymphocytes Relative: 33 %
Lymphs Abs: 1.3 10*3/uL (ref 0.7–4.0)
MCH: 27.8 pg (ref 26.0–34.0)
MCHC: 33.4 g/dL (ref 30.0–36.0)
MCV: 83.3 fL (ref 80.0–100.0)
Monocytes Absolute: 0.3 10*3/uL (ref 0.1–1.0)
Monocytes Relative: 8 %
Neutro Abs: 2.1 10*3/uL (ref 1.7–7.7)
Neutrophils Relative %: 57 %
Platelet Count: 182 10*3/uL (ref 150–400)
RBC: 5.14 MIL/uL — ABNORMAL HIGH (ref 3.87–5.11)
RDW: 12.2 % (ref 11.5–15.5)
WBC Count: 3.8 10*3/uL — ABNORMAL LOW (ref 4.0–10.5)
nRBC: 0 % (ref 0.0–0.2)

## 2023-09-06 LAB — CMP (CANCER CENTER ONLY)
ALT: 48 U/L — ABNORMAL HIGH (ref 0–44)
AST: 34 U/L (ref 15–41)
Albumin: 4.3 g/dL (ref 3.5–5.0)
Alkaline Phosphatase: 45 U/L (ref 38–126)
Anion gap: 10 (ref 5–15)
BUN: 14 mg/dL (ref 8–23)
CO2: 25 mmol/L (ref 22–32)
Calcium: 8.7 mg/dL — ABNORMAL LOW (ref 8.9–10.3)
Chloride: 104 mmol/L (ref 98–111)
Creatinine: 0.65 mg/dL (ref 0.44–1.00)
GFR, Estimated: >60 mL/min (ref >60)
Glucose, Bld: 107 mg/dL — ABNORMAL HIGH (ref 70–99)
Potassium: 3.4 mmol/L — ABNORMAL LOW (ref 3.5–5.1)
Sodium: 139 mmol/L (ref 135–145)
Total Bilirubin: 0.8 mg/dL (ref <1.2)
Total Protein: 7.1 g/dL (ref 6.5–8.1)

## 2023-09-06 MED ORDER — OXYCODONE HCL 5 MG PO TABS: 5.0000 mg | ORAL_TABLET | Freq: Every day | ORAL | 0 refills | Status: AC | PRN

## 2023-09-06 NOTE — Progress Notes (Signed)
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 Tresa Endo from GYN oncology at [facility].  She underwent robotic surgery on 03/30/2022.  Final pathology showed:    Final Pathologic Diagnosis  A. SENTINEL LYMPH NODE, RIGHT EXTERNAL ILIAC ARTERY, RESECTION: Two lymph nodes, negative for metastatic carcinoma (0/2).    B. SENTINEL LYMPH NODE, LEFT EXTERNAL ILIAC ARTERY, RESECTION: One lymph node, negative for metastatic carcinoma (0/1).    C. UTERUS, FALLOPIAN TUBE AND OVARIES, HYSTERECTOMY WITH BILATERAL SALPINGO-OOPHORECTOMY: Endometrioid adenocarcinoma, FIGO grade 2. Tumor measures 5 cm in greatest dimension.  Tumor invades 14 of 17 mm thick myometrium (greater than 50%).  Margins of resection are uninvolved by tumor.  Tumor involves lower uterine segment.  Cervix is uninvolved by tumor.  Benign right fallopian tube and ovary.  Left ovary with tumor located within lymphovascular spaces, no tissue invasion identified.  Left fallopian tube with tumor within lymphovascular spaces, no tissue invasion identified. Free  floating tumor located inside left fallopian tube.  pT1b pN0    She was recommended adjuvant chemotherapy with Ledell Noss Taxol for 3 cycles by Dr. Tresa Endo followed by vaginal brachytherapy.  Patient states that she does not wish to travel all the way to [facility] given her ongoing fatigue as well as discomfort from episiotomy and would prefer to get treatment locally.  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  She is not a diabetic.  She has baseline rheumatoid arthritis   Pathology was also second for a second opinion to [facility] as well.  Path report over there suggested that this was a T3a tumor FIGO stage IIIa disease.  Patient received 6 cycles of CarboTaxol chemotherapy as well as pelvic radiation  Patient was diagnosed with thyroidNodule which was biopsy-proven papillary thyroid carcinoma with follicular variant.  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  She follows up with endocrinology for the same    Interval history-patient currently reports dull aching pain in her pelvis as well as stiffness in her hip joints.  She has been having symptoms of dyspareuniaAs well as vaginal dryness for which she was seen by GYN oncology and NP Consuello Masse.  She uses topical moisturizers as well as dilator exercises.  States that there was a time when she took leftover oxycodone for her back spasm and did feel that it helped her dyspareunia significantly.  She was wondering if she could get oxycodone to be used sporadically prior to sexual intercourse to help with symptoms.  ECOG PS- 1 Pain scale- 3 Opioid associated constipation- no  Review of systems- Review of Systems  Constitutional:  Positive for malaise/fatigue.  Genitourinary:  Dyspareunia and vaginal dryness      Allergies  Allergen Reactions   Azithromycin Other (See Comments)    Gets a bad yeast  infection from this.   Wound Dressing Adhesive Rash    bandaid     Past Medical History:  Diagnosis Date   Allergic rhinitis due to pollen    Anemia    COVID-19 2022   Endometrial cancer (HCC) 2023   Hyperlipidemia LDL goal <100 02/16/2020   Hypertension 12/19/2016   Motion sickness    cars   Thyroid cancer (HCC) 2024   Vertigo      Past Surgical History:  Procedure Laterality Date   ABDOMINAL HYSTERECTOMY     CESAREAN SECTION     COLONOSCOPY WITH PROPOFOL N/A 11/12/2016   Procedure: COLONOSCOPY WITH PROPOFOL;  Surgeon: Midge Minium, MD;  Location: [facility];  Service: Endoscopy;  Laterality: N/A;   COLONOSCOPY WITH PROPOFOL N/A 03/29/2017   Procedure: COLONOSCOPY WITH PROPOFOL;  Surgeon: Midge Minium, MD;  Location: [facility];  Service: Endoscopy;  Laterality: N/A;   COLONOSCOPY WITH PROPOFOL N/A 03/14/2020   Procedure: COLONOSCOPY WITH PROPOFOL;  Surgeon: Midge Minium, MD;  Location: [facility];  Service: Endoscopy;  Laterality: N/A;  priority 4   COLONOSCOPY WITH PROPOFOL N/A 08/19/2023   Procedure: COLONOSCOPY WITH PROPOFOL;  Surgeon: Midge Minium, MD;  Location: [facility];  Service: Endoscopy;  Laterality: N/A;   CONTINUOUS NERVE MONITORING N/A 04/30/2023   Procedure: CONTINUOUS NERVE MONITORING;  Surgeon: Linus Salmons, MD;  Location: [facility];  Service: ENT;  Laterality: N/A;   ESOPHAGOGASTRODUODENOSCOPY N/A 08/19/2023   Procedure: ESOPHAGOGASTRODUODENOSCOPY (EGD);  Surgeon: Midge Minium, MD;  Location: [facility];  Service: Endoscopy;  Laterality: N/A;   IR IMAGING GUIDED PORT INSERTION  06/01/2022   IR REMOVAL TUN ACCESS W/ PORT W/O FL MOD SED  01/17/2023   POLYPECTOMY  11/12/2016   Procedure: POLYPECTOMY;  Surgeon: Midge Minium, MD;  Location: [facility];  Service: Endoscopy;;   POLYPECTOMY  03/29/2017   Procedure: POLYPECTOMY;  Surgeon: Midge Minium, MD;  Location: [facility];  Service: Endoscopy;;    POLYPECTOMY  08/19/2023   Procedure: POLYPECTOMY;  Surgeon: Midge Minium, MD;  Location: [facility];  Service: Endoscopy;;   THYROIDECTOMY N/A 04/30/2023   Procedure: THYROIDECTOMY;  Surgeon: Linus Salmons, MD;  Location: [facility];  Service: ENT;  Laterality: N/A;    Social History   Socioeconomic History   Marital status: Married    Spouse name: Not on file   Number of children: Not on file   Years of education: Not on file   Highest education level: Associate degree: occupational, Scientist, product/process development, or vocational program  Occupational History   Not on file  Tobacco Use   Smoking status: Former    Current packs/day: 0.00    Types: Cigarettes    Quit date: 10/22/1978    Years since quitting: 44.9   Smokeless tobacco: Never  Vaping Use   Vaping status: Never Used  Substance and Sexual Activity   Alcohol use: No   Drug use: No   Sexual activity: Yes    Partners: Male  Other Topics Concern   Not on file  Social History Narrative   Not on file   Social Determinants of Health   Financial Resource Strain: Low Risk  (07/04/2023)   Overall Financial Resource Strain (CARDIA)    Difficulty of Paying Living Expenses: Not very hard  Food Insecurity: No Food Insecurity (07/04/2023)  Hunger Vital Sign    Worried About Running Out of Food in the Last Year: Never true    Ran Out of Food in the Last Year: Never true  Transportation Needs: No Transportation Needs (07/04/2023)   PRAPARE - Administrator, Civil Service (Medical): No    Lack of Transportation (Non-Medical): No  Physical Activity: Insufficiently Active (07/04/2023)   Exercise Vital Sign    Days of Exercise per Week: 2 days    Minutes of Exercise per Session: 20 min  Stress: No Stress Concern Present (07/04/2023)   [employer] of Occupational Health - Occupational Stress Questionnaire    Feeling of Stress : Only a little  Social Connections: Socially Integrated (07/04/2023)   Social Connection and  Isolation Panel [NHANES]    Frequency of Communication with Friends and Family: More than three times a week    Frequency of Social Gatherings with Friends and Family: More than three times a week    Attends Religious Services: More than 4 times per year    Active Member of [employer] or Organizations: Yes    Attends Banker Meetings: 1 to 4 times per year    Marital Status: Married  Catering manager Violence: Not At Risk (04/30/2023)   Humiliation, Afraid, Rape, and Kick questionnaire    Fear of Current or Ex-Partner: No    Emotionally Abused: No    Physically Abused: No    Sexually Abused: No    Family History  Problem Relation Age of Onset   Hepatitis C Mother 60       d/c at 77   Hypertension Father    Hepatitis C Brother    Breast cancer Neg Hx      Current Outpatient Medications:    oxyCODONE (OXY IR/ROXICODONE) 5 MG immediate release tablet, Take 1 tablet (5 mg total) by mouth daily as needed for up to 5 days for severe pain (pain score 7-10)., Disp: 5 tablet, Rfl: 0   buPROPion (WELLBUTRIN XL) 150 MG 24 hr tablet, Take 2 tablets (300 mg total) by mouth daily., Disp: 90 tablet, Rfl: 1   celecoxib (CELEBREX) 200 MG capsule, Take 200 mg by mouth daily., Disp: , Rfl:    diazepam (VALIUM) 5 MG tablet, Take 1 tablet (5 mg total) by mouth daily as needed for anxiety (for dilator therapy). (Patient taking differently: Take 5 mg by mouth daily as needed for anxiety (for dilator therapy). Insert vaginally), Disp: 30 tablet, Rfl: 0   levothyroxine (SYNTHROID) 100 MCG tablet, Take 100 mcg by mouth daily., Disp: , Rfl:    lidocaine (XYLOCAINE) 5 % ointment, Apply 1 Application topically as needed (for dilator therapy)., Disp: 30 g, Rfl: 0   Multiple Vitamins-Minerals (MULTIVITAMIN GUMMIES ADULTS PO), Take by mouth daily., Disp: , Rfl:   Physical exam:  Vitals:   09/06/23 1025  BP: (!) 152/82  Pulse: 73  Resp: 18  Temp: (!) 95.7 F (35.4 C)  TempSrc: Tympanic  SpO2: 100%   Weight: 152 lb 9.6 oz (69.2 kg)   Physical Exam Neck:     Comments: Central scar of total thyroidectomy seen Cardiovascular:     Rate and Rhythm: Normal rate and regular rhythm.     Heart sounds: Normal heart sounds.  Pulmonary:     Effort: Pulmonary effort is normal.     Breath sounds: Normal breath sounds.  Abdominal:     General: Bowel sounds are normal. There is no distension.     Palpations:  Abdomen is soft.     Tenderness: There is no abdominal tenderness.  Skin:    General: Skin is warm and dry.  Neurological:     Mental Status: She is alert and oriented to person, place, and time.         Latest Ref Rng & Units 09/06/2023   10:03 AM  CMP  Glucose 70 - 99 mg/dL 295   BUN 8 - 23 mg/dL 14   Creatinine 6.21 - 1.00 mg/dL 3.08   Sodium 657 - 846 mmol/L 139   Potassium 3.5 - 5.1 mmol/L 3.4   Chloride 98 - 111 mmol/L 104   CO2 22 - 32 mmol/L 25   Calcium 8.9 - 10.3 mg/dL 8.7   Total Protein 6.5 - 8.1 g/dL 7.1   Total Bilirubin <9.6 mg/dL 0.8   Alkaline Phos 38 - 126 U/L 45   AST 15 - 41 U/L 34   ALT 0 - 44 U/L 48       Latest Ref Rng & Units 09/06/2023   10:03 AM  CBC  WBC 4.0 - 10.5 K/uL 3.8   Hemoglobin 12.0 - 15.0 g/dL 29.5   Hematocrit 28.4 - 46.0 % 42.8   Platelets 150 - 400 K/uL 182      Assessment and plan- Patient is a 62 y.o. female with FIGO stage Ib endometrioid endometrial cancer pT1b N0 M0. She is s/p 6 cycles of CarboTaxol chemotherapy followed by vaginal brachytherapy.  This is a routine surveillance visit  Clinically patient is doing well with no concerning signsAnd symptoms of recurrence based on today's exam.  I will see her back in 6 months with labs and she will follow-up with GYN oncology 3 months from now as well.  No role for routine imaging unless there are suspicious signs and symptoms of recurrence.  Dyspareunia: She is currently on Wellbutrin for decreased libido along with topical vaginal moisturizers and dilator exercises.  I am  okay to give her a trial of oxycodone about 4 to 5 tablets a month which she can use 30 minutes prior to a planned sexual intercourse since patient reports that symptoms were better when she took oxycodone incidentally for her back spasms and symptoms of dyspareunia were improved at that time.  Patient will let us know and if the oxycodone does not help her she does not plan to continue using the same.   Visit Diagnosis 1. Endometrial carcinoma (HCC)   2. Papillary thyroid carcinoma (HCC)   3. Dyspareunia in female      Dr. Owens Shark, MD, MPH [facility] [phone] 09/06/2023 12:36 PM

## 2023-09-08 ENCOUNTER — Other Ambulatory Visit: Payer: Self-pay

## 2023-09-21 ENCOUNTER — Other Ambulatory Visit: Payer: Self-pay

## 2023-10-22 ENCOUNTER — Telehealth: Payer: Self-pay

## 2023-10-22 MED ORDER — TRAMADOL HCL 50 MG PO TABS: 50.0000 mg | ORAL_TABLET | Freq: Three times a day (TID) | ORAL | 5 refills | Status: DC | PRN

## 2023-10-22 NOTE — Telephone Encounter (Signed)
 Copied from CRM [id number]. Topic: Clinical - Medication Refill >> Oct 22, 2023  8:56 AM Renea ORN wrote: Most Recent Primary Care Visit:  Provider: WATT MIRZA  Department: [facility]  Visit Type: OFFICE VISIT  Date: 07/08/2023  Medication: tramadol   Has the patient contacted their pharmacy? Yes (Agent: If no, request that the patient contact the pharmacy for the refill. If patient does not wish to contact the pharmacy document the reason why and proceed with request.) (Agent: If yes, when and what did the pharmacy advise?)  Is this the correct pharmacy for this prescription? Yes If no, delete pharmacy and type the correct one.  This is the patient's preferred pharmacy:   [id number] - [place], [place] - [address] AT [facility] OF [address] [place] [address] [place] [address] Phone: [phone] Fax: [phone]   Has the prescription been filled recently? Yes  Is the patient out of the medication? Yes  Has the patient been seen for an appointment in the last year OR does the patient have an upcoming appointment? Yes  Can we respond through MyChart? No  Agent: Please be advised that Rx refills may take up to 3 business days. We ask that you follow-up with your pharmacy.

## 2023-10-22 NOTE — Telephone Encounter (Signed)
Do not see tramadol on her med list.

## 2023-10-22 NOTE — Telephone Encounter (Signed)
 She is on it - somehow got removed from her med list.  Just refilled one month ago.

## 2023-11-15 DIAGNOSIS — C73 Malignant neoplasm of thyroid gland: Secondary | ICD-10-CM | POA: Diagnosis not present

## 2023-11-20 DIAGNOSIS — C73 Malignant neoplasm of thyroid gland: Secondary | ICD-10-CM | POA: Diagnosis not present

## 2023-11-20 DIAGNOSIS — E890 Postprocedural hypothyroidism: Secondary | ICD-10-CM | POA: Diagnosis not present

## 2023-11-27 ENCOUNTER — Other Ambulatory Visit: Payer: Self-pay | Admitting: Nurse Practitioner

## 2023-12-10 ENCOUNTER — Inpatient Hospital Stay: Payer: PPO | Attending: Oncology | Admitting: Nurse Practitioner

## 2023-12-10 ENCOUNTER — Encounter: Payer: Self-pay | Admitting: Nurse Practitioner

## 2023-12-10 VITALS — BP 154/85 | HR 78 | Temp 97.8°F

## 2023-12-10 DIAGNOSIS — Z9079 Acquired absence of other genital organ(s): Secondary | ICD-10-CM | POA: Insufficient documentation

## 2023-12-10 DIAGNOSIS — F39 Unspecified mood [affective] disorder: Secondary | ICD-10-CM | POA: Diagnosis not present

## 2023-12-10 DIAGNOSIS — Z8542 Personal history of malignant neoplasm of other parts of uterus: Secondary | ICD-10-CM | POA: Diagnosis not present

## 2023-12-10 DIAGNOSIS — Z90722 Acquired absence of ovaries, bilateral: Secondary | ICD-10-CM | POA: Insufficient documentation

## 2023-12-10 DIAGNOSIS — Z90710 Acquired absence of both cervix and uterus: Secondary | ICD-10-CM | POA: Insufficient documentation

## 2023-12-10 DIAGNOSIS — Z9221 Personal history of antineoplastic chemotherapy: Secondary | ICD-10-CM | POA: Insufficient documentation

## 2023-12-10 DIAGNOSIS — Z85850 Personal history of malignant neoplasm of thyroid: Secondary | ICD-10-CM | POA: Diagnosis not present

## 2023-12-10 DIAGNOSIS — Z923 Personal history of irradiation: Secondary | ICD-10-CM | POA: Insufficient documentation

## 2023-12-10 DIAGNOSIS — N941 Unspecified dyspareunia: Secondary | ICD-10-CM | POA: Diagnosis not present

## 2023-12-10 DIAGNOSIS — Z08 Encounter for follow-up examination after completed treatment for malignant neoplasm: Secondary | ICD-10-CM | POA: Diagnosis not present

## 2023-12-10 MED ORDER — PREGABALIN 50 MG PO CAPS: 50.0000 mg | ORAL_CAPSULE | Freq: Two times a day (BID) | ORAL | 0 refills | Status: DC

## 2023-12-10 MED ORDER — DIAZEPAM 5 MG PO TABS: ORAL_TABLET | ORAL | 0 refills | Status: DC

## 2023-12-10 MED ORDER — OXYCODONE HCL 5 MG PO TABS: 5.0000 mg | ORAL_TABLET | ORAL | 0 refills | Status: DC

## 2023-12-10 NOTE — Progress Notes (Signed)
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 G84P1 female who is seen in consultation from Dr. Smith Robert for endometrial cancer s/p RA-TLH-BSO with SLND and perineal repair at [facility] on 03/31/22 who presents for surveillance.  08/19/23- Colonoscopy and upper endoscopy with Dr Servando Snare - nonbleeding internal hemorrhoids - otherwise normal - no biopsies - recommend colonoscopy in 7 years - normal esophagus - normal stomach - one duodenal polyp. Resected and retrieved. Pathology:  1. Small intestine,  polyp(s), cold snare, duodenal :      - POLYPOID, ACUTELY INFLAMED HETEROTROPIC GASTRIC TISSUE WITH REACTIVE CHANGES - NEGATIVE FOR DYSPLASIA OR MALIGNANCY  - H. Pylori was negative  She had an episode of prolapse like pelvic pain earlier this week. It resolved after a couple minutes. Has not recurred. She does endorse narrowing of stools. Complains of neuropathic pain of her toes, worsens with warmth. She also feels like her muscles are tight, uncomfortable. She would like to retry lyrica which she last took in 2021 for osteoarthritis. She takes tramadol per pcp for this pain as well. Questions prescription for massage. Did not notice improvement with chiropractic or acupuncture. Continues to struggle with post diagnosis & treatment with physical, mental health, and finding her normal. Denies vaginal bleeding, spotting, or discharge. Continues dilator exercises.    Gyn Oncology History She presented as 63 year old female with PMB to gyn. Biopsy showed endometrial cancer and was referred to Dr. Tresa Endo from [facility]. She underwent RA-TLH-BSo on 03/30/2022.   Final Pathologic Diagnosis  A. SENTINEL LYMPH NODE, RIGHT EXTERNAL ILIAC ARTERY, RESECTION: Two lymph nodes, negative for metastatic carcinoma (0/2).    B. SENTINEL LYMPH NODE, LEFT  EXTERNAL ILIAC ARTERY, RESECTION: One lymph node, negative for metastatic carcinoma (0/1).    C. UTERUS, FALLOPIAN TUBE AND OVARIES, HYSTERECTOMY WITH BILATERAL SALPINGO-OOPHORECTOMY: Endometrioid adenocarcinoma, FIGO grade 2. Tumor measures 5 cm in greatest dimension.  Tumor invades 14 of 17 mm thick myometrium (greater than 50%).  Margins of resection are uninvolved by tumor.  Tumor involves lower uterine segment.  Cervix is uninvolved by tumor.  Benign right fallopian tube and ovary.  Left ovary with tumor located within lymphovascular spaces, no tissue invasion identified.  Left fallopian tube with tumor within lymphovascular spaces, no tissue invasion identified. Free floating tumor located inside left fallopian tube.  pT1b pN0   Surgery was complicated by vaginal laceration/episiotomy. During the repair, the tip of the needle broke off. Xray was performed and confirmed 'curvilinear suture needle projects over the left labial tissue/left perineum'. Needle was located using fluoroscopy and was removed in its entirety and incision was then repaired. Surgery was prolonged due to this with estimated time of ~ 7 hours.   Post op course was then complicated by fever requiring hospitalization for another week.   Dr. Tresa Endo saw patient, recommended adjuvant chemotherapy with Carbo Taxol for 3 cycles followed by vaginal brachytherapy.  Patient transferred care to [facility] and has been followed by Dr. Smith Robert with medical oncology. 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 She is not a diabetic. She has baseline rheumatoid arthritis.   Pathology was also second for a second opinion to [facility] as well. Path report suggested that this was a T3a tumor FIGO stage IIIa disease. Left ovary and fallopian tube: Positive for carcinoma, lymphovascular involvement. Microcystic elongated fragmented (MELF)  pattern of invasion and associated lymphovascular invasion.    1. Right external iliac artery sentinel  lymph node, lymphadenectomy: - Two lymph nodes, negative for malignancy (0/2).   2.  Left external iliac artery, sentinel lymph node, lymphadenectomy: - One lymph node, suspicious for isolated tumor cells, see comment.   Comment: The paraffin block was requested for keratin immunohistochemistry, but the referring institution declined the request.  Since we were unable to obtain material for immunohistochemistry, this node will be classified as negative for synoptic reporting purposes and staging.    MLH1: Retained expression (wild-type). PMS2: Retained expression (wild-type). MSH2: Retained expression (wild-type). MSH6: Retained expression (wild-type).  P53 immunohistochemistry:   Wild type pattern (stain performed at [facility] on outside block C5).   PET 06/05/22 IMPRESSION: 1. Status post hysterectomy without suspicious hypermetabolic soft tissue nodularity along the vaginal cuff to suggest residual disease. 2. No convincing evidence of hypermetabolic metastatic disease. 3. Mild mesenteric and omental stranding without abnormal FDG avid nodularity possibly reflecting postsurgical change, suggest attention on follow-up imaging. 4. Incidental 1.4 cm right thyroid nodule with slightly increased uptake. Recommend nonemergent thyroid US and biopsy of nodule if detected. Reference: J Am Coll Radiol. 2015 Feb;12(2): 143-50 5. Slightly increased background thyroid activity is nonspecific but may reflect thyroiditis recommend correlation with laboratory values. 6.  Aortic Atherosclerosis (ICD10-I70.0).  Received 3 cycles of CarboTaxol chemotherapy by Dr. Smith Robert. She required G-CSF for leukopenia.  10/23 CT scan showed no evidence of disease.  [facility] Tumor Board recommended completion of 6 cycles of carboplatin/paclitaxel followed by repeat PET versus CT scan to assess status of vaginal nodularity. If nodularity present and concerning for persistent disease (FDG avid or biopsy-proven) could either treat with  EBRT + VBT or surgical resection followed by VBT. Imaging findings could be postoperative in nature given proximity to surgery.   10/08/22 6 cycles of carbo-taxol completed  10/29/22  vaginal brachytherapy completed   01/07/23 CT C/A/P IMPRESSION: - No developing new mass lesion, fluid collection or lymph node enlargement. The areas of stranding in the mesentery are less visible today. No new areas of nodularity or ascites.  - Fatty liver infiltration.  Gallstones.  Hepatic hemangioma. - Stable left adrenal nodule when adjusted for variances in technique.  06/05/2023 CT CHEST, ABDOMEN, AND PELVIS WITH CONTRAST  IMPRESSION: 1. No evidence of metastatic disease in the chest, abdomen or pelvis. 2. Questionable asymmetric wall thickening versus underdistention of the rectum, consider correlation with direct inspection. 3. Mild symmetric distal esophageal wall thickening, correlate for esophagitis. 4. Diffuse hepatic steatosis. 5.  Aortic Atherosclerosis  Electronically Signed   By: Maudry Mayhew M.D.   On: 06/09/2023 11:44   Thyroid cancer:  08/13/22 [place] FNA BX THYROID 1ST LESION AFIRMA INDICATION: Indeterminate thyroid nodule of right inferior thyroid  04/30/2003 She had a total thyroidectomy   Thyroid, thyroidectomy, stitch on left upper pole - MULTIFOCAL, INVASIVE ENCAPSULATED FOLLICULAR VARIANT OF PAPILLARY THYROID CARCINOMA, 1.5 CM - MARGINS UNINVOLVED BY CARCINOMA (CLOSEST LESS THAN 1.0 MM AWAY) - BACKGROUND THYROID WITH ADENOMATOUS NODULAR HYPERPLASIA AND LYMPHOCYTIC THYROIDITIS - BENIGN PARATHYROID TISSUE  Genetic testing: personal h/o endometrial and thyroid cancer. MMR intact.   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   Hyperlipidemia LDL goal <100 02/16/2020   Cervical radicular pain (RIGHT) 09/29/2019   Bilateral carpal tunnel syndrome 09/29/2019   Essential hypertension 12/19/2016   Allergic rhinitis due to pollen     Past Medical History: Past Medical History:  Diagnosis Date   Allergic rhinitis due to pollen    Anemia    COVID-19 2022   Endometrial cancer (HCC) 2023   Hyperlipidemia LDL goal <100 02/16/2020   Hypertension 12/19/2016   Motion sickness    cars   Thyroid cancer (HCC) 2024   Vertigo     Past Surgical History: Past Surgical History:  Procedure Laterality Date   ABDOMINAL HYSTERECTOMY     CESAREAN SECTION     COLONOSCOPY WITH PROPOFOL N/A 11/12/2016   Procedure: COLONOSCOPY WITH PROPOFOL;  Surgeon: Midge Minium, MD;  Location: [facility];  Service: Endoscopy;  Laterality: N/A;   COLONOSCOPY WITH PROPOFOL N/A 03/29/2017   Procedure: COLONOSCOPY WITH PROPOFOL;  Surgeon: Midge Minium, MD;  Location: [facility];  Service: Endoscopy;  Laterality: N/A;   COLONOSCOPY WITH PROPOFOL N/A 03/14/2020   Procedure: COLONOSCOPY WITH PROPOFOL;  Surgeon: Midge Minium, MD;  Location: [facility];  Service: Endoscopy;  Laterality: N/A;  priority 4   COLONOSCOPY WITH PROPOFOL N/A 08/19/2023   Procedure: COLONOSCOPY WITH PROPOFOL;  Surgeon: Midge Minium, MD;  Location: [facility];  Service: Endoscopy;  Laterality: N/A;   CONTINUOUS NERVE MONITORING N/A 04/30/2023   Procedure: CONTINUOUS NERVE MONITORING;  Surgeon: Linus Salmons, MD;  Location: [facility] ORS;  Service: ENT;  Laterality: N/A;   ESOPHAGOGASTRODUODENOSCOPY N/A 08/19/2023   Procedure: ESOPHAGOGASTRODUODENOSCOPY (EGD);  Surgeon: Midge Minium, MD;  Location: [facility];  Service: Endoscopy;  Laterality: N/A;    IR IMAGING GUIDED PORT INSERTION  06/01/2022   IR REMOVAL TUN ACCESS W/ PORT W/O FL MOD SED  01/17/2023   POLYPECTOMY  11/12/2016   Procedure: POLYPECTOMY;  Surgeon: Midge Minium, MD;  Location: [facility];  Service: Endoscopy;;   POLYPECTOMY  03/29/2017   Procedure: POLYPECTOMY;  Surgeon: Midge Minium, MD;  Location: [facility];  Service: Endoscopy;;   POLYPECTOMY  08/19/2023   Procedure: POLYPECTOMY;  Surgeon: Midge Minium, MD;  Location: [facility];  Service: Endoscopy;;   THYROIDECTOMY N/A 04/30/2023   Procedure: THYROIDECTOMY;  Surgeon: Linus Salmons, MD;  Location: [facility] ORS;  Service: ENT;  Laterality: N/A;   Past Gynecologic History:  Menarche: age 65 Last pap: per hpi Post menopausal Sexually active.  Pap- 06/30/2009- NILM. HPV not performed 02/25/2007- ASCUS  OB History:  OB History  Gravida Para Term Preterm AB Living  1 1 1   1   SAB IAB Ectopic Multiple Live Births          # Outcome Date GA Lbr Len/2nd Weight Sex Type Anes PTL Lv  1 Term             Family History: Family History  Problem Relation Age of Onset   Hepatitis C Mother 61       d/c at 70   Hypertension Father    Hepatitis C Brother    Breast cancer Neg Hx     Social History: Social History   Socioeconomic History   Marital status: Married    Spouse name: Not on file   Number of children: Not on file   Years of education: Not on file  Highest education level: Associate degree: occupational, Scientist, product/process development, or vocational program  Occupational History   Not on file  Tobacco Use   Smoking status: Former    Current packs/day: 0.00    Types: Cigarettes    Quit date: 10/22/1978    Years since quitting: 45.1   Smokeless tobacco: Never  Vaping Use   Vaping status: Never Used  Substance and Sexual Activity   Alcohol use: No   Drug use: No   Sexual activity: Yes    Partners: Male  Other Topics Concern   Not on file  Social History Narrative   Not on file   Social  Drivers of Health   Financial Resource Strain: Low Risk  (07/04/2023)   Overall Financial Resource Strain (CARDIA)    Difficulty of Paying Living Expenses: Not very hard  Food Insecurity: No Food Insecurity (07/04/2023)   Hunger Vital Sign    Worried About Running Out of Food in the Last Year: Never true    Ran Out of Food in the Last Year: Never true  Transportation Needs: No Transportation Needs (07/04/2023)   PRAPARE - Administrator, Civil Service (Medical): No    Lack of Transportation (Non-Medical): No  Physical Activity: Insufficiently Active (07/04/2023)   Exercise Vital Sign    Days of Exercise per Week: 2 days    Minutes of Exercise per Session: 20 min  Stress: No Stress Concern Present (07/04/2023)   [employer] of Occupational Health - Occupational Stress Questionnaire    Feeling of Stress : Only a little  Social Connections: Socially Integrated (07/04/2023)   Social Connection and Isolation Panel [NHANES]    Frequency of Communication with Friends and Family: More than three times a week    Frequency of Social Gatherings with Friends and Family: More than three times a week    Attends Religious Services: More than 4 times per year    Active Member of [employer] or Organizations: Yes    Attends Banker Meetings: 1 to 4 times per year    Marital Status: Married  Catering manager Violence: Not At Risk (04/30/2023)   Humiliation, Afraid, Rape, and Kick questionnaire    Fear of Current or Ex-Partner: No    Emotionally Abused: No    Physically Abused: No    Sexually Abused: No    Allergies: Allergies  Allergen Reactions   Azithromycin Other (See Comments)    Gets a bad yeast infection from this.   Wound Dressing Adhesive Rash    bandaid    Objective   Today's Vitals   12/10/23 1004  BP: (!) 154/85  Pulse: 78  Temp: 97.8 F (36.6 C)  TempSrc: Tympanic  SpO2: 100%   There is no height or weight on file to calculate BMI.  GENERAL: Patient  is a well appearing female in no acute distress HEENT:  Sclera clear. Anicteric NODES:  Negative axillary, supraclavicular, inguinal lymph node survery LUNGS:  Clear to auscultation bilaterally.   HEART:  Regular rate and rhythm.  ABDOMEN:  Soft, nontender.  No hernias, incisions well healed. No masses or ascites EXTREMITIES:  No peripheral edema. Atraumatic. No cyanosis SKIN:  Clear with no obvious rashes or skin changes.  NEURO:  Nonfocal. Well oriented.  Appropriate affect.  Pelvic: Exam Chaperoned by NP EGBUS: no lesions Vagina: mildly atrophic. No lesions, discharge, or bleeding.  Cervix, Uterus- surgically absent BME: smooth, no palpable masses RV: confirmatory, no masses.   Lab Review N/A  Radiologic Imaging:  As per HPI/interval history     Assessment:  Jaime Johnson is a 63 y.o. female diagnosed with Stage IIIA (pMMR; p53wt), grade 2 endometrioid endometrial cancer.  Underwent robotic TLH/BSO SLN mapping and biopsies 03/30/22 at [facility].  Tumor measures 5 cm in greatest dimension and invades 14 of 17 mm thick myometrium (greater than 50%), cervix is uninvolved by tumor, Microcystic elongated fragmented (MELF) pattern of invasion and associated lymphovascular invasion.  Left ovary with tumor located within lymphovascular spaces, no tissue invasion identified. Left fallopian tube with tumor within lymphovascular spaces, no tissue invasion identified. Free floating tumor located inside left fallopian tube. Left external iliac artery SLN suspicious for isolated tumor cells.    PET scan 8/23 with some questionable vaginal nodularity vs post op changes.  Completed 6 cycles of CarboTaxol chemotherapy and vaginal brachytherapy 1/24.  CT scan negative 10/23.    Abdominal discomfort and bloating concerning for recurrence - imaging reassuring with CT 06/05/2023 with not evidence of metastatic disease.  Thickening of esophagus and rectum nonspecific. Rectal findings possible secondary to  prior brachytherapy radiation. Pulmonary findings normal and no radiology etiology regarding cough. 07/2023 endoscopy and colonoscopy were unremarkable.   Vaginal exam normal on exam today. NED.   Stable adrenal findings 06/05/2023 c/w benign adrenal lesion no follow up indicated  Desired sexual function.  Vaginal atrophy and some narrowing of the vaginal introitus present. Improved with dilators.   MULTIFOCAL, INVASIVE ENCAPSULATED FOLLICULAR VARIANT OF PAPILLARY THYROID CARCINOMA status post thyroidectomy. Followed by endocrinology.    Medical co-morbidities complicating care: HTN and prior abdominal surgery. There is no height or weight on file to calculate BMI.   Plan:   Problem List Items Addressed This Visit   None Visit Diagnoses       Encounter for follow-up surveillance of endometrial cancer    -  Primary   Relevant Orders   Ambulatory Referral to [facility]     Dyspareunia in female       Relevant Orders   Ambulatory Referral to [facility]     Mood disorder [facility])       Relevant Orders   Ambulatory Referral to [facility]       Clinically no evidence of disease today and she is now 1 year from completing her treatments. Imaging has been reassuring. She had colonoscopy and endoscopy for previous imaging findings as well as stool changes which was negative. We will continue to alternate visits with Dr Smith Robert every 3 months. We reviewed nccn survivorship guidelines and topics today.   We again reviewed sexual dysfunction post treatment. She continues to struggle with intimacy, body image concerns and finding normalcy as a cancer survivor. She hasn't had much response to Scream cream or wellbutrin. Intravaginal valium prior to dilator exercises has worked well and pain has mostly resolved. Refilled. We could consider intravaginal estrogen as well however, my suspicion is that counseling may be higher yield. We could also consider systemic HRT after shared decision making with her  medical oncologist, endocrinologist, and Dr. Sonia Side. She is in agreement for counseling and I've made a referral to [facility] today. Can also consider referral to specialty clinic. She requests refill of oxycodone which she takes weekly for sexual intercourse. My preference is to lean away from this as it may be dulling sensation and libido but as that is likely a more complex problem, I will refill for now.   Hair thinning- likely hormonal- thyroid and post menopausal. She can trial  topical minoxidil.   Neuropathy- present prior to chemotherapy but has worsened since chemotherapy. Symptoms wax and wane. She would like to re-trial lyrica which she last took in 2021. I'll send prescription.   She will follow-up with her other care providers regarding her other medical issues.   She will follow up with Dr Smith Robert in 3 months and rtc for follow up with gyn onc in 6 months. We can see her back sooner if she has concerning symptoms.   Consuello Masse, DNP, AGNP-C, AOCNP [facility] [phone] (clinic)

## 2023-12-11 ENCOUNTER — Other Ambulatory Visit: Payer: Self-pay

## 2023-12-11 ENCOUNTER — Inpatient Hospital Stay: Payer: PPO

## 2024-01-05 ENCOUNTER — Encounter: Payer: Self-pay | Admitting: Nurse Practitioner

## 2024-01-06 NOTE — Telephone Encounter (Signed)
 Pt has also called triage regarding same concern, please advise.

## 2024-01-08 ENCOUNTER — Encounter: Payer: Self-pay | Admitting: Oncology

## 2024-01-08 ENCOUNTER — Ambulatory Visit: Payer: PPO

## 2024-01-08 NOTE — Progress Notes (Signed)
 Patient declined [facility] on 01/01/24 via email. Patient shared she has "too much going on with family" at this time. Jaime Johnson is welcome at [facility] should she want or need support.

## 2024-01-10 ENCOUNTER — Telehealth: Payer: Self-pay | Admitting: Nurse Practitioner

## 2024-01-10 NOTE — Telephone Encounter (Signed)
 Spoke to patient by phone re: [employer]. Dr Sonia Side would like to see patient in clinic to discuss HRT vs other options. Patient in agreement. Requests ~ 6 weeks as husband is undergoing cardiac surgery. Schedule message sent.

## 2024-02-11 DIAGNOSIS — E890 Postprocedural hypothyroidism: Secondary | ICD-10-CM | POA: Diagnosis not present

## 2024-02-17 ENCOUNTER — Telehealth: Payer: Self-pay

## 2024-02-17 NOTE — Telephone Encounter (Signed)
 Clinical Social Work returned patient's phone call.  Left voicemail with contact information and request for return call.

## 2024-02-18 ENCOUNTER — Inpatient Hospital Stay: Attending: Oncology

## 2024-02-18 NOTE — Progress Notes (Signed)
 [facility] CSW Progress Note  Clinical Social Worker was contacted by patient by phone to discuss free massage therapy.  Patient has been out of treatment for fifteen months and is not eligible for the two free massages.  CSW did provide her with contact information for the [facility] Massage Therapist if she wished to pay out of pocket.    Kennth Peal, LCSW Clinical Social Worker [facility]

## 2024-03-03 ENCOUNTER — Other Ambulatory Visit: Payer: Self-pay | Admitting: Nurse Practitioner

## 2024-03-06 ENCOUNTER — Inpatient Hospital Stay: Payer: PPO | Attending: Oncology

## 2024-03-06 ENCOUNTER — Encounter: Payer: Self-pay | Admitting: Nurse Practitioner

## 2024-03-06 ENCOUNTER — Inpatient Hospital Stay (HOSPITAL_BASED_OUTPATIENT_CLINIC_OR_DEPARTMENT_OTHER): Admitting: Nurse Practitioner

## 2024-03-06 ENCOUNTER — Inpatient Hospital Stay: Attending: Oncology

## 2024-03-06 ENCOUNTER — Inpatient Hospital Stay: Payer: PPO | Admitting: Oncology

## 2024-03-06 VITALS — BP 178/110 | HR 83 | Temp 97.0°F | Resp 18 | Ht 62.01 in | Wt 151.6 lb

## 2024-03-06 DIAGNOSIS — Z08 Encounter for follow-up examination after completed treatment for malignant neoplasm: Secondary | ICD-10-CM | POA: Diagnosis not present

## 2024-03-06 DIAGNOSIS — C73 Malignant neoplasm of thyroid gland: Secondary | ICD-10-CM

## 2024-03-06 DIAGNOSIS — Z8542 Personal history of malignant neoplasm of other parts of uterus: Secondary | ICD-10-CM | POA: Diagnosis not present

## 2024-03-06 DIAGNOSIS — N941 Unspecified dyspareunia: Secondary | ICD-10-CM

## 2024-03-06 DIAGNOSIS — Z85850 Personal history of malignant neoplasm of thyroid: Secondary | ICD-10-CM | POA: Diagnosis not present

## 2024-03-06 DIAGNOSIS — Z87891 Personal history of nicotine dependence: Secondary | ICD-10-CM | POA: Insufficient documentation

## 2024-03-06 DIAGNOSIS — C541 Malignant neoplasm of endometrium: Secondary | ICD-10-CM | POA: Diagnosis not present

## 2024-03-06 DIAGNOSIS — Z79899 Other long term (current) drug therapy: Secondary | ICD-10-CM | POA: Insufficient documentation

## 2024-03-06 LAB — COMPREHENSIVE METABOLIC PANEL WITH GFR
ALT: 40 U/L (ref 0–44)
AST: 30 U/L (ref 15–41)
Albumin: 4.7 g/dL (ref 3.5–5.0)
Alkaline Phosphatase: 58 U/L (ref 38–126)
Anion gap: 12 (ref 5–15)
BUN: 10 mg/dL (ref 8–23)
CO2: 24 mmol/L (ref 22–32)
Calcium: 8.8 mg/dL — ABNORMAL LOW (ref 8.9–10.3)
Chloride: 103 mmol/L (ref 98–111)
Creatinine, Ser: 0.79 mg/dL (ref 0.44–1.00)
GFR, Estimated: >60 mL/min (ref >60)
Glucose, Bld: 102 mg/dL — ABNORMAL HIGH (ref 70–99)
Potassium: 3.3 mmol/L — ABNORMAL LOW (ref 3.5–5.1)
Sodium: 139 mmol/L (ref 135–145)
Total Bilirubin: 1 mg/dL (ref 0.0–1.2)
Total Protein: 7.7 g/dL (ref 6.5–8.1)

## 2024-03-06 LAB — CBC WITH DIFFERENTIAL/PLATELET
Abs Immature Granulocytes: 0.01 10*3/uL (ref 0.00–0.07)
Basophils Absolute: 0.1 10*3/uL (ref 0.0–0.1)
Basophils Relative: 1 %
Eosinophils Absolute: 0.1 10*3/uL (ref 0.0–0.5)
Eosinophils Relative: 1 %
HCT: 43.7 % (ref 36.0–46.0)
Hemoglobin: 15.1 g/dL — ABNORMAL HIGH (ref 12.0–15.0)
Immature Granulocytes: 0 %
Lymphocytes Relative: 32 %
Lymphs Abs: 1.7 10*3/uL (ref 0.7–4.0)
MCH: 28.2 pg (ref 26.0–34.0)
MCHC: 34.6 g/dL (ref 30.0–36.0)
MCV: 81.7 fL (ref 80.0–100.0)
Monocytes Absolute: 0.3 10*3/uL (ref 0.1–1.0)
Monocytes Relative: 6 %
Neutro Abs: 3 10*3/uL (ref 1.7–7.7)
Neutrophils Relative %: 60 %
Platelets: 193 10*3/uL (ref 150–400)
RBC: 5.35 MIL/uL — ABNORMAL HIGH (ref 3.87–5.11)
RDW: 12.3 % (ref 11.5–15.5)
WBC: 5.1 10*3/uL (ref 4.0–10.5)
nRBC: 0 % (ref 0.0–0.2)

## 2024-03-06 NOTE — Progress Notes (Signed)
 Pt states she has been having some nausea x2  months. This is new for her. Having a hard time swallowing liquids.  C/o with weather or not she needs a mammogram? She says her last one was 3 years ago.  Since spouse has been in hospital for bipass, she feels like she has to take a deep breathe at times. Not sure is holding breathe or just just much physically taking care of self and spouse.  C/o neuropathy in feet and hands getting worse.

## 2024-03-06 NOTE — Progress Notes (Signed)
 Hematology/Oncology Consult Note [facility]  [phone] [phone]  Patient Care Team: Scherrie Curt, MD as PCP - General (Family Medicine) Burnie Cartwright, RN as Registered Nurse Arlette Benders, RN (Inactive) as Registered Nurse Rochell Chroman, RN as Oncology Nurse Navigator Glenis Langdon, MD as Consulting Physician (Radiation Oncology) Nobie Batch, MD as Referring Physician (Obstetrics) Avonne Boettcher, MD as Consulting Physician (Oncology)   Name of the patient: Jaime Johnson  [id number]  1961-10-01   Date of visit: 03/06/24  Diagnosis- 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 Loetta Ringer from GYN oncology at [facility].  She underwent robotic surgery on 03/30/2022.  Final pathology showed:   Final Pathologic Diagnosis  A. SENTINEL LYMPH NODE, RIGHT EXTERNAL ILIAC ARTERY, RESECTION: Two lymph nodes, negative for metastatic carcinoma (0/2).    B. SENTINEL LYMPH NODE, LEFT EXTERNAL ILIAC ARTERY, RESECTION: One lymph node, negative for metastatic carcinoma (0/1).    C. UTERUS, FALLOPIAN TUBE AND OVARIES, HYSTERECTOMY WITH BILATERAL SALPINGO-OOPHORECTOMY: Endometrioid adenocarcinoma, FIGO grade 2. Tumor measures 5 cm in greatest dimension.  Tumor invades 14 of 17 mm thick myometrium (greater than 50%).  Margins of resection are uninvolved by tumor.  Tumor involves lower uterine segment.  Cervix is uninvolved by tumor.  Benign right fallopian tube and ovary.  Left ovary with tumor located within lymphovascular spaces, no tissue invasion identified.  Left fallopian tube with tumor within lymphovascular spaces, no tissue invasion identified. Free floating  tumor located inside left fallopian tube.  pT1b pN0  She was recommended adjuvant chemotherapy with carbo Taxol  for 3 cycles by Dr. Loetta Ringer followed by vaginal brachytherapy.  Patient states that she does not wish to travel all the way to [facility] given her ongoing fatigue as well as discomfort from episiotomy and would prefer to get treatment locally.  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  She is not a diabetic.  She has baseline rheumatoid arthritis  Pathology was also second for a second opinion to [facility] as well.  Path report over there suggested that this was a T3a tumor FIGO stage IIIa disease.  Patient received 6 cycles of CarboTaxol chemotherapy as well as pelvic radiation  Patient was diagnosed with thyroid  Nodule which was biopsy- proven papillary thyroid  carcinoma with follicular variant.  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  She follows up with endocrinology for the same    Interval history- Jaime Johnson is a 63 y.o. female with above history of endometrial cancer who returns to clinic for routine follow up. Her husband has had cardiac surgery and continues to recover. She has been taking care of him.     ECOG PS- 1 Pain scale- 3 Opioid associated constipation- no  Review of systems- Review of Systems  Constitutional:  Positive for malaise/fatigue. Negative for chills, fever and weight loss.  Respiratory:  Negative for cough and shortness of breath.   Cardiovascular:  Negative for chest pain, palpitations and orthopnea.  Gastrointestinal:  Negative for abdominal pain, blood in stool, constipation, diarrhea, melena and nausea.  Genitourinary:  Negative for dysuria, flank pain, frequency and urgency.  Dyspareunia and vaginal dryness  Musculoskeletal:  Positive for joint pain. Negative for falls and myalgias.  Skin:   Negative for itching and rash.  Neurological:  Negative for dizziness, focal weakness and headaches.  Psychiatric/Behavioral:  Negative for depression. The patient is not nervous/anxious.     Allergies  Allergen Reactions   Azithromycin Other (See Comments)    Gets a bad yeast infection from this.   Wound Dressing Adhesive Rash    bandaid   Past Medical History:  Diagnosis Date   Allergic rhinitis due to pollen    Anemia    COVID-19 2022   Endometrial cancer (HCC) 2023   Hyperlipidemia LDL goal <100 02/16/2020   Hypertension 12/19/2016   Motion sickness    cars   Thyroid  cancer (HCC) 2024   Vertigo    Past Surgical History:  Procedure Laterality Date   ABDOMINAL HYSTERECTOMY     CESAREAN SECTION     COLONOSCOPY WITH PROPOFOL  N/A 11/12/2016   Procedure: COLONOSCOPY WITH PROPOFOL ;  Surgeon: Marnee Sink, MD;  Location: [facility];  Service: Endoscopy;  Laterality: N/A;   COLONOSCOPY WITH PROPOFOL  N/A 03/29/2017   Procedure: COLONOSCOPY WITH PROPOFOL ;  Surgeon: Marnee Sink, MD;  Location: [facility];  Service: Endoscopy;  Laterality: N/A;   COLONOSCOPY WITH PROPOFOL  N/A 03/14/2020   Procedure: COLONOSCOPY WITH PROPOFOL ;  Surgeon: Marnee Sink, MD;  Location: [facility];  Service: Endoscopy;  Laterality: N/A;  priority 4   COLONOSCOPY WITH PROPOFOL  N/A 08/19/2023   Procedure: COLONOSCOPY WITH PROPOFOL ;  Surgeon: Marnee Sink, MD;  Location: [facility];  Service: Endoscopy;  Laterality: N/A;   CONTINUOUS NERVE MONITORING N/A 04/30/2023   Procedure: CONTINUOUS NERVE MONITORING;  Surgeon: Lesly Raspberry, MD;  Location: [facility];  Service: ENT;  Laterality: N/A;   ESOPHAGOGASTRODUODENOSCOPY N/A 08/19/2023   Procedure: ESOPHAGOGASTRODUODENOSCOPY (EGD);  Surgeon: Marnee Sink, MD;  Location: [facility];  Service: Endoscopy;  Laterality: N/A;   IR IMAGING GUIDED PORT INSERTION  06/01/2022   IR REMOVAL TUN ACCESS W/ PORT W/O FL MOD SED   01/17/2023   POLYPECTOMY  11/12/2016   Procedure: POLYPECTOMY;  Surgeon: Marnee Sink, MD;  Location: [facility];  Service: Endoscopy;;   POLYPECTOMY  03/29/2017   Procedure: POLYPECTOMY;  Surgeon: Marnee Sink, MD;  Location: [facility];  Service: Endoscopy;;   POLYPECTOMY  08/19/2023   Procedure: POLYPECTOMY;  Surgeon: Marnee Sink, MD;  Location: [facility];  Service: Endoscopy;;   THYROIDECTOMY N/A 04/30/2023   Procedure: THYROIDECTOMY;  Surgeon: Lesly Raspberry, MD;  Location: [facility];  Service: ENT;  Laterality: N/A;   Social History   Socioeconomic History   Marital status: Married    Spouse name: Not on file   Number of children: Not on file   Years of education: Not on file   Highest education level: Associate degree: occupational, Scientist, product/process development, or vocational program  Occupational History   Not on file  Tobacco Use   Smoking status: Former    Current packs/day: 0.00    Types: Cigarettes    Quit date: 10/22/1978    Years since quitting: 45.4   Smokeless tobacco: Never  Vaping Use   Vaping status: Never Used  Substance and Sexual Activity   Alcohol use: No   Drug use: No   Sexual activity: Yes    Partners: Male  Other Topics Concern   Not on file  Social History Narrative   Not on file   Social Drivers of Health  Financial Resource Strain: Low Risk  (07/04/2023)   Overall Financial Resource Strain (CARDIA)    Difficulty of Paying Living Expenses: Not very hard  Food Insecurity: No Food Insecurity (07/04/2023)   Hunger Vital Sign    Worried About Running Out of Food in the Last Year: Never true    Ran Out of Food in the Last Year: Never true  Transportation Needs: No Transportation Needs (07/04/2023)   PRAPARE - Administrator, Civil Service (Medical): No    Lack of Transportation (Non-Medical): No  Physical Activity: Insufficiently Active (07/04/2023)   Exercise Vital Sign    Days of Exercise per Week: 2 days    Minutes of  Exercise per Session: 20 min  Stress: No Stress Concern Present (07/04/2023)   [employer] of Occupational Health - Occupational Stress Questionnaire    Feeling of Stress : Only a little  Social Connections: Socially Integrated (07/04/2023)   Social Connection and Isolation Panel [NHANES]    Frequency of Communication with Friends and Family: More than three times a week    Frequency of Social Gatherings with Friends and Family: More than three times a week    Attends Religious Services: More than 4 times per year    Active Member of [employer] or Organizations: Yes    Attends Banker Meetings: 1 to 4 times per year    Marital Status: Married  Catering manager Violence: Not At Risk (04/30/2023)   Humiliation, Afraid, Rape, and Kick questionnaire    Fear of Current or Ex-Partner: No    Emotionally Abused: No    Physically Abused: No    Sexually Abused: No   Family History  Problem Relation Age of Onset   Hepatitis C Mother 60       d/c at 44   Hypertension Father    Hepatitis C Brother    Breast cancer Neg Hx    Current Outpatient Medications:    buPROPion  (WELLBUTRIN  XL) 150 MG 24 hr tablet, Take 2 tablets (300 mg total) by mouth daily., Disp: 90 tablet, Rfl: 1   celecoxib (CELEBREX) 200 MG capsule, Take 200 mg by mouth daily., Disp: , Rfl:    diazepam  (VALIUM ) 5 MG tablet, Apply few drops of water  to tablet prior to inserting 1 tablet (5 mg) high into vagina 30-45 minutes prior to dilator exercises 3-4 times a week., Disp: 30 tablet, Rfl: 0   levothyroxine (SYNTHROID) 100 MCG tablet, Take 100 mcg by mouth daily., Disp: , Rfl:    lidocaine  (XYLOCAINE ) 5 % ointment, Apply 1 Application topically as needed (for dilator therapy)., Disp: 30 g, Rfl: 0   Multiple Vitamins-Minerals (MULTIVITAMIN GUMMIES ADULTS PO), Take by mouth daily., Disp: , Rfl:    oxyCODONE  (OXY IR/ROXICODONE ) 5 MG immediate release tablet, Take 1 tablet (5 mg total) by mouth once a week., Disp: 5 tablet,  Rfl: 0   pregabalin  (LYRICA ) 50 MG capsule, Take 1 capsule (50 mg total) by mouth 2 (two) times daily. For neuropathic pain post chemotherapy, Disp: 60 capsule, Rfl: 0   traMADol  (ULTRAM ) 50 MG tablet, Take 1 tablet (50 mg total) by mouth every 8 (eight) hours as needed for moderate pain (pain score 4-6)., Disp: 30 tablet, Rfl: 5  Physical exam:  Vitals:   03/06/24 1422 03/06/24 1435  BP: (!) 160/110 (!) 178/110  Pulse: 83   Resp: 18   Temp: (!) 97 F (36.1 C)   TempSrc: Tympanic   SpO2: 99%   Weight: 151  lb 9.6 oz (68.8 kg)   Height: 5' 2.01 (1.575 m)    Physical Exam Vitals reviewed.  Constitutional:      Appearance: She is not ill-appearing.  Neck:     Comments: Central scar of total thyroidectomy seen Cardiovascular:     Rate and Rhythm: Normal rate and regular rhythm.  Pulmonary:     Effort: Pulmonary effort is normal.  Abdominal:     General: There is no distension.     Palpations: Abdomen is soft.     Tenderness: There is no abdominal tenderness.  Musculoskeletal:        General: No deformity.  Skin:    General: Skin is warm.  Neurological:     Mental Status: She is alert and oriented to person, place, and time.  Psychiatric:        Mood and Affect: Mood normal.        Behavior: Behavior normal.        Latest Ref Rng & Units 03/06/2024    2:13 PM  CMP  Glucose 70 - 99 mg/dL 161   BUN 8 - 23 mg/dL 10   Creatinine 0.96 - 1.00 mg/dL 0.45   Sodium 409 - 811 mmol/L 139   Potassium 3.5 - 5.1 mmol/L 3.3   Chloride 98 - 111 mmol/L 103   CO2 22 - 32 mmol/L 24   Calcium  8.9 - 10.3 mg/dL 8.8   Total Protein 6.5 - 8.1 g/dL 7.7   Total Bilirubin 0.0 - 1.2 mg/dL 1.0   Alkaline Phos 38 - 126 U/L 58   AST 15 - 41 U/L 30   ALT 0 - 44 U/L 40       Latest Ref Rng & Units 03/06/2024    2:13 PM  CBC  WBC 4.0 - 10.5 K/uL 5.1   Hemoglobin 12.0 - 15.0 g/dL 91.4   Hematocrit 78.2 - 46.0 % 43.7   Platelets 150 - 400 K/uL 193    Assessment and plan- Patient is a 63  y.o. female with   Stage Ib endometrioid endometrial cancer pT1b N0 M0 - s/p 6 cycles of CarboTaxol chemotherapy followed by vaginal brachytherapy. NED since. We again reviewed signs and/or symptoms concerning for recurrence and would warrant sooner return. Otherwise, she is now > 1 year since completing treatment for her endometrial cancer. We will continue 3-4 month surveillance visits and can alternate with gyn onc. Reviewed no role for routine imaging unless there are suspicious signs or symptoms.  Dyspareunia- on wellbutrin  for decreased libido along with topical vaginal moisturizers and dilator exercises. She previously took oxycodone  incidentally for back pain and it improved her dyspareunia symptoms. She has been receiving 4-5 tablets of oxycodone  per month with improvement in symptoms. Discussed that this is not considered typical treatment but she has had symptom control so ok to continue for now. Question if impacting psychological component or comorbidity of back pain impacting intercourse? Unclear. She wishes to discuss systemic hormonal therapy with Dr Randalyn Bushman and potential impact on her history of endometrial cancer for her ongoing constitutional symptoms of menopause. Plan to re-visit vaginal valium .  History of Thyroid  cancer- history of papillary thyroid  carcinoma s/p total thyroidectomy 7.9/24 with Dr Silvestre Drum. 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 Thyroglobulin unreliable d/t presence of antibodies. Plan for neck ultrasound every 6 mo to screen for residual/recurrent disease. She is followed by Dr Monta Anton with [facility].   Disposition:  Gyn onc in next 1-2 months for pelvic exam  and to discuss HRT  F/u with med-onc in 6 mo- la  Visit Diagnosis 1. Encounter for follow-up surveillance of endometrial cancer   2. Dyspareunia, female    Kenney Peacemaker, DNP, AGNP-C, [facility] at [facility] [phone] (clinic) 03/06/2024

## 2024-03-07 ENCOUNTER — Other Ambulatory Visit: Payer: Self-pay

## 2024-03-09 ENCOUNTER — Encounter: Payer: Self-pay | Admitting: Oncology

## 2024-03-11 ENCOUNTER — Inpatient Hospital Stay

## 2024-03-20 ENCOUNTER — Other Ambulatory Visit: Payer: Self-pay

## 2024-04-01 ENCOUNTER — Encounter: Payer: Self-pay | Admitting: Oncology

## 2024-04-01 ENCOUNTER — Inpatient Hospital Stay: Attending: Oncology | Admitting: Obstetrics and Gynecology

## 2024-04-01 VITALS — BP 151/97 | HR 77 | Resp 18 | Ht 62.0 in | Wt 155.0 lb

## 2024-04-01 DIAGNOSIS — Z9079 Acquired absence of other genital organ(s): Secondary | ICD-10-CM | POA: Diagnosis not present

## 2024-04-01 DIAGNOSIS — Z90722 Acquired absence of ovaries, bilateral: Secondary | ICD-10-CM | POA: Diagnosis not present

## 2024-04-01 DIAGNOSIS — N941 Unspecified dyspareunia: Secondary | ICD-10-CM

## 2024-04-01 DIAGNOSIS — Z90710 Acquired absence of both cervix and uterus: Secondary | ICD-10-CM | POA: Insufficient documentation

## 2024-04-01 DIAGNOSIS — G620 Drug-induced polyneuropathy: Secondary | ICD-10-CM | POA: Diagnosis not present

## 2024-04-01 DIAGNOSIS — N952 Postmenopausal atrophic vaginitis: Secondary | ICD-10-CM | POA: Diagnosis not present

## 2024-04-01 DIAGNOSIS — Z8542 Personal history of malignant neoplasm of other parts of uterus: Secondary | ICD-10-CM | POA: Insufficient documentation

## 2024-04-01 DIAGNOSIS — Z08 Encounter for follow-up examination after completed treatment for malignant neoplasm: Secondary | ICD-10-CM

## 2024-04-01 DIAGNOSIS — Z923 Personal history of irradiation: Secondary | ICD-10-CM | POA: Insufficient documentation

## 2024-04-01 DIAGNOSIS — M545 Low back pain, unspecified: Secondary | ICD-10-CM

## 2024-04-01 DIAGNOSIS — Z9221 Personal history of antineoplastic chemotherapy: Secondary | ICD-10-CM | POA: Diagnosis not present

## 2024-04-01 DIAGNOSIS — N898 Other specified noninflammatory disorders of vagina: Secondary | ICD-10-CM

## 2024-04-01 NOTE — Progress Notes (Signed)
 Nausea..vomit x 1 morning, not sleeping well, urinary freq/urgency

## 2024-04-01 NOTE — Patient Instructions (Signed)
 Please follow up with your primary care doctor and endocrinologist to discuss your thyroid  and calcium  tests which are low.   Please use the vaginal moisturizer daily and follow up with Kenney Peacemaker, NP  CT scan Chest/abdomen/pelvis ordered.

## 2024-04-01 NOTE — Progress Notes (Signed)
 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  Her main issues are mild back pain mid back without sciatica, skin issues that she relates due to her thyroid  and increased neuropathy involving her feet.  She still is pretty active and was playing pickle ball yesterday.  She is using vaginal moisturizers and is able to use the dilators without difficulty.  She is sexually active but there is some dyspareunia.  She asked about hormone replacement therapy due to hot flashes.  She has been on vaginal estrogen.   Gyn Onc History: Patient is a pleasant female who is seen in consultation from Dr. Randy Buttery for endometrial cancer s/p RA-TLH-BSO with SLND and perineal repair at [facility] on 03/31/22.   She presented with PMB to gyn. Biopsy showed endometrial cancer and was referred to Dr. Loetta Ringer from [facility]. She underwent RA-TLH-BSo on 03/30/2022.   Final Pathologic Diagnosis  A. SENTINEL LYMPH NODE, RIGHT EXTERNAL ILIAC ARTERY, RESECTION: Two lymph nodes, negative for metastatic carcinoma (0/2).    B. SENTINEL LYMPH NODE, LEFT EXTERNAL ILIAC ARTERY, RESECTION: One lymph node, negative for metastatic carcinoma (0/1).    C. UTERUS, FALLOPIAN TUBE AND OVARIES, HYSTERECTOMY WITH BILATERAL  SALPINGO-OOPHORECTOMY: Endometrioid adenocarcinoma, FIGO grade 2. Tumor measures 5 cm in greatest dimension.  Tumor invades 14 of 17 mm thick myometrium (greater than 50%).  Margins of resection are uninvolved by tumor.  Tumor involves lower uterine segment.  Cervix is uninvolved by tumor.  Benign right fallopian tube and ovary.  Left ovary with tumor located within lymphovascular spaces, no tissue invasion identified.  Left fallopian tube with tumor within lymphovascular spaces, no tissue invasion identified. Free floating tumor located inside left fallopian tube.  pT1b pN0   Surgery was complicated by vaginal laceration/episiotomy. During the repair, the tip of the needle broke off. Xray was performed and confirmed 'curvilinear suture needle projects over the left labial tissue/left perineum'. Needle was located using fluoroscopy and was removed in its entirety and incision was then repaired. Surgery was prolonged due to this with estimated time of ~ 7 hours.    Post op course was then complicated by fever requiring hospitalization for another week.    Dr. Loetta Ringer saw patient, recommended adjuvant chemotherapy with Carbo Taxol  for 3 cycles followed by vaginal brachytherapy.   Patient transferred care to [facility] and has been followed by Dr. Randy Buttery with medical oncology. 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 She is not a diabetic. She has baseline rheumatoid arthritis.   Pathology was also second for a second opinion to [facility] as well. Path report suggested that this was a T3a tumor FIGO stage IIIa disease. Left ovary and fallopian tube: Positive for carcinoma, lymphovascular involvement. Microcystic elongated fragmented (MELF) pattern of invasion and associated lymphovascular invasion.     1. Right external iliac artery sentinel lymph node, lymphadenectomy: -  Two lymph nodes, negative for malignancy (0/2).   2.  Left external iliac artery, sentinel lymph node, lymphadenectomy: - One  lymph node, suspicious for isolated tumor cells, see comment.   Comment: The paraffin block was requested for keratin immunohistochemistry, but the referring institution declined the request.  Since we were unable to obtain material for immunohistochemistry, this node will be classified as negative for synoptic reporting purposes and staging.    MLH1: Retained expression (wild-type). PMS2: Retained expression (wild-type). MSH2: Retained expression (wild-type). MSH6: Retained expression (wild-type).  P53 immunohistochemistry:   Wild type pattern (stain performed at [facility] on outside block C5).    PET 06/05/22 IMPRESSION: 1. Status post hysterectomy without suspicious hypermetabolic soft tissue nodularity along the vaginal cuff to suggest residual disease. 2. No convincing evidence of hypermetabolic metastatic disease. 3. Mild mesenteric and omental stranding without abnormal FDG avid nodularity possibly reflecting postsurgical change, suggest attention on follow-up imaging. 4. Incidental 1.4 cm right thyroid  nodule with slightly increased uptake. Recommend nonemergent thyroid  US  and biopsy of nodule if detected. Reference: J Am Coll Radiol. 2015 Feb;12(2): 143-50 5. Slightly increased background thyroid  activity is nonspecific but may reflect thyroiditis recommend correlation with laboratory values. 6.  Aortic Atherosclerosis (ICD10-I70.0).   Received 3 cycles of CarboTaxol chemotherapy by Dr. Randy Buttery. She required G-CSF for leukopenia.   10/23 CT scan showed no evidence of disease.   [facility] Tumor Board recommended completion of 6 cycles of carboplatin /paclitaxel  followed by repeat PET versus CT scan to assess status of vaginal nodularity. If nodularity present and concerning for persistent disease (FDG avid or biopsy-proven) could either treat with EBRT + VBT or surgical resection followed by VBT. Imaging findings could be postoperative in nature given proximity to surgery.    10/08/22 6 cycles of  carbo-taxol  completed   10/29/22  vaginal brachytherapy completed    01/07/23 CT C/A/P IMPRESSION: - No developing new mass lesion, fluid collection or lymph node enlargement. The areas of stranding in the mesentery are less visible today. No new areas of nodularity or ascites.  - Fatty liver infiltration.  Gallstones.  Hepatic hemangioma. - Stable left adrenal nodule when adjusted for variances in technique.   06/05/2023 CT CHEST, ABDOMEN, AND PELVIS WITH CONTRAST  IMPRESSION: 1. No evidence of metastatic disease in the chest, abdomen or pelvis. 2. Questionable asymmetric wall thickening versus underdistention of the rectum, consider correlation with direct inspection. 3. Mild symmetric distal esophageal wall thickening, correlate for esophagitis. 4. Diffuse hepatic steatosis. 5.  Aortic Atherosclerosis      She has had issues with sexual function and uses vaginal dilator therapy, vaginal moisturizers and has used vaginal estrogen.    Allergies  Allergen Reactions   Azithromycin Other (See Comments)    Gets a bad yeast infection from this.   Wound Dressing Adhesive Rash    bandaid   Past Medical History:  Diagnosis Date   Allergic rhinitis due to pollen    Anemia    COVID-19 2022   Endometrial cancer (HCC) 2023   Hyperlipidemia LDL goal <100 02/16/2020   Hypertension 12/19/2016   Motion sickness    cars   Thyroid  cancer (HCC) 2024   Vertigo    Past Surgical History:  Procedure Laterality Date   ABDOMINAL HYSTERECTOMY     CESAREAN SECTION     COLONOSCOPY WITH PROPOFOL  N/A 11/12/2016   Procedure: COLONOSCOPY WITH PROPOFOL ;  Surgeon: Marnee Sink, MD;  Location: [facility];  Service: Endoscopy;  Laterality: N/A;   COLONOSCOPY WITH PROPOFOL   N/A 03/29/2017   Procedure: COLONOSCOPY WITH PROPOFOL ;  Surgeon: Marnee Sink, MD;  Location: [facility];  Service: Endoscopy;  Laterality: N/A;   COLONOSCOPY WITH PROPOFOL  N/A 03/14/2020   Procedure: COLONOSCOPY WITH  PROPOFOL ;  Surgeon: Marnee Sink, MD;  Location: [facility];  Service: Endoscopy;  Laterality: N/A;  priority 4   COLONOSCOPY WITH PROPOFOL  N/A 08/19/2023   Procedure: COLONOSCOPY WITH PROPOFOL ;  Surgeon: Marnee Sink, MD;  Location: [facility] SURGERY [facility]TR;  Service: Endoscopy;  Laterality: N/A;   CONTINUOUS NERVE MONITORING N/A 04/30/2023   Procedure: CONTINUOUS NERVE MONITORING;  Surgeon: Lesly Raspberry, MD;  Location: [facility] ORS;  Service: ENT;  Laterality: N/A;   ESOPHAGOGASTRODUODENOSCOPY N/A 08/19/2023   Procedure: ESOPHAGOGASTRODUODENOSCOPY (EGD);  Surgeon: Marnee Sink, MD;  Location: [facility];  Service: Endoscopy;  Laterality: N/A;   IR IMAGING GUIDED PORT INSERTION  06/01/2022   IR REMOVAL TUN ACCESS W/ PORT W/O FL MOD SED  01/17/2023   POLYPECTOMY  11/12/2016   Procedure: POLYPECTOMY;  Surgeon: Marnee Sink, MD;  Location: [facility];  Service: Endoscopy;;   POLYPECTOMY  03/29/2017   Procedure: POLYPECTOMY;  Surgeon: Marnee Sink, MD;  Location: [facility];  Service: Endoscopy;;   POLYPECTOMY  08/19/2023   Procedure: POLYPECTOMY;  Surgeon: Marnee Sink, MD;  Location: [facility]ERY [facility];  Service: Endoscopy;;   THYROIDECTOMY N/A 04/30/2023   Procedure: THYROIDECTOMY;  Surgeon: Lesly Raspberry, MD;  Location: [facility] ORS;  Service: ENT;  Laterality: N/A;   Social History   Socioeconomic History   Marital status: Married    Spouse name: Not on file   Number of children: Not on file   Years of education: Not on file   Highest education level: Associate degree: occupational, Scientist, product/process development, or vocational program  Occupational History   Not on file  Tobacco Use   Smoking status: Former    Current packs/day: 0.00    Types: Cigarettes    Quit date: 10/22/1978    Years since quitting: 45.4   Smokeless tobacco: Never  Vaping Use   Vaping status: Never Used  Substance and Sexual Activity   Alcohol use: No   Drug use: No   Sexual activity: Yes     Partners: Male  Other Topics Concern   Not on file  Social History Narrative   Not on file   Social Drivers of Health   Financial Resource Strain: Low Risk  (07/04/2023)   Overall Financial Resource Strain (CARDIA)    Difficulty of Paying Living Expenses: Not very hard  Food Insecurity: No Food Insecurity (07/04/2023)   Hunger Vital Sign    Worried About Running Out of Food in the Last Year: Never true    Ran Out of Food in the Last Year: Never true  Transportation Needs: No Transportation Needs (07/04/2023)   PRAPARE - Administrator, Civil Service (Medical): No    Lack of Transportation (Non-Medical): No  Physical Activity: Insufficiently Active (07/04/2023)   Exercise Vital Sign    Days of Exercise per Week: 2 days    Minutes of Exercise per Session: 20 min  Stress: No Stress Concern Present (07/04/2023)   [employer] of Occupational Health - Occupational Stress Questionnaire    Feeling of Stress : Only a little  Social Connections: Socially Integrated (07/04/2023)   Social Connection and Isolation Panel [NHANES]    Frequency of Communication with Friends and Family: More than three times a week    Frequency of Social Gatherings with Friends and  Family: More than three times a week    Attends Religious Services: More than 4 times per year    Active Member of Clubs or Organizations: Yes    Attends Banker Meetings: 1 to 4 times per year    Marital Status: Married  Catering manager Violence: Not At Risk (04/30/2023)   Humiliation, Afraid, Rape, and Kick questionnaire    Fear of Current or Ex-Partner: No    Emotionally Abused: No    Physically Abused: No    Sexually Abused: No   Family History  Problem Relation Age of Onset   Hepatitis C Mother 60       d/c at 51   Hypertension Father    Hepatitis C Brother    Breast cancer Neg Hx    Current Outpatient Medications:    buPROPion  (WELLBUTRIN  XL) 150 MG 24 hr tablet, Take 2 tablets (300 mg total)  by mouth daily., Disp: 90 tablet, Rfl: 1   celecoxib (CELEBREX) 200 MG capsule, Take 200 mg by mouth daily., Disp: , Rfl:    diazepam  (VALIUM ) 5 MG tablet, APPLY FEW DROPS OF WATER  TO TABLET PRIOR TO INSERTING 1 TABLET VAGINALLY 30-45 MINUTES PRIOR TO DILATOR EXERCISE 3-4 TIMES A WEEK AS DIRECTED, Disp: 30 tablet, Rfl: 0   levothyroxine (SYNTHROID) 100 MCG tablet, Take 100 mcg by mouth daily., Disp: , Rfl:    lidocaine  (XYLOCAINE ) 5 % ointment, Apply 1 Application topically as needed (for dilator therapy)., Disp: 30 g, Rfl: 0   Multiple Vitamins-Minerals (MULTIVITAMIN GUMMIES ADULTS PO), Take by mouth daily., Disp: , Rfl:    pregabalin  (LYRICA ) 50 MG capsule, Take 1 capsule (50 mg total) by mouth 2 (two) times daily. For neuropathic pain post chemotherapy, Disp: 60 capsule, Rfl: 0   traMADol  (ULTRAM ) 50 MG tablet, Take 1 tablet (50 mg total) by mouth every 8 (eight) hours as needed for moderate pain (pain score 4-6)., Disp: 30 tablet, Rfl: 5   General: no complaints  HEENT: no complaints  Lungs: no complaints  Cardiac: no complaints  GI: no complaints  GU: no complaints GYN: Dyspareunia and hot flashes  Musculoskeletal: low midline back pain  Extremities: no complaints  Skin: no complaints  Neuro: Neuropathy symptoms  Endocrine: no complaints  Psych: no complaints      Physical exam BP (!) 151/97 (BP Location: Right Arm, Patient Position: Sitting)   Pulse 77   Resp 18   Ht 5' 2 (1.575 m)   Wt 155 lb (70.3 kg)   LMP 02/29/2020 (Approximate) Comment: Has had random spotting; hadn't had a period 1.5 yrs prior  SpO2 99%   BMI 28.35 kg/m   PERFORMANCE STATUS: 1  GENERAL: Patient is a well appearing female in no acute distress HEENT: Atraumatic normocephalic NODES:  No inguinal lymphadenopathy palpated.  ABDOMEN:  Soft, nontender.  Nondistended.  No ascites or masses EXTREMITIES:  No peripheral edema.   NEURO:  Nonfocal. Well oriented.  Appropriate affect.  Pelvic: Chaperoned  by CMA EGBUS: no lesions Cervix: Absent Vagina: no lesions, no discharge or bleeding. Atrophic. On palpation there is scarring evident at the upper vaginal apex. Uterus: Absent BME: no palpable masses  Lab Review N/A   Radiologic Imaging: As per HPI/interval history     Assessment:  Jaime Johnson is a 63 y.o. female diagnosed with Stage IIIA (pMMR; p53wt), grade 2 endometrioid endometrial cancer.  Underwent robotic TLH/BSO SLN mapping and biopsies 03/30/22 at [facility].  Tumor measures 5 cm in greatest  dimension and invades 14 of 17 mm thick myometrium (greater than 50%), cervix is uninvolved by tumor, Microcystic elongated fragmented (MELF) pattern of invasion and associated lymphovascular invasion.  Left ovary with tumor located within lymphovascular spaces, no tissue invasion identified. Left fallopian tube with tumor within lymphovascular spaces, no tissue invasion identified. Free floating tumor located inside left fallopian tube. Left external iliac artery SLN suspicious for isolated tumor cells.     PET scan 8/23 with some questionable vaginal nodularity vs post op changes.  Completed 6 cycles of CarboTaxol chemotherapy and vaginal brachytherapy 1/24.  CT scan negative 10/23 and 06/05/2023.    Vaginal exam negative for evidence of disease.   Stable adrenal findings 06/05/2023 c/w benign adrenal lesion no follow up indicated   Desired sexual function.  Vaginal atrophy and some narrowing of the vaginal introitus present.  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 Followed by endocrinology.   Hypothyroidism and hypocalcemia.  Worsening neuropathy.  Medical co-morbidities complicating care: HTN and prior abdominal surgery. There is no height or weight on file to calculate BMI.   Plan:    Problem List Items Addressed This Visit    Visit Diagnoses Endometrial cancer         Encounter for follow-up surveillance of endometrial cancer    -  Primary    Relevant Orders    Ambulatory Referral to [facility]      Dyspareunia in female        Relevant Orders    Ambulatory Referral to [facility]      Mood disorder [facility])        Relevant Orders    Ambulatory Referral to [facility]           Clinically no evidence of disease today.  However new onset back pain is concerning.  Will order CT scan of the chest abdomen and pelvis to assess back pain and also follow-up on adrenal nodule.  We will continue to alternate visits with Dr Randy Buttery every 3 months. We reviewed nccn survivorship guidelines and topics today.    We again reviewed sexual dysfunction post treatment.  I recommended maximization of her vaginal moisturizer therapy.  I do not recommend systemic estrogen therapy.  She will follow-up with Kenney Peacemaker, NP in the sexual health clinic.   Neuropathy- present prior to chemotherapy but has worsened since chemotherapy.  She was interested in a re-trial lyrica  which she last took in 2021. Kenney Peacemaker sent in a prescription.  The other issues of hypothyroidism and hypocalcemia may be contributing to her neuropathy and I recommended that she follow-up with her endocrinologist and primary care physician for management discussion and treatment options.   She will follow-up with her other care providers regarding her other medical issues including screening mammograms.  She will follow up with us  in 3 months and Dr. Randy Buttery in 6 months. We can see her back sooner if she has concerning symptoms findings on CT scan.  The patient's diagnosis, an outline of the further diagnostic and laboratory studies which will be required, the recommendation for surgery, and alternatives were discussed with her and her accompanying family members.  All questions were answered to their satisfaction.   Koralynn Greenspan Iola Manila, MD

## 2024-04-03 ENCOUNTER — Other Ambulatory Visit: Payer: Self-pay

## 2024-04-04 ENCOUNTER — Other Ambulatory Visit: Payer: Self-pay

## 2024-04-15 ENCOUNTER — Ambulatory Visit

## 2024-05-01 ENCOUNTER — Other Ambulatory Visit: Payer: Self-pay

## 2024-05-01 ENCOUNTER — Inpatient Hospital Stay: Attending: Oncology | Admitting: Nurse Practitioner

## 2024-05-01 ENCOUNTER — Encounter: Payer: Self-pay | Admitting: Nurse Practitioner

## 2024-05-01 VITALS — BP 171/88 | HR 65 | Temp 98.0°F | Resp 18

## 2024-05-01 DIAGNOSIS — C541 Malignant neoplasm of endometrium: Secondary | ICD-10-CM | POA: Insufficient documentation

## 2024-05-01 DIAGNOSIS — Z8542 Personal history of malignant neoplasm of other parts of uterus: Secondary | ICD-10-CM

## 2024-05-01 DIAGNOSIS — Z08 Encounter for follow-up examination after completed treatment for malignant neoplasm: Secondary | ICD-10-CM

## 2024-05-01 NOTE — Progress Notes (Signed)
 Patient has not had imaging yet. Appt rescheduled.

## 2024-05-08 ENCOUNTER — Ambulatory Visit
Admission: RE | Admit: 2024-05-08 | Discharge: 2024-05-08 | Disposition: A | Discharge status: Home / Self Care | Source: Ambulatory Visit | Attending: Nurse Practitioner | Admitting: Nurse Practitioner

## 2024-05-08 DIAGNOSIS — K760 Fatty (change of) liver, not elsewhere classified: Secondary | ICD-10-CM | POA: Diagnosis not present

## 2024-05-08 DIAGNOSIS — Z08 Encounter for follow-up examination after completed treatment for malignant neoplasm: Secondary | ICD-10-CM | POA: Diagnosis not present

## 2024-05-08 DIAGNOSIS — Z8542 Personal history of malignant neoplasm of other parts of uterus: Secondary | ICD-10-CM | POA: Diagnosis not present

## 2024-05-08 DIAGNOSIS — D1803 Hemangioma of intra-abdominal structures: Secondary | ICD-10-CM | POA: Diagnosis not present

## 2024-05-08 DIAGNOSIS — C541 Malignant neoplasm of endometrium: Secondary | ICD-10-CM | POA: Diagnosis not present

## 2024-05-08 DIAGNOSIS — K8020 Calculus of gallbladder without cholecystitis without obstruction: Secondary | ICD-10-CM | POA: Diagnosis not present

## 2024-05-08 MED ORDER — IOHEXOL 300 MG/ML  SOLN
80.0000 mL | Freq: Once | INTRAMUSCULAR | Status: AC | PRN
  Administered 2024-05-08: 80 mL via INTRAVENOUS

## 2024-05-19 ENCOUNTER — Inpatient Hospital Stay (HOSPITAL_BASED_OUTPATIENT_CLINIC_OR_DEPARTMENT_OTHER): Admitting: Nurse Practitioner

## 2024-05-19 ENCOUNTER — Encounter: Payer: Self-pay | Admitting: Nurse Practitioner

## 2024-05-19 VITALS — BP 138/70 | HR 70 | Temp 97.2°F | Resp 16 | Wt 157.0 lb

## 2024-05-19 DIAGNOSIS — C541 Malignant neoplasm of endometrium: Secondary | ICD-10-CM | POA: Diagnosis not present

## 2024-05-19 DIAGNOSIS — Z08 Encounter for follow-up examination after completed treatment for malignant neoplasm: Secondary | ICD-10-CM | POA: Diagnosis not present

## 2024-05-19 DIAGNOSIS — M545 Low back pain, unspecified: Secondary | ICD-10-CM

## 2024-05-19 DIAGNOSIS — Z8542 Personal history of malignant neoplasm of other parts of uterus: Secondary | ICD-10-CM | POA: Diagnosis not present

## 2024-05-19 NOTE — Patient Instructions (Signed)
 Please contact Dr Maree for evaluation of your visual symptoms.

## 2024-05-19 NOTE — Progress Notes (Signed)
 Hematology/Oncology Consult Note [facility]  Telephone:([phone] [phone]  Patient Care Team: Watt Mirza, MD as PCP - General (Family Medicine) Cindie Jesusa HERO, RN as Registered Nurse Dannielle Arlean FALCON, RN (Inactive) as Registered Nurse Maurie Rayfield BIRCH, RN as Oncology Nurse Navigator Lenn Aran, MD as Consulting Physician (Radiation Oncology) Elby Webb Loges, MD as Referring Physician (Obstetrics) Melanee Annah BROCKS, MD as Consulting Physician (Oncology)   Name of the patient: Jaime Johnson  [id number]  [birth year]   Date of visit: 05/19/24  Diagnosis- 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 Burnard from GYN oncology at [facility].  She underwent robotic surgery on 03/30/2022.  Final pathology showed:   Final Pathologic Diagnosis  A. SENTINEL LYMPH NODE, RIGHT EXTERNAL ILIAC ARTERY, RESECTION: Two lymph nodes, negative for metastatic carcinoma (0/2).    B. SENTINEL LYMPH NODE, LEFT EXTERNAL ILIAC ARTERY, RESECTION: One lymph node, negative for metastatic carcinoma (0/1).    C. UTERUS, FALLOPIAN TUBE AND OVARIES, HYSTERECTOMY WITH BILATERAL SALPINGO-OOPHORECTOMY: Endometrioid adenocarcinoma, FIGO grade 2. Tumor measures 5 cm in greatest dimension.  Tumor invades 14 of 17 mm thick myometrium (greater than 50%).  Margins of resection are uninvolved by tumor.  Tumor involves lower uterine segment.  Cervix is uninvolved by tumor.  Benign right fallopian tube and ovary.  Left ovary with tumor located within lymphovascular spaces, no tissue invasion identified.  Left fallopian tube with tumor within lymphovascular spaces, no tissue invasion identified. Free floating tumor located  inside left fallopian tube.  pT1b pN0  She was recommended adjuvant chemotherapy with carbo Taxol  for 3 cycles by Dr. Burnard followed by vaginal brachytherapy.  Patient states that she does not wish to travel all the way to [facility] given her ongoing fatigue as well as discomfort from episiotomy and would prefer to get treatment locally.  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  She is not a diabetic.  She has baseline rheumatoid arthritis  Pathology was also second for a second opinion to [facility] as well.  Path report over there suggested that this was a T3a tumor FIGO stage IIIa disease.  Patient received 6 cycles of CarboTaxol chemotherapy as well as pelvic radiation  Patient was diagnosed with thyroid  Nodule which was biopsy- proven papillary thyroid  carcinoma with follicular variant.  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  She follows up with endocrinology for the same    Interval history- Jaime Johnson is a 63 y.o. female with above history of endometrial cancer who returns to clinic for routine follow up. Her husband has had cardiac surgery and continues to recover. She has been taking care of him. Back pain has improved. Complains of right eye visual changes with her peripheral vision and intermittent changes in strength of her right hand and leg. Dyspareunia improved and libido.   ECOG PS- 1 Pain scale- 0 Opioid associated constipation- no  Review of systems- Review of Systems  Constitutional:  Negative for chills, fever, malaise/fatigue and weight loss.  HENT:  Negative for hearing loss, nosebleeds, sore throat and tinnitus.   Eyes:  Negative for blurred vision, double vision, photophobia, pain, discharge and redness.  Per hpi  Respiratory:  Negative for cough, hemoptysis, shortness of breath and wheezing.   Cardiovascular:  Negative  for chest pain, palpitations and leg swelling.  Gastrointestinal:  Negative for abdominal pain, blood in stool, constipation, diarrhea, melena, nausea and vomiting.  Genitourinary:  Negative for dysuria and urgency.  Musculoskeletal:  Negative for back pain, falls, joint pain and myalgias.  Skin:  Negative for itching and rash.  Neurological:  Negative for dizziness, tingling, sensory change, seizures, loss of consciousness, weakness and headaches.  Endo/Heme/Allergies:  Negative for environmental allergies. Does not bruise/bleed easily.  Psychiatric/Behavioral:  Negative for depression. The patient is not nervous/anxious and does not have insomnia.     Allergies  Allergen Reactions   Azithromycin Other (See Comments)    Gets a bad yeast infection from this.   Wound Dressing Adhesive Rash    bandaid   Past Medical History:  Diagnosis Date   Allergic rhinitis due to pollen    Anemia    COVID-19 2022   Endometrial cancer (HCC) 2023   Hyperlipidemia LDL goal <100 02/16/2020   Hypertension 12/19/2016   Motion sickness    cars   Thyroid  cancer (HCC) 2024   Vertigo    Past Surgical History:  Procedure Laterality Date   ABDOMINAL HYSTERECTOMY     CESAREAN SECTION     COLONOSCOPY WITH PROPOFOL  N/A 11/12/2016   Procedure: COLONOSCOPY WITH PROPOFOL ;  Surgeon: Rogelia Copping, MD;  Location: [facility];  Service: Endoscopy;  Laterality: N/A;   COLONOSCOPY WITH PROPOFOL  N/A 03/29/2017   Procedure: COLONOSCOPY WITH PROPOFOL ;  Surgeon: Copping Rogelia, MD;  Location: [facility];  Service: Endoscopy;  Laterality: N/A;   COLONOSCOPY WITH PROPOFOL  N/A 03/14/2020   Procedure: COLONOSCOPY WITH PROPOFOL ;  Surgeon: Copping Rogelia, MD;  Location: [facility];  Service: Endoscopy;  Laterality: N/A;  priority 4   COLONOSCOPY WITH PROPOFOL  N/A 08/19/2023   Procedure: COLONOSCOPY WITH PROPOFOL ;  Surgeon: Copping Rogelia, MD;  Location: [facility];  Service: Endoscopy;   Laterality: N/A;   CONTINUOUS NERVE MONITORING N/A 04/30/2023   Procedure: CONTINUOUS NERVE MONITORING;  Surgeon: Herminio Miu, MD;  Location: [facility] OR[facility];  Service: ENT;  Laterality: N/A;   ESOPHAGOGASTRODUODENOSCOPY N/A 08/19/2023   Procedure: ESOPHAGOGASTRODUODENOSCOPY (EGD);  Surgeon: Copping Rogelia, MD;  Location: [facility];  Service: Endoscopy;  Laterality: N/A;   IR IMAGING GUIDED PORT INSERTION  06/01/2022   IR REMOVAL TUN ACCESS W/ PORT W/O FL MOD SED  01/17/2023   POLYPECTOMY  11/12/2016   Procedure: POLYPECTOMY;  Surgeon: Rogelia Copping, MD;  Location: [facility];  Service: Endoscopy;;   POLYPECTOMY  03/29/2017   Procedure: POLYPECTOMY;  Surgeon: Copping Rogelia, MD;  Location: [facility];  Service: Endoscopy;;   POLYPECTOMY  08/19/2023   Procedure: POLYPECTOMY;  Surgeon: Copping Rogelia, MD;  Location: [facility];  Service: Endoscopy;;   THYROIDECTOMY N/A 04/30/2023   Procedure: THYROIDECTOMY;  Surgeon: Herminio Miu, MD;  Location: [facility] OR[facility];  Service: ENT;  Laterality: N/A;   Social History   Socioeconomic History   Marital status: Married    Spouse name: Not on file   Number of children: Not on file   Years of education: Not on file   Highest education level: Associate degree: occupational, Scientist, product/process development, or vocational program  Occupational History   Not on file  Tobacco Use   Smoking status: Former    Current packs/day: 0.00    Types: Cigarettes    Quit date: 10/22/1978  Years since quitting: 45.6   Smokeless tobacco: Never  Vaping Use   Vaping status: Never Used  Substance and Sexual Activity   Alcohol use: No   Drug use: No   Sexual activity: Yes    Partners: Male  Other Topics Concern   Not on file  Social History Narrative   Not on file   Social Drivers of Health   Financial Resource Strain: Low Risk  (07/04/2023)   Overall Financial Resource Strain (CARDIA)    Difficulty of Paying Living Expenses: Not very hard  Food  Insecurity: No Food Insecurity (07/04/2023)   Hunger Vital Sign    Worried About Running Out of Food in the Last Year: Never true    Ran Out of Food in the Last Year: Never true  Transportation Needs: No Transportation Needs (07/04/2023)   PRAPARE - Administrator, Civil Service (Medical): No    Lack of Transportation (Non-Medical): No  Physical Activity: Insufficiently Active (07/04/2023)   Exercise Vital Sign    Days of Exercise per Week: 2 days    Minutes of Exercise per Session: 20 min  Stress: No Stress Concern Present (07/04/2023)   [employer] of Occupational Health - Occupational Stress Questionnaire    Feeling of Stress : Only a little  Social Connections: Socially Integrated (07/04/2023)   Social Connection and Isolation Panel    Frequency of Communication with Friends and Family: More than three times a week    Frequency of Social Gatherings with Friends and Family: More than three times a week    Attends Religious Services: More than 4 times per year    Active Member of [employer] or Organizations: Yes    Attends Banker Meetings: 1 to 4 times per year    Marital Status: Married  Catering manager Violence: Not At Risk (04/30/2023)   Humiliation, Afraid, Rape, and Kick questionnaire    Fear of Current or Ex-Partner: No    Emotionally Abused: No    Physically Abused: No    Sexually Abused: No   Family History  Problem Relation Age of Onset   Hepatitis C Mother 60       d/c at 59   Hypertension Father    Hepatitis C Brother    Breast cancer Neg Hx    Current Outpatient Medications:    buPROPion  (WELLBUTRIN  XL) 150 MG 24 hr tablet, Take 2 tablets (300 mg total) by mouth daily., Disp: 90 tablet, Rfl: 1   celecoxib (CELEBREX) 200 MG capsule, Take 200 mg by mouth daily., Disp: , Rfl:    diazepam  (VALIUM ) 5 MG tablet, APPLY FEW DROPS OF WATER  TO TABLET PRIOR TO INSERTING 1 TABLET VAGINALLY 30-45 MINUTES PRIOR TO DILATOR EXERCISE 3-4 TIMES A WEEK AS  DIRECTED, Disp: 30 tablet, Rfl: 0   levothyroxine (SYNTHROID) 100 MCG tablet, Take 100 mcg by mouth daily., Disp: , Rfl:    lidocaine  (XYLOCAINE ) 5 % ointment, Apply 1 Application topically as needed (for dilator therapy)., Disp: 30 g, Rfl: 0   lisinopril  (ZESTRIL ) 5 MG tablet, Take 5 mg by mouth daily., Disp: , Rfl:    Multiple Vitamins-Minerals (MULTIVITAMIN GUMMIES ADULTS PO), Take by mouth daily., Disp: , Rfl:    pregabalin  (LYRICA ) 50 MG capsule, Take 1 capsule (50 mg total) by mouth 2 (two) times daily. For neuropathic pain post chemotherapy, Disp: 60 capsule, Rfl: 0   traMADol  (ULTRAM ) 50 MG tablet, Take 1 tablet (50 mg total) by mouth every 8 (eight) hours  as needed for moderate pain (pain score 4-6)., Disp: 30 tablet, Rfl: 5  Physical exam:  Vitals:   05/19/24 1352  BP: 138/70  Pulse: 70  Resp: 16  Temp: (!) 97.2 F (36.2 C)  TempSrc: Tympanic  SpO2: 100%  Weight: 157 lb (71.2 kg)   Physical Exam Vitals reviewed.  Constitutional:      Appearance: She is not ill-appearing.  Neck:     Comments: Central scar of total thyroidectomy seen Cardiovascular:     Rate and Rhythm: Normal rate and regular rhythm.  Pulmonary:     Effort: Pulmonary effort is normal.  Abdominal:     General: There is no distension.     Palpations: Abdomen is soft.     Tenderness: There is no abdominal tenderness.  Musculoskeletal:        General: No deformity.  Skin:    General: Skin is warm.  Neurological:     Mental Status: She is alert and oriented to person, place, and time.  Psychiatric:        Mood and Affect: Mood normal.        Behavior: Behavior normal.       Assessment and plan- Patient is a 63 y.o. female who returns to clinic for follow up of:   Stage Ib endometrioid endometrial cancer pT1b N0 M0 - s/p 6 cycles of CarboTaxol chemotherapy followed by vaginal brachytherapy. NED since. CT 05/08/24 for workup of back pain did not show evidence of recurrent disease. Continue  surveillance.  Dyspareunia- on wellbutrin  for decreased libido along with topical vaginal moisturizers and dilator exercises. She previously took oxycodone  incidentally for back pain and it improved her dyspareunia symptoms. She has been receiving 4-5 tablets of oxycodone  per month with improvement in symptoms. Discussed that this is not considered typical treatment but she has had symptom control so ok to continue for now. Question if impacting psychological component or comorbidity of back pain impacting intercourse? Unclear. Systemic HRT not recommended by Dr. Elby.  History of Thyroid  cancer- history of papillary thyroid  carcinoma s/p total thyroidectomy 7.9/24 with Dr Herminio. 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 Thyroglobulin unreliable d/t presence of antibodies. Plan for neck ultrasound every 6 mo to screen for residual/recurrent disease. She is followed by Dr Standley with [facility].  Incidental findings- hepatic steatosis, cholelithiasis. Stable benign hepatic hemangioma and left adrenal adenoma. Follow up with pcp Back pain- improved. Hold off on additional workup.   Disposition:  As scheduled- la  Visit Diagnosis 1. Acute midline low back pain without sciatica   2. Encounter for follow-up surveillance of endometrial cancer    Tinnie Dawn, DNP, AGNP-C, [facility] at [facility] [phone] (clinic) 05/19/2024

## 2024-05-20 ENCOUNTER — Ambulatory Visit: Payer: Self-pay | Admitting: Obstetrics and Gynecology

## 2024-05-20 DIAGNOSIS — C73 Malignant neoplasm of thyroid gland: Secondary | ICD-10-CM | POA: Diagnosis not present

## 2024-05-27 DIAGNOSIS — E890 Postprocedural hypothyroidism: Secondary | ICD-10-CM | POA: Diagnosis not present

## 2024-05-29 ENCOUNTER — Ambulatory Visit

## 2024-05-29 DIAGNOSIS — R490 Dysphonia: Secondary | ICD-10-CM | POA: Diagnosis not present

## 2024-05-29 DIAGNOSIS — E890 Postprocedural hypothyroidism: Secondary | ICD-10-CM | POA: Diagnosis not present

## 2024-05-29 DIAGNOSIS — C73 Malignant neoplasm of thyroid gland: Secondary | ICD-10-CM | POA: Diagnosis not present

## 2024-06-02 ENCOUNTER — Telehealth: Payer: Self-pay

## 2024-06-02 ENCOUNTER — Ambulatory Visit

## 2024-06-02 VITALS — BP 150/85 | Ht 62.0 in | Wt 156.0 lb

## 2024-06-02 DIAGNOSIS — Z0000 Encounter for general adult medical examination without abnormal findings: Secondary | ICD-10-CM | POA: Diagnosis not present

## 2024-06-02 NOTE — Telephone Encounter (Signed)
 Copied from CRM [id number]. Topic: Appointments - Appointment Info/Confirmation >> Jun 02, 2024  3:24 PM Jaime Johnson wrote: Patient was calling because she did not get the call for her [facility] phone appt. Called the CAL to see if provider was running behind and she said she sent a message to her to see what was going on. Patient disconnected. Please call patient if we need to reschedule.

## 2024-06-02 NOTE — Progress Notes (Signed)
 Because this visit was a virtual/telehealth visit,  certain criteria was not obtained, such a blood pressure, CBG if applicable, and timed get up and go. Any medications not marked as taking were not mentioned during the medication reconciliation part of the visit. Any vitals not documented were not able to be obtained due to this being a telehealth visit or patient was unable to self-report a recent blood pressure reading due to a lack of equipment at home via telehealth. Vitals that have been documented are verbally provided by the patient.   This visit was performed by a medical professional under my direct supervision. I was immediately available for consultation/collaboration. I have reviewed and agree with the Annual Wellness Visit documentation.  Subjective:   Jaime Johnson is a 63 y.o. who presents for a Medicare Wellness preventive visit.  As a reminder, Annual Wellness Visits don't include a physical exam, and some assessments may be limited, especially if this visit is performed virtually. We may recommend an in-person follow-up visit with your provider if needed.  Visit Complete: Virtual I connected with  Jaime Johnson on 06/02/24 by a audio enabled telemedicine application and verified that I am speaking with the correct person using two identifiers.  Patient Location: Home  Provider Location: Home Office  I discussed the limitations of evaluation and management by telemedicine. The patient expressed understanding and agreed to proceed.  Vital Signs: Because this visit was a virtual/telehealth visit, some criteria may be missing or patient reported. Any vitals not documented were not able to be obtained and vitals that have been documented are patient reported.  VideoDeclined- This patient declined Librarian, academic. Therefore the visit was completed with audio only.  Persons Participating in Visit: Patient.  AWV Questionnaire: No: Patient  Medicare AWV questionnaire was not completed prior to this visit.  Cardiac Risk Factors include: advanced age (>35men, >72 women);hypertension;dyslipidemia     Objective:    Today's Vitals   06/02/24 1526  BP: (!) 150/85  Weight: 156 lb (70.8 kg)  Height: 5' 2 (1.575 m)  PainSc: 5    Body mass index is 28.53 kg/m.     06/02/2024    3:25 PM 05/19/2024    1:46 PM 03/06/2024    2:14 PM 09/06/2023   10:21 AM 09/03/2023    9:22 AM 08/19/2023    7:26 AM 07/09/2023    9:31 AM  Advanced Directives  Does Patient Have a Medical Advance Directive? No No No No Yes Yes Yes  Type of Advance Directive      Living will   Does patient want to make changes to medical advance directive?   No - Patient declined No - Patient declined  No - Patient declined   Would patient like information on creating a medical advance directive? No - Patient declined   No - Patient declined       Current Medications (verified) Outpatient Encounter Medications as of 06/02/2024  Medication Sig   buPROPion  (WELLBUTRIN  XL) 150 MG 24 hr tablet Take 2 tablets (300 mg total) by mouth daily.   celecoxib (CELEBREX) 200 MG capsule Take 200 mg by mouth daily.   diazepam  (VALIUM ) 5 MG tablet APPLY FEW DROPS OF WATER  TO TABLET PRIOR TO INSERTING 1 TABLET VAGINALLY 30-45 MINUTES PRIOR TO DILATOR EXERCISE 3-4 TIMES A WEEK AS DIRECTED   levothyroxine (SYNTHROID) 100 MCG tablet Take 100 mcg by mouth daily.   lidocaine  (XYLOCAINE ) 5 % ointment Apply 1 Application topically as needed (  for dilator therapy).   lisinopril  (ZESTRIL ) 5 MG tablet Take 5 mg by mouth daily.   Multiple Vitamins-Minerals (MULTIVITAMIN GUMMIES ADULTS PO) Take by mouth daily.   pregabalin  (LYRICA ) 50 MG capsule Take 1 capsule (50 mg total) by mouth 2 (two) times daily. For neuropathic pain post chemotherapy   traMADol  (ULTRAM ) 50 MG tablet Take 1 tablet (50 mg total) by mouth every 8 (eight) hours as needed for moderate pain (pain score 4-6).   No  facility-administered encounter medications on file as of 06/02/2024.    Allergies (verified) Azithromycin and Wound dressing adhesive   History: Past Medical History:  Diagnosis Date   Allergic rhinitis due to pollen    Anemia    COVID-19 2022   Endometrial cancer (HCC) 2023   Hyperlipidemia LDL goal <100 02/16/2020   Hypertension 12/19/2016   Motion sickness    cars   Thyroid  cancer (HCC) 2024   Vertigo    Past Surgical History:  Procedure Laterality Date   ABDOMINAL HYSTERECTOMY     CESAREAN SECTION     COLONOSCOPY WITH PROPOFOL  N/A 11/12/2016   Procedure: COLONOSCOPY WITH PROPOFOL ;  Surgeon: Rogelia Copping, MD;  Location: [facility];  Service: Endoscopy;  Laterality: N/A;   COLONOSCOPY WITH PROPOFOL  N/A 03/29/2017   Procedure: COLONOSCOPY WITH PROPOFOL ;  Surgeon: Copping Rogelia, MD;  Location: [facility];  Service: Endoscopy;  Laterality: N/A;   COLONOSCOPY WITH PROPOFOL  N/A 03/14/2020   Procedure: COLONOSCOPY WITH PROPOFOL ;  Surgeon: Copping Rogelia, MD;  Location: [facility];  Service: Endoscopy;  Laterality: N/A;  priority 4   COLONOSCOPY WITH PROPOFOL  N/A 08/19/2023   Procedure: COLONOSCOPY WITH PROPOFOL ;  Surgeon: Copping Rogelia, MD;  Location: [facility];  Service: Endoscopy;  Laterality: N/A;   CONTINUOUS NERVE MONITORING N/A 04/30/2023   Procedure: CONTINUOUS NERVE MONITORING;  Surgeon: Herminio Miu, MD;  Location: [facility];  Service: ENT;  Laterality: N/A;   ESOPHAGOGASTRODUODENOSCOPY N/A 08/19/2023   Procedure: ESOPHAGOGASTRODUODENOSCOPY (EGD);  Surgeon: Copping Rogelia, MD;  Location: [facility];  Service: Endoscopy;  Laterality: N/A;   IR IMAGING GUIDED PORT INSERTION  06/01/2022   IR REMOVAL TUN ACCESS W/ PORT W/O FL MOD SED  01/17/2023   POLYPECTOMY  11/12/2016   Procedure: POLYPECTOMY;  Surgeon: Rogelia Copping, MD;  Location: [facility];  Service: Endoscopy;;   POLYPECTOMY  03/29/2017   Procedure: POLYPECTOMY;   Surgeon: Copping Rogelia, MD;  Location: [facility];  Service: Endoscopy;;   POLYPECTOMY  08/19/2023   Procedure: POLYPECTOMY;  Surgeon: Copping Rogelia, MD;  Location: [facility];  Service: Endoscopy;;   THYROIDECTOMY N/A 04/30/2023   Procedure: THYROIDECTOMY;  Surgeon: Herminio Miu, MD;  Location: [facility];  Service: ENT;  Laterality: N/A;   Family History  Problem Relation Age of Onset   Hepatitis C Mother 70       d/c at 32   Hypertension Father    Hepatitis C Brother    Breast cancer Neg Hx    Social History   Socioeconomic History   Marital status: Married    Spouse name: Not on file   Number of children: Not on file   Years of education: Not on file   Highest education level: Associate degree: occupational, Scientist, product/process development, or vocational program  Occupational History   Not on file  Tobacco Use   Smoking status: Former    Current packs/day: 0.00    Types: Cigarettes    Quit date: 10/22/1978    Years since quitting:  45.6   Smokeless tobacco: Never  Vaping Use   Vaping status: Never Used  Substance and Sexual Activity   Alcohol use: No   Drug use: No   Sexual activity: Yes    Partners: Male  Other Topics Concern   Not on file  Social History Narrative   Not on file   Social Drivers of Health   Financial Resource Strain: Low Risk  (06/02/2024)   Overall Financial Resource Strain (CARDIA)    Difficulty of Paying Living Expenses: Not hard at all  Food Insecurity: No Food Insecurity (06/02/2024)   Hunger Vital Sign    Worried About Running Out of Food in the Last Year: Never true    Ran Out of Food in the Last Year: Never true  Transportation Needs: No Transportation Needs (06/02/2024)   PRAPARE - Administrator, Civil Service (Medical): No    Lack of Transportation (Non-Medical): No  Physical Activity: Insufficiently Active (06/02/2024)   Exercise Vital Sign    Days of Exercise per Week: 7 days    Minutes of Exercise per Session: 10 min   Stress: No Stress Concern Present (06/02/2024)   [employer] of Occupational Health - Occupational Stress Questionnaire    Feeling of Stress: Only a little  Social Connections: Socially Integrated (06/02/2024)   Social Connection and Isolation Panel    Frequency of Communication with Friends and Family: More than three times a week    Frequency of Social Gatherings with Friends and Family: More than three times a week    Attends Religious Services: More than 4 times per year    Active Member of [employer] or Organizations: Yes    Attends Banker Meetings: 1 to 4 times per year    Marital Status: Married    Tobacco Counseling Counseling given: Not Answered    Clinical Intake:  Pre-visit preparation completed: Yes  Pain : 0-10 Pain Score: 5  Pain Type: Neuropathic pain Pain Descriptors / Indicators: Constant Pain Onset: Today Pain Frequency: Constant     BMI - recorded: 28.53 Nutritional Status: BMI 25 -29 Overweight Nutritional Risks: None Diabetes: No  Lab Results  Component Value Date   HGBA1C 5.3 03/14/2021   HGBA1C 5.2 02/08/2020     How often do you need to have someone help you when you read instructions, pamphlets, or other written materials from your doctor or pharmacy?: 1 - Never What is the last grade level you completed in school?: associate degree  Interpreter Needed?: No  Information entered by :: Mumin Denomme,CMA   Activities of Daily Living     06/02/2024    3:30 PM 08/19/2023    7:27 AM  In your present state of health, do you have any difficulty performing the following activities:  Hearing? 0 0  Vision? 0 0  Difficulty concentrating or making decisions? 0 0  Walking or climbing stairs? 0   Dressing or bathing? 0   Doing errands, shopping? 0   Preparing Food and eating ? N   Using the Toilet? N   In the past six months, have you accidently leaked urine? Y   Do you have problems with loss of bowel control? Y   Managing  your Medications? N   Managing your Finances? N   Housekeeping or managing your Housekeeping? N     Patient Care Team: Watt Mirza, MD as PCP - General (Family Medicine) Cindie Jesusa HERO, RN as Registered Nurse Dannielle Arlean FALCON, RN (Inactive) as  Registered Nurse Maurie Rayfield BIRCH, RN as Oncology Nurse Navigator Lenn Aran, MD as Consulting Physician (Radiation Oncology) Elby Webb Loges, MD as Referring Physician (Obstetrics) Melanee Annah BROCKS, MD as Consulting Physician (Oncology)  I have updated your Care Teams any recent Medical Services you may have received from other providers in the past year.     Assessment:   This is a routine wellness examination for Jaime Johnson.  Hearing/Vision screen Hearing Screening - Comments:: No difficulties hearing  Vision Screening - Comments:: Patient wears glasses for reading    Goals Addressed             This Visit's Progress    Patient Stated       To lose a few lbs       Depression Screen     06/02/2024    3:33 PM 05/19/2024    1:50 PM 05/01/2024    9:38 AM 07/08/2023   11:23 AM 06/22/2022    9:54 AM 04/13/2020    1:55 PM 09/28/2019   11:35 AM  PHQ 2/9 Scores  PHQ - 2 Score 0 0 0 1 2 0 0  PHQ- 9 Score 2 0 0  3      Fall Risk     06/02/2024    3:29 PM 04/13/2020    1:55 PM 01/25/2020    8:47 AM 09/28/2019   11:34 AM  Fall Risk   Falls in the past year? 0 0 0  0   Number falls in past yr: 0     Injury with Fall? 0     Risk for fall due to : No Fall Risks     Follow up Falls evaluation completed  Falls evaluation completed       Data saved with a previous flowsheet row definition    MEDICARE RISK AT HOME:  Medicare Risk at Home Any stairs in or around the home?: Yes If so, are there any without handrails?: No Home free of loose throw rugs in walkways, pet beds, electrical cords, etc?: Yes Adequate lighting in your home to reduce risk of falls?: Yes Life alert?: No Use of a cane, walker or w/c?: No Grab bars in  the bathroom?: Yes Shower chair or bench in shower?: Yes Elevated toilet seat or a handicapped toilet?: Yes  TIMED UP AND GO:  Was the test performed?  No  Cognitive Function: 6CIT completed        06/02/2024    3:34 PM  6CIT Screen  What Year? 0 points  What month? 0 points  What time? 0 points  Count back from 20 0 points  Months in reverse 0 points  Repeat phrase 0 points  Total Score 0 points    Immunizations Immunization History  Administered Date(s) Administered   Influenza Inj Mdck Quad Pf 09/24/2019   Influenza, Seasonal, Injecte, Preservative Fre 07/24/2016, 07/08/2023   Moderna Sars-Covid-2 Vaccination 12/05/2019, 12/26/2019   Pneumococcal Polysaccharide-23 09/21/2018   Tdap 07/02/2013   Zoster Recombinant(Shingrix) 06/23/2018, 09/15/2018    Screening Tests Health Maintenance  Topic Date Due   Pneumococcal Vaccine: 19-49 Years (2 of 2 - PCV) 09/22/2019   Pneumococcal Vaccine: 50+ Years (2 of 2 - PCV) 09/22/2019   COVID-19 Vaccine (3 - Moderna risk series) 01/23/2020   MAMMOGRAM  06/06/2023   DTaP/Tdap/Td (2 - Td or Tdap) 07/03/2023   INFLUENZA VACCINE  05/22/2024   Medicare Annual Wellness (AWV)  06/02/2025   Colonoscopy  08/18/2030   Hepatitis C Screening  Completed  HIV Screening  Completed   Zoster Vaccines- Shingrix  Completed   Hepatitis B Vaccines  Aged Out   HPV VACCINES  Aged Out   Meningococcal B Vaccine  Aged Out    Health Maintenance  Health Maintenance Due  Topic Date Due   Pneumococcal Vaccine: 19-49 Years (2 of 2 - PCV) 09/22/2019   Pneumococcal Vaccine: 50+ Years (2 of 2 - PCV) 09/22/2019   COVID-19 Vaccine (3 - Moderna risk series) 01/23/2020   MAMMOGRAM  06/06/2023   DTaP/Tdap/Td (2 - Td or Tdap) 07/03/2023   INFLUENZA VACCINE  05/22/2024   Health Maintenance Items Addressed:patient declined vaccination  Additional Screening:  Vision Screening: Recommended annual ophthalmology exams for early detection of glaucoma and  other disorders of the eye. Would you like a referral to an eye doctor? No    Dental Screening: Recommended annual dental exams for proper oral hygiene  Community Resource Referral / Chronic Care Management: CRR required this visit?  No   CCM required this visit?  No   Plan:    I have personally reviewed and noted the following in the patient's chart:   Medical and social history Use of alcohol, tobacco or illicit drugs  Current medications and supplements including opioid prescriptions. Patient is not currently taking opioid prescriptions. Functional ability and status Nutritional status Physical activity Advanced directives List of other physicians Hospitalizations, surgeries, and ER visits in previous 12 months Vitals Screenings to include cognitive, depression, and falls Referrals and appointments  In addition, I have reviewed and discussed with patient certain preventive protocols, quality metrics, and best practice recommendations. A written personalized care plan for preventive services as well as general preventive health recommendations were provided to patient.   Jaime Johnson, [place]   06/02/2024   After Visit Summary: (MyChart) Due to this being a telephonic visit, the after visit summary with patients personalized plan was offered to patient via MyChart   Notes: Nothing significant to report at this time.

## 2024-06-02 NOTE — Patient Instructions (Signed)
 Ms. Jaime Johnson , Thank you for taking time out of your busy schedule to complete your Annual Wellness Visit with me. I enjoyed our conversation and look forward to speaking with you again next year. I, as well as your care team,  appreciate your ongoing commitment to your health goals. Please review the following plan we discussed and let me know if I can assist you in the future. Your Game plan/ To Do List    Referrals: If you haven't heard from the office you've been referred to, please reach out to them at the phone provided.   Follow up Visits: We will see or speak with you next year for your Next Medicare AWV with our clinical staff Have you seen your provider in the last 6 months (3 months if uncontrolled diabetes)? Yes  Clinician Recommendations:  Aim for 30 minutes of exercise or brisk walking, 6-8 glasses of water , and 5 servings of fruits and vegetables each day.       This is a list of the screenings recommended for you:  Health Maintenance  Topic Date Due   Pneumococcal Vaccine for high risk medical condition (2 of 2 - PCV) 09/22/2019   Pneumococcal Vaccine for age over 60 (2 of 2 - PCV) 09/22/2019   COVID-19 Vaccine (3 - Moderna risk series) 01/23/2020   Mammogram  06/06/2023   DTaP/Tdap/Td vaccine (2 - Td or Tdap) 07/03/2023   Flu Shot  05/22/2024   Medicare Annual Wellness Visit  06/02/2025   Colon Cancer Screening  08/18/2030   Hepatitis C Screening  Completed   HIV Screening  Completed   Zoster (Shingles) Vaccine  Completed   Hepatitis B Vaccine  Aged Out   HPV Vaccine  Aged Out   Meningitis B Vaccine  Aged Out    Advanced directives: (Declined) Advance directive discussed with you today. Even though you declined this today, please call our office should you change your mind, and we can give you the proper paperwork for you to fill out. Advance Care Planning is important because it:  [x]  Makes sure you receive the medical care that is consistent with your values,  goals, and preferences  [x]  It provides guidance to your family and loved ones and reduces their decisional burden about whether or not they are making the right decisions based on your wishes.  Follow the link provided in your after visit summary or read over the paperwork we have mailed to you to help you started getting your Advance Directives in place. If you need assistance in completing these, please reach out to us  so that we can help you!  See attachments for Preventive Care and Fall Prevention Tips.

## 2024-06-03 ENCOUNTER — Other Ambulatory Visit: Payer: Self-pay

## 2024-06-10 ENCOUNTER — Ambulatory Visit: Payer: PPO

## 2024-07-01 ENCOUNTER — Telehealth: Payer: Self-pay | Admitting: Family Medicine

## 2024-07-01 ENCOUNTER — Inpatient Hospital Stay: Attending: Oncology | Admitting: Nurse Practitioner

## 2024-07-01 VITALS — BP 146/92 | HR 68 | Temp 98.6°F | Resp 20 | Wt 157.3 lb

## 2024-07-01 DIAGNOSIS — Z131 Encounter for screening for diabetes mellitus: Secondary | ICD-10-CM

## 2024-07-01 DIAGNOSIS — G629 Polyneuropathy, unspecified: Secondary | ICD-10-CM

## 2024-07-01 DIAGNOSIS — Z08 Encounter for follow-up examination after completed treatment for malignant neoplasm: Secondary | ICD-10-CM | POA: Diagnosis not present

## 2024-07-01 DIAGNOSIS — G609 Hereditary and idiopathic neuropathy, unspecified: Secondary | ICD-10-CM

## 2024-07-01 DIAGNOSIS — Z8542 Personal history of malignant neoplasm of other parts of uterus: Secondary | ICD-10-CM

## 2024-07-01 DIAGNOSIS — Z90710 Acquired absence of both cervix and uterus: Secondary | ICD-10-CM | POA: Diagnosis not present

## 2024-07-01 DIAGNOSIS — Z90722 Acquired absence of ovaries, bilateral: Secondary | ICD-10-CM | POA: Diagnosis not present

## 2024-07-01 DIAGNOSIS — E782 Mixed hyperlipidemia: Secondary | ICD-10-CM

## 2024-07-01 DIAGNOSIS — Z9221 Personal history of antineoplastic chemotherapy: Secondary | ICD-10-CM | POA: Diagnosis not present

## 2024-07-01 DIAGNOSIS — Z923 Personal history of irradiation: Secondary | ICD-10-CM | POA: Diagnosis not present

## 2024-07-01 DIAGNOSIS — E559 Vitamin D deficiency, unspecified: Secondary | ICD-10-CM

## 2024-07-01 DIAGNOSIS — Z9079 Acquired absence of other genital organ(s): Secondary | ICD-10-CM | POA: Diagnosis not present

## 2024-07-01 DIAGNOSIS — Z79899 Other long term (current) drug therapy: Secondary | ICD-10-CM

## 2024-07-01 NOTE — Addendum Note (Signed)
 Addended by: WATT MIRZA on: 07/01/2024 08:06 PM   Modules accepted: Orders

## 2024-07-01 NOTE — Telephone Encounter (Signed)
 Jaime Johnson

## 2024-07-01 NOTE — Telephone Encounter (Signed)
 Future orders placed

## 2024-07-01 NOTE — Telephone Encounter (Signed)
 I called and left a voicemail for patient to be scheduled for labs, I will follow up with her again.

## 2024-07-01 NOTE — Telephone Encounter (Signed)
 Patient has a lab appointment tomorrow 07/02/24 lab orders are needed

## 2024-07-01 NOTE — Telephone Encounter (Signed)
 Copied from CRM #[id number]. Topic: Clinical - Request for Lab/Test Order >> Jun 30, 2024  3:48 PM Robinson H wrote: Reason for CRM: Patient would have to have labs done prior her physical appointment on 9/24, patient wants to have them done at the [place] location instead of [facility].  Rhenda [phone]

## 2024-07-01 NOTE — Progress Notes (Signed)
 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 G68P1 female who is seen in consultation from Dr. Melanee for endometrial cancer s/p RA-TLH-BSO with SLND and perineal repair at [facility] on 03/31/22 who presents for surveillance.  She was see by Dr Elby June 2025 and complained of back pain at that time. CT scan was obtained 05/08/24 which was negative for recurrent or metastatic disease within the chest, abdomen, pelvis. Stable benign hepatic hemangioma and left adrenal adenoma. Cholelithiasis. No radiographic evidence of cholecystitis. Hepatic steatosis.  Today, she feels that back pain is stable. She continues to use vaginal moisturizers and dilators. She is sexually active and continues to be followed by sexual health clinic. She complains of ongoing fatigue and generalized weakness. Doesn't feel as strong as she did prior to undergoing treatment. She has not been able to exercise consistently due to cramping. She has chronic peripheral neuropathy. Denies bleeding, spotting, discharge.     Gyn Onc History: Patient is a pleasant female who is seen in consultation from Dr. Melanee for endometrial cancer s/p RA-TLH-BSO with SLND and perineal repair at [facility] on 03/31/22.   She presented with PMB to gyn. Biopsy showed endometrial cancer and was referred to Dr. Burnard from [facility]. She underwent RA-TLH-BSo on 03/30/2022.   Final Pathologic Diagnosis  A. SENTINEL LYMPH NODE, RIGHT EXTERNAL ILIAC ARTERY, RESECTION: Two lymph nodes, negative for metastatic carcinoma (0/2).    B. SENTINEL LYMPH NODE, LEFT EXTERNAL ILIAC ARTERY, RESECTION: One lymph node, negative for metastatic carcinoma (0/1).    C. UTERUS, FALLOPIAN TUBE AND OVARIES, HYSTERECTOMY WITH  BILATERAL SALPINGO-OOPHORECTOMY: Endometrioid adenocarcinoma, FIGO grade 2. Tumor measures 5 cm in greatest dimension.  Tumor invades 14 of 17 mm thick myometrium (greater than 50%).  Margins of resection are uninvolved by tumor.  Tumor involves lower uterine segment.  Cervix is uninvolved by tumor.  Benign right fallopian tube and ovary.  Left ovary with tumor located within lymphovascular spaces, no tissue invasion identified.  Left fallopian tube with tumor within lymphovascular spaces, no tissue invasion identified. Free floating tumor located inside left fallopian tube.  pT1b pN0   Surgery was complicated by vaginal laceration/episiotomy. During the repair, the tip of the needle broke off. Xray was performed and confirmed 'curvilinear suture needle projects over the left labial tissue/left perineum'. Needle was located using fluoroscopy and was removed in its entirety and incision was then repaired. Surgery was prolonged due to this with estimated time of ~ 7 hours.    Post op course was then complicated by fever requiring hospitalization for another week.    Dr. Burnard saw patient, recommended adjuvant chemotherapy with Carbo Taxol  for 3 cycles followed by vaginal brachytherapy.   Patient transferred care to [facility] and has been followed by Dr. Melanee with medical oncology. 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 She is not a diabetic. She has baseline rheumatoid arthritis.   Pathology was also second for a second opinion to [facility] as well. Path report suggested that this was a T3a tumor FIGO stage IIIa disease. Left ovary and fallopian tube: Positive for carcinoma, lymphovascular involvement. Microcystic elongated fragmented (MELF) pattern of invasion and associated lymphovascular invasion.     1. Right external iliac artery sentinel lymph node, lymphadenectomy: - Two lymph  nodes, negative for malignancy (0/2).   2.  Left external iliac artery, sentinel lymph node,  lymphadenectomy: - One lymph node, suspicious for isolated tumor cells, see comment.   Comment: The paraffin block was requested for keratin immunohistochemistry, but the referring institution declined the request.  Since we were unable to obtain material for immunohistochemistry, this node will be classified as negative for synoptic reporting purposes and staging.    MLH1: Retained expression (wild-type). PMS2: Retained expression (wild-type). MSH2: Retained expression (wild-type). MSH6: Retained expression (wild-type).  P53 immunohistochemistry:   Wild type pattern (stain performed at [facility] on outside block C5).    PET 06/05/22 IMPRESSION: 1. Status post hysterectomy without suspicious hypermetabolic soft tissue nodularity along the vaginal cuff to suggest residual disease. 2. No convincing evidence of hypermetabolic metastatic disease. 3. Mild mesenteric and omental stranding without abnormal FDG avid nodularity possibly reflecting postsurgical change, suggest attention on follow-up imaging. 4. Incidental 1.4 cm right thyroid  nodule with slightly increased uptake. Recommend nonemergent thyroid  US  and biopsy of nodule if detected. Reference: J Am Coll Radiol. 2015 Feb;12(2): 143-50 5. Slightly increased background thyroid  activity is nonspecific but may reflect thyroiditis recommend correlation with laboratory values. 6.  Aortic Atherosclerosis (ICD10-I70.0).   Received 3 cycles of CarboTaxol chemotherapy by Dr. Melanee. She required G-CSF for leukopenia.   10/23 CT scan showed no evidence of disease.   [facility] Tumor Board recommended completion of 6 cycles of carboplatin /paclitaxel  followed by repeat PET versus CT scan to assess status of vaginal nodularity. If nodularity present and concerning for persistent disease (FDG avid or biopsy-proven) could either treat with EBRT + VBT or surgical resection followed by VBT. Imaging findings could be postoperative in nature given proximity to surgery.     10/08/22 6 cycles of carbo-taxol  completed   10/29/22  vaginal brachytherapy completed    01/07/23 CT C/A/P IMPRESSION: - No developing new mass lesion, fluid collection or lymph node enlargement. The areas of stranding in the mesentery are less visible today. No new areas of nodularity or ascites.  - Fatty liver infiltration.  Gallstones.  Hepatic hemangioma. - Stable left adrenal nodule when adjusted for variances in technique.   06/05/2023 CT CHEST, ABDOMEN, AND PELVIS WITH CONTRAST  IMPRESSION: 1. No evidence of metastatic disease in the chest, abdomen or pelvis. 2. Questionable asymmetric wall thickening versus underdistention of the rectum, consider correlation with direct inspection. 3. Mild symmetric distal esophageal wall thickening, correlate for esophagitis. 4. Diffuse hepatic steatosis. 5.  Aortic Atherosclerosis   She has had issues with sexual function and uses vaginal dilator therapy, vaginal moisturizers and has used vaginal estrogen.    Allergies  Allergen Reactions   Azithromycin Other (See Comments)    Gets a bad yeast infection from this.   Wound Dressing Adhesive Rash    bandaid   Past Medical History:  Diagnosis Date   Allergic rhinitis due to pollen    Anemia    COVID-19 2022   Endometrial cancer (HCC) 2023   Hyperlipidemia LDL goal <100 02/16/2020   Hypertension 12/19/2016   Motion sickness    cars   Thyroid  cancer (HCC) 2024   Vertigo    Past Surgical History:  Procedure Laterality Date   ABDOMINAL HYSTERECTOMY     CESAREAN SECTION     COLONOSCOPY WITH PROPOFOL  N/A 11/12/2016   Procedure: COLONOSCOPY WITH PROPOFOL ;  Surgeon: Rogelia Copping, MD;  Location: [facility];  Service: Endoscopy;  Laterality: N/A;   COLONOSCOPY WITH PROPOFOL  N/A 03/29/2017   Procedure:  COLONOSCOPY WITH PROPOFOL ;  Surgeon: Jinny Carmine, MD;  Location: [facility];  Service: Endoscopy;  Laterality: N/A;   COLONOSCOPY WITH PROPOFOL  N/A 03/14/2020    Procedure: COLONOSCOPY WITH PROPOFOL ;  Surgeon: Jinny Carmine, MD;  Location: [facility];  Service: Endoscopy;  Laterality: N/A;  priority 4   COLONOSCOPY WITH PROPOFOL  N/A 08/19/2023   Procedure: COLONOSCOPY WITH PROPOFOL ;  Surgeon: Jinny Carmine, MD;  Location: [facility];  Service: Endoscopy;  Laterality: N/A;   CONTINUOUS NERVE MONITORING N/A 04/30/2023   Procedure: CONTINUOUS NERVE MONITORING;  Surgeon: Herminio Miu, MD;  Location: [facility];  Service: ENT;  Laterality: N/A;   ESOPHAGOGASTRODUODENOSCOPY N/A 08/19/2023   Procedure: ESOPHAGOGASTRODUODENOSCOPY (EGD);  Surgeon: Jinny Carmine, MD;  Location: [facility];  Service: Endoscopy;  Laterality: N/A;   IR IMAGING GUIDED PORT INSERTION  06/01/2022   IR REMOVAL TUN ACCESS W/ PORT W/O FL MOD SED  01/17/2023   POLYPECTOMY  11/12/2016   Procedure: POLYPECTOMY;  Surgeon: Carmine Jinny, MD;  Location: [facility];  Service: Endoscopy;;   POLYPECTOMY  03/29/2017   Procedure: POLYPECTOMY;  Surgeon: Jinny Carmine, MD;  Location: [facility];  Service: Endoscopy;;   POLYPECTOMY  08/19/2023   Procedure: POLYPECTOMY;  Surgeon: Jinny Carmine, MD;  Location: [facility];  Service: Endoscopy;;   THYROIDECTOMY N/A 04/30/2023   Procedure: THYROIDECTOMY;  Surgeon: Herminio Miu, MD;  Location: [facility] OR[facility];  Service: ENT;  Laterality: N/A;   Social History   Socioeconomic History   Marital status: Married    Spouse name: Not on file   Number of children: Not on file   Years of education: Not on file   Highest education level: Associate degree: occupational, scientist, product/process development, or vocational program  Occupational History   Not on file  Tobacco Use   Smoking status: Former    Current packs/day: 0.00    Types: Cigarettes    Quit date: 10/22/1978    Years since quitting: 45.7   Smokeless tobacco: Never  Vaping Use   Vaping status: Never Used  Substance and Sexual Activity   Alcohol use: No   Drug use: No    Sexual activity: Yes    Partners: Male  Other Topics Concern   Not on file  Social History Narrative   Not on file   Social Drivers of Health   Financial Resource Strain: Low Risk  (06/02/2024)   Overall Financial Resource Strain (CARDIA)    Difficulty of Paying Living Expenses: Not hard at all  Food Insecurity: No Food Insecurity (06/02/2024)   Hunger Vital Sign    Worried About Running Out of Food in the Last Year: Never true    Ran Out of Food in the Last Year: Never true  Transportation Needs: No Transportation Needs (06/02/2024)   PRAPARE - Administrator, Civil Service (Medical): No    Lack of Transportation (Non-Medical): No  Physical Activity: Insufficiently Active (06/02/2024)   Exercise Vital Sign    Days of Exercise per Week: 7 days    Minutes of Exercise per Session: 10 min  Stress: No Stress Concern Present (06/02/2024)   [employer] of Occupational Health - Occupational Stress Questionnaire    Feeling of Stress: Only a little  Social Connections: Socially Integrated (06/02/2024)   Social Connection and Isolation Panel    Frequency of Communication with Friends and Family: More than three times a week    Frequency of Social Gatherings with Friends and Family: More than three times a  week    Attends Religious Services: More than 4 times per year    Active Member of Clubs or Organizations: Yes    Attends Banker Meetings: 1 to 4 times per year    Marital Status: Married  Catering Manager Violence: Not At Risk (06/02/2024)   Humiliation, Afraid, Rape, and Kick questionnaire    Fear of Current or Ex-Partner: No    Emotionally Abused: No    Physically Abused: No    Sexually Abused: No   Family History  Problem Relation Age of Onset   Hepatitis C Mother 60       d/c at 20   Hypertension Father    Hepatitis C Brother    Breast cancer Neg Hx    Current Outpatient Medications:    celecoxib (CELEBREX) 200 MG capsule, Take 200 mg by mouth  daily., Disp: , Rfl:    diazepam  (VALIUM ) 5 MG tablet, APPLY FEW DROPS OF WATER  TO TABLET PRIOR TO INSERTING 1 TABLET VAGINALLY 30-45 MINUTES PRIOR TO DILATOR EXERCISE 3-4 TIMES A WEEK AS DIRECTED, Disp: 30 tablet, Rfl: 0   levothyroxine (SYNTHROID) 100 MCG tablet, Take 100 mcg by mouth daily., Disp: , Rfl:    lidocaine  (XYLOCAINE ) 5 % ointment, Apply 1 Application topically as needed (for dilator therapy)., Disp: 30 g, Rfl: 0   lisinopril  (ZESTRIL ) 5 MG tablet, Take 5 mg by mouth daily., Disp: , Rfl:    Multiple Vitamins-Minerals (MULTIVITAMIN GUMMIES ADULTS PO), Take by mouth daily., Disp: , Rfl:    pregabalin  (LYRICA ) 50 MG capsule, Take 1 capsule (50 mg total) by mouth 2 (two) times daily. For neuropathic pain post chemotherapy, Disp: 60 capsule, Rfl: 0   traMADol  (ULTRAM ) 50 MG tablet, Take 1 tablet (50 mg total) by mouth every 8 (eight) hours as needed for moderate pain (pain score 4-6)., Disp: 30 tablet, Rfl: 5   buPROPion  (WELLBUTRIN  XL) 150 MG 24 hr tablet, Take 2 tablets (300 mg total) by mouth daily. (Patient not taking: Reported on 07/01/2024), Disp: 90 tablet, Rfl: 1  Review of Systems General:  fatigue, generalized weakness Skin: no complaints Eyes: no complaints HEENT: no complaints Breasts: no complaints Pulmonary: no complaints Cardiac: no complaints Gastrointestinal: no complaints Genitourinary/Sexual: per hpi Ob/Gyn: no complaints Musculoskeletal: no complaints Hematology: no complaints Neurologic/Psych: neuropathy chronic   Objective:  Physical exam BP (!) 146/92   Pulse 68   Temp 98.6 F (37 C)   Resp 20   Wt 157 lb 4.8 oz (71.4 kg)   LMP 02/29/2020 (Approximate) Comment: Has had random spotting; hadn't had a period 1.5 yrs prior  SpO2 100%   BMI 28.77 kg/m   PERFORMANCE STATUS: 1  GENERAL: Patient is a well appearing female in no acute distress HEENT:  Sclera clear. Anicteric NODES:  Negative axillary, supraclavicular, inguinal lymph node  survery LUNGS:  Clear to auscultation bilaterally.   HEART:  Regular rate and rhythm.  ABDOMEN:  Soft, nontender.  No hernias, incisions well healed. No masses or ascites EXTREMITIES:  No peripheral edema. Atraumatic. No cyanosis SKIN:  Clear with no obvious rashes or skin changes.  NEURO:  Nonfocal. Well oriented.  Appropriate affect.  Pelvic: Chaperoned by CMA EGBUS: no lesions Cervix: surgically absent.  Vagina: no lesions, no discharge or bleeding. Atrophic. Scarring at upper vaginal apex. No nodularity appreciated.  Uterus: Absent BME: no palpable masses  Lab Review N/A   Radiologic Imaging: As per HPI/interval history     Assessment:  Jaime Johnson is  a 63 y.o. female diagnosed with Stage IIIA (pMMR; p53wt), grade 2 endometrioid endometrial cancer.  Underwent robotic TLH/BSO SLN mapping and biopsies 03/30/22 at [facility].  Tumor measures 5 cm in greatest dimension and invades 14 of 17 mm thick myometrium (greater than 50%), cervix is uninvolved by tumor, Microcystic elongated fragmented (MELF) pattern of invasion and associated lymphovascular invasion.  Left ovary with tumor located within lymphovascular spaces, no tissue invasion identified. Left fallopian tube with tumor within lymphovascular spaces, no tissue invasion identified. Free floating tumor located inside left fallopian tube. Left external iliac artery SLN suspicious for isolated tumor cells.     PET scan 8/23 with some questionable vaginal nodularity vs post op changes.  Completed 6 cycles of CarboTaxol chemotherapy and vaginal brachytherapy 1/24.  CT scan negative 10/23, 06/05/2023, and 05/08/24.    Vaginal exam negative for evidence of disease.   Stable adrenal findings 06/05/2023 c/w benign adrenal lesion no follow up indicated   Desired sexual function.  Vaginal atrophy and some narrowing of the vaginal introitus present.  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 Followed by endocrinology.   Hypothyroidism and hypocalcemia.  Worsening neuropathy.  Medical co-morbidities complicating care: HTN and prior abdominal surgery. There is no height or weight on file to calculate BMI.   Plan:    Problem List Items Addressed This Visit    Clinically, no evidence of disease today. CT from July 2025 performed for back pain and previously noted adrenal nodule was negative for recurrent disease. Back pain has now resolved.   She will continue to use vaginal moisturizers and lubricants. Systemic estrogen therapy was not recommended by Dr. Elby. She feels that symptoms are stable. She will continue dilator exercises. Continue vaginal valium  prior to. SHe has been using 4-5 tablets of oxycodone  per month with improvement in symptoms. This is prescribed by Dr Melanee.   Neuropathy- present prior to chemotherapy but worsened post chemo.  We retried Lyrica  at her last visit.  She feels that symptoms are improved.  We will continue to alternate visits with Dr Melanee and plan to see her back in 3 months for pelvic exam. Consider extending to every 6 months at that time.     She will follow-up with her other care providers regarding her other medical issues including screening mammograms.  The patient's diagnosis, an outline of the further diagnostic and laboratory studies which will be required, the recommendation for surgery, and alternatives were discussed with her and her accompanying family members.  All questions were answered to their satisfaction.   Tinnie KANDICE Dawn, NP

## 2024-07-02 ENCOUNTER — Other Ambulatory Visit (INDEPENDENT_AMBULATORY_CARE_PROVIDER_SITE_OTHER)

## 2024-07-02 DIAGNOSIS — G629 Polyneuropathy, unspecified: Secondary | ICD-10-CM

## 2024-07-02 DIAGNOSIS — Z131 Encounter for screening for diabetes mellitus: Secondary | ICD-10-CM

## 2024-07-02 DIAGNOSIS — E782 Mixed hyperlipidemia: Secondary | ICD-10-CM | POA: Diagnosis not present

## 2024-07-02 DIAGNOSIS — Z79899 Other long term (current) drug therapy: Secondary | ICD-10-CM

## 2024-07-02 DIAGNOSIS — E559 Vitamin D deficiency, unspecified: Secondary | ICD-10-CM

## 2024-07-02 LAB — BASIC METABOLIC PANEL WITH GFR
BUN: 15 mg/dL (ref 6–23)
CO2: 33 meq/L — ABNORMAL HIGH (ref 19–32)
Calcium: 9.5 mg/dL (ref 8.4–10.5)
Chloride: 101 meq/L (ref 96–112)
Creatinine, Ser: 0.92 mg/dL (ref 0.40–1.20)
GFR: 65.93 mL/min (ref >60.00)
Glucose, Bld: 110 mg/dL — ABNORMAL HIGH (ref 70–99)
Potassium: 4.2 meq/L (ref 3.5–5.1)
Sodium: 145 meq/L (ref 135–145)

## 2024-07-02 LAB — LIPID PANEL
Cholesterol: 278 mg/dL — ABNORMAL HIGH (ref 0–200)
HDL: 63.5 mg/dL (ref >39.00)
LDL Cholesterol: 188 mg/dL — ABNORMAL HIGH (ref 0–99)
NonHDL: 214.59
Total CHOL/HDL Ratio: 4
Triglycerides: 135 mg/dL (ref 0.0–149.0)
VLDL: 27 mg/dL (ref 0.0–40.0)

## 2024-07-02 LAB — HEPATIC FUNCTION PANEL
ALT: 53 U/L — ABNORMAL HIGH (ref 0–35)
AST: 32 U/L (ref 0–37)
Albumin: 4.9 g/dL (ref 3.5–5.2)
Alkaline Phosphatase: 50 U/L (ref 39–117)
Bilirubin, Direct: 0.2 mg/dL (ref 0.0–0.3)
Total Bilirubin: 0.8 mg/dL (ref 0.2–1.2)
Total Protein: 7.6 g/dL (ref 6.0–8.3)

## 2024-07-02 LAB — CBC WITH DIFFERENTIAL/PLATELET
Basophils Absolute: 0 K/uL (ref 0.0–0.1)
Basophils Relative: 1.2 % (ref 0.0–3.0)
Eosinophils Absolute: 0 K/uL (ref 0.0–0.7)
Eosinophils Relative: 1.2 % (ref 0.0–5.0)
HCT: 43.1 % (ref 36.0–46.0)
Hemoglobin: 14.6 g/dL (ref 12.0–15.0)
Lymphocytes Relative: 36 % (ref 12.0–46.0)
Lymphs Abs: 1.4 K/uL (ref 0.7–4.0)
MCHC: 33.9 g/dL (ref 30.0–36.0)
MCV: 83.4 fl (ref 78.0–100.0)
Monocytes Absolute: 0.3 K/uL (ref 0.1–1.0)
Monocytes Relative: 7.1 % (ref 3.0–12.0)
Neutro Abs: 2.1 K/uL (ref 1.4–7.7)
Neutrophils Relative %: 54.5 % (ref 43.0–77.0)
Platelets: 207 K/uL (ref 150.0–400.0)
RBC: 5.17 Mil/uL — ABNORMAL HIGH (ref 3.87–5.11)
RDW: 13.5 % (ref 11.5–15.5)
WBC: 3.9 K/uL — ABNORMAL LOW (ref 4.0–10.5)

## 2024-07-02 LAB — VITAMIN D 25 HYDROXY (VIT D DEFICIENCY, FRACTURES): VITD: 51.94 ng/mL (ref 30.00–100.00)

## 2024-07-02 LAB — HEMOGLOBIN A1C: Hgb A1c MFr Bld: 5.6 % (ref 4.6–6.5)

## 2024-07-02 LAB — VITAMIN B12: Vitamin B-12: 590 pg/mL (ref 211–911)

## 2024-07-06 ENCOUNTER — Encounter: Payer: Self-pay | Admitting: Oncology

## 2024-07-14 NOTE — Progress Notes (Addendum)
 [name], MD, CAQ Sports Medicine [facility] [address] [place], [address]  Phone: [phone]  FAX: [phone]  Jaime Johnson - 63 y.o. female  MRN [id number]  Date of Birth: [birth year]  Date: 07/15/2024  PCP: Watt Mirza, MD  Referral: Watt Mirza, MD  Chief Complaint  Patient presents with   Annual Exam    Part 2   Patient Care Team: Watt Mirza, MD as PCP - General (Family Medicine) Cindie Jesusa HERO, RN as Registered Nurse Dannielle Arlean FALCON, RN (Inactive) as Registered Nurse Maurie Rayfield BIRCH, RN as Oncology Nurse Navigator Lenn Aran, MD as Consulting Physician (Radiation Oncology) Elby Webb Loges, MD as Referring Physician (Obstetrics) Melanee Annah BROCKS, MD as Consulting Physician (Oncology) Subjective:   Jaime Johnson is a 63 y.o. pleasant patient who presents with the following:  Discussed the use of AI scribe software for clinical note transcription with the patient, who gave verbal consent to proceed.  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 -Most recent chest abdomen pelvis CT from June did not show any recurrent or metastatic disease.  She was having some neuropathy postchemotherapy. -Hypothyroidism and hypocalcemia.  Body limits activity Legs, feet, and pelvis limit her  GYN sensation is gone Some rectal lack of tone  The 10-year ASCVD risk score (Arnett DK, et al., 2019) is: 11.6%   Values used to calculate the score:     Age: 82 years     Clincally relevant sex: Female     Is Non-Hispanic African American: No     Diabetic: No     Tobacco smoker: No     Systolic Blood Pressure: 160 mmHg     Is BP treated: Yes     HDL Cholesterol: 63.5 mg/dL     Total Cholesterol: 278 mg/dL  History of Present Illness Jaime Johnson is a 63 year old female with a history of thyroid  and uterine  cancer who presents with chronic pain management issues.  She reports chronic pain and fatigue, which she states significantly limit her daily activities. The pain primarily affects her legs, feet, and pelvis, and she attributes it to radiation therapy received in January of the previous year. She reports that her symptoms worsened approximately six months after treatment, with instability and pain in her pelvis and a lack of sensation in the pelvic region, which she believes is due to scar tissue from radiation.  She reports bowel incontinence and describes her pelvic area as 'dull and dead'. She reports that her ability to perform daily activities has declined and that she must carefully plan to manage her energy levels.  She has been on tramadol  for over ten years for pain management but finds it no longer effective and causing insomnia and a heavy feeling in the morning. She previously used oxycodone , which she found effective for managing her pain and improving her ability to function the next day.  She has a history of thyroid  cancer and underwent thyroid  surgery, which she finds challenging to manage. She also mentions a history of uterine cancer and undergoes regular CT scans every six months for monitoring.  She reports a recent weight gain of ten pounds since her cancer treatment, attributing it to dietary changes and acknowledging the need to lose weight to feel better. She has a history of high cholesterol and elevated blood sugar levels, with no current  use of cholesterol medication but is on blood pressure medication, specifically losartan  100 mg and HCTZ 12.5 mg.  She experiences 'swishing' sounds in her ears, which she associates with high blood pressure, and notes that her current blood pressure medication may not be effective. No smoking, vaping, or drug use, and she does not consume alcohol.    Health Maintenance Summary Reviewed and updated, unless pt declines services.  Tobacco  History Reviewed. Non-smoker Alcohol: No concerns, no excessive use Exercise Habits: Some activity, rec at least 30 mins 5 times a week - only as able STD concerns: none Drug Use: None Lumps or breast concerns: no  Health Maintenance  Topic Date Due   DTaP/Tdap/Td (2 - Td or Tdap) 07/03/2023   COVID-19 Vaccine (3 - Moderna risk series) 07/31/2024 (Originally 01/23/2020)   Medicare Annual Wellness (AWV)  06/02/2025   Colonoscopy  08/18/2030   Pneumococcal Vaccine: 50+ Years  Completed   Influenza Vaccine  Completed   Hepatitis C Screening  Completed   HIV Screening  Completed   Zoster Vaccines- Shingrix  Completed   Hepatitis B Vaccines 19-59 Average Risk  Aged Out   HPV VACCINES  Aged Out   Meningococcal B Vaccine  Aged Out   Mammogram  Discontinued    Immunization History  Administered Date(s) Administered   Influenza Inj Mdck Quad Pf 09/24/2019   Influenza, Seasonal, Injecte, Preservative Fre 07/24/2016, 07/08/2023   Influenza,trivalent, recombinat, inj, PF 07/02/2024   Moderna Sars-Covid-2 Vaccination 12/05/2019, 12/26/2019   PNEUMOCOCCAL CONJUGATE-20 07/15/2024   Pneumococcal Polysaccharide-23 09/21/2018   Tdap 07/02/2013   Zoster Recombinant(Shingrix) 06/23/2018, 09/15/2018   Patient Active Problem List   Diagnosis Date Noted   Papillary thyroid  carcinoma (HCC) 07/06/2023    Priority: High   Colon cancer (HCC) 05/22/2022    Priority: High   Endometrial carcinoma (HCC) 03/09/2022    Priority: High   Chronic pain syndrome (post-cancer) 07/17/2024    Priority: Medium    Hyperlipidemia LDL goal <100 02/16/2020    Priority: Medium    Essential hypertension 12/19/2016    Priority: Medium    Foraminal stenosis of cervical region (RIGHT) 03/16/2020    Priority: Low   Allergic rhinitis due to pollen     Priority: Low   Encounter for follow-up surveillance of endometrial cancer 04/01/2024   Post-surgical hypothyroidism 05/13/2023   S/P total thyroidectomy 04/30/2023    Bilateral carpal tunnel syndrome 09/29/2019    Past Medical History:  Diagnosis Date   Allergic rhinitis due to pollen    Anemia    Chronic pain syndrome 07/17/2024   Colon cancer (HCC) 05/22/2022   Endometrial cancer (HCC) 2023   Hyperlipidemia LDL goal <100 02/16/2020   Hypertension 12/19/2016   Thyroid  cancer (HCC) 2024    Past Surgical History:  Procedure Laterality Date   ABDOMINAL HYSTERECTOMY     CESAREAN SECTION     COLONOSCOPY WITH PROPOFOL  N/A 11/12/2016   Procedure: COLONOSCOPY WITH PROPOFOL ;  Surgeon: Rogelia Copping, MD;  Location: [facility];  Service: Endoscopy;  Laterality: N/A;   COLONOSCOPY WITH PROPOFOL  N/A 03/29/2017   Procedure: COLONOSCOPY WITH PROPOFOL ;  Surgeon: Copping Rogelia, MD;  Location: [facility];  Service: Endoscopy;  Laterality: N/A;   COLONOSCOPY WITH PROPOFOL  N/A 03/14/2020   Procedure: COLONOSCOPY WITH PROPOFOL ;  Surgeon: Copping Rogelia, MD;  Location: [facility];  Service: Endoscopy;  Laterality: N/A;  priority 4   COLONOSCOPY WITH PROPOFOL  N/A 08/19/2023   Procedure: COLONOSCOPY WITH PROPOFOL ;  Surgeon: Copping Rogelia, MD;  Location: [facility];  Service: Endoscopy;  Laterality: N/A;   CONTINUOUS NERVE MONITORING N/A 04/30/2023   Procedure: CONTINUOUS NERVE MONITORING;  Surgeon: Herminio Miu, MD;  Location: [facility];  Service: ENT;  Laterality: N/A;   ESOPHAGOGASTRODUODENOSCOPY N/A 08/19/2023   Procedure: ESOPHAGOGASTRODUODENOSCOPY (EGD);  Surgeon: Jinny Carmine, MD;  Location: [facility];  Service: Endoscopy;  Laterality: N/A;   IR IMAGING GUIDED PORT INSERTION  06/01/2022   IR REMOVAL TUN ACCESS W/ PORT W/O FL MOD SED  01/17/2023   POLYPECTOMY  11/12/2016   Procedure: POLYPECTOMY;  Surgeon: Carmine Jinny, MD;  Location: [facility];  Service: Endoscopy;;   POLYPECTOMY  03/29/2017   Procedure: POLYPECTOMY;  Surgeon: Jinny Carmine, MD;  Location: [facility];  Service: Endoscopy;;   POLYPECTOMY   08/19/2023   Procedure: POLYPECTOMY;  Surgeon: Jinny Carmine, MD;  Location: [facility];  Service: Endoscopy;;   THYROIDECTOMY N/A 04/30/2023   Procedure: THYROIDECTOMY;  Surgeon: Herminio Miu, MD;  Location: [facility];  Service: ENT;  Laterality: N/A;    Family History  Problem Relation Age of Onset   Hepatitis C Mother 21       d/c at 40   Hypertension Father    Hepatitis C Brother    Breast cancer Neg Hx     Social History   Social History Narrative   Not on file    Past Medical History, Surgical History, Social History, Family History, Problem List, Medications, and Allergies have been reviewed and updated if relevant.  Review of Systems: Pertinent positives are listed above.  Otherwise, a full 14 point review of systems has been done in full and it is negative except where it is noted positive.  Objective:   BP (!) 160/92   Pulse 72   Temp 97.9 F (36.6 C) (Temporal)   Ht 5' 1.75 (1.568 m)   Wt 157 lb 8 oz (71.4 kg)   LMP 02/29/2020 (Approximate) Comment: Has had random spotting; hadn't had a period 1.5 yrs prior  SpO2 100%   BMI 29.04 kg/m  Ideal Body Weight: Weight in (lb) to have BMI = 25: 135.3 No results found.    06/02/2024    3:33 PM 05/19/2024    1:50 PM 05/01/2024    9:38 AM 07/08/2023   11:23 AM 06/22/2022    9:54 AM  Depression screen PHQ 2/9  Decreased Interest 0 0 0 1 1  Down, Depressed, Hopeless 0 0 0 0 1  PHQ - 2 Score 0 0 0 1 2  Altered sleeping 1 0 0  0  Tired, decreased energy 0 0 0  1  Change in appetite 1 0 0  0  Feeling bad or failure about yourself  0 0 0  0  Trouble concentrating 0 0 0  0  Suicidal thoughts 0 0 0  0  PHQ-9 Score 2 0 0  3  Difficult doing work/chores     Somewhat difficult     GEN: well developed, well nourished, no acute distress Eyes: conjunctiva and lids normal, PERRLA, EOMI ENT: TM clear, nares clear, oral exam WNL Neck: supple, no lymphadenopathy, no thyromegaly, no JVD Pulm: clear to auscultation  and percussion, respiratory effort normal CV: regular rate and rhythm, S1-S2, no murmur, rub or gallop, no bruits Chest: no scars, masses, no lumps BREAST: breast exam declined GI: soft, non-tender; no hepatosplenomegaly, masses; active bowel sounds all quadrants GU: GU exam declined Lymph: no cervical, axillary or inguinal adenopathy MSK: gait normal,  muscle tone and strength WNL, no joint swelling, effusions, discoloration, crepitus  SKIN: clear, good turgor, color WNL, no rashes, lesions, or ulcerations Neuro: normal mental status, normal strength, sensation, and motion Psych: alert; oriented to person, place and time, normally interactive and not anxious or depressed in appearance.   All labs reviewed with patient. Results for orders placed or performed in visit on 07/15/24  DRUG MONITORING, PANEL 8 WITH CONFIRMATION, URINE   Collection Time: 07/15/24 10:26 AM  Result Value Ref Range   Alcohol Metabolites NEGATIVE <500 ng/mL   Amphetamines NEGATIVE <500 ng/mL   Benzodiazepines NEGATIVE <100 ng/mL   Buprenorphine, Urine NEGATIVE <5 ng/mL   Cocaine Metabolite NEGATIVE <150 ng/mL   6 Acetylmorphine NEGATIVE <10 ng/mL   Marijuana Metabolite NEGATIVE <20 ng/mL   MDMA NEGATIVE <500 ng/mL   Opiates NEGATIVE <100 ng/mL   Oxycodone  NEGATIVE <100 ng/mL   Creatinine 26.5 > or = 20.0 mg/dL   pH 6.8 4.5 - 9.0   Oxidant NEGATIVE <200 mcg/mL  DM TEMPLATE   Collection Time: 07/15/24 10:26 AM  Result Value Ref Range   Notes and Comments     Lab Review:     Latest Ref Rng & Units 07/02/2024    9:48 AM 03/06/2024    2:13 PM 09/06/2023   10:03 AM  CBC EXTENDED  WBC 4.0 - 10.5 K/uL 3.9  5.1  3.8   RBC 3.87 - 5.11 Mil/uL 5.17  5.35  5.14   Hemoglobin 12.0 - 15.0 g/dL 85.3  84.8  85.6   HCT 36.0 - 46.0 % 43.1  43.7  42.8   Platelets 150.0 - 400.0 K/uL 207.0  193  182   NEUT# 1.4 - 7.7 K/uL 2.1  3.0  2.1   Lymph# 0.7 - 4.0 K/uL 1.4  1.7  1.3        Latest Ref Rng & Units 07/02/2024     9:48 AM 03/06/2024    2:13 PM 09/06/2023   10:03 AM  BMP  Glucose 70 - 99 mg/dL 889  897  892   BUN 6 - 23 mg/dL 15  10  14    Creatinine 0.40 - 1.20 mg/dL 9.07  9.20  9.34   Sodium 135 - 145 mEq/L 145  139  139   Potassium 3.5 - 5.1 mEq/L 4.2  3.3  3.4   Chloride 96 - 112 mEq/L 101  103  104   CO2 19 - 32 mEq/L 33  24  25   Calcium  8.4 - 10.5 mg/dL 9.5  8.8  8.7        Latest Ref Rng & Units 07/02/2024    9:48 AM 03/06/2024    2:13 PM 09/06/2023   10:03 AM  Hepatic Function  Total Protein 6.0 - 8.3 g/dL 7.6  7.7  7.1   Albumin 3.5 - 5.2 g/dL 4.9  4.7  4.3   AST 0 - 37 U/L 32  30  34   ALT 0 - 35 U/L 53  40  48   Alk Phosphatase 39 - 117 U/L 50  58  45   Total Bilirubin 0.2 - 1.2 mg/dL 0.8  1.0  0.8   Bilirubin, Direct 0.0 - 0.3 mg/dL 0.2       Lab Results  Component Value Date   CHOL 278 (H) 07/02/2024   Lab Results  Component Value Date   HDL 63.50 07/02/2024   Lab Results  Component Value Date   LDLCALC 188 (H) 07/02/2024  Lab Results  Component Value Date   TRIG 135.0 07/02/2024   Lab Results  Component Value Date   CHOLHDL 4 07/02/2024   No results for input(s): PSA in the last 72 hours. No results found for: HCVAB Lab Results  Component Value Date   VD25OH 51.94 07/02/2024     Lab Results  Component Value Date   HGBA1C 5.6 07/02/2024   HGBA1C 5.3 03/14/2021   HGBA1C 5.2 02/08/2020   Lab Results  Component Value Date   LDLCALC 188 (H) 07/02/2024   CREATININE 0.92 07/02/2024     Assessment and Plan:     ICD-10-CM   1. Healthcare maintenance  Z00.00     2. Need for vaccination against Streptococcus pneumoniae  Z23 Pneumococcal conjugate vaccine 20-valent (Prevnar 20)    3. Chronic pain syndrome  G89.4 DRUG MONITORING, PANEL 8 WITH CONFIRMATION, URINE    4. Chronic, continuous use of opioids  F11.90 DRUG MONITORING, PANEL 8 WITH CONFIRMATION, URINE    5. Screening mammogram for breast cancer  Z12.31 CANCELED: MM 3D DIAGNOSTIC  MAMMOGRAM BILATERAL BREAST     Jaime Johnson is really struggling from multiple areas of pain after her cancer treatments.  We had a long discussion about chronic pain and we are going to enter into a pain contract.  She had the best relief from oxycodone , so I am going to start her out OxyContin  10 mg at nighttime.  She previously has tolerated immediate release oxycodone  without significant problems. Assessment & Plan Chronic pelvic and lower extremity pain after pelvic radiation Chronic pain post-radiation for uterine cancer, affecting daily activities. Current tramadol  ineffective with side effects. Prefers oxycodone  despite dependency risk. - Discontinue tramadol . - Initiate extended-release oxycodone  pending drug test results. - Enter into a formal pain contract. - Conduct drug test today. - Schedule follow-up in 6 weeks to assess pain management.  Uterine cancer, status post radiation and chemotherapy Uterine cancer treated with radiation and chemotherapy. No recurrence. Significant radiation side effects reported. - Continue regular follow-up with oncology. - Monitor for any signs of recurrence.  Thyroid  cancer, status post thyroidectomy Thyroid  cancer treated with thyroidectomy. No recurrence. Ongoing management challenges reported. - Continue levothyroxine therapy.  Hypertension Hypertension not well controlled. Reports swishing sound in ears, possibly related to elevated blood pressure. - Increase hydrochlorothiazide  to 25 mg. - Send updated prescription to pharmacy.  Hyperlipidemia Elevated LDL cholesterol. No prior cholesterol-lowering medication. Reluctant to start medication.  Weight gain after cancer treatment Weight gain of 10 pounds post-cancer treatment. Acknowledges impact on well-being. - Monitor weight at follow-up visits.  Addendum: 07/22/24 1:29 PM  Spoke to the patient on the phone and insurance did decline OxyContin  10 mg.  We are going to do a trial of oxycodone  5  mg up to twice daily, which she has done and tolerated before.  We can assess how it is working at her follow-up.  Health Maintenance Exam: The patient's preventative maintenance and recommended screening tests for an annual wellness exam were reviewed in full today. Brought up to date unless services declined.  Counselled on the importance of diet, exercise, and its role in overall health and mortality. The patient's FH and SH was reviewed, including their home life, tobacco status, and drug and alcohol status.  Follow-up in 1 year for physical exam or additional follow-up below.  Disposition: Return in about 6 weeks (around 08/26/2024) for pain.  Future Appointments  Date Time Provider Department Center  09/30/2024  8:30 AM [facility] G[facility] ONC [facility]  None  09/30/2024  9:15 AM [facility] LAB [facility] None  09/30/2024  9:30 AM Melanee Annah BROCKS, MD [facility] None  06/08/2025  8:10 AM [facility] ANNUAL WELLNESS VISIT 2 [facility] [address]    Meds ordered this encounter  Medications   losartan -hydrochlorothiazide  (HYZAAR) 100-25 MG tablet    Sig: Take 1 tablet by mouth daily.    Dispense:  90 tablet    Refill:  1   oxyCODONE  (OXYCONTIN ) 10 mg 12 hr tablet    Sig: Take 1 tablet (10 mg total) by mouth at bedtime as needed (pain).    Dispense:  30 tablet    Refill:  0    Refill or prior oxycodone  with conversion to extended release.   Medications Discontinued During This Encounter  Medication Reason   levothyroxine (SYNTHROID) 100 MCG tablet Dose change   buPROPion  (WELLBUTRIN  XL) 150 MG 24 hr tablet Completed Course   lisinopril  (ZESTRIL ) 5 MG tablet    losartan -hydrochlorothiazide  (HYZAAR) 100-12.5 MG tablet    traMADol  (ULTRAM ) 50 MG tablet    Orders Placed This Encounter  Procedures   Pneumococcal conjugate vaccine 20-valent (Prevnar 20)   DRUG MONITORING, PANEL 8 WITH CONFIRMATION, URINE   DM TEMPLATE    Signed,  Tanaysha Alkins T. Abshir Paolini, MD   Allergies as of 07/15/2024        Reactions   Azithromycin Other (See Comments)   Gets a bad yeast infection from this.   Wound Dressing Adhesive Rash   bandaid        Medication List        Accurate as of July 15, 2024 11:59 PM. If you have any questions, ask your nurse or doctor.          STOP taking these medications    buPROPion  150 MG 24 hr tablet Commonly known as: Wellbutrin  XL Stopped by: Jacques Delio Slates   lisinopril  5 MG tablet Commonly known as: ZESTRIL  Stopped by: Jacques Samad Thon   losartan -hydrochlorothiazide  100-12.5 MG tablet Commonly known as: HYZAAR Replaced by: losartan -hydrochlorothiazide  100-25 MG tablet Stopped by: Jacques Kemaya Dorner   traMADol  50 MG tablet Commonly known as: ULTRAM  Stopped by: Jacques Norberta Stobaugh       TAKE these medications    celecoxib 200 MG capsule Commonly known as: CELEBREX Take 200 mg by mouth daily.   diazepam  5 MG tablet Commonly known as: VALIUM  APPLY FEW DROPS OF WATER  TO TABLET PRIOR TO INSERTING 1 TABLET VAGINALLY 30-45 MINUTES PRIOR TO DILATOR EXERCISE 3-4 TIMES A WEEK AS DIRECTED   levothyroxine 88 MCG tablet Commonly known as: SYNTHROID Take 88 mcg by mouth daily. What changed: Another medication with the same name was removed. Continue taking this medication, and follow the directions you see here. Changed by: Jacques Abrar Bilton   lidocaine  5 % ointment Commonly known as: XYLOCAINE  Apply 1 Application topically as needed (for dilator therapy).   losartan -hydrochlorothiazide  100-25 MG tablet Commonly known as: HYZAAR Take 1 tablet by mouth daily. Replaces: losartan -hydrochlorothiazide  100-12.5 MG tablet Started by: Jacques Quinley Nesler   MULTIVITAMIN GUMMIES ADULTS PO Take by mouth daily.   oxyCODONE  10 mg 12 hr tablet Commonly known as: OXYCONTIN  Take 1 tablet (10 mg total) by mouth at bedtime as needed (pain). Started by: Jacques Anevay Campanella   pregabalin  50 MG capsule Commonly known as: Lyrica  Take 1 capsule (50 mg total) by mouth 2  (two) times daily. For neuropathic pain post chemotherapy

## 2024-07-15 ENCOUNTER — Encounter: Payer: Self-pay | Admitting: Family Medicine

## 2024-07-15 ENCOUNTER — Ambulatory Visit: Admitting: Family Medicine

## 2024-07-15 VITALS — BP 160/92 | HR 72 | Temp 97.9°F | Ht 61.75 in | Wt 157.5 lb

## 2024-07-15 DIAGNOSIS — Z23 Encounter for immunization: Secondary | ICD-10-CM

## 2024-07-15 DIAGNOSIS — Z0000 Encounter for general adult medical examination without abnormal findings: Secondary | ICD-10-CM | POA: Diagnosis not present

## 2024-07-15 DIAGNOSIS — F1190 Opioid use, unspecified, uncomplicated: Secondary | ICD-10-CM | POA: Diagnosis not present

## 2024-07-15 DIAGNOSIS — G894 Chronic pain syndrome: Secondary | ICD-10-CM

## 2024-07-15 DIAGNOSIS — Z1231 Encounter for screening mammogram for malignant neoplasm of breast: Secondary | ICD-10-CM | POA: Diagnosis not present

## 2024-07-15 MED ORDER — LOSARTAN POTASSIUM-HCTZ 100-25 MG PO TABS: 1.0000 | ORAL_TABLET | Freq: Every day | ORAL | 1 refills | Status: AC

## 2024-07-15 NOTE — Patient Instructions (Signed)
 Increased dose of losartan /hydrochlorothiazide  to 100/25 mg

## 2024-07-16 LAB — DRUG MONITORING, PANEL 8 WITH CONFIRMATION, URINE
6 Acetylmorphine: NEGATIVE ng/mL (ref <10)
Alcohol Metabolites: NEGATIVE ng/mL (ref <500)
Amphetamines: NEGATIVE ng/mL (ref <500)
Benzodiazepines: NEGATIVE ng/mL (ref <100)
Buprenorphine, Urine: NEGATIVE ng/mL (ref <5)
Cocaine Metabolite: NEGATIVE ng/mL (ref <150)
Creatinine: 26.5 mg/dL (ref >=20.0)
MDMA: NEGATIVE ng/mL (ref <500)
Marijuana Metabolite: NEGATIVE ng/mL (ref <20)
Opiates: NEGATIVE ng/mL (ref <100)
Oxidant: NEGATIVE ug/mL (ref <200)
Oxycodone: NEGATIVE ng/mL (ref <100)
pH: 6.8 (ref 4.5–9.0)

## 2024-07-16 LAB — DM TEMPLATE

## 2024-07-17 ENCOUNTER — Ambulatory Visit: Payer: Self-pay | Admitting: Family Medicine

## 2024-07-17 ENCOUNTER — Encounter: Payer: Self-pay | Admitting: Family Medicine

## 2024-07-17 DIAGNOSIS — G894 Chronic pain syndrome: Secondary | ICD-10-CM | POA: Insufficient documentation

## 2024-07-17 MED ORDER — OXYCODONE HCL ER 10 MG PO T12A: 10.0000 mg | EXTENDED_RELEASE_TABLET | Freq: Every evening | ORAL | 0 refills | Status: DC | PRN

## 2024-07-20 ENCOUNTER — Encounter: Payer: Self-pay | Admitting: Oncology

## 2024-07-20 ENCOUNTER — Telehealth: Payer: Self-pay

## 2024-07-20 ENCOUNTER — Other Ambulatory Visit (HOSPITAL_COMMUNITY): Payer: Self-pay

## 2024-07-20 NOTE — Telephone Encounter (Signed)
 Error

## 2024-07-21 ENCOUNTER — Telehealth: Payer: Self-pay | Admitting: Pharmacy Technician

## 2024-07-21 NOTE — Telephone Encounter (Signed)
 Pharmacy Patient Advocate Encounter  Received notification from [facility] [employer] that Prior Authorization for OxyCONTIN  10MG  er tablets  has been DENIED.  Full denial letter will be uploaded to the media tab. See denial reason below.   PA #/Case ID/Reference #: [id number]

## 2024-07-22 ENCOUNTER — Other Ambulatory Visit (HOSPITAL_COMMUNITY): Payer: Self-pay

## 2024-07-22 MED ORDER — OXYCODONE HCL 5 MG PO TABS: 5.0000 mg | ORAL_TABLET | Freq: Two times a day (BID) | ORAL | 0 refills | Status: DC | PRN

## 2024-07-22 NOTE — Telephone Encounter (Signed)
 I discussed with the patient on the phone, and she is going to do a trial of some oxycodone  5 mg that she has used in the past.

## 2024-07-22 NOTE — Telephone Encounter (Signed)
 Received a fax from the patient's insurance requesting additional information.     Tried a test claim for Xtampza  ER 9mg  caps #30 and the copay is $47 and does not require a prior authorization.   Would you like to change the medication or can you provide info on why Xtampza  cannot be taken?  Please advise  Document indexed to media tab

## 2024-07-22 NOTE — Addendum Note (Signed)
 Addended by: WATT MIRZA on: 07/22/2024 01:30 PM   Modules accepted: Orders

## 2024-07-23 ENCOUNTER — Other Ambulatory Visit (HOSPITAL_COMMUNITY): Payer: Self-pay

## 2024-08-05 DIAGNOSIS — R7309 Other abnormal glucose: Secondary | ICD-10-CM | POA: Diagnosis not present

## 2024-08-05 DIAGNOSIS — G620 Drug-induced polyneuropathy: Secondary | ICD-10-CM | POA: Diagnosis not present

## 2024-08-05 DIAGNOSIS — G5603 Carpal tunnel syndrome, bilateral upper limbs: Secondary | ICD-10-CM | POA: Diagnosis not present

## 2024-08-05 DIAGNOSIS — R2990 Unspecified symptoms and signs involving the nervous system: Secondary | ICD-10-CM | POA: Diagnosis not present

## 2024-08-05 DIAGNOSIS — T451X5A Adverse effect of antineoplastic and immunosuppressive drugs, initial encounter: Secondary | ICD-10-CM | POA: Diagnosis not present

## 2024-08-06 ENCOUNTER — Other Ambulatory Visit: Payer: Self-pay | Admitting: Neurology

## 2024-08-06 DIAGNOSIS — R2990 Unspecified symptoms and signs involving the nervous system: Secondary | ICD-10-CM

## 2024-08-11 ENCOUNTER — Ambulatory Visit
Admission: RE | Admit: 2024-08-11 | Discharge: 2024-08-11 | Disposition: A | Discharge status: Home / Self Care | Source: Ambulatory Visit | Attending: Neurology | Admitting: Neurology

## 2024-08-11 DIAGNOSIS — R2990 Unspecified symptoms and signs involving the nervous system: Secondary | ICD-10-CM | POA: Insufficient documentation

## 2024-08-11 DIAGNOSIS — R202 Paresthesia of skin: Secondary | ICD-10-CM | POA: Diagnosis not present

## 2024-08-11 DIAGNOSIS — C73 Malignant neoplasm of thyroid gland: Secondary | ICD-10-CM | POA: Diagnosis not present

## 2024-08-26 ENCOUNTER — Telehealth: Payer: Self-pay | Admitting: Oncology

## 2024-08-26 NOTE — Telephone Encounter (Signed)
 Pt would like to speak with [facility], please call pt back, thank you

## 2024-09-07 ENCOUNTER — Ambulatory Visit: Admitting: Oncology

## 2024-09-07 ENCOUNTER — Other Ambulatory Visit

## 2024-09-21 ENCOUNTER — Ambulatory Visit: Admitting: Oncology

## 2024-09-21 ENCOUNTER — Other Ambulatory Visit

## 2024-09-28 ENCOUNTER — Encounter: Payer: Self-pay | Admitting: Oncology

## 2024-09-29 ENCOUNTER — Other Ambulatory Visit: Payer: Self-pay

## 2024-09-29 DIAGNOSIS — Z8542 Personal history of malignant neoplasm of other parts of uterus: Secondary | ICD-10-CM

## 2024-09-30 ENCOUNTER — Ambulatory Visit

## 2024-09-30 ENCOUNTER — Ambulatory Visit
Admission: RE | Admit: 2024-09-30 | Discharge: 2024-09-30 | Disposition: A | Discharge status: Home / Self Care | Source: Ambulatory Visit | Attending: Nurse Practitioner | Admitting: Nurse Practitioner

## 2024-09-30 ENCOUNTER — Ambulatory Visit: Admitting: Oncology

## 2024-09-30 ENCOUNTER — Encounter: Payer: Self-pay | Admitting: Oncology

## 2024-09-30 ENCOUNTER — Inpatient Hospital Stay: Attending: Oncology

## 2024-09-30 VITALS — BP 142/68 | HR 68 | Temp 96.0°F | Ht 61.75 in | Wt 159.0 lb

## 2024-09-30 VITALS — BP 140/77 | HR 66 | Temp 96.6°F | Resp 18 | Ht 61.75 in | Wt 158.4 lb

## 2024-09-30 DIAGNOSIS — Z9079 Acquired absence of other genital organ(s): Secondary | ICD-10-CM | POA: Insufficient documentation

## 2024-09-30 DIAGNOSIS — Z90710 Acquired absence of both cervix and uterus: Secondary | ICD-10-CM | POA: Insufficient documentation

## 2024-09-30 DIAGNOSIS — M533 Sacrococcygeal disorders, not elsewhere classified: Secondary | ICD-10-CM | POA: Insufficient documentation

## 2024-09-30 DIAGNOSIS — Z8542 Personal history of malignant neoplasm of other parts of uterus: Secondary | ICD-10-CM

## 2024-09-30 DIAGNOSIS — C541 Malignant neoplasm of endometrium: Secondary | ICD-10-CM | POA: Diagnosis not present

## 2024-09-30 DIAGNOSIS — R102 Pelvic and perineal pain unspecified side: Secondary | ICD-10-CM | POA: Diagnosis not present

## 2024-09-30 DIAGNOSIS — Z923 Personal history of irradiation: Secondary | ICD-10-CM | POA: Diagnosis not present

## 2024-09-30 DIAGNOSIS — M1990 Unspecified osteoarthritis, unspecified site: Secondary | ICD-10-CM | POA: Insufficient documentation

## 2024-09-30 DIAGNOSIS — Z87891 Personal history of nicotine dependence: Secondary | ICD-10-CM | POA: Diagnosis not present

## 2024-09-30 DIAGNOSIS — Z08 Encounter for follow-up examination after completed treatment for malignant neoplasm: Secondary | ICD-10-CM

## 2024-09-30 DIAGNOSIS — Z9221 Personal history of antineoplastic chemotherapy: Secondary | ICD-10-CM | POA: Insufficient documentation

## 2024-09-30 DIAGNOSIS — Z90722 Acquired absence of ovaries, bilateral: Secondary | ICD-10-CM | POA: Insufficient documentation

## 2024-09-30 LAB — CMP (CANCER CENTER ONLY)
ALT: 110 U/L — ABNORMAL HIGH (ref 0–44)
AST: 55 U/L — ABNORMAL HIGH (ref 15–41)
Albumin: 4.8 g/dL (ref 3.5–5.0)
Alkaline Phosphatase: 61 U/L (ref 38–126)
Anion gap: 13 (ref 5–15)
BUN: 19 mg/dL (ref 8–23)
CO2: 28 mmol/L (ref 22–32)
Calcium: 9.7 mg/dL (ref 8.9–10.3)
Chloride: 99 mmol/L (ref 98–111)
Creatinine: 0.94 mg/dL (ref 0.44–1.00)
GFR, Estimated: >60 mL/min (ref >60)
Glucose, Bld: 124 mg/dL — ABNORMAL HIGH (ref 70–99)
Potassium: 3.7 mmol/L (ref 3.5–5.1)
Sodium: 140 mmol/L (ref 135–145)
Total Bilirubin: 0.6 mg/dL (ref 0.0–1.2)
Total Protein: 8 g/dL (ref 6.5–8.1)

## 2024-09-30 LAB — CBC WITH DIFFERENTIAL (CANCER CENTER ONLY)
Abs Immature Granulocytes: 0.02 K/uL (ref 0.00–0.07)
Basophils Absolute: 0.1 K/uL (ref 0.0–0.1)
Basophils Relative: 1 %
Eosinophils Absolute: 0 K/uL (ref 0.0–0.5)
Eosinophils Relative: 1 %
HCT: 42.9 % (ref 36.0–46.0)
Hemoglobin: 14.7 g/dL (ref 12.0–15.0)
Immature Granulocytes: 0 %
Lymphocytes Relative: 32 %
Lymphs Abs: 1.8 K/uL (ref 0.7–4.0)
MCH: 28.4 pg (ref 26.0–34.0)
MCHC: 34.3 g/dL (ref 30.0–36.0)
MCV: 83 fL (ref 80.0–100.0)
Monocytes Absolute: 0.4 K/uL (ref 0.1–1.0)
Monocytes Relative: 7 %
Neutro Abs: 3.3 K/uL (ref 1.7–7.7)
Neutrophils Relative %: 59 %
Platelet Count: 203 K/uL (ref 150–400)
RBC: 5.17 MIL/uL — ABNORMAL HIGH (ref 3.87–5.11)
RDW: 12.1 % (ref 11.5–15.5)
WBC Count: 5.6 K/uL (ref 4.0–10.5)
nRBC: 0 % (ref 0.0–0.2)

## 2024-09-30 NOTE — Progress Notes (Addendum)
 Hematology/Oncology Consult note [facility]  [phone] [phone]  Patient Care Team: Watt Mirza, MD as PCP - General (Family Medicine) Cindie Jesusa HERO, RN as Registered Nurse Dannielle Arlean FALCON, RN (Inactive) as Registered Nurse Maurie Rayfield BIRCH, RN as Oncology Nurse Navigator Lenn Aran, MD as Consulting Physician (Radiation Oncology) Elby Webb Loges, MD as Referring Physician (Obstetrics) Melanee Annah BROCKS, MD as Consulting Physician (Oncology)   Name of the patient: Jaime Johnson  [id number]  [birth year]   Date of visit: 09/30/24  Diagnosis- 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 Burnard from GYN oncology at [facility].  She underwent robotic surgery on 03/30/2022.  Final pathology showed:    Final Pathologic Diagnosis  A. SENTINEL LYMPH NODE, RIGHT EXTERNAL ILIAC ARTERY, RESECTION: Two lymph nodes, negative for metastatic carcinoma (0/2).    B. SENTINEL LYMPH NODE, LEFT EXTERNAL ILIAC ARTERY, RESECTION: One lymph node, negative for metastatic carcinoma (0/1).    C. UTERUS, FALLOPIAN TUBE AND OVARIES, HYSTERECTOMY WITH BILATERAL SALPINGO-OOPHORECTOMY: Endometrioid adenocarcinoma, FIGO grade 2. Tumor measures 5 cm in greatest dimension.  Tumor invades 14 of 17 mm thick myometrium (greater than 50%).  Margins of resection are uninvolved by tumor.  Tumor involves lower uterine segment.  Cervix is uninvolved by tumor.  Benign right fallopian tube and ovary.  Left ovary with tumor located within lymphovascular spaces, no tissue invasion identified.  Left fallopian tube with tumor within lymphovascular spaces, no tissue invasion identified. Free  floating tumor located inside left fallopian tube.  pT1b pN0    She was recommended adjuvant chemotherapy with carbo Taxol  for 3 cycles by Dr. Burnard followed by vaginal brachytherapy.  Patient states that she does not wish to travel all the way to [facility] given her ongoing fatigue as well as discomfort from episiotomy and would prefer to get treatment locally.  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  She is not a diabetic.  She has baseline rheumatoid arthritis   Pathology was also second for a second opinion to [facility] as well.  Path report over there suggested that this was a T3a tumor FIGO stage IIIa disease.  Patient received 6 cycles of CarboTaxol chemotherapy as well as pelvic radiation   Patient was diagnosed with thyroidNodule which was biopsy-proven papillary thyroid  carcinoma with follicular variant.  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  She follows up with endocrinology for the same    Interval history-  Jaime Johnson is a 63 year old female with endometrial cancer, status post pelvic radiation therapy, who presents for post-treatment surveillance and management of chronic pain.  She experiences severe chronic pain localized to the area inferior to the coccyx, which is exacerbated by rising from a seated position and during passage of flatus, necessitating bracing for support.  She reports new onset knee pain and generalized nocturnal muscle tightening. She previously managed musculoskeletal discomfort and arthritis with tramadol  for over five years, but discontinued due to insomnia. She transitioned to intermittent oxycodone  use since October 2025, including prior to sexual activity, but has not required it recently for that indication.  She previously participated in pelvic  physical therapy without benefit and now performs daily home  exercises for symptom management. She expresses psychological distress related to the impact of radiation therapy on her sense of femininity and is concerned about her mental health as she adjusts to changes in her pain regimen.     ECOG PS- 1 Pain scale- 0   Review of systems- Review of Systems  Constitutional:  Positive for malaise/fatigue. Negative for chills, fever and weight loss.  HENT:  Negative for congestion, ear discharge and nosebleeds.   Eyes:  Negative for blurred vision.  Respiratory:  Negative for cough, hemoptysis, sputum production, shortness of breath and wheezing.   Cardiovascular:  Negative for chest pain, palpitations, orthopnea and claudication.  Gastrointestinal:  Negative for abdominal pain, blood in stool, constipation, diarrhea, heartburn, melena, nausea and vomiting.  Genitourinary:  Negative for dysuria, flank pain, frequency, hematuria and urgency.  Musculoskeletal:  Negative for back pain, joint pain and myalgias.       Tailbone pain  Skin:  Negative for rash.  Neurological:  Negative for dizziness, tingling, focal weakness, seizures, weakness and headaches.  Endo/Heme/Allergies:  Does not bruise/bleed easily.  Psychiatric/Behavioral:  Negative for depression and suicidal ideas. The patient does not have insomnia.       Allergies  Allergen Reactions   Azithromycin Other (See Comments)    Gets a bad yeast infection from this.   Wound Dressing Adhesive Rash    bandaid     Past Medical History:  Diagnosis Date   Allergic rhinitis due to pollen    Anemia    Chronic pain syndrome 07/17/2024   Colon cancer (HCC) 05/22/2022   Endometrial cancer (HCC) 2023   Hyperlipidemia LDL goal <100 02/16/2020   Hypertension 12/19/2016   Thyroid  cancer (HCC) 2024     Past Surgical History:  Procedure Laterality Date   ABDOMINAL HYSTERECTOMY     CESAREAN SECTION     COLONOSCOPY WITH PROPOFOL  N/A 11/12/2016   Procedure: COLONOSCOPY WITH PROPOFOL ;  Surgeon:  Rogelia Copping, MD;  Location: [facility];  Service: Endoscopy;  Laterality: N/A;   COLONOSCOPY WITH PROPOFOL  N/A 03/29/2017   Procedure: COLONOSCOPY WITH PROPOFOL ;  Surgeon: Copping Rogelia, MD;  Location: [facility];  Service: Endoscopy;  Laterality: N/A;   COLONOSCOPY WITH PROPOFOL  N/A 03/14/2020   Procedure: COLONOSCOPY WITH PROPOFOL ;  Surgeon: Copping Rogelia, MD;  Location: [facility];  Service: Endoscopy;  Laterality: N/A;  priority 4   COLONOSCOPY WITH PROPOFOL  N/A 08/19/2023   Procedure: COLONOSCOPY WITH PROPOFOL ;  Surgeon: Copping Rogelia, MD;  Location: [facility];  Service: Endoscopy;  Laterality: N/A;   CONTINUOUS NERVE MONITORING N/A 04/30/2023   Procedure: CONTINUOUS NERVE MONITORING;  Surgeon: Herminio Miu, MD;  Location: [facility] ORS;  Service: ENT;  Laterality: N/A;   ESOPHAGOGASTRODUODENOSCOPY N/A 08/19/2023   Procedure: ESOPHAGOGASTRODUODENOSCOPY (EGD);  Surgeon: Copping Rogelia, MD;  Location[facility];  Service: Endoscopy;  Laterality: N/A;   IR IMAGING GUIDED PORT INSERTION  06/01/2022   IR REMOVAL TUN ACCESS W/ PORT W/O FL MOD SED  01/17/2023   POLYPECTOMY  11/12/2016   Procedure: POLYPECTOMY;  Surgeon: Rogelia Copping, MD;  Location: [facility];  Service: Endoscopy;;   POLYPECTOMY  03/29/2017   Procedure: POLYPECTOMY;  Surgeon: Copping Rogelia, MD;  Location: [facility];  Service: Endoscopy;;   POLYPECTOMY  08/19/2023   Procedure: POLYPECTOMY;  Surgeon: Copping Rogelia, MD;  Location: [facility];  Service: Endoscopy;;   THYROIDECTOMY N/A 04/30/2023   Procedure: THYROIDECTOMY;  Surgeon:  Herminio Miu, MD;  Location: [facility] OR[facility];  Service: ENT;  Laterality: N/A;    Social History   Socioeconomic History   Marital status: Married    Spouse name: Not on file   Number of children: Not on file   Years of education: Not on file   Highest education level: Associate degree: academic program  Occupational History   Not on  file  Tobacco Use   Smoking status: Former    Current packs/day: 0.00    Types: Cigarettes    Quit date: 10/22/1978    Years since quitting: 45.9   Smokeless tobacco: Never  Vaping Use   Vaping status: Never Used  Substance and Sexual Activity   Alcohol use: No   Drug use: No   Sexual activity: Yes    Partners: Male  Other Topics Concern   Not on file  Social History Narrative   Not on file   Social Drivers of Health   Financial Resource Strain: Low Risk  (08/05/2024)   Received from [facility]   Overall Financial Resource Strain (CARDIA)    Difficulty of Paying Living Expenses: Not very hard  Food Insecurity: No Food Insecurity (08/05/2024)   Received from [facility]   Hunger Vital Sign    Within the past 12 months, you worried that your food would run out before you got the money to buy more.: Never true    Within the past 12 months, the food you bought just didn't last and you didn't have money to get more.: Never true  Transportation Needs: No Transportation Needs (08/05/2024)   Received from [facility] - Transportation    In the past 12 months, has lack of transportation kept you from medical appointments or from getting medications?: No    Lack of Transportation (Non-Medical): No  Physical Activity: Insufficiently Active (07/11/2024)   Exercise Vital Sign    Days of Exercise per Week: 2 days    Minutes of Exercise per Session: 20 min  Stress: Stress Concern Present (07/11/2024)   [employer] of Occupational Health - Occupational Stress Questionnaire    Feeling of Stress: To some extent  Social Connections: Socially Integrated (07/11/2024)   Social Connection and Isolation Panel    Frequency of Communication with Friends and Family: More than three times a week    Frequency of Social Gatherings with Friends and Family: More than three times a week    Attends Religious Services: More than 4 times  per year    Active Member of [employer] or Organizations: Yes    Attends Engineer, Structural: More than 4 times per year    Marital Status: Married  Catering Manager Violence: Not At Risk (06/02/2024)   Humiliation, Afraid, Rape, and Kick questionnaire    Fear of Current or Ex-Partner: No    Emotionally Abused: No    Physically Abused: No    Sexually Abused: No    Family History  Problem Relation Age of Onset   Hepatitis C Mother 60       d/c at 20   Hypertension Father    Hepatitis C Brother    Breast cancer Neg Hx      Current Outpatient Medications:    celecoxib (CELEBREX) 200 MG capsule, Take 200 mg by mouth daily., Disp: , Rfl:    levothyroxine (SYNTHROID) 88 MCG tablet, Take 88 mcg by mouth daily., Disp: , Rfl:    lidocaine  (XYLOCAINE ) 5 %  ointment, Apply 1 Application topically as needed (for dilator therapy)., Disp: 30 g, Rfl: 0   losartan -hydrochlorothiazide  (HYZAAR) 100-25 MG tablet, Take 1 tablet by mouth daily., Disp: 90 tablet, Rfl: 1   Multiple Vitamin (MULTI-VITAMIN) tablet, Take 1 tablet by mouth daily., Disp: , Rfl:    Multiple Vitamins-Minerals (MULTIVITAMIN GUMMIES ADULTS PO), Take by mouth daily., Disp: , Rfl:    oxyCODONE  (ROXICODONE ) 5 MG immediate release tablet, Take 1 tablet (5 mg total) by mouth 2 (two) times daily as needed for severe pain (pain score 7-10)., Disp: 60 tablet, Rfl: 0   pregabalin  (LYRICA ) 50 MG capsule, Take 1 capsule (50 mg total) by mouth 2 (two) times daily. For neuropathic pain post chemotherapy, Disp: 60 capsule, Rfl: 0   topiramate (TOPAMAX) 25 MG tablet, Take by mouth., Disp: , Rfl:    diazepam  (VALIUM ) 5 MG tablet, APPLY FEW DROPS OF WATER  TO TABLET PRIOR TO INSERTING 1 TABLET VAGINALLY 30-45 MINUTES PRIOR TO DILATOR EXERCISE 3-4 TIMES A WEEK AS DIRECTED (Patient not taking: Reported on 09/30/2024), Disp: 30 tablet, Rfl: 0  Physical exam:  Vitals:   09/30/24 0933  BP: (!) 140/77  Pulse: 66  Resp: 18  Temp: (!) 96.6 F  (35.9 C)  TempSrc: Tympanic  SpO2: 99%  Weight: 158 lb 6.4 oz (71.8 kg)  Height: 5' 1.75 (1.568 m)   Physical Exam Cardiovascular:     Rate and Rhythm: Normal rate and regular rhythm.     Heart sounds: Normal heart sounds.  Pulmonary:     Effort: Pulmonary effort is normal.     Breath sounds: Normal breath sounds.  Abdominal:     General: Bowel sounds are normal. There is no distension.     Palpations: Abdomen is soft.     Tenderness: There is no abdominal tenderness.  Skin:    General: Skin is warm and dry.  Neurological:     Mental Status: She is alert and oriented to person, place, and time.      I have personally reviewed labs listed below:    Latest Ref Rng & Units 09/30/2024    9:18 AM  CMP  Glucose 70 - 99 mg/dL 875   BUN 8 - 23 mg/dL 19   Creatinine 9.55 - 1.00 mg/dL 9.05   Sodium 864 - 854 mmol/L 140   Potassium 3.5 - 5.1 mmol/L 3.7   Chloride 98 - 111 mmol/L 99   CO2 22 - 32 mmol/L 28   Calcium  8.9 - 10.3 mg/dL 9.7   Total Protein 6.5 - 8.1 g/dL 8.0   Total Bilirubin 0.0 - 1.2 mg/dL 0.6   Alkaline Phos 38 - 126 U/L 61   AST 15 - 41 U/L 55   ALT 0 - 44 U/L 110       Latest Ref Rng & Units 09/30/2024    9:18 AM  CBC  WBC 4.0 - 10.5 K/uL 5.6   Hemoglobin 12.0 - 15.0 g/dL 85.2   Hematocrit 63.9 - 46.0 % 42.9   Platelets 150 - 400 K/uL 203     Assessment and plan- Patient is a 63 y.o. female here for a routine surveillance visit for endometrial cancer  Assessment and Plan    Endometrial cancer, post-treatment surveillance Undergoing surveillance post-radiation therapy with persistent radiation-induced side effects.  Clinically patient is doing well with no concerning signs and symptoms of recurrence based on today's exam.  She has GYN oncology appointment coming up again in 6 months and I will see  her back in 1 year with labs  Chronic pain related to cancer treatment Chronic pelvic and coccygeal pain post-radiation, managed with intermittent  oxycodone  and daily exercises. Prefers self-directed therapy over formal pelvic physical therapy. - Continue intermittent oxycodone  for pain management and monitor response. - Continue daily exercises for symptom management.  Osteoarthritis Osteoarthritis with new knee pain and chronic musculoskeletal discomfort. Possible causes include osteoarthritis or radiation-induced changes. - Obtain x-ray of knee and coccyx to evaluate pain etiology.         Visit Diagnosis 1. Encounter for follow-up surveillance of endometrial cancer      Dr. Annah Skene, MD, MPH [facility] [phone] 09/30/2024 3:12 PM

## 2024-09-30 NOTE — Progress Notes (Signed)
 Patient states she's feeling the best she's ever felt in a long while. She does not have any new or acute concerns at this time.

## 2024-09-30 NOTE — Progress Notes (Unsigned)
 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 G80P1 female who is seen in consultation from Dr. Melanee for endometrial cancer s/p RA-TLH-BSO with SLND and perineal repair at [facility] on 03/31/22, NED since, who presents for pelvic exam.   She complains of sacral pain that is new in last 2 months. She has chronic pelvic pain and fatigue with generalized intermittent weakness since undergoing treatment. Not worsening.   Last imaging in July was negative    ***She was see by Dr Elby June 2025 and complained of back pain at that time. CT scan was obtained 05/08/24 which was negative for recurrent or metastatic disease within the chest, abdomen, pelvis. Stable benign hepatic hemangioma and left adrenal adenoma. Cholelithiasis. No radiographic evidence of cholecystitis. Hepatic steatosis.  Today, she feels that back pain is stable. She continues to use vaginal moisturizers and dilators. She is sexually active and continues to be followed by sexual health clinic. She complains of ongoing fatigue and generalized weakness. Doesn't feel as strong as she did prior to undergoing treatment. She has not been able to exercise consistently due to cramping. She has chronic peripheral neuropathy. Denies bleeding, spotting, discharge.     Gyn Onc History: Patient is a pleasant female who is seen in consultation from Dr. Melanee for endometrial cancer s/p RA-TLH-BSO with SLND and perineal repair at [facility] on 03/31/22.   She presented with PMB to gyn. Biopsy showed endometrial cancer and was referred to Dr. Burnard from [facility]. She underwent RA-TLH-BSo on 03/30/2022.   Final Pathologic Diagnosis  A. SENTINEL LYMPH NODE, RIGHT EXTERNAL ILIAC ARTERY, RESECTION: Two lymph  nodes, negative for metastatic carcinoma (0/2).    B. SENTINEL LYMPH NODE, LEFT EXTERNAL ILIAC ARTERY, RESECTION: One lymph node, negative for metastatic carcinoma (0/1).    C. UTERUS, FALLOPIAN TUBE AND OVARIES, HYSTERECTOMY WITH BILATERAL SALPINGO-OOPHORECTOMY: Endometrioid adenocarcinoma, FIGO grade 2. Tumor measures 5 cm in greatest dimension.  Tumor invades 14 of 17 mm thick myometrium (greater than 50%).  Margins of resection are uninvolved by tumor.  Tumor involves lower uterine segment.  Cervix is uninvolved by tumor.  Benign right fallopian tube and ovary.  Left ovary with tumor located within lymphovascular spaces, no tissue invasion identified.  Left fallopian tube with tumor within lymphovascular spaces, no tissue invasion identified. Free floating tumor located inside left fallopian tube.  pT1b pN0   Surgery was complicated by vaginal laceration/episiotomy. During the repair, the tip of the needle broke off. Xray was performed and confirmed 'curvilinear suture needle projects over the left labial tissue/left perineum'. Needle was located using fluoroscopy and was removed in its entirety and incision was then repaired. Surgery was prolonged due to this with estimated time of ~ 7 hours.    Post op course was then complicated by fever requiring hospitalization for another week.    Dr. Burnard saw patient, recommended adjuvant chemotherapy with Carbo Taxol  for 3 cycles followed by vaginal brachytherapy.   Patient transferred care to [facility] and has been followed by Dr. Melanee with medical oncology. 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 She is not a diabetic. She has baseline rheumatoid arthritis.   Pathology was also second for a second opinion to [facility] as well. Path report suggested that this was a  T3a tumor FIGO stage IIIa disease. Left ovary and fallopian tube: Positive for carcinoma, lymphovascular involvement. Microcystic elongated fragmented (MELF) pattern of invasion  and associated lymphovascular invasion.     1. Right external iliac artery sentinel lymph node, lymphadenectomy: - Two lymph nodes, negative for malignancy (0/2).   2.  Left external iliac artery, sentinel lymph node, lymphadenectomy: - One lymph node, suspicious for isolated tumor cells, see comment.   Comment: The paraffin block was requested for keratin immunohistochemistry, but the referring institution declined the request.  Since we were unable to obtain material for immunohistochemistry, this node will be classified as negative for synoptic reporting purposes and staging.    MLH1: Retained expression (wild-type). PMS2: Retained expression (wild-type). MSH2: Retained expression (wild-type). MSH6: Retained expression (wild-type).  P53 immunohistochemistry:   Wild type pattern (stain performed at [facility] on outside block C5).    PET 06/05/22 IMPRESSION: 1. Status post hysterectomy without suspicious hypermetabolic soft tissue nodularity along the vaginal cuff to suggest residual disease. 2. No convincing evidence of hypermetabolic metastatic disease. 3. Mild mesenteric and omental stranding without abnormal FDG avid nodularity possibly reflecting postsurgical change, suggest attention on follow-up imaging. 4. Incidental 1.4 cm right thyroid  nodule with slightly increased uptake. Recommend nonemergent thyroid  US  and biopsy of nodule if detected. Reference: J Am Coll Radiol. 2015 Feb;12(2): 143-50 5. Slightly increased background thyroid  activity is nonspecific but may reflect thyroiditis recommend correlation with laboratory values. 6.  Aortic Atherosclerosis (ICD10-I70.0).   Received 3 cycles of CarboTaxol chemotherapy by Dr. Melanee. She required G-CSF for leukopenia.   10/23 CT scan showed no evidence of disease.   [facility] Tumor Board recommended completion of 6 cycles of carboplatin /paclitaxel  followed by repeat PET versus CT scan to assess status of vaginal nodularity. If nodularity present  and concerning for persistent disease (FDG avid or biopsy-proven) could either treat with EBRT + VBT or surgical resection followed by VBT. Imaging findings could be postoperative in nature given proximity to surgery.    10/08/22 6 cycles of carbo-taxol  completed   10/29/22  vaginal brachytherapy completed    01/07/23 CT C/A/P IMPRESSION: - No developing new mass lesion, fluid collection or lymph node enlargement. The areas of stranding in the mesentery are less visible today. No new areas of nodularity or ascites.  - Fatty liver infiltration.  Gallstones.  Hepatic hemangioma. - Stable left adrenal nodule when adjusted for variances in technique.   06/05/2023 CT CHEST, ABDOMEN, AND PELVIS WITH CONTRAST  IMPRESSION: 1. No evidence of metastatic disease in the chest, abdomen or pelvis. 2. Questionable asymmetric wall thickening versus underdistention of the rectum, consider correlation with direct inspection. 3. Mild symmetric distal esophageal wall thickening, correlate for esophagitis. 4. Diffuse hepatic steatosis. 5.  Aortic Atherosclerosis   She has had issues with sexual function and uses vaginal dilator therapy, vaginal moisturizers and has used vaginal estrogen.  ***Add CT 05/08/24 and MRI 08/11/24  Allergies  Allergen Reactions   Azithromycin Other (See Comments)    Gets a bad yeast infection from this.   Wound Dressing Adhesive Rash    bandaid   Past Medical History:  Diagnosis Date   Allergic rhinitis due to pollen    Anemia    Chronic pain syndrome 07/17/2024   Colon cancer (HCC) 05/22/2022   Endometrial cancer (HCC) 2023   Hyperlipidemia LDL goal <100 02/16/2020   Hypertension 12/19/2016   Thyroid  cancer (HCC) 2024   Past Surgical History:  Procedure Laterality Date   ABDOMINAL HYSTERECTOMY  CESAREAN SECTION     COLONOSCOPY WITH PROPOFOL  N/A 11/12/2016   Procedure: COLONOSCOPY WITH PROPOFOL ;  Surgeon: Rogelia Copping, MD;  Location: [facility];  Service:  Endoscopy;  Laterality: N/A;   COLONOSCOPY WITH PROPOFOL  N/A 03/29/2017   Procedure: COLONOSCOPY WITH PROPOFOL ;  Surgeon: Copping Rogelia, MD;  Location: [facility];  Service: Endoscopy;  Laterality: N/A;   COLONOSCOPY WITH PROPOFOL  N/A 03/14/2020   Procedure: COLONOSCOPY WITH PROPOFOL ;  Surgeon: Copping Rogelia, MD;  Location: [facility];  Service: Endoscopy;  Laterality: N/A;  priority 4   COLONOSCOPY WITH PROPOFOL  N/A 08/19/2023   Procedure: COLONOSCOPY WITH PROPOFOL ;  Surgeon: Copping Rogelia, MD;  Location: [facility];  Service: Endoscopy;  Laterality: N/A;   CONTINUOUS NERVE MONITORING N/A 04/30/2023   Procedure: CONTINUOUS NERVE MONITORING;  Surgeon: Herminio Miu, MD;  Location: [facility];  Service: ENT;  Laterality: N/A;   ESOPHAGOGASTRODUODENOSCOPY N/A 08/19/2023   Procedure: ESOPHAGOGASTRODUODENOSCOPY (EGD);  Surgeon: Copping Rogelia, MD;  Location: [facility];  Service: Endoscopy;  Laterality: N/A;   IR IMAGING GUIDED PORT INSERTION  06/01/2022   IR REMOVAL TUN ACCESS W/ PORT W/O FL MOD SED  01/17/2023   POLYPECTOMY  11/12/2016   Procedure: POLYPECTOMY;  Surgeon: Rogelia Copping, MD;  Location: [facility];  Service: Endoscopy;;   POLYPECTOMY  03/29/2017   Procedure: POLYPECTOMY;  Surgeon: Copping Rogelia, MD;  Location: [facility];  Service: Endoscopy;;   POLYPECTOMY  08/19/2023   Procedure: POLYPECTOMY;  Surgeon: Copping Rogelia, MD;  Location: [facility];  Service: Endoscopy;;   THYROIDECTOMY N/A 04/30/2023   Procedure: THYROIDECTOMY;  Surgeon: Herminio Miu, MD;  Location: [facility];  Service: ENT;  Laterality: N/A;   Social History   Socioeconomic History   Marital status: Married    Spouse name: Not on file   Number of children: Not on file   Years of education: Not on file   Highest education level: Associate degree: academic program  Occupational History   Not on file  Tobacco Use   Smoking status: Former    Current  packs/day: 0.00    Types: Cigarettes    Quit date: 10/22/1978    Years since quitting: 45.9   Smokeless tobacco: Never  Vaping Use   Vaping status: Never Used  Substance and Sexual Activity   Alcohol use: No   Drug use: No   Sexual activity: Yes    Partners: Male  Other Topics Concern   Not on file  Social History Narrative   Not on file   Social Drivers of Health   Financial Resource Strain: Low Risk  (08/05/2024)   Received from [facility]   Overall Financial Resource Strain (CARDIA)    Difficulty of Paying Living Expenses: Not very hard  Food Insecurity: No Food Insecurity (08/05/2024)   Received from [facility]   Hunger Vital Sign    Within the past 12 months, you worried that your food would run out before you got the money to buy more.: Never true    Within the past 12 months, the food you bought just didn't last and you didn't have money to get more.: Never true  Transportation Needs: No Transportation Needs (08/05/2024)   Received from [facility] - Transportation    In the past 12 months, has lack of transportation kept you from medical appointments or from getting medications?: No    Lack of Transportation (Non-Medical): No  Physical Activity:  Insufficiently Active (07/11/2024)   Exercise Vital Sign    Days of Exercise per Week: 2 days    Minutes of Exercise per Session: 20 min  Stress: Stress Concern Present (07/11/2024)   [employer] of Occupational Health - Occupational Stress Questionnaire    Feeling of Stress: To some extent  Social Connections: Socially Integrated (07/11/2024)   Social Connection and Isolation Panel    Frequency of Communication with Friends and Family: More than three times a week    Frequency of Social Gatherings with Friends and Family: More than three times a week    Attends Religious Services: More than 4 times per year    Active Member of [employer] or Organizations: Yes     Attends Engineer, Structural: More than 4 times per year    Marital Status: Married  Catering Manager Violence: Not At Risk (06/02/2024)   Humiliation, Afraid, Rape, and Kick questionnaire    Fear of Current or Ex-Partner: No    Emotionally Abused: No    Physically Abused: No    Sexually Abused: No   Family History  Problem Relation Age of Onset   Hepatitis C Mother 60       d/c at 67   Hypertension Father    Hepatitis C Brother    Breast cancer Neg Hx    Current Outpatient Medications:    celecoxib (CELEBREX) 200 MG capsule, Take 200 mg by mouth daily., Disp: , Rfl:    diazepam  (VALIUM ) 5 MG tablet, APPLY FEW DROPS OF WATER  TO TABLET PRIOR TO INSERTING 1 TABLET VAGINALLY 30-45 MINUTES PRIOR TO DILATOR EXERCISE 3-4 TIMES A WEEK AS DIRECTED, Disp: 30 tablet, Rfl: 0   levothyroxine (SYNTHROID) 88 MCG tablet, Take 88 mcg by mouth daily., Disp: , Rfl:    lidocaine  (XYLOCAINE ) 5 % ointment, Apply 1 Application topically as needed (for dilator therapy)., Disp: 30 g, Rfl: 0   losartan -hydrochlorothiazide  (HYZAAR) 100-25 MG tablet, Take 1 tablet by mouth daily., Disp: 90 tablet, Rfl: 1   Multiple Vitamins-Minerals (MULTIVITAMIN GUMMIES ADULTS PO), Take by mouth daily., Disp: , Rfl:    oxyCODONE  (ROXICODONE ) 5 MG immediate release tablet, Take 1 tablet (5 mg total) by mouth 2 (two) times daily as needed for severe pain (pain score 7-10)., Disp: 60 tablet, Rfl: 0   pregabalin  (LYRICA ) 50 MG capsule, Take 1 capsule (50 mg total) by mouth 2 (two) times daily. For neuropathic pain post chemotherapy, Disp: 60 capsule, Rfl: 0  Review of Systems General: fatigue, generalized weakness Skin: no complaints Eyes: no complaints HEENT: no complaints Breasts: no complaints Pulmonary: no complaints Cardiac: no complaints Gastrointestinal: no complaints Genitourinary/Sexual: per hpi Ob/Gyn: chronic pelvic pain. No bleeding or discharge Musculoskeletal: back pain- localizes new pain to  sacrum Hematology: no complaints Neurologic/Psych: neuropathy chronic   Objective:  Physical exam BP (!) 142/68 (BP Location: Left Arm, Patient Position: Sitting)   Pulse 68   Temp (!) 96 F (35.6 C) (Tympanic)   Ht 5' 1.75 (1.568 m)   Wt 159 lb (72.1 kg)   LMP 02/29/2020 (Approximate) Comment: Has had random spotting; hadn't had a period 1.5 yrs prior  SpO2 99%   BMI 29.32 kg/m   PERFORMANCE STATUS: 1  GENERAL: Patient is a well appearing female in no acute distress HEENT:  PERRL, neck supple with midline trachea. Thyroid  without masses NODES:  No cervical, supraclavicular, axillary, or inguinal lymphadenopathy palpated LUNGS:  Clear to auscultation bilaterally.  No wheezes or rhonchi HEART:  Regular rate  and rhythm. No murmur appreciated ABDOMEN:  Soft, nontender.  Positive, normoactive bowel sounds MSK:  No focal spinal tenderness to palpation. Full range of motion bilaterally in the upper extremities EXTREMITIES:  No peripheral edema SKIN:  Clear with no obvious rashes or skin changes. No nail dyscrasia NEURO:  Nonfocal. Well oriented.  Appropriate affect  Pelvic: Exam chaperoned by RN EGBUS: no lesions Cervix: surgically absent Vagina: no lesions, no discharge or bleeding Uterus: surgically absent BME: no palpable masses Rectovaginal: confirmatory  Lab Review N/A   Radiologic Imaging: As per HPI/interval history     Assessment:  SHAKEITHA UMBAUGH is a 63 y.o. female diagnosed with Stage IIIA (pMMR; p53wt), grade 2 endometrioid endometrial cancer.  Underwent robotic TLH/BSO SLN mapping and biopsies 03/30/22 at [facility]  Tumor measures 5 cm in greatest dimension and invades 14 of 17 mm thick myometrium (greater than 50%), cervix is uninvolved by tumor, Microcystic elongated fragmented (MELF) pattern of invasion and associated lymphovascular invasion.  Left ovary with tumor located within lymphovascular spaces, no tissue invasion identified. Left fallopian tube with  tumor within lymphovascular spaces, no tissue invasion identified. Free floating tumor located inside left fallopian tube. Left external iliac artery SLN suspicious for isolated tumor cells.     PET scan 8/23 with some questionable vaginal nodularity vs post op changes.  Completed 6 cycles of CarboTaxol chemotherapy and vaginal brachytherapy 1/24.  CT scan negative 10/23, 06/05/2023, and 05/08/24.    Vaginal exam negative for evidence of disease.   Stable adrenal findings 06/05/2023 c/w benign adrenal lesion no follow up indicated   Desired sexual function.  Vaginal atrophy and some narrowing of the vaginal introitus present.  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 Followed by endocrinology.   Hypothyroidism and hypocalcemia.  Worsening neuropathy.  Medical co-morbidities complicating care: HTN and prior abdominal surgery. There is no height or weight on file to calculate BMI.   Plan:    Problem List Items Addressed This Visit    Clinically, no evidence of disease today. CT from July 2025 performed for back pain and previously noted adrenal nodule was negative for recurrent disease.   She has new sacral pain for two months. Recommend xray to evaluate. Consider CT vs MRI if negative.   She will continue to use vaginal moisturizers and lubricants. Systemic estrogen therapy was not recommended by Dr. Elby. She feels that symptoms are stable. She will continue dilator exercises. Continue vaginal valium  prior to. She has been using 4-5 tablets of oxycodone  per month with improvement in symptoms. This is prescribed by Dr Melanee.   Neuropathy- present prior to chemotherapy but worsened post chemo.  We retried Lyrica  at her last visit.  She feels that symptoms are improved.  We will continue to alternate visits with Dr Melanee and plan to see her back in 3 months for pelvic exam. Consider  extending to every 6 months at that time.     She will follow-up with her other care providers regarding her other medical issues including screening mammograms.  The patient's diagnosis, an outline of the further diagnostic and laboratory studies which will be required, the recommendation for surgery, and alternatives were discussed with her and her accompanying family members.  All questions were answered to their satisfaction.   Angeles Isidor Elby, MD

## 2024-10-02 ENCOUNTER — Other Ambulatory Visit: Payer: Self-pay

## 2024-10-13 ENCOUNTER — Encounter: Payer: Self-pay | Admitting: Oncology

## 2024-10-15 ENCOUNTER — Other Ambulatory Visit: Payer: Self-pay

## 2024-10-19 ENCOUNTER — Telehealth: Payer: Self-pay | Admitting: Oncology

## 2024-10-19 NOTE — Telephone Encounter (Signed)
 Pt calling and states she already had labs done on 12/10 and she wants to know if she needs to come back for her lab only appt on 12/31. Please advise [phone]

## 2024-10-19 NOTE — Telephone Encounter (Signed)
Cancel labs

## 2024-10-21 ENCOUNTER — Inpatient Hospital Stay

## 2024-10-27 DIAGNOSIS — F1190 Opioid use, unspecified, uncomplicated: Secondary | ICD-10-CM

## 2024-10-27 DIAGNOSIS — G894 Chronic pain syndrome: Secondary | ICD-10-CM

## 2024-10-27 MED ORDER — OXYCODONE HCL 5 MG PO TABS: 5.0000 mg | ORAL_TABLET | Freq: Two times a day (BID) | ORAL | 0 refills | Status: DC | PRN

## 2024-10-27 NOTE — Telephone Encounter (Signed)
 Copied from CRM [id number]. Topic: Clinical - Medication Refill >> Oct 27, 2024  1:00 PM Aisha D wrote: Medication: oxyCODONE  (ROXICODONE ) 5 MG immediate release tablet  Has the patient contacted their pharmacy? Yes (Agent: If no, request that the patient contact the pharmacy for the refill. If patient does not wish to contact the pharmacy document the reason why and proceed with request.) (Agent: If yes, when and what did the pharmacy advise?)  This is the patient's preferred pharmacy:  [id number] [place], [place] - [address] AT [facility] OF [address] [place] [address] [place] [address] Phone: [phone] Fax: [phone]   Is this the correct pharmacy for this prescription? Yes If no, delete pharmacy and type the correct one.   Has the prescription been filled recently? No  Is the patient out of the medication? Yes  Has the patient been seen for an appointment in the last year OR does the patient have an upcoming appointment? Yes  Can we respond through MyChart? Yes  Agent: Please be advised that Rx refills may take up to 3 business days. We ask that you follow-up with your pharmacy.

## 2024-10-27 NOTE — Telephone Encounter (Signed)
 Last OV: 07/15/24 Pending OV: Nothing scheduled at this time Medication: Oxycodone  5mg  Directions: Take one tablet BID prn Last Refill: 07/22/24 Qty: #60 with 0 refills

## 2024-10-27 NOTE — Telephone Encounter (Signed)
 Spoke with Jaime Johnson and scheduled her a pain management appointment with Dr. Watt 11/02/2024 at 12:00 pm

## 2024-10-27 NOTE — Telephone Encounter (Signed)
 Copied from CRM [id number]. Topic: Clinical - Medication Question >> Oct 27, 2024  1:04 PM Aisha D wrote: Reason for CRM: Pt recently submitted a refill request for oxyCODONE  (ROXICODONE ) 5 MG immediate release tablet and wants to know if the dose can be increased so she won't have to call in every 2 months to submit a refill request. Pt also wants to know if she needs to have an appt scheduled to see Dr.Copland to give an update on how this medication is going. Pt would like a callback with an update regarding these concerns.

## 2024-11-01 NOTE — Progress Notes (Unsigned)
" ° ° ° °  Vy Jaime T. Daielle Melcher, MD, CAQ Sports Medicine [facility] at [facility] [address] [place], [address]  Phone: [phone]  FAX: [phone]  Jaime Johnson - 64 y.o. female  MRN [id number]  Date of Birth: September 28, 1961  Date: 11/02/2024  PCP: Watt Mirza, MD  Referral: Watt Mirza, MD  No chief complaint on file.  Subjective:   Jaime Johnson is a 64 y.o. very pleasant female patient with There is no height or weight on file to calculate BMI. who presents with the following:  Discussed the use of AI scribe software for clinical note transcription with the patient, who gave verbal consent to proceed.  Patient presents for follow-up chronic pain.  History is significant for papillary thyroid  carcinoma, endometrial carcinoma, and colon cancer. -She also has postchemotherapy neuropathy. -Chronic pain in the legs, feet, pelvis, felt to be secondary to radiation.  I originally had tried to prescribe her OxyContin , however this was denied by her insurance, and she has been taking oxycodone  5 mg, #60 monthly. History of Present Illness     Review of Systems is noted in the HPI, as appropriate  Objective:   LMP 02/29/2020 Comment: Has had random spotting; hadn't had a period 1.5 yrs prior  GEN: No acute distress; alert,appropriate. PULM: Breathing comfortably in no respiratory distress PSYCH: Normally interactive.   Laboratory and Imaging Data:  Assessment and Plan:   No diagnosis found. Assessment & Plan   Medication Management during today's office visit: No orders of the defined types were placed in this encounter.  There are no discontinued medications.  Orders placed today for conditions managed today: No orders of the defined types were placed in this encounter.   Disposition: No follow-ups on file.  [employer] speech-to-text software was used for transcription in this dictation.  Possible transcriptional errors  can occur using Animal nutritionist.   Signed,  Mirza DASEN. Jaime Beauchaine, MD   Outpatient Encounter Medications as of 11/02/2024  Medication Sig   celecoxib (CELEBREX) 200 MG capsule Take 200 mg by mouth daily.   diazepam  (VALIUM ) 5 MG tablet APPLY FEW DROPS OF WATER  TO TABLET PRIOR TO INSERTING 1 TABLET VAGINALLY 30-45 MINUTES PRIOR TO DILATOR EXERCISE 3-4 TIMES A WEEK AS DIRECTED (Patient not taking: Reported on 09/30/2024)   levothyroxine (SYNTHROID) 88 MCG tablet Take 88 mcg by mouth daily.   lidocaine  (XYLOCAINE ) 5 % ointment Apply 1 Application topically as needed (for dilator therapy).   losartan -hydrochlorothiazide  (HYZAAR) 100-25 MG tablet Take 1 tablet by mouth daily.   Multiple Vitamin (MULTI-VITAMIN) tablet Take 1 tablet by mouth daily.   Multiple Vitamins-Minerals (MULTIVITAMIN GUMMIES ADULTS PO) Take by mouth daily.   oxyCODONE  (ROXICODONE ) 5 MG immediate release tablet Take 1 tablet (5 mg total) by mouth 2 (two) times daily as needed for severe pain (pain score 7-10).   pregabalin  (LYRICA ) 50 MG capsule Take 1 capsule (50 mg total) by mouth 2 (two) times daily. For neuropathic pain post chemotherapy   topiramate (TOPAMAX) 25 MG tablet Take by mouth.   No facility-administered encounter medications on file as of 11/02/2024.   "

## 2024-11-02 ENCOUNTER — Ambulatory Visit: Admitting: Family Medicine

## 2024-11-02 ENCOUNTER — Encounter: Payer: Self-pay | Admitting: Family Medicine

## 2024-11-02 VITALS — BP 128/80 | HR 76 | Temp 98.0°F | Ht 61.75 in | Wt 160.4 lb

## 2024-11-02 DIAGNOSIS — Z85850 Personal history of malignant neoplasm of thyroid: Secondary | ICD-10-CM

## 2024-11-02 DIAGNOSIS — G894 Chronic pain syndrome: Secondary | ICD-10-CM

## 2024-11-02 DIAGNOSIS — Z8542 Personal history of malignant neoplasm of other parts of uterus: Secondary | ICD-10-CM

## 2024-11-02 DIAGNOSIS — F1190 Opioid use, unspecified, uncomplicated: Secondary | ICD-10-CM | POA: Diagnosis not present

## 2024-11-02 DIAGNOSIS — M792 Neuralgia and neuritis, unspecified: Secondary | ICD-10-CM

## 2024-11-02 DIAGNOSIS — Z85038 Personal history of other malignant neoplasm of large intestine: Secondary | ICD-10-CM | POA: Diagnosis not present

## 2024-11-02 DIAGNOSIS — R7989 Other specified abnormal findings of blood chemistry: Secondary | ICD-10-CM

## 2024-11-02 LAB — HEPATIC FUNCTION PANEL
ALT: 79 U/L — ABNORMAL HIGH (ref 3–35)
AST: 44 U/L — ABNORMAL HIGH (ref 5–37)
Albumin: 4.8 g/dL (ref 3.5–5.2)
Alkaline Phosphatase: 54 U/L (ref 39–117)
Bilirubin, Direct: 0.1 mg/dL (ref 0.1–0.3)
Total Bilirubin: 0.6 mg/dL (ref 0.2–1.2)
Total Protein: 8 g/dL (ref 6.0–8.3)

## 2024-11-02 MED ORDER — BIMATOPROST 0.03 % EX SOLN: 1.0000 [drp] | Freq: Every day | CUTANEOUS | 12 refills | Status: AC

## 2024-11-02 MED ORDER — OXYCODONE HCL 5 MG PO TABS: 5.0000 mg | ORAL_TABLET | Freq: Two times a day (BID) | ORAL | 0 refills | Status: AC | PRN

## 2024-11-02 MED ORDER — PREGABALIN 50 MG PO CAPS: 50.0000 mg | ORAL_CAPSULE | Freq: Two times a day (BID) | ORAL | 5 refills | Status: AC

## 2024-11-02 NOTE — Patient Instructions (Signed)
 Hold Celebrex (Celecoxib)

## 2024-11-06 ENCOUNTER — Ambulatory Visit: Payer: Self-pay | Admitting: Family Medicine

## 2024-11-06 NOTE — Addendum Note (Signed)
 Addended by: WATT MIRZA on: 11/06/2024 04:05 PM   Modules accepted: Orders

## 2024-11-12 ENCOUNTER — Encounter: Payer: Self-pay | Admitting: Oncology

## 2024-11-17 ENCOUNTER — Ambulatory Visit: Admission: RE | Admit: 2024-11-17 | Source: Ambulatory Visit

## 2024-11-18 ENCOUNTER — Encounter: Payer: Self-pay | Admitting: Oncology

## 2024-11-25 ENCOUNTER — Encounter: Payer: Self-pay | Admitting: Oncology

## 2024-11-26 ENCOUNTER — Ambulatory Visit
Admission: RE | Admit: 2024-11-26 | Discharge: 2024-11-26 | Disposition: A | Source: Ambulatory Visit | Attending: Family Medicine | Admitting: Family Medicine

## 2024-11-26 DIAGNOSIS — Z85038 Personal history of other malignant neoplasm of large intestine: Secondary | ICD-10-CM

## 2024-11-26 DIAGNOSIS — Z85850 Personal history of malignant neoplasm of thyroid: Secondary | ICD-10-CM

## 2024-11-26 DIAGNOSIS — Z8542 Personal history of malignant neoplasm of other parts of uterus: Secondary | ICD-10-CM

## 2024-11-26 DIAGNOSIS — R7989 Other specified abnormal findings of blood chemistry: Secondary | ICD-10-CM

## 2024-11-26 MED ORDER — GADOBUTROL 1 MMOL/ML IV SOLN
7.0000 mL | Freq: Once | INTRAVENOUS | Status: AC | PRN
  Administered 2024-11-26: 7 mL via INTRAVENOUS

## 2025-03-31 ENCOUNTER — Inpatient Hospital Stay

## 2025-06-08 ENCOUNTER — Ambulatory Visit

## 2025-10-06 ENCOUNTER — Inpatient Hospital Stay: Admitting: Oncology

## 2025-10-06 ENCOUNTER — Inpatient Hospital Stay
